# Patient Record
Sex: Female | Born: 1950
Health system: Southern US, Community
[De-identification: ages and names within clinical notes are randomized; demographics above are authoritative.]

## PROBLEM LIST (undated history)

## (undated) DIAGNOSIS — K219 Gastro-esophageal reflux disease without esophagitis: Secondary | ICD-10-CM

## (undated) DIAGNOSIS — T7840XA Allergy, unspecified, initial encounter: Secondary | ICD-10-CM

## (undated) DIAGNOSIS — L570 Actinic keratosis: Secondary | ICD-10-CM

## (undated) DIAGNOSIS — M722 Plantar fascial fibromatosis: Secondary | ICD-10-CM

## (undated) DIAGNOSIS — T8859XA Other complications of anesthesia, initial encounter: Secondary | ICD-10-CM

## (undated) DIAGNOSIS — I10 Essential (primary) hypertension: Secondary | ICD-10-CM

## (undated) DIAGNOSIS — R7303 Prediabetes: Secondary | ICD-10-CM

## (undated) DIAGNOSIS — C50919 Malignant neoplasm of unspecified site of unspecified female breast: Secondary | ICD-10-CM

## (undated) DIAGNOSIS — T4145XA Adverse effect of unspecified anesthetic, initial encounter: Secondary | ICD-10-CM

## (undated) DIAGNOSIS — J189 Pneumonia, unspecified organism: Secondary | ICD-10-CM

## (undated) DIAGNOSIS — K635 Polyp of colon: Secondary | ICD-10-CM

## (undated) DIAGNOSIS — F419 Anxiety disorder, unspecified: Secondary | ICD-10-CM

## (undated) DIAGNOSIS — K589 Irritable bowel syndrome without diarrhea: Secondary | ICD-10-CM

## (undated) DIAGNOSIS — M1990 Unspecified osteoarthritis, unspecified site: Secondary | ICD-10-CM

## (undated) DIAGNOSIS — G510 Bell's palsy: Principal | ICD-10-CM

## (undated) DIAGNOSIS — C801 Malignant (primary) neoplasm, unspecified: Secondary | ICD-10-CM

## (undated) DIAGNOSIS — R112 Nausea with vomiting, unspecified: Secondary | ICD-10-CM

## (undated) DIAGNOSIS — E785 Hyperlipidemia, unspecified: Secondary | ICD-10-CM

## (undated) DIAGNOSIS — Z9889 Other specified postprocedural states: Secondary | ICD-10-CM

## (undated) HISTORY — DX: Anxiety disorder, unspecified: F41.9

## (undated) HISTORY — DX: Gastro-esophageal reflux disease without esophagitis: K21.9

## (undated) HISTORY — PX: TENDON REPAIR: SHX5111

## (undated) HISTORY — DX: Allergy, unspecified, initial encounter: T78.40XA

## (undated) HISTORY — DX: Bell's palsy: G51.0

## (undated) HISTORY — DX: Actinic keratosis: L57.0

## (undated) HISTORY — DX: Malignant neoplasm of unspecified site of unspecified female breast: C50.919

## (undated) HISTORY — PX: TUBAL LIGATION: SHX77

## (undated) HISTORY — PX: MASTECTOMY, PARTIAL: SHX709

## (undated) HISTORY — DX: Hyperlipidemia, unspecified: E78.5

## (undated) HISTORY — DX: Malignant (primary) neoplasm, unspecified: C80.1

## (undated) HISTORY — DX: Pneumonia, unspecified organism: J18.9

## (undated) HISTORY — DX: Essential (primary) hypertension: I10

## (undated) HISTORY — PX: HERNIA REPAIR: SHX51

## (undated) HISTORY — PX: BREAST SURGERY: SHX581

## (undated) HISTORY — DX: Irritable bowel syndrome, unspecified: K58.9

## (undated) HISTORY — DX: Polyp of colon: K63.5

## (undated) HISTORY — PX: TONSILLECTOMY: SHX5217

## (undated) HISTORY — DX: Plantar fascial fibromatosis: M72.2

---
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
## 1988-11-27 HISTORY — PX: ABDOMINAL HYSTERECTOMY: SHX81

## 1999-02-04 ENCOUNTER — Encounter: Payer: Self-pay | Admitting: Neurological Surgery

## 1999-02-04 ENCOUNTER — Ambulatory Visit (HOSPITAL_COMMUNITY): Admission: RE | Admit: 1999-02-04 | Discharge: 1999-02-04 | Payer: Self-pay | Admitting: Neurological Surgery

## 1999-02-17 ENCOUNTER — Ambulatory Visit (HOSPITAL_COMMUNITY): Admission: RE | Admit: 1999-02-17 | Discharge: 1999-02-17 | Payer: Self-pay | Admitting: Neurological Surgery

## 1999-02-17 ENCOUNTER — Encounter: Payer: Self-pay | Admitting: Neurological Surgery

## 1999-03-11 ENCOUNTER — Ambulatory Visit (HOSPITAL_COMMUNITY): Admission: RE | Admit: 1999-03-11 | Discharge: 1999-03-11 | Payer: Self-pay | Admitting: Neurological Surgery

## 1999-03-11 ENCOUNTER — Encounter: Payer: Self-pay | Admitting: Neurological Surgery

## 1999-12-05 ENCOUNTER — Ambulatory Visit (HOSPITAL_COMMUNITY): Admission: RE | Admit: 1999-12-05 | Discharge: 1999-12-05 | Payer: Self-pay | Admitting: Gastroenterology

## 2000-03-06 ENCOUNTER — Ambulatory Visit (HOSPITAL_COMMUNITY): Admission: RE | Admit: 2000-03-06 | Discharge: 2000-03-06 | Payer: Self-pay | Admitting: Neurological Surgery

## 2000-03-06 ENCOUNTER — Encounter: Payer: Self-pay | Admitting: Neurological Surgery

## 2000-03-30 ENCOUNTER — Encounter: Payer: Self-pay | Admitting: Neurological Surgery

## 2000-03-30 ENCOUNTER — Ambulatory Visit (HOSPITAL_COMMUNITY): Admission: RE | Admit: 2000-03-30 | Discharge: 2000-03-30 | Payer: Self-pay | Admitting: Neurological Surgery

## 2000-04-13 ENCOUNTER — Ambulatory Visit (HOSPITAL_COMMUNITY): Admission: RE | Admit: 2000-04-13 | Discharge: 2000-04-13 | Payer: Self-pay | Admitting: Neurological Surgery

## 2000-04-13 ENCOUNTER — Encounter: Payer: Self-pay | Admitting: Neurological Surgery

## 2001-01-03 ENCOUNTER — Other Ambulatory Visit: Admission: RE | Admit: 2001-01-03 | Discharge: 2001-01-03 | Payer: Self-pay | Admitting: Obstetrics and Gynecology

## 2001-04-04 ENCOUNTER — Ambulatory Visit (HOSPITAL_COMMUNITY): Admission: RE | Admit: 2001-04-04 | Discharge: 2001-04-04 | Payer: Self-pay | Admitting: Orthopedic Surgery

## 2002-09-22 ENCOUNTER — Encounter (INDEPENDENT_AMBULATORY_CARE_PROVIDER_SITE_OTHER): Payer: Self-pay

## 2002-09-22 ENCOUNTER — Ambulatory Visit (HOSPITAL_COMMUNITY): Admission: RE | Admit: 2002-09-22 | Discharge: 2002-09-22 | Payer: Self-pay | Admitting: Orthopedic Surgery

## 2003-11-28 DIAGNOSIS — I10 Essential (primary) hypertension: Secondary | ICD-10-CM

## 2003-11-28 HISTORY — DX: Essential (primary) hypertension: I10

## 2004-02-29 ENCOUNTER — Other Ambulatory Visit: Admission: RE | Admit: 2004-02-29 | Discharge: 2004-02-29 | Payer: Self-pay | Admitting: Obstetrics and Gynecology

## 2004-12-12 ENCOUNTER — Ambulatory Visit (HOSPITAL_COMMUNITY): Admission: RE | Admit: 2004-12-12 | Discharge: 2004-12-12 | Payer: Self-pay | Admitting: Orthopedic Surgery

## 2005-05-16 ENCOUNTER — Ambulatory Visit (HOSPITAL_BASED_OUTPATIENT_CLINIC_OR_DEPARTMENT_OTHER): Admission: RE | Admit: 2005-05-16 | Discharge: 2005-05-16 | Payer: Self-pay | Admitting: Orthopedic Surgery

## 2005-05-16 ENCOUNTER — Ambulatory Visit (HOSPITAL_COMMUNITY): Admission: RE | Admit: 2005-05-16 | Discharge: 2005-05-16 | Payer: Self-pay | Admitting: Orthopedic Surgery

## 2007-04-10 ENCOUNTER — Emergency Department (HOSPITAL_COMMUNITY): Admission: EM | Admit: 2007-04-10 | Discharge: 2007-04-10 | Payer: Self-pay | Admitting: Emergency Medicine

## 2008-07-28 ENCOUNTER — Encounter (INDEPENDENT_AMBULATORY_CARE_PROVIDER_SITE_OTHER): Payer: Self-pay | Admitting: Urology

## 2008-07-28 ENCOUNTER — Ambulatory Visit (HOSPITAL_BASED_OUTPATIENT_CLINIC_OR_DEPARTMENT_OTHER): Admission: RE | Admit: 2008-07-28 | Discharge: 2008-07-28 | Payer: Self-pay | Admitting: Urology

## 2009-11-27 DIAGNOSIS — C4491 Basal cell carcinoma of skin, unspecified: Secondary | ICD-10-CM

## 2009-11-27 DIAGNOSIS — C801 Malignant (primary) neoplasm, unspecified: Secondary | ICD-10-CM

## 2009-11-27 HISTORY — DX: Malignant (primary) neoplasm, unspecified: C80.1

## 2009-11-27 HISTORY — PX: OTHER SURGICAL HISTORY: SHX169

## 2009-11-27 HISTORY — DX: Basal cell carcinoma of skin, unspecified: C44.91

## 2009-12-08 ENCOUNTER — Ambulatory Visit (HOSPITAL_BASED_OUTPATIENT_CLINIC_OR_DEPARTMENT_OTHER): Admission: RE | Admit: 2009-12-08 | Discharge: 2009-12-08 | Payer: Self-pay | Admitting: Orthopedic Surgery

## 2010-04-28 ENCOUNTER — Encounter: Admission: RE | Admit: 2010-04-28 | Discharge: 2010-04-28 | Payer: Self-pay | Admitting: Orthopedic Surgery

## 2011-02-11 LAB — POCT I-STAT 4, (NA,K, GLUC, HGB,HCT)
Hemoglobin: 15.3 g/dL — ABNORMAL HIGH (ref 12.0–15.0)
Potassium: 3.8 mEq/L (ref 3.5–5.1)
Sodium: 139 mEq/L (ref 135–145)

## 2011-04-11 NOTE — Op Note (Signed)
NAMEKIMBERLIN, [name]                ACCOUNT NO.:  [id number]   MEDICAL RECORD NO.:  [id number]          PATIENT TYPE:  AMB   LOCATION:  [facility]                         FACILITY:  [facility]   PHYSICIAN:  Jamison Neighbor, M.D.  DATE OF BIRTH:  05/20/1950   DATE OF PROCEDURE:  07/28/2008  DATE OF DISCHARGE:                               OPERATIVE REPORT   PREOPERATIVE DIAGNOSIS:  Interstitial cystitis/chronic pelvic pain.   POSTOPERATIVE DIAGNOSIS:  Interstitial cystitis/chronic pelvic pain.   PROCEDURE:  Cystoscopy, urethral calibration, hydrodistention of the  bladder, Marcaine and Pyridium installation, Marcaine and Kenalog  injection.   SURGEON:  Dr. Logan Bores.   ANESTHESIA:  Was general.   COMPLICATIONS:  None.   DRAINS:  None.   BRIEF HISTORY:  This 60 year old female presented to my office in  extreme pain, stating that she was nearly suicidal from her pain  problems.  She had been told she had interstitial cystitis, but the  diagnosis was somewhat in question because apparently she had a 1000 mL  capacity bladder on her hydrodistention.  The patient was given some  pain management as well as muscle relaxants, and it was recommended to  her that she return to psychiatrist for adjustment on her  antidepressants as we found evidence of pelvic floor dysfunction and  depression on her initial evaluation.  The patient is now to undergo  cystoscopic examination under anesthesia to see if there really is  evidence of IC and to determine the severity of the condition.  She  understands the risks and benefits of the procedure and gave full  informed consent.   PROCEDURE:  After successful induction of general anesthesia, the  patient was placed in the dorsal position, prepped with Betadine and  draped in the usual sterile fashion.  Careful bimanual examination  revealed good support for the urethra.  There was minimal cystocele.  The vault was well supported status post hysterectomy.   There was no  signs of vault prolapse or rectocele.  Urethra was calibrated to 24-  [place] with female urethral sounds without significant stenosis or  stricture.  The cystoscope was inserted.  The bladder was carefully  inspected.  No tumors or stones could be seen.  The urine coming out of  each ureter was orange tinged from Pyridium but otherwise normal.  Hydrodistention of the bladder was performed.  The bladder was distended  at a pressure of 100 cm of water for 5 minutes.  When the bladder was  drained, granulation could be seen throughout the bladder.  They were  minimal, however, and there was really no significant bleeding on the  drain-out cycle.  Bladder capacity is diminished at 650 mL, and bladder  biopsy was performed.  The biopsy site was cauterized.  This was sent  for mast cell analysis as well as to rule out carcinoma in situ.  The  bladder was drained.  A mixture of Marcaine and Pyridium was left in the  bladder.  Mixture of Marcaine and Kenalog was injected periurethrally.  The patient tolerated the procedure well and was taken to the  recovery  in good condition.  She was sent home with Tylox and doxycycline.  She  will continue on her Pyridium Plus as well as her other medications.  The patient's findings were reviewed with her and will determine whether  she would benefit from additional interstitial cystitis directed therapy  based on her response to  hydrodistention and the biopsy.  At this point, however, this appears to  be a fairly unremarkable looking bladder, and it is felt that the best  efforts might be directed towards pelvic floor management and muscle  relaxant therapy as well as chronic pain management.  True bladder  directed therapy may not be as helpful for this patient.      Jamison Neighbor, M.D.  Electronically Signed     RJE/MEDQ  D:  07/28/2008  T:  07/28/2008  Job:  [id number]

## 2011-04-14 NOTE — Op Note (Signed)
   NAME:  [name], [name]                           ACCOUNT NO.:  [id number]   MEDICAL RECORD NO.:  [id number]                   PATIENT TYPE:  AMB   LOCATION:  DAY                                  FACILITY:  [facility]   PHYSICIAN:  Marlowe Kays, MD                 DATE OF BIRTH:  May 13, 1951   DATE OF PROCEDURE:  09/22/2002  DATE OF DISCHARGE:                                 OPERATIVE REPORT   PREOPERATIVE DIAGNOSIS:  Painful mass dorsum PIP joint right long finger.   POSTOPERATIVE DIAGNOSIS:  Ganglion cyst with apparent mucinous degeneration  of extensor tendon, right long finger.   OPERATION:  Excision of ganglion cyst dorsum PIP joint right long finger  with debridement and repair of defect in extensor tendon.   SURGEON:  Marlowe Kays, M.D.   ASSISTANT:  Nurse.   ANESTHESIA:  IV regional.   PATHOLOGY AND JUSTIFICATION FOR PROCEDURE:  Mass has been present for  roughly five weeks, no known injury, it is painful. X-rays show no bony  erosion. See operative description below for additional details.   DESCRIPTION OF PROCEDURE:  Satisfactory IV regional anesthesia, duraprep to  fingers, hands and wrist, she was draped in a sterile field. I made a  slightly curved incision over the dorsal ulnar aspect of the PIP joint since  the mass was on this side. Once through the subcutaneous tissue, gelatinous  fluid consistent with a ganglion spontaneously came out. It appeared within  a little bit of a sac to the tissues in the dorsum of the extensor  mechanism. Using magnifying loops, I debrided this out and found a defect in  the tendon which appeared to be consistent with mucinous degeneration. After  both debriding and localizing the pathology, I then repaired the defect with  multiple interrupted 4-0 Vicryl. The skin and subcutaneous tissue were then  closed with interrupted 5-0 nylon mattress sutures. This incision was  infiltrated with 0.5% plain Marcaine. Betadine Adaptic dry  sterile dressing  were applied. The regional tourniquet was released. She tolerated the  procedure well and was taken to the recovery room in satisfactory condition  with no known complications.                                               Marlowe Kays, MD    JA/MEDQ  D:  09/22/2002  T:  09/22/2002  Job:  [id number]

## 2011-04-14 NOTE — Op Note (Signed)
[name], [name]                 ACCOUNT NO.:  [id number]   MEDICAL RECORD NO.:  [id number]          PATIENT TYPE:  OBV   LOCATION:  0098                         [facility]ACILITY:  [facility]   PHYSICIAN:  Marlowe Kays, M.D.  DATE OF BIRTH:  [birth year]   DATE OF PROCEDURE:  12/12/2004  DATE OF DISCHARGE:                                 OPERATIVE REPORT   PREOPERATIVE DIAGNOSIS:  Painful right little toe secondary to exuberant  fracture callus and prominent spur.   POSTOPERATIVE DIAGNOSIS:  Painful right little toe secondary to exuberant  fracture callus and prominent spur.   OPERATION:  Partial proximal and distal phalanges right little toe and  debridement of old nonabsorbable suture.   SURGEON:  Marlowe Kays, M.D.   ASSISTANT:  Nurse.   ANESTHESIA:  General.   JUSTIFICATION FOR PROCEDURE:  She has had pain and swelling over the lateral  right little toe.  This is a result of a fracture September, 2005.  X-rays  demonstrate exuberant callus and a prominent spur.  She is unable to wear  her shoes and wished to have the problem corrected.  She also has a history  of having had right toe surgery many years ago.   PROCEDURE:  After satisfactory general anesthesia, DuraPrep, the foot and  ankle was draped in a sterile field.  The leg was esmarched out after  elevation.  A dorsal lateral incision down through the capsule, the  prominent bony structures, which basically were the distal portion of the  proximal phalanx and the proximal portion of the distal phalanx, were  demarcated and excised with both sharp and small rongeur dissection until  the outer portion of the toe was flat.  Also, several large chunks of  nonabsorbable suture were removed which may have been a contributing factor  to the pain and tenderness she was having.  The wound was then irrigated  with sterile saline.  The toe was blocked with 0.5% plain Marcaine.  The  periosteal capsular complex was closed with  interrupted 4-0 Vicryl and skin  with interrupted 4-0 nylon mattress sutures.  Betadine, Adaptic, dry sterile  dressing were applied.  The tourniquet was released.  At the time of this  dictation, she was on her way to the recovery room in satisfactory condition  with no complications.      JA/MEDQ  D:  12/12/2004  T:  12/12/2004  Job:  [id number]

## 2011-04-14 NOTE — Op Note (Signed)
[facility]  Patient:    [name], [name]                        MRN: [id number] Proc. Date: 04/04/01 Adm. Date:  [id number] Attending:  Marlowe Kays Page                           Operative Report  PREOPERATIVE DIAGNOSIS:  Chronic plantar fasciitis, right heel.  POSTOPERATIVE DIAGNOSIS:  Chronic plantar fasciitis, right heel.  OPERATION PERFORMED:  Release plantar fascia, right heel.  SURGEON:  Dr. Simonne Come.  ASSISTANT:  Nurse.  ANESTHESIA:  General.  PATHOLOGY AND JUSTIFICATION FOR PROCEDURE:  Chronic and recently severe pain over the medial origin of the plantar fascia accompanied by a cavus foot, nonsurgical measures have been ineffective. Consequently she is here today for the above mentioned surgery.  DESCRIPTION OF PROCEDURE:  Satisfactory general anesthesia, pneumatic tourniquet, foot and ankle prepped with duraprep, draped in a sterile field. A 2 cm incision was made over the posterior instep slightly overlappig the os calcis. The incision was carried down through the subcutaneous tissue and the plantar fascia exposed. It was demarcated from the underlying soft tissues with a retractor and I then with sharp dissection demarcated the superior portion of the plantar fascia from the overlying muscle. With scissors I then released the plantar fascia across the width of the heel. In many areas, there was a very audible gritty noise as well as sensation indicating chronic fibrosis. In the mid portion, there appeared to have been some detachment consistent with the severe pain and tenderness in this area. Once I felt I had released the fascia across the heel both with direct visualization and by inserting my little finger and feeling, the wound was irrigated with sterile saline and soft tissue was infiltrated with 0.5% plain Marcaine. The subcutaneous tissue was reapproximated with interrupted 3-0 Vicryl and the skin was interrupted 4-0 nylon  mattress sutures. Betadine Adaptic dry sterile dressing were applied, tourniquet was released. At the time of this dictation she was doing well and was on her way to the recovery room in satisfactory condition with no known complications. DD:  04/04/01 TD:  04/04/01 Job: [id number]

## 2011-04-14 NOTE — Op Note (Signed)
[name], [name]                 ACCOUNT NO.:  [id number]   MEDICAL RECORD NO.:  [id number]          PATIENT TYPE:  AMB   LOCATION:  [facility]                         [facility]ACILITY:  [facility]   PHYSICIAN:  Marlowe Kays, M.D.  DATE OF BIRTH:  [birth year]   DATE OF PROCEDURE:  05/16/2005  DATE OF DISCHARGE:                                 OPERATIVE REPORT   PREOPERATIVE DIAGNOSIS:  Painful callus distal portion, right little toe.   POSTOPERATIVE DIAGNOSIS:  Painful callus distal portion, right little toe.   OPERATION:  Partial distal phalangectomy right little toe.   SURGEON:  Dr. Simonne Come   ASSISTANT:  Nurse.   ANESTHESIA:  Local MAC.   PATHOLOGY AND JUSTIFICATION FOR PROCEDURE:  I had previously performed a  partial phalangectomy of mainly her proximal phalanx.  This was done well,  but distal and dorsal she now has a callus with pain there as well, and she  is here today for removal of additional bone distal to the prior surgical  area.   DESCRIPTION OF PROCEDURE:  MAC anesthesia with DuraPrep to foot and ankle,  was draped in a sterile field.  I blocked her toe and worked locally with  0.5% plain Marcaine.  I marked out a lateral incision distal to the prior  incision, working my way down to the underlying bony prominence which was  basically the base of the distal phalanx.  I dissected this out dorsally and  inferiorly and a small rongeurs removed sufficient bone that the phalanx was  now flat with no bony projections.  The wound was irrigated with sterile  saline.  I closed the periosteal fascial complex with interrupted 4-0 Vicryl  and skin with interrupted 4-0 nylon mattress sutures.  Betadine, Adaptic dry  sterile dressing were applied.  She tolerated the procedure well and was  taken to the recovery room in satisfactory condition with no known  complications.       JA/MEDQ  D:  05/16/2005  T:  05/16/2005  Job:  [id number]

## 2011-04-14 NOTE — Procedures (Signed)
[facility]  Patient:    [name]                        MRN: [id number] Proc. Date: 12/05/99 Adm. Date:  [id number] Attending:  Nelda Marseille CC:         Petra Kuba, M.D.  (Office)             Roxy Manns, M.D. [facility]             Sherry A. Rosalio Macadamia, M.D.                           Procedure Report  NAME OF PROCEDURE: Colonoscopy.  ENDOSCOPIST: Petra Kuba, M.D.  INDICATIONS: Patient with a personal history of colon polyps here for repeat screening.  CONSENT:  Consent was signed after risks, benefits, methods and options were thoroughly discussed multiple times in the past.  MEDICINES USED:  Demerol 150 mg and Versed 15 mg.  DESCRIPTION OF PROCEDURE: Rectal inspection was pertinent for small external hemorrhoids.  Digital examination is negative.  The pediatric video colonoscope was inserted and easily advanced around the colon to the cecum.  This did require some left lower quadrant pressure, but no position changes.  The cecum was identified by the appendiceal orifice and the ileocecal  valve.  Prep on the right side was fairly adequate.  The rest of the prep was adequate.  We did have to perform some washing and suctioning to remove the stool that was on the wall of the right colon.  On insertion of the instrument there were left-sided diverticula were noted. No other abnormalities were seen.  The scope was slowly withdrawn.  On slow withdrawal through the colon no polypoid lesions, masses or other abnormalities, but the left-sided diverticula were seen.  This was slowly withdrawn to the rectum. Once back in the rectum the scope was retroflexed pertinent for some internal hemorrhoids.  Anorectal pull through confirmed the hemorrhoids.  The scope was reinserted to probably the sigmoid descending junction.  Air was suctioned.  The scope was removed.  The patient tolerated the procedure well.  There were no obvious  immediate complications.  ENDOSCOPIC DIAGNOSES: 1. Internal and external hemorrhoids, small. 2. Early left-sided diverticula. 3. Otherwise within normal limits to the cecum.  PLAN: 1. Yearly rectal examination per either Dr. Milinda Antis or Dr. Rosalio Macadamia. 2. I would be happy to see back soon p.r.n., but otherwise will probably    recheck colonoscopy in five years. DD:  12/05/99 TD:  12/05/99 Job: [id number]

## 2011-05-11 ENCOUNTER — Other Ambulatory Visit (HOSPITAL_COMMUNITY): Payer: Self-pay | Admitting: Orthopedic Surgery

## 2011-05-11 DIAGNOSIS — M79671 Pain in right foot: Secondary | ICD-10-CM

## 2011-05-11 DIAGNOSIS — M84376A Stress fracture, unspecified foot, initial encounter for fracture: Secondary | ICD-10-CM

## 2011-05-18 ENCOUNTER — Other Ambulatory Visit (HOSPITAL_COMMUNITY): Payer: Self-pay

## 2011-05-18 ENCOUNTER — Encounter (HOSPITAL_COMMUNITY): Payer: Self-pay

## 2013-11-10 ENCOUNTER — Encounter: Payer: Self-pay | Admitting: Emergency Medicine

## 2013-11-10 ENCOUNTER — Ambulatory Visit (INDEPENDENT_AMBULATORY_CARE_PROVIDER_SITE_OTHER): Payer: BC Managed Care – PPO | Admitting: Emergency Medicine

## 2013-11-10 VITALS — BP 136/94 | HR 88 | Temp 98.0°F | Resp 18 | Wt 174.0 lb

## 2013-11-10 DIAGNOSIS — J329 Chronic sinusitis, unspecified: Secondary | ICD-10-CM

## 2013-11-10 DIAGNOSIS — R05 Cough: Secondary | ICD-10-CM

## 2013-11-10 DIAGNOSIS — I10 Essential (primary) hypertension: Secondary | ICD-10-CM | POA: Insufficient documentation

## 2013-11-10 LAB — CBC WITH DIFFERENTIAL/PLATELET
Basophils Relative: 1 % (ref 0–1)
Eosinophils Absolute: 0.3 10*3/uL (ref 0.0–0.7)
Eosinophils Relative: 3 % (ref 0–5)
Hemoglobin: 15.2 g/dL — ABNORMAL HIGH (ref 12.0–15.0)
MCH: 30.3 pg (ref 26.0–34.0)
MCHC: 35.6 g/dL (ref 30.0–36.0)
MCV: 85.2 fL (ref 78.0–100.0)
Monocytes Absolute: 0.5 10*3/uL (ref 0.1–1.0)
Monocytes Relative: 6 % (ref 3–12)
Neutrophils Relative %: 66 % (ref 43–77)

## 2013-11-10 LAB — BASIC METABOLIC PANEL WITH GFR
BUN: 16 mg/dL (ref 6–23)
Calcium: 10 mg/dL (ref 8.4–10.5)
Creat: 0.86 mg/dL (ref 0.50–1.10)
GFR, Est African American: 84 mL/min
Glucose, Bld: 95 mg/dL (ref 70–99)
Sodium: 134 mEq/L — ABNORMAL LOW (ref 135–145)

## 2013-11-10 MED ORDER — METHYLPREDNISOLONE (PAK) 4 MG PO TABS: 4.0000 mg | ORAL_TABLET | Freq: Every day | ORAL | Status: DC

## 2013-11-10 MED ORDER — AZITHROMYCIN 250 MG PO TABS: ORAL_TABLET | ORAL | Status: DC

## 2013-11-10 MED ORDER — BENZONATATE 100 MG PO CAPS: 100.0000 mg | ORAL_CAPSULE | Freq: Three times a day (TID) | ORAL | Status: DC | PRN

## 2013-11-10 MED ORDER — ALBUTEROL SULFATE HFA 108 (90 BASE) MCG/ACT IN AERS: 2.0000 | INHALATION_SPRAY | Freq: Four times a day (QID) | RESPIRATORY_TRACT | Status: DC | PRN

## 2013-11-10 MED ORDER — DEXAMETHASONE SODIUM PHOSPHATE 100 MG/10ML IJ SOLN
10.0000 mg | Freq: Once | INTRAMUSCULAR | Status: AC
  Administered 2013-11-10: 10 mg via INTRAMUSCULAR

## 2013-11-10 NOTE — Patient Instructions (Signed)
Sinusitis Sinusitis is redness, soreness, and puffiness (inflammation) of the air pockets in the bones of your face (sinuses). The redness, soreness, and puffiness can cause air and mucus to get trapped in your sinuses. This can allow germs to grow and cause an infection.  HOME CARE   Drink enough fluids to keep your pee (urine) clear or pale yellow.  Use a humidifier in your home.  Run a hot shower to create steam in the bathroom. Sit in the bathroom with the door closed. Breathe in the steam 3 4 times a day.  Put a warm, moist washcloth on your face 3 4 times a day, or as told by your doctor.  Use salt water sprays (saline sprays) to wet the thick fluid in your nose. This can help the sinuses drain.  Only take medicine as told by your doctor. GET HELP RIGHT AWAY IF:   Your pain gets worse.  You have very bad headaches.  You are sick to your stomach (nauseous).  You throw up (vomit).  You are very sleepy (drowsy) all the time.  Your face is puffy (swollen).  Your vision changes.  You have a stiff neck.  You have trouble breathing. MAKE SURE YOU:   Understand these instructions.  Will watch your condition.  Will get help right away if you are not doing well or get worse. Document Released: 05/01/2008 Document Revised: 08/07/2012 Document Reviewed: 06/18/2012 [facility] Patient Information 2014 [place], [place]. Bronchitis Bronchitis is a problem of the air tubes leading to your lungs. This problem makes it hard for air to get in and out of the lungs. You may cough a lot because your air tubes are narrow. Going without care can cause lasting (chronic) bronchitis. HOME CARE   Drink enough fluids to keep your pee (urine) clear or pale yellow.  Use a cool mist humidifier.  Quit smoking if you smoke. If you keep smoking, the bronchitis might not get better.  Only take medicine as told by your doctor. GET HELP RIGHT AWAY IF:   Coughing keeps you awake.  You start to  wheeze.  You become more sick or weak.  You have a hard time breathing or get short of breath.  You cough up blood.  Coughing lasts more than 2 weeks.  You have a fever.  Your baby is older than 3 months with a rectal temperature of 102 F (38.9 C) or higher.  Your baby is 51 months old or younger with a rectal temperature of 100.4 F (38 C) or higher. MAKE SURE YOU:  Understand these instructions.  Will watch your condition.  Will get help right away if you are not doing well or get worse. Document Released: 05/01/2008 Document Revised: 02/05/2012 Document Reviewed: 07/08/2013 [facility] Patient Information 2014 [place], [place].

## 2013-11-11 ENCOUNTER — Encounter: Payer: Self-pay | Admitting: Emergency Medicine

## 2013-11-11 ENCOUNTER — Telehealth: Payer: Self-pay | Admitting: *Deleted

## 2013-11-11 MED ORDER — TRIAMTERENE-HCTZ 37.5-25 MG PO TABS: 1.0000 | ORAL_TABLET | Freq: Every day | ORAL | Status: DC

## 2013-11-11 NOTE — Telephone Encounter (Signed)
Patient called requesting to be out of work another day d/t cold symptoms, cough and fatigue.  Per Loree Fee, PA-C, ok to fax out of work note to [phone] Attn: Art Buff, for patient to return to work 11/13/13.  Patient aware of lab results and instructions.

## 2013-11-11 NOTE — Telephone Encounter (Signed)
Message copied by Nicholaus Corolla A on Tue Nov 11, 2013 10:57 AM ------      Message from: [place], [place] R      Created: Tue Nov 11, 2013  2:51 AM       Sodium chloride a little low may be from BP prescription try to decrease by 1/2 and call if BP > 130/80. Rest ok recheck at 01/13/14 ------

## 2013-11-11 NOTE — Progress Notes (Signed)
   Subjective:    Patient ID: [name], female    DOB: 12-21-1950, 62 y.o.   MRN: [id number]  HPI Comments: 62 yo female over due for HTN evaluation. She notes BP good at home. She has been getting RX filled at [facility]. She exercise with keeping active.  She has had increasing cough x 10 days and now has head congestion and production of color. She denies relief with OTC.  Sore Throat  Associated symptoms include congestion and coughing.    MEDICATIONS ESTRADIOL .025 MAXZIDE 37.5/25  Review of patient's allergies indicates no known allergies.  No past medical history on file.   Review of Systems  HENT: Positive for congestion, postnasal drip and sinus pressure.   Respiratory: Positive for cough.   All other systems reviewed and are negative.   BP 136/94  Pulse 88  Temp(Src) 98 F (36.7 C) (Temporal)  Resp 18  Wt 174 lb (78.926 kg)     Objective:   Physical Exam  Nursing note and vitals reviewed. Constitutional: She is oriented to person, place, and time. She appears well-developed and well-nourished. No distress.  HENT:  Head: Normocephalic and atraumatic.  Right Ear: External ear normal.  Left Ear: External ear normal.  Nose: Nose normal.  Mouth/Throat: Oropharynx is clear and moist. No oropharyngeal exudate.  TMs injected, maxillary tenderness  Eyes: Conjunctivae and EOM are normal.  Neck: Normal range of motion. Neck supple. No JVD present. No thyromegaly present.  Cardiovascular: Normal rate, regular rhythm, normal heart sounds and intact distal pulses.   Pulmonary/Chest: Effort normal and breath sounds normal.  Tight barky spasmatic cough  Abdominal: Soft. Bowel sounds are normal. She exhibits no distension and no mass. There is no tenderness. There is no rebound and no guarding.  Musculoskeletal: Normal range of motion. She exhibits no edema and no tenderness.  Lymphadenopathy:    She has no cervical adenopathy.  Neurological: She is alert and oriented to  person, place, and time. No cranial nerve deficit.  Skin: Skin is warm and dry. No rash noted. No erythema. No pallor.  Psychiatric: She has a normal mood and affect. Her behavior is normal. Judgment and thought content normal.          Assessment & Plan:  1. Sinusitis, Cugh, Allergic Rhinitis- Zpak, Proventil, Tessalon perles, Dexamethasone 10 mg ALL AD, if no change add MEdrol DP 4mg . Add Allegra OTC, increase H2o. 2. HTN- check labs refill RX, Needs CPE.

## 2014-01-13 ENCOUNTER — Encounter: Payer: Self-pay | Admitting: Emergency Medicine

## 2014-02-05 ENCOUNTER — Encounter: Payer: Self-pay | Admitting: Emergency Medicine

## 2014-03-09 DIAGNOSIS — Z9109 Other allergy status, other than to drugs and biological substances: Secondary | ICD-10-CM | POA: Insufficient documentation

## 2014-03-09 DIAGNOSIS — M722 Plantar fascial fibromatosis: Secondary | ICD-10-CM | POA: Insufficient documentation

## 2014-03-09 DIAGNOSIS — E785 Hyperlipidemia, unspecified: Secondary | ICD-10-CM | POA: Insufficient documentation

## 2014-03-09 DIAGNOSIS — F419 Anxiety disorder, unspecified: Secondary | ICD-10-CM | POA: Insufficient documentation

## 2014-03-10 ENCOUNTER — Ambulatory Visit (INDEPENDENT_AMBULATORY_CARE_PROVIDER_SITE_OTHER): Payer: BC Managed Care – PPO | Admitting: Emergency Medicine

## 2014-03-10 ENCOUNTER — Encounter: Payer: Self-pay | Admitting: Emergency Medicine

## 2014-03-10 VITALS — BP 150/84 | HR 100 | Temp 98.2°F | Resp 18 | Ht 68.0 in | Wt 170.0 lb

## 2014-03-10 DIAGNOSIS — I10 Essential (primary) hypertension: Secondary | ICD-10-CM

## 2014-03-10 DIAGNOSIS — S91209A Unspecified open wound of unspecified toe(s) with damage to nail, initial encounter: Secondary | ICD-10-CM

## 2014-03-10 MED ORDER — TRIAMTERENE-HCTZ 37.5-25 MG PO TABS: 1.0000 | ORAL_TABLET | Freq: Every day | ORAL | Status: DC

## 2014-03-10 NOTE — Patient Instructions (Signed)

## 2014-03-10 NOTE — Progress Notes (Signed)
   Subjective:    Patient ID: [name], female    DOB: December 10, 1950, 63 y.o.   MRN: [id number]  HPI Comments: 64 yo female noticed on Saturday when removed polish Left 3rd toe nail was raised  and discolored. She then noticed nail was broken. She denies trauma or previous fungus. She denies discoloration or thickening of any other nails. She was rushed for appointment and thinks that may have contributed to BP elevation. She notes it has been good at home.    Current Outpatient Prescriptions on File Prior to Visit  Medication Sig Dispense Refill  . estradiol (CLIMARA - DOSED IN MG/24 HR) 0.025 mg/24hr patch Place 0.025 mg onto the skin once a week.      . triamterene-hydrochlorothiazide (MAXZIDE-25) 37.5-25 MG per tablet Take 1 tablet by mouth daily.  30 tablet  3   No current facility-administered medications on file prior to visit.   No Known Allergies Past Medical History  Diagnosis Date  . Hypertension   . Hyperlipidemia   . Allergy   . Anxiety   . Plantar fasciitis      Review of Systems  Skin: Positive for color change.  All other systems reviewed and are negative.  BP 150/84  Pulse 100  Temp(Src) 98.2 F (36.8 C) (Temporal)  Resp 18  Ht 5\' 8"  (1.727 m)  Wt 170 lb (77.111 kg)  BMI 25.85 kg/m2 Recheck 140/84    Objective:   Physical Exam  Nursing note and vitals reviewed. Constitutional: She is oriented to person, place, and time. She appears well-developed and well-nourished.  HENT:  Head: Normocephalic and atraumatic.  Eyes: Conjunctivae are normal.  Neck: Normal range of motion.  Cardiovascular: Normal rate, regular rhythm, normal heart sounds and intact distal pulses.   Pulmonary/Chest: Effort normal and breath sounds normal.  Abdominal: Soft. Bowel sounds are normal. There is no tenderness.  Musculoskeletal: Normal range of motion.  Neurological: She is alert and oriented to person, place, and time.  Skin: Skin is warm and dry.  All nails with mild  staining from polish. Left 3 rd nail broken in half at mid to distal portion. No thickening or discoloration present.   Psychiatric: She has a normal mood and affect. Judgment normal.          Assessment & Plan:  1. Probable nail trauma vs fungus- Broken nail removed in office, w/c if any symptom of fungus occurs after explanation given for RX topical. Advised epsom salt soaks and vicks vapor rub x 2 weeks for prophylaxis  2. HTN- Check BP call if >130/80, increase cardio

## 2014-03-20 ENCOUNTER — Encounter: Payer: Self-pay | Admitting: Emergency Medicine

## 2014-04-29 ENCOUNTER — Encounter: Payer: Self-pay | Admitting: Emergency Medicine

## 2014-05-14 ENCOUNTER — Encounter: Payer: Self-pay | Admitting: Emergency Medicine

## 2014-05-14 ENCOUNTER — Ambulatory Visit (INDEPENDENT_AMBULATORY_CARE_PROVIDER_SITE_OTHER): Payer: BC Managed Care – PPO | Admitting: Emergency Medicine

## 2014-05-14 VITALS — BP 138/86 | HR 84 | Temp 98.6°F | Resp 18 | Ht 67.0 in | Wt 165.0 lb

## 2014-05-14 DIAGNOSIS — Z23 Encounter for immunization: Secondary | ICD-10-CM

## 2014-05-14 DIAGNOSIS — I10 Essential (primary) hypertension: Secondary | ICD-10-CM

## 2014-05-14 DIAGNOSIS — Z111 Encounter for screening for respiratory tuberculosis: Secondary | ICD-10-CM

## 2014-05-14 DIAGNOSIS — Z1212 Encounter for screening for malignant neoplasm of rectum: Secondary | ICD-10-CM

## 2014-05-14 DIAGNOSIS — G479 Sleep disorder, unspecified: Secondary | ICD-10-CM

## 2014-05-14 DIAGNOSIS — E782 Mixed hyperlipidemia: Secondary | ICD-10-CM

## 2014-05-14 DIAGNOSIS — Z0000 Encounter for general adult medical examination without abnormal findings: Secondary | ICD-10-CM

## 2014-05-14 DIAGNOSIS — K5900 Constipation, unspecified: Secondary | ICD-10-CM

## 2014-05-14 LAB — CBC WITH DIFFERENTIAL/PLATELET
BASOS PCT: 1 % (ref 0–1)
Basophils Absolute: 0.1 10*3/uL (ref 0.0–0.1)
EOS ABS: 0.2 10*3/uL (ref 0.0–0.7)
Eosinophils Relative: 4 % (ref 0–5)
HCT: 42.5 % (ref 36.0–46.0)
Hemoglobin: 15.5 g/dL — ABNORMAL HIGH (ref 12.0–15.0)
Lymphocytes Relative: 41 % (ref 12–46)
Lymphs Abs: 2.2 10*3/uL (ref 0.7–4.0)
MCH: 30.5 pg (ref 26.0–34.0)
MCHC: 36.5 g/dL — AB (ref 30.0–36.0)
MCV: 83.5 fL (ref 78.0–100.0)
MONOS PCT: 5 % (ref 3–12)
Monocytes Absolute: 0.3 10*3/uL (ref 0.1–1.0)
NEUTROS PCT: 49 % (ref 43–77)
Neutro Abs: 2.6 10*3/uL (ref 1.7–7.7)
Platelets: 171 10*3/uL (ref 150–400)
RBC: 5.09 MIL/uL (ref 3.87–5.11)
RDW: 14.1 % (ref 11.5–15.5)
WBC: 5.4 10*3/uL (ref 4.0–10.5)

## 2014-05-14 LAB — HEMOGLOBIN A1C
HEMOGLOBIN A1C: 5.7 % — AB (ref <5.7)
Mean Plasma Glucose: 117 mg/dL — ABNORMAL HIGH (ref <117)

## 2014-05-14 MED ORDER — ALPRAZOLAM 0.25 MG PO TABS: 0.2500 mg | ORAL_TABLET | Freq: Every evening | ORAL | Status: AC | PRN

## 2014-05-14 NOTE — Patient Instructions (Signed)

## 2014-05-14 NOTE — Progress Notes (Signed)
Subjective:    Patient ID: [name], female    DOB: 1951-05-02, 63 y.o.   MRN: [id number]  HPI Comments: 63 yo CPE and presents for F/U for HTN, Cholesterol, Pre-Dm, D. Deficient. She is doing well overall. She is trying to improve diet and exercise. She notes BP good at home.  She notes she does not sleep well with mind spinning. She is under mild stress but is dealing with it without RX. She has not tried any OTC. She is staying fatigued due sleeping spurts.  She has been having some difficulty with constipation and thinks she may have rectocele. She notes occasionally has to put thumb in vagina to help with BM. She denies pain/ blood with BM.  LABS 09/2013 ABN TG 239 HDL 34 A1c 5.9  Hypertension     Medication List       This list is accurate as of: 05/14/14 11:59 PM.  Always use your most recent med list.               ALPRAZolam 0.25 MG tablet  Commonly known as:  XANAX  Take 1 tablet (0.25 mg total) by mouth at bedtime as needed for anxiety.     estradiol 0.025 mg/24hr patch  Commonly known as:  CLIMARA - Dosed in mg/24 hr  Place 0.025 mg onto the skin once a week.     triamterene-hydrochlorothiazide 37.5-25 MG per tablet  Commonly known as:  MAXZIDE-25  Take 1 tablet by mouth daily.       No Known Allergies  Past Medical History  Diagnosis Date  . Hyperlipidemia   . Allergy   . Anxiety   . Plantar fasciitis   . Hypertension 2005  . Cancer 2011    [facility]   Past Surgical History  Procedure Laterality Date  . Abdominal hysterectomy  1990  . Tonsillectomy    . Tendon repair      plantar fasciitis  . Basal cell carcinoma  2011    nose   History  Substance Use Topics  . Smoking status: Never Smoker   . Smokeless tobacco: Not on file  . Alcohol Use: Not on file   Family History  Problem Relation Age of Onset  . Hypertension Mother   . Heart attack Mother   . Hyperlipidemia Mother   . Heart disease Mother 37    fatal MI  . Stroke Mother   .  Arthritis Mother     rheumatoid  . Cancer Father     lung  . Hypertension Brother   . Cancer Brother     throat, leukemia  . Parkinson's disease Brother     MAINTENANCE: Colonoscopy:2011 Mammo:09/08/13 @ [facility] WNL BMD: 2010 WNL Pap/ Pelvic:Hyst 2010 WNL EYE:WNL 2015 with contacts Dentist: Q 6 month  IMMUNIZATIONS: Tdap: Pneumovax: Zostavax: 2015 Influenza: allergy   Patient Care Team: Unk Pinto, MD as PCP - General (Internal Medicine) Avon Gully, NP as Nurse Practitioner (Obstetrics and Gynecology) Jeryl Columbia, MD as Consulting Physician (Gastroenterology) Magnus Sinning, MD as Consulting Physician (Orthopedic Surgery) Cindee Salt, MD as Consulting Physician (Physical Medicine and Rehabilitation) Jari Pigg, MD as Consulting Physician (Dermatology) [place])  Review of Systems  Constitutional: Positive for fatigue.  Gastrointestinal: Positive for constipation.  Psychiatric/Behavioral: Positive for sleep disturbance.  All other systems reviewed and are negative.  BP 138/86  Pulse 84  Temp(Src) 98.6 F (37 C) (Temporal)  Resp 18  Ht 5\' 7"  (1.702 m)  Wt 165  lb (74.844 kg)  BMI 25.84 kg/m2     Objective:   Physical Exam  Nursing note and vitals reviewed. Constitutional: She is oriented to person, place, and time. She appears well-developed and well-nourished. No distress.  HENT:  Head: Normocephalic and atraumatic.  Right Ear: External ear normal.  Left Ear: External ear normal.  Nose: Nose normal.  Mouth/Throat: Oropharynx is clear and moist. No oropharyngeal exudate.  Eyes: Conjunctivae and EOM are normal. Pupils are equal, round, and reactive to light. Right eye exhibits no discharge. Left eye exhibits no discharge. No scleral icterus.  Neck: Normal range of motion. Neck supple. No JVD present. No tracheal deviation present. No thyromegaly present.  Cardiovascular: Normal rate, regular rhythm, normal heart sounds and intact distal  pulses.   Pulmonary/Chest: Effort normal and breath sounds normal.  Abdominal: Soft. Bowel sounds are normal. She exhibits no distension and no mass. There is no tenderness. There is no rebound and no guarding.  Genitourinary:  Breasts WNL, PAP/ Manual deferred  Musculoskeletal: Normal range of motion. She exhibits no edema and no tenderness.  Lymphadenopathy:    She has no cervical adenopathy.  Neurological: She is alert and oriented to person, place, and time. She has normal reflexes. No cranial nerve deficit. She exhibits normal muscle tone. Coordination normal.  Skin: Skin is warm and dry. No rash noted. No erythema. No pallor.  Psychiatric: She has a normal mood and affect. Her behavior is normal. Judgment and thought content normal.      AORTA SCAN WNL EKG NSCSPT     Assessment & Plan:  1. CPE- Update screening labs/ History/ Immunizations/ Testing as needed. Advised healthy diet, QD exercise, increase H20 and continue RX/ Vitamins AD.  2. 3 month F/U for HTN, Cholesterol, Pre-Dm, D. Deficient. Needs healthy diet, cardio QD and obtain healthy weight. Check Labs, Check BP if >130/80 call office   3. Fatigue vs ? Sleep disturbance vs sleep apnea- Trial of xanax at [facility] to see if improves, w/c with results, check labs.Discussed sleep hygiene.  4. Constipation/ rectocele- Advise of increased fiber diet and need for evaluation at GYN to determine if further treatment surgically is warranted. She has appointment.

## 2014-05-15 LAB — URINALYSIS, ROUTINE W REFLEX MICROSCOPIC
Bilirubin Urine: NEGATIVE
Glucose, UA: NEGATIVE mg/dL
Hgb urine dipstick: NEGATIVE
Ketones, ur: NEGATIVE mg/dL
LEUKOCYTES UA: NEGATIVE
NITRITE: NEGATIVE
PH: 7.5 (ref 5.0–8.0)
Protein, ur: NEGATIVE mg/dL
SPECIFIC GRAVITY, URINE: 1.019 (ref 1.005–1.030)
Urobilinogen, UA: 0.2 mg/dL (ref 0.0–1.0)

## 2014-05-15 LAB — LIPID PANEL
CHOL/HDL RATIO: 5.8 ratio
Cholesterol: 210 mg/dL — ABNORMAL HIGH (ref 0–200)
HDL: 36 mg/dL — AB (ref >39)
Triglycerides: 481 mg/dL — ABNORMAL HIGH (ref <150)

## 2014-05-15 LAB — IRON AND TIBC
%SAT: 21 % (ref 20–55)
Iron: 83 ug/dL (ref 42–145)
TIBC: 398 ug/dL (ref 250–470)
UIBC: 315 ug/dL (ref 125–400)

## 2014-05-15 LAB — BASIC METABOLIC PANEL WITH GFR
BUN: 15 mg/dL (ref 6–23)
CALCIUM: 9.4 mg/dL (ref 8.4–10.5)
CO2: 28 mEq/L (ref 19–32)
Chloride: 99 mEq/L (ref 96–112)
Creat: 0.8 mg/dL (ref 0.50–1.10)
GFR, EST NON AFRICAN AMERICAN: 79 mL/min
GLUCOSE: 94 mg/dL (ref 70–99)
Potassium: 3.5 mEq/L (ref 3.5–5.3)
SODIUM: 139 meq/L (ref 135–145)

## 2014-05-15 LAB — MAGNESIUM: Magnesium: 1.9 mg/dL (ref 1.5–2.5)

## 2014-05-15 LAB — TSH: TSH: 1.083 u[IU]/mL (ref 0.350–4.500)

## 2014-05-15 LAB — HEPATIC FUNCTION PANEL
ALK PHOS: 74 U/L (ref 39–117)
ALT: 14 U/L (ref 0–35)
AST: 17 U/L (ref 0–37)
Albumin: 4.4 g/dL (ref 3.5–5.2)
BILIRUBIN DIRECT: 0.1 mg/dL (ref 0.0–0.3)
BILIRUBIN INDIRECT: 0.7 mg/dL (ref 0.2–1.2)
BILIRUBIN TOTAL: 0.8 mg/dL (ref 0.2–1.2)
Total Protein: 7.5 g/dL (ref 6.0–8.3)

## 2014-05-15 LAB — FOLATE RBC: RBC Folate: 196 ng/mL — ABNORMAL LOW (ref >280)

## 2014-05-15 LAB — VITAMIN B12: Vitamin B-12: 245 pg/mL (ref 211–911)

## 2014-05-15 LAB — INSULIN, FASTING: INSULIN FASTING, SERUM: 14 u[IU]/mL (ref 3–28)

## 2014-05-15 LAB — VITAMIN D 25 HYDROXY (VIT D DEFICIENCY, FRACTURES): Vit D, 25-Hydroxy: 31 ng/mL (ref 30–89)

## 2014-05-15 LAB — MICROALBUMIN / CREATININE URINE RATIO
CREATININE, URINE: 101.6 mg/dL
MICROALB UR: 0.5 mg/dL (ref 0.00–1.89)
MICROALB/CREAT RATIO: 4.9 mg/g (ref 0.0–30.0)

## 2014-05-18 LAB — TB SKIN TEST
INDURATION: 0 mm
TB Skin Test: NEGATIVE

## 2014-05-19 ENCOUNTER — Encounter: Payer: Self-pay | Admitting: Emergency Medicine

## 2014-06-15 ENCOUNTER — Encounter: Payer: Self-pay | Admitting: Emergency Medicine

## 2014-10-17 ENCOUNTER — Encounter: Payer: Self-pay | Admitting: *Deleted

## 2014-11-23 ENCOUNTER — Encounter: Payer: Self-pay | Admitting: Physician Assistant

## 2014-11-23 ENCOUNTER — Ambulatory Visit (INDEPENDENT_AMBULATORY_CARE_PROVIDER_SITE_OTHER): Payer: BC Managed Care – PPO | Admitting: Physician Assistant

## 2014-11-23 VITALS — BP 152/98 | HR 82 | Temp 97.8°F | Resp 18 | Ht 67.0 in | Wt 166.0 lb

## 2014-11-23 DIAGNOSIS — R110 Nausea: Secondary | ICD-10-CM

## 2014-11-23 DIAGNOSIS — M791 Myalgia, unspecified site: Secondary | ICD-10-CM

## 2014-11-23 DIAGNOSIS — J0100 Acute maxillary sinusitis, unspecified: Secondary | ICD-10-CM

## 2014-11-23 DIAGNOSIS — I10 Essential (primary) hypertension: Secondary | ICD-10-CM

## 2014-11-23 LAB — POCT INFLUENZA A/B: INFLUENZA A, POC: NEGATIVE

## 2014-11-23 MED ORDER — DEXAMETHASONE SODIUM PHOSPHATE 100 MG/10ML IJ SOLN
10.0000 mg | Freq: Once | INTRAMUSCULAR | Status: AC
  Administered 2014-11-23: 10 mg via INTRAMUSCULAR

## 2014-11-23 MED ORDER — TRIAMTERENE-HCTZ 37.5-25 MG PO TABS: 1.0000 | ORAL_TABLET | Freq: Every day | ORAL | Status: DC

## 2014-11-23 MED ORDER — LEVOFLOXACIN 500 MG PO TABS: 500.0000 mg | ORAL_TABLET | Freq: Every day | ORAL | Status: DC

## 2014-11-23 MED ORDER — ONDANSETRON 8 MG PO TBDP: 8.0000 mg | ORAL_TABLET | Freq: Three times a day (TID) | ORAL | Status: DC | PRN

## 2014-11-23 MED ORDER — BENZONATATE 100 MG PO CAPS: 200.0000 mg | ORAL_CAPSULE | Freq: Three times a day (TID) | ORAL | Status: DC | PRN

## 2014-11-23 MED ORDER — MOMETASONE FUROATE 50 MCG/ACT NA SUSP: 2.0000 | Freq: Every day | NASAL | Status: DC

## 2014-11-23 NOTE — Patient Instructions (Addendum)
-Take Levaquin as prescribed. -Take Tessalon Perles as prescribed for cough. -Take Zofran as prescribed for nausea.  -Take Allegra-D OTC- 1 tablet daily for allergies.  -While drinking fluids, pinch and hold nose close and swallow.  This will help open up your eustachian tubes to drain the fluid behind your ear drums.  -Nasonex- Take 2 sprays in each nostril at bedtime.  Make sure you spray towards the outside of each nostril towards the outer corner of your eye, hold nose close and tilt head back.  This will help the medication get into your sinuses.  If you do not like this medication, then use saline nasal sprays same directions as above for nasonex.  -It can take up to 2 weeks to feel better.  Sinusitis is mostly caused by viruses.  Please follow up in 1 month for regular follow up with labs    Sinusitis Sinusitis is redness, soreness, and inflammation of the paranasal sinuses. Paranasal sinuses are air pockets within the bones of your face (beneath the eyes, the middle of the forehead, or above the eyes). In healthy paranasal sinuses, mucus is able to drain out, and air is able to circulate through them by way of your nose. However, when your paranasal sinuses are inflamed, mucus and air can become trapped. This can allow bacteria and other germs to grow and cause infection. Sinusitis can develop quickly and last only a short time (acute) or continue over a long period (chronic). Sinusitis that lasts for more than 12 weeks is considered chronic.  CAUSES  Causes of sinusitis include:  Allergies.  Structural abnormalities, such as displacement of the cartilage that separates your nostrils (deviated septum), which can decrease the air flow through your nose and sinuses and affect sinus drainage.  Functional abnormalities, such as when the small hairs (cilia) that line your sinuses and help remove mucus do not work properly or are not present. SIGNS AND SYMPTOMS  Symptoms of acute and  chronic sinusitis are the same. The primary symptoms are pain and pressure around the affected sinuses. Other symptoms include:  Upper toothache.  Earache.  Headache.  Bad breath.  Decreased sense of smell and taste.  A cough, which worsens when you are lying flat.  Fatigue.  Fever.  Thick drainage from your nose, which often is green and may contain pus (purulent).  Swelling and warmth over the affected sinuses. DIAGNOSIS  Your health care provider will perform a physical exam. During the exam, your health care provider may:  Look in your nose for signs of abnormal growths in your nostrils (nasal polyps).  Tap over the affected sinus to check for signs of infection.  View the inside of your sinuses (endoscopy) using an imaging device that has a light attached (endoscope). If your health care provider suspects that you have chronic sinusitis, one or more of the following tests may be recommended:  Allergy tests.  Nasal culture. A sample of mucus is taken from your nose, sent to a lab, and screened for bacteria.  Nasal cytology. A sample of mucus is taken from your nose and examined by your health care provider to determine if your sinusitis is related to an allergy. TREATMENT  Most cases of acute sinusitis are related to a viral infection and will resolve on their own within 10 days. Sometimes medicines are prescribed to help relieve symptoms (pain medicine, decongestants, nasal steroid sprays, or saline sprays).  However, for sinusitis related to a bacterial infection, your health care provider will  prescribe antibiotic medicines. These are medicines that will help kill the bacteria causing the infection.  Rarely, sinusitis is caused by a fungal infection. In theses cases, your health care provider will prescribe antifungal medicine. For some cases of chronic sinusitis, surgery is needed. Generally, these are cases in which sinusitis recurs more than 3 times per year, despite  other treatments. HOME CARE INSTRUCTIONS   Drink plenty of water. Water helps thin the mucus so your sinuses can drain more easily.  Use a humidifier.  Inhale steam 3 to 4 times a day (for example, sit in the bathroom with the shower running).  Apply a warm, moist washcloth to your face 3 to 4 times a day, or as directed by your health care provider.  Use saline nasal sprays to help moisten and clean your sinuses.  Take medicines only as directed by your health care provider.  If you were prescribed either an antibiotic or antifungal medicine, finish it all even if you start to feel better. SEEK IMMEDIATE MEDICAL CARE IF:  You have increasing pain or severe headaches.  You have nausea, vomiting, or drowsiness.  You have swelling around your face.  You have vision problems.  You have a stiff neck.  You have difficulty breathing. MAKE SURE YOU:   Understand these instructions.  Will watch your condition.  Will get help right away if you are not doing well or get worse. Document Released: 11/13/2005 Document Revised: 03/30/2014 Document Reviewed: 11/28/2011 [facility] Patient Information 2015 [place], [place]. This information is not intended to replace advice given to you by your health care provider. Make sure you discuss any questions you have with your health care provider.

## 2014-11-23 NOTE — Progress Notes (Signed)
Subjective:    Patient ID: [name], female    DOB: 06-02-1951, 63 y.o.   MRN: [id number]  Cough This is a new problem. Episode onset: 4 days ago. The problem has been gradually worsening. The problem occurs constantly. Cough characteristics: yellow-green mucus. Associated symptoms include chills, headaches, postnasal drip, rhinorrhea and shortness of breath. Pertinent negatives include no ear pain, fever, rash, sore throat or wheezing. Treatments tried: Sudafed  The treatment provided no relief.  Never smoked.   Tonsillectomy GFR=  79 on 05/14/14 Review of Systems  Constitutional: Positive for chills, diaphoresis and fatigue. Negative for fever.  HENT: Positive for congestion, postnasal drip, rhinorrhea and sinus pressure. Negative for ear discharge, ear pain, sore throat and trouble swallowing.   Eyes: Negative.   Respiratory: Positive for cough and shortness of breath. Negative for chest tightness and wheezing.        Breathing heavier  Cardiovascular: Negative.   Gastrointestinal: Positive for nausea. Negative for vomiting, abdominal pain, diarrhea and constipation.       Lower left abdominal pain.  Constipation for 2 weeks and taking stool softners.  Constipation better now and not concerned about it.  Genitourinary: Negative.   Musculoskeletal: Negative.        Except muscle aches  Skin: Negative.  Negative for rash.  Neurological: Positive for headaches. Negative for dizziness and light-headedness.   Past Medical History  Diagnosis Date  . Hyperlipidemia   . Allergy   . Anxiety   . Plantar fasciitis   . Hypertension 2005  . Cancer 2011    [facility]   Current Outpatient Prescriptions on File Prior to Visit  Medication Sig Dispense Refill  . ALPRAZolam (XANAX) 0.25 MG tablet Take 1 tablet (0.25 mg total) by mouth at bedtime as needed for anxiety. 30 tablet 1  . triamterene-hydrochlorothiazide (MAXZIDE-25) 37.5-25 MG per tablet Take 1 tablet by mouth daily. 30 tablet 3   No  current facility-administered medications on file prior to visit.   No Known Allergies    BP 152/98 mmHg  Pulse 82  Temp(Src) 97.8 F (36.6 C) (Temporal)  Resp 18  Ht 5\' 7"  (1.702 m)  Wt 166 lb (75.297 kg)  BMI 25.99 kg/m2  SpO2 99% Wt Readings from Last 3 Encounters:  11/23/14 166 lb (75.297 kg)  05/14/14 165 lb (74.844 kg)  03/10/14 170 lb (77.111 kg)   Objective:   Physical Exam  Constitutional: She is oriented to person, place, and time. She appears well-developed and well-nourished. She has a sickly appearance. No distress.  HENT:  Head: Normocephalic.  Right Ear: External ear and ear canal normal. Tympanic membrane is bulging.  Left Ear: External ear and ear canal normal. Tympanic membrane is bulging.  Nose: Mucosal edema and rhinorrhea present. Right sinus exhibits maxillary sinus tenderness. Right sinus exhibits no frontal sinus tenderness. Left sinus exhibits maxillary sinus tenderness. Left sinus exhibits no frontal sinus tenderness.  Mouth/Throat: Uvula is midline and mucous membranes are normal. Mucous membranes are not pale and not dry. No trismus in the jaw. No uvula swelling. Posterior oropharyngeal erythema present. No oropharyngeal exudate or posterior oropharyngeal edema.  TMs bulging with clear fluid and normal light reflexes bilaterally.  TMs non-erythematous and non-edematous bilaterally. Turbinates erythematous bilaterally.  Eyes: Conjunctivae and lids are normal. Pupils are equal, round, and reactive to light. Right eye exhibits no discharge. Left eye exhibits no discharge. No scleral icterus.  Neck: Trachea normal, normal range of motion and phonation normal. Neck supple. No tracheal  tenderness present. No tracheal deviation present.  Cardiovascular: Normal rate, regular rhythm, S1 normal, S2 normal, normal heart sounds, intact distal pulses and normal pulses.  Exam reveals no gallop, no distant heart sounds and no friction rub.   No murmur  heard. Pulmonary/Chest: Effort normal and breath sounds normal. No stridor. No respiratory distress. She has no decreased breath sounds. She has no wheezes. She has no rhonchi. She has no rales. She exhibits no tenderness.  Abdominal: Soft. Bowel sounds are normal.  Lymphadenopathy:  No tenderness or LAD.  Neurological: She is alert and oriented to person, place, and time. Gait normal.  Skin: Skin is warm, dry and intact. No rash noted. She is not diaphoretic.  Psychiatric: She has a normal mood and affect. Her speech is normal and behavior is normal. Judgment and thought content normal. Cognition and memory are normal.  Vitals reviewed.  Assessment & Plan:  1. Acute maxillary sinusitis, recurrence not specified -Take Levaquin as prescribed- levofloxacin (LEVAQUIN) 500 MG tablet; Take 1 tablet (500 mg total) by mouth daily.  Dispense: 15 tablet; Refill: 0 -Gave Decadron shot in office-  dexamethasone (DECADRON) injection 10 mg; Inject 1 mL (10 mg total) into the muscle once. - Take Tessalon Perles as prescribed for cough- benzonatate (TESSALON PERLES) 100 MG capsule; Take 2 capsules (200 mg total) by mouth 3 (three) times daily as needed for cough (Max: 600mg  per day (6 capsules per day)).  Dispense: 120 capsule; Refill: 0 - Take Nasonex as prescribed for inflammation- mometasone (NASONEX) 50 MCG/ACT nasal spray; Place 2 sprays into the nose daily.  Dispense: 7.5 g; Refill: 1.  Gave pt two samples. -Take Allegra-D OTC- 1 tablet daily at bedtime. - POCT Influenza A/B- Negative  2. Essential hypertension -Continue Maxzide as prescribed.- triamterene-hydrochlorothiazide (MAXZIDE-25) 37.5-25 MG per tablet; Take 1 tablet by mouth daily.  Dispense: 30 tablet; Refill: 3  3. Nausea -Take Zofran as prescribed for nausea- ondansetron (ZOFRAN ODT) 8 MG disintegrating tablet; Take 1 tablet (8 mg total) by mouth every 8 (eight) hours as needed for nausea.  Dispense: 20 tablet; Refill: 0  Discussed  medication effects and SE's.  Pt agreed to treatment plan. If you are not feeling better in 10-14 days, then please call the office. Please follow up in 1 month for regular follow up with labs.  [name], Stephani Police, PA-C 9:55 AM [facility]

## 2014-11-24 ENCOUNTER — Other Ambulatory Visit: Payer: Self-pay | Admitting: Physician Assistant

## 2014-11-24 DIAGNOSIS — K649 Unspecified hemorrhoids: Secondary | ICD-10-CM

## 2014-11-24 MED ORDER — HYDROCORTISONE 2.5 % RE CREA: 1.0000 "application " | TOPICAL_CREAM | Freq: Four times a day (QID) | RECTAL | Status: DC | PRN

## 2014-11-24 NOTE — Progress Notes (Signed)
Patient aware.

## 2015-01-06 ENCOUNTER — Ambulatory Visit: Payer: Self-pay | Admitting: Physician Assistant

## 2015-03-09 ENCOUNTER — Ambulatory Visit: Payer: Self-pay | Admitting: Internal Medicine

## 2015-05-18 ENCOUNTER — Encounter: Payer: Self-pay | Admitting: Emergency Medicine

## 2015-07-20 ENCOUNTER — Other Ambulatory Visit: Payer: Self-pay | Admitting: Physician Assistant

## 2015-08-13 ENCOUNTER — Ambulatory Visit: Payer: Self-pay | Admitting: Internal Medicine

## 2015-08-16 ENCOUNTER — Encounter: Payer: Self-pay | Admitting: Internal Medicine

## 2015-08-16 ENCOUNTER — Ambulatory Visit (INDEPENDENT_AMBULATORY_CARE_PROVIDER_SITE_OTHER): Payer: BC Managed Care – PPO | Admitting: Internal Medicine

## 2015-08-16 VITALS — BP 142/90 | HR 110 | Temp 98.0°F | Resp 18 | Ht 67.0 in | Wt 167.0 lb

## 2015-08-16 DIAGNOSIS — J0100 Acute maxillary sinusitis, unspecified: Secondary | ICD-10-CM

## 2015-08-16 MED ORDER — DEXAMETHASONE SODIUM PHOSPHATE 100 MG/10ML IJ SOLN
10.0000 mg | Freq: Once | INTRAMUSCULAR | Status: AC
  Administered 2015-08-16: 10 mg via INTRAMUSCULAR

## 2015-08-16 MED ORDER — AZITHROMYCIN 250 MG PO TABS: ORAL_TABLET | ORAL | Status: DC

## 2015-08-16 MED ORDER — PHENYLEPH-PROMETHAZINE-COD 5-6.25-10 MG/5ML PO SYRP: 5.0000 mL | ORAL_SOLUTION | Freq: Every evening | ORAL | Status: DC | PRN

## 2015-08-16 MED ORDER — TRIAMTERENE-HCTZ 37.5-25 MG PO TABS: 1.0000 | ORAL_TABLET | Freq: Every day | ORAL | Status: DC

## 2015-08-16 MED ORDER — MOMETASONE FUROATE 50 MCG/ACT NA SUSP: 2.0000 | Freq: Every day | NASAL | Status: DC

## 2015-08-16 NOTE — Patient Instructions (Signed)

## 2015-08-16 NOTE — Progress Notes (Signed)
Patient ID: [name], female   DOB: 1951/05/23, 64 y.o.   MRN: [id number]  HPI  Patient presents to the office for evaluation of cough.  It has been going on for 4 days.  Patient reports night > day, wet, worse with lying down, yellow green sputum production similar to when she blows her nose.  They also endorse change in voice, chills, fever, postnasal drip, shortness of breath, wheezing and sinus pressure, sinus congestion, sore throat, mild ear pain.  .  They have tried antihistamines or sudafed.  They report that nothing has worked.  They denies other sick contacts.  She does report that she has this once per year around when the seasons change.  She feels like this is similar to when she had a sinus infection last year.    ROS  PE:  Filed Vitals:   08/16/15 1058  BP: 142/90  Pulse: 110  Temp: 98 F (36.7 C)  Resp: 18    General:  Alert and non-toxic, WDWN, NAD HEENT: NCAT, PERLA, EOM normal, no occular discharge or erythema.  Nasal mucosal edema with sinus tenderness to palpation.  Oropharynx clear with minimal oropharyngeal edema and erythema.  Mucous membranes moist and pink. Neck:  Cervical adenopathy Chest:  RRR no MRGs.  Lungs clear to auscultation A&P with no wheezes rhonchi or rales.   Abdomen: +BS x 4 quadrants, soft, non-tender, no guarding, rigidity, or rebound. Skin: warm and dry no rash Neuro: A&Ox4, CN II-XII grossly intact  Assessment and Plan:   1. Acute maxillary sinusitis, recurrence not specified -zpack -allegra d -mucinex -codeine cough syrup - mometasone (NASONEX) 50 MCG/ACT nasal spray; Place 2 sprays into the nose daily.  Dispense: 17 g; Refill: 1 - dexamethasone (DECADRON) injection 10 mg; Inject 1 mL (10 mg total) into the muscle once.

## 2015-08-19 ENCOUNTER — Ambulatory Visit: Payer: Self-pay | Admitting: Internal Medicine

## 2015-10-26 DIAGNOSIS — G5601 Carpal tunnel syndrome, right upper limb: Secondary | ICD-10-CM | POA: Insufficient documentation

## 2015-10-26 DIAGNOSIS — M7712 Lateral epicondylitis, left elbow: Secondary | ICD-10-CM | POA: Insufficient documentation

## 2015-10-26 HISTORY — DX: Carpal tunnel syndrome, right upper limb: G56.01

## 2015-11-21 ENCOUNTER — Other Ambulatory Visit: Payer: Self-pay | Admitting: Internal Medicine

## 2015-11-21 DIAGNOSIS — I10 Essential (primary) hypertension: Secondary | ICD-10-CM

## 2015-11-30 ENCOUNTER — Other Ambulatory Visit: Payer: Self-pay | Admitting: Internal Medicine

## 2016-01-24 ENCOUNTER — Emergency Department (HOSPITAL_COMMUNITY)
Admission: EM | Admit: 2016-01-24 | Discharge: 2016-01-24 | Disposition: A | Discharge status: Home / Self Care | Payer: BC Managed Care – PPO | Attending: Emergency Medicine | Admitting: Emergency Medicine

## 2016-01-24 ENCOUNTER — Emergency Department (HOSPITAL_COMMUNITY): Payer: BC Managed Care – PPO

## 2016-01-24 ENCOUNTER — Other Ambulatory Visit: Payer: Self-pay | Admitting: Internal Medicine

## 2016-01-24 DIAGNOSIS — Z7951 Long term (current) use of inhaled steroids: Secondary | ICD-10-CM | POA: Diagnosis not present

## 2016-01-24 DIAGNOSIS — Z8639 Personal history of other endocrine, nutritional and metabolic disease: Secondary | ICD-10-CM | POA: Diagnosis not present

## 2016-01-24 DIAGNOSIS — Z8659 Personal history of other mental and behavioral disorders: Secondary | ICD-10-CM | POA: Insufficient documentation

## 2016-01-24 DIAGNOSIS — Z8739 Personal history of other diseases of the musculoskeletal system and connective tissue: Secondary | ICD-10-CM | POA: Insufficient documentation

## 2016-01-24 DIAGNOSIS — Z85828 Personal history of other malignant neoplasm of skin: Secondary | ICD-10-CM | POA: Diagnosis not present

## 2016-01-24 DIAGNOSIS — I10 Essential (primary) hypertension: Secondary | ICD-10-CM | POA: Diagnosis not present

## 2016-01-24 DIAGNOSIS — G510 Bell's palsy: Secondary | ICD-10-CM | POA: Insufficient documentation

## 2016-01-24 DIAGNOSIS — R29810 Facial weakness: Secondary | ICD-10-CM | POA: Diagnosis present

## 2016-01-24 LAB — DIFFERENTIAL
BASOS ABS: 0.1 10*3/uL (ref 0.0–0.1)
Basophils Relative: 1 %
EOS ABS: 0.3 10*3/uL (ref 0.0–0.7)
Eosinophils Relative: 4 %
LYMPHS ABS: 2.8 10*3/uL (ref 0.7–4.0)
LYMPHS PCT: 40 %
Monocytes Absolute: 0.5 10*3/uL (ref 0.1–1.0)
Monocytes Relative: 7 %
NEUTROS PCT: 48 %
Neutro Abs: 3.5 10*3/uL (ref 1.7–7.7)

## 2016-01-24 LAB — URINALYSIS, ROUTINE W REFLEX MICROSCOPIC
Bilirubin Urine: NEGATIVE
Glucose, UA: NEGATIVE mg/dL
Hgb urine dipstick: NEGATIVE
Ketones, ur: NEGATIVE mg/dL
NITRITE: NEGATIVE
PH: 5.5 (ref 5.0–8.0)
Protein, ur: NEGATIVE mg/dL
SPECIFIC GRAVITY, URINE: 1.011 (ref 1.005–1.030)

## 2016-01-24 LAB — COMPREHENSIVE METABOLIC PANEL
ALBUMIN: 4.4 g/dL (ref 3.5–5.0)
ALT: 22 U/L (ref 14–54)
ANION GAP: 13 (ref 5–15)
AST: 27 U/L (ref 15–41)
Alkaline Phosphatase: 93 U/L (ref 38–126)
BILIRUBIN TOTAL: 0.4 mg/dL (ref 0.3–1.2)
BUN: 10 mg/dL (ref 6–20)
CALCIUM: 10 mg/dL (ref 8.9–10.3)
CHLORIDE: 104 mmol/L (ref 101–111)
CO2: 26 mmol/L (ref 22–32)
CREATININE: 0.84 mg/dL (ref 0.44–1.00)
GFR calc Af Amer: >60 mL/min (ref >60)
GFR calc non Af Amer: >60 mL/min (ref >60)
Glucose, Bld: 102 mg/dL — ABNORMAL HIGH (ref 65–99)
Potassium: 3.7 mmol/L (ref 3.5–5.1)
Sodium: 143 mmol/L (ref 135–145)
Total Protein: 7.5 g/dL (ref 6.5–8.1)

## 2016-01-24 LAB — ETHANOL: Alcohol, Ethyl (B): <5 mg/dL (ref <5)

## 2016-01-24 LAB — I-STAT CHEM 8, ED
BUN: 12 mg/dL (ref 6–20)
CHLORIDE: 103 mmol/L (ref 101–111)
CREATININE: 0.8 mg/dL (ref 0.44–1.00)
Calcium, Ion: 1.23 mmol/L (ref 1.13–1.30)
Glucose, Bld: 95 mg/dL (ref 65–99)
HEMATOCRIT: 47 % — AB (ref 36.0–46.0)
Hemoglobin: 16 g/dL — ABNORMAL HIGH (ref 12.0–15.0)
POTASSIUM: 3.5 mmol/L (ref 3.5–5.1)
Sodium: 144 mmol/L (ref 135–145)
TCO2: 27 mmol/L (ref 0–100)

## 2016-01-24 LAB — URINE MICROSCOPIC-ADD ON

## 2016-01-24 LAB — CBC
HCT: 42.2 % (ref 36.0–46.0)
HEMOGLOBIN: 14.6 g/dL (ref 12.0–15.0)
MCH: 29 pg (ref 26.0–34.0)
MCHC: 34.6 g/dL (ref 30.0–36.0)
MCV: 83.7 fL (ref 78.0–100.0)
Platelets: 180 10*3/uL (ref 150–400)
RBC: 5.04 MIL/uL (ref 3.87–5.11)
RDW: 12.9 % (ref 11.5–15.5)
WBC: 7.1 10*3/uL (ref 4.0–10.5)

## 2016-01-24 LAB — APTT: APTT: 26 s (ref 24–37)

## 2016-01-24 LAB — I-STAT TROPONIN, ED: TROPONIN I, POC: 0 ng/mL (ref 0.00–0.08)

## 2016-01-24 LAB — RAPID URINE DRUG SCREEN, HOSP PERFORMED
Amphetamines: NOT DETECTED
Barbiturates: NOT DETECTED
Benzodiazepines: NOT DETECTED
Cocaine: NOT DETECTED
OPIATES: NOT DETECTED
Tetrahydrocannabinol: NOT DETECTED

## 2016-01-24 LAB — CBG MONITORING, ED
GLUCOSE-CAPILLARY: 101 mg/dL — AB (ref 65–99)
GLUCOSE-CAPILLARY: 92 mg/dL (ref 65–99)

## 2016-01-24 LAB — PROTIME-INR
INR: 0.92 (ref 0.00–1.49)
Prothrombin Time: 12.6 seconds (ref 11.6–15.2)

## 2016-01-24 MED ORDER — PREDNISONE 20 MG PO TABS
60.0000 mg | ORAL_TABLET | Freq: Once | ORAL | Status: AC
  Administered 2016-01-24: 60 mg via ORAL
  Filled 2016-01-24: qty 3

## 2016-01-24 MED ORDER — HYDROCODONE-ACETAMINOPHEN 5-325 MG PO TABS
2.0000 | ORAL_TABLET | Freq: Once | ORAL | Status: AC
  Administered 2016-01-24: 2 via ORAL
  Filled 2016-01-24: qty 2

## 2016-01-24 MED ORDER — PREDNISONE 20 MG PO TABS: 60.0000 mg | ORAL_TABLET | Freq: Every day | ORAL | Status: DC

## 2016-01-24 MED ORDER — CEPHALEXIN 500 MG PO CAPS: 500.0000 mg | ORAL_CAPSULE | Freq: Four times a day (QID) | ORAL | Status: DC

## 2016-01-24 MED ORDER — VALACYCLOVIR HCL 1 G PO TABS: 1000.0000 mg | ORAL_TABLET | Freq: Three times a day (TID) | ORAL | Status: AC

## 2016-01-24 MED ORDER — ARTIFICIAL TEARS OP OINT: TOPICAL_OINTMENT | Freq: Every evening | OPHTHALMIC | Status: DC | PRN

## 2016-01-24 NOTE — ED Notes (Signed)
MD Sabra Heck cleared airway prior to CT

## 2016-01-24 NOTE — ED Provider Notes (Addendum)
CSN: [id number]     Arrival date & time 01/24/16  1941 History   First MD Initiated Contact with Patient 01/24/16 1958     Chief Complaint  Patient presents with  . Facial Droop    An emergency department physician performed an initial assessment on this suspected stroke patient at 5. (Consider location/radiation/quality/duration/timing/severity/associated sxs/prior Treatment) The history is provided by the patient.  Patient c/o left facial droop onset today.  States left eye had felt funny in the past day, tearing, states unsure of eyelid closing normally.  Pt symptoms persistent, constant since onset. No specific exacerbating or alleviating factors. No hx same. States had mild headache earlier and that area around left ear felt hot.  No skin changes/rash/lesions. No hearing loss or tinnitus. No fever or chills. Denies change in vision. No extremity numbness/weakness or loss of normal functional ability or coordination.       Past Medical History  Diagnosis Date  . Hyperlipidemia   . Allergy   . Anxiety   . Plantar fasciitis   . Hypertension 2005  . Cancer 2011    [facility]   Past Surgical History  Procedure Laterality Date  . Abdominal hysterectomy  1990  . Tonsillectomy    . Tendon repair      plantar fasciitis  . Basal cell carcinoma  2011    nose   Family History  Problem Relation Age of Onset  . Hypertension Mother   . Heart attack Mother   . Hyperlipidemia Mother   . Heart disease Mother 68    fatal MI  . Stroke Mother   . Arthritis Mother     rheumatoid  . Cancer Father     lung  . Hypertension Brother   . Cancer Brother     throat, leukemia  . Parkinson's disease Brother    Social History  Substance Use Topics  . Smoking status: Never Smoker   . Smokeless tobacco: Not on file  . Alcohol Use: Not on file   OB History    No data available     Review of Systems  Constitutional: Negative for fever and chills.  HENT: Negative for sore throat.   Eyes:  Negative for visual disturbance.  Respiratory: Negative for shortness of breath.   Cardiovascular: Negative for chest pain.  Gastrointestinal: Negative for vomiting and abdominal pain.  Genitourinary: Negative for flank pain.  Musculoskeletal: Negative for back pain and neck pain.  Skin: Negative for rash.  Neurological: Negative for dizziness, speech difficulty, numbness and headaches.  Hematological: Does not bruise/bleed easily.  Psychiatric/Behavioral: Negative for confusion.      Allergies  Review of patient's allergies indicates no known allergies.  Home Medications   Prior to Admission medications   Medication Sig Start Date End Date Taking? Authorizing Provider  azithromycin (ZITHROMAX Z-PAK) 250 MG tablet 2 po day one, then 1 daily x 4 days 08/16/15   Loma Sousa Forcucci, PA-C  Loratadine-Pseudoephedrine (CLARITIN-D 24 HOUR PO) Take by mouth daily as needed.    Historical Provider, MD  mometasone (NASONEX) 50 MCG/ACT nasal spray Place 2 sprays into the nose daily. 08/16/15 08/15/16  Courtney Forcucci, PA-C  Phenyleph-Promethazine-Cod 5-6.25-10 MG/5ML SYRP Take 5 mLs by mouth at bedtime as needed. 08/16/15   Courtney Forcucci, PA-C  triamterene-hydrochlorothiazide (MAXZIDE-25) 37.5-25 MG tablet TAKE 1 TABLET BY MOUTH DAILY. 11/21/15   Unk Pinto, MD  triamterene-hydrochlorothiazide (MAXZIDE-25) 37.5-25 MG tablet TAKE 1 TABLET BY MOUTH DAILY. 11/30/15   Unk Pinto, MD   BP 182/104  mmHg  Pulse 103  Temp(Src) 97.8 F (36.6 C) (Oral)  Resp 21  Wt 81.4 kg  SpO2 96% Physical Exam  Constitutional: She is oriented to person, place, and time. She appears well-developed and well-nourished. No distress.  HENT:  Head: Atraumatic.  Nose: Nose normal.  Mouth/Throat: Oropharynx is clear and moist.  No sinus or temporal tenderness. Left eac clear, tm neg. No mastoid tenderness.   Eyes: Conjunctivae and EOM are normal. Pupils are equal, round, and reactive to light. No scleral  icterus.  Neck: Neck supple. No tracheal deviation present. No thyromegaly present.  No stiffness or rigidity.   Cardiovascular: Normal rate, regular rhythm, normal heart sounds and intact distal pulses.  Exam reveals no gallop and no friction rub.   No murmur heard. Pulmonary/Chest: Effort normal and breath sounds normal. No respiratory distress.  Abdominal: Soft. Normal appearance and bowel sounds are normal. She exhibits no distension. There is no tenderness.  Genitourinary:  No cva tenderness.  Musculoskeletal: Normal range of motion. She exhibits no edema or tenderness.  Neurological: She is alert and oriented to person, place, and time.  Left facial weakness including forehead. sens grossly intact. Motor intact bilateral ext, stre 5/5. No pronator drift.   Skin: Skin is warm and dry. No rash noted. She is not diaphoretic.  Psychiatric: She has a normal mood and affect.  Nursing note and vitals reviewed.   ED Course  Procedures (including critical care time) Labs Review   Results for orders placed or performed during the hospital encounter of 01/24/16  Ethanol  Result Value Ref Range   Alcohol, Ethyl (B) <5 <5 mg/dL  Protime-INR  Result Value Ref Range   Prothrombin Time 12.6 11.6 - 15.2 seconds   INR 0.92 0.00 - 1.49  APTT  Result Value Ref Range   aPTT 26 24 - 37 seconds  CBC  Result Value Ref Range   WBC 7.1 4.0 - 10.5 K/uL   RBC 5.04 3.87 - 5.11 MIL/uL   Hemoglobin 14.6 12.0 - 15.0 g/dL   HCT 42.2 36.0 - 46.0 %   MCV 83.7 78.0 - 100.0 fL   MCH 29.0 26.0 - 34.0 pg   MCHC 34.6 30.0 - 36.0 g/dL   RDW 12.9 11.5 - 15.5 %   Platelets 180 150 - 400 K/uL  Differential  Result Value Ref Range   Neutrophils Relative % 48 %   Neutro Abs 3.5 1.7 - 7.7 K/uL   Lymphocytes Relative 40 %   Lymphs Abs 2.8 0.7 - 4.0 K/uL   Monocytes Relative 7 %   Monocytes Absolute 0.5 0.1 - 1.0 K/uL   Eosinophils Relative 4 %   Eosinophils Absolute 0.3 0.0 - 0.7 K/uL   Basophils  Relative 1 %   Basophils Absolute 0.1 0.0 - 0.1 K/uL  Comprehensive metabolic panel  Result Value Ref Range   Sodium 143 135 - 145 mmol/L   Potassium 3.7 3.5 - 5.1 mmol/L   Chloride 104 101 - 111 mmol/L   CO2 26 22 - 32 mmol/L   Glucose, Bld 102 (H) 65 - 99 mg/dL   BUN 10 6 - 20 mg/dL   Creatinine, Ser 0.84 0.44 - 1.00 mg/dL   Calcium 10.0 8.9 - 10.3 mg/dL   Total Protein 7.5 6.5 - 8.1 g/dL   Albumin 4.4 3.5 - 5.0 g/dL   AST 27 15 - 41 U/L   ALT 22 14 - 54 U/L   Alkaline Phosphatase 93 38 - 126 U/L  Total Bilirubin 0.4 0.3 - 1.2 mg/dL   GFR calc non Af Amer >60 >60 mL/min   GFR calc Af Amer >60 >60 mL/min   Anion gap 13 5 - 15  Urine rapid drug screen (hosp performed)not at [facility]  Result Value Ref Range   Opiates NONE DETECTED NONE DETECTED   Cocaine NONE DETECTED NONE DETECTED   Benzodiazepines NONE DETECTED NONE DETECTED   Amphetamines NONE DETECTED NONE DETECTED   Tetrahydrocannabinol NONE DETECTED NONE DETECTED   Barbiturates NONE DETECTED NONE DETECTED  Urinalysis, Routine w reflex microscopic (not at [facility])  Result Value Ref Range   Color, Urine STRAW (A) YELLOW   APPearance CLOUDY (A) CLEAR   Specific Gravity, Urine 1.011 1.005 - 1.030   pH 5.5 5.0 - 8.0   Glucose, UA NEGATIVE NEGATIVE mg/dL   Hgb urine dipstick NEGATIVE NEGATIVE   Bilirubin Urine NEGATIVE NEGATIVE   Ketones, ur NEGATIVE NEGATIVE mg/dL   Protein, ur NEGATIVE NEGATIVE mg/dL   Nitrite NEGATIVE NEGATIVE   Leukocytes, UA MODERATE (A) NEGATIVE  Urine microscopic-add on  Result Value Ref Range   Squamous Epithelial / LPF 0-5 (A) NONE SEEN   WBC, UA 6-30 0 - 5 WBC/hpf   RBC / HPF 0-5 0 - 5 RBC/hpf   Bacteria, UA MANY (A) NONE SEEN  I-Stat Chem 8, ED  (not at [facility], [facility])  Result Value Ref Range   Sodium 144 135 - 145 mmol/L   Potassium 3.5 3.5 - 5.1 mmol/L   Chloride 103 101 - 111 mmol/L   BUN 12 6 - 20 mg/dL   Creatinine, Ser 0.80 0.44 - 1.00 mg/dL   Glucose, Bld 95 65 - 99 mg/dL   Calcium, Ion  1.23 1.13 - 1.30 mmol/L   TCO2 27 0 - 100 mmol/L   Hemoglobin 16.0 (H) 12.0 - 15.0 g/dL   HCT 47.0 (H) 36.0 - 46.0 %  I-stat troponin, ED (not at [facility], [facility])  Result Value Ref Range   Troponin i, poc 0.00 0.00 - 0.08 ng/mL   Comment 3          CBG monitoring, ED  Result Value Ref Range   Glucose-Capillary 101 (H) 65 - 99 mg/dL  CBG monitoring, ED  Result Value Ref Range   Glucose-Capillary 92 65 - 99 mg/dL   Ct Head Wo Contrast  01/24/2016  CLINICAL DATA:  65 year old female with left-sided facial droop and trouble swallowing. Code stroke. EXAM: CT HEAD WITHOUT CONTRAST TECHNIQUE: Contiguous axial images were obtained from the base of the skull through the vertex without intravenous contrast. COMPARISON:  None. FINDINGS: The ventricles and sulci are appropriate in size for patient's age. Minimal periventricular and deep white matter chronic microvascular ischemic changes noted. There is no acute intracranial hemorrhage. No mass effect or midline shift. The visualized paranasal sinuses and mastoid air cells are clear. The calvarium is intact. IMPRESSION: No acute intracranial pathology. These results were called by telephone at the time of interpretation on 01/24/2016 at 8:03 pm to Dr. Lajean Saver , who verbally acknowledged these results. Electronically Signed   By: Anner Crete M.D.   On: 01/24/2016 20:04      I have personally reviewed and evaluated these images and lab results as part of my medical decision-making.   EKG Interpretation   Date/Time:  Monday January 24 2016 19:46:33 EST Ventricular Rate:  110 PR Interval:  168 QRS Duration: 88 QT Interval:  346 QTC Calculation: 468 R Axis:   -33 Text Interpretation:  Sinus tachycardia Possible Left atrial enlargement  Left axis deviation No significant change since last tracing `x rate  faster Confirmed by Ashok Cordia  MD, Lennette Bihari ([id number]) on 01/24/2016 7:59:11 PM      MDM   Labs. Ct. Monitoring.  Reviewed nursing notes and  prior charts for additional history.   Neurology evaluated patient in ED, and indicates pt has [place], and requests d/c to home w rx pred and valtrex.  pred po.  Possible uti on labs, many bact, 6-30 wbc, will rx.  Recheck pt requests pain med, no change in exam. bp improved. Hydrocodone po.       Lajean Saver, MD 01/24/16 2214

## 2016-01-24 NOTE — Progress Notes (Signed)
Code Stroke called on 65 y.o female. Brought in via triage with new onset left facial tingling, left sift head ache, left facial droop. Pertinent history includes HTN, Hyperlipidemia. Initial LSN 530 pm this afternoon, after more discusion Pt has had left side headache with left eyes excessive tears for two days. Labs drawn STAT in triage.  Pt taken to CT scan, negative per radiology report. NIHSS completed yielding 1 for facial droop. Code stroke canceled at 2019 per Dr. Lavone Neri with new diagnosis being left Bell's Palsy.

## 2016-01-24 NOTE — Discharge Instructions (Signed)
It was our pleasure to provide your ER care today - we hope that you feel better.  Take prednisone and valtrex as prescribed.  Follow up with neurologist in the next 1-2 weeks - call office to arrange appointment.  To avoid eye pain/irritation, use moistening/lubricating eye drops several times per day.  At bedtime, apply eye ointment.  If eye fails to close completely, tape gently closed when going to sleep at night.   The lab tests also show a possible urine infection - take antibiotic as prescribed.   Your blood pressure was high tonight - follow up with primary care doctor in coming week for recheck.  Return to ER if worse, new symptoms, change in speech or vision, any new arm or leg numbness or weakness, severe headache, other concern.   You were given pain medication in the ER - no driving for the next 4 hours.      Bell Palsy Bell palsy is a condition in which the muscles on one side of the face become paralyzed. This often causes one side of the face to droop. It is a common condition and most people recover completely. RISK FACTORS Risk factors for Bell palsy include:  Pregnancy.  Diabetes.  An infection by a virus, such as infections that cause cold sores. CAUSES  Bell palsy is caused by damage to or inflammation of a nerve in your face. It is unclear why this happens, but an infection by a virus may lead to it. Most of the time the reason it happens is unknown. SIGNS AND SYMPTOMS  Symptoms can range from mild to severe and can take place over a number of hours. Symptoms may include:  Being unable to:  Raise one or both eyebrows.  Close one or both eyes.  Feel parts of your face (facial numbness).  Drooping of the eyelid and corner of the mouth.  Weakness in the face.  Paralysis of half your face.  Loss of taste.  Sensitivity to loud noises.  Difficulty chewing.  Tearing up of the affected eye.  Dryness in the affected eye.  Drooling.  Pain behind  one ear. DIAGNOSIS  Diagnosis of Bell palsy may include:  A medical history and physical exam.  An MRI.  A CT scan.  Electromyography (EMG). This is a test that checks how your nerves are working. TREATMENT  Treatment may include antiviral medicine to help shorten the length of the condition. Sometimes treatment is not needed and the symptoms go away on their own. HOME CARE INSTRUCTIONS   Take medicines only as directed by your health care provider.  Do facial massages and exercises as directed by your health care provider.  If your eye is affected:  Use moisturizing eye drops to prevent drying of your eye as directed by your health care provider.  Protect your eye as directed by your health care provider. SEEK MEDICAL CARE IF:  Your symptoms do not get better or get worse.  You are drooling.  Your eye is red, irritated, or hurts. SEEK IMMEDIATE MEDICAL CARE IF:   Another part of your body feels weak or numb.  You have difficulty swallowing.  You have a fever along with symptoms of Bell palsy.  You develop neck pain. MAKE SURE YOU:   Understand these instructions.  Will watch your condition.  Will get help right away if you are not doing well or get worse.   This information is not intended to replace advice given to you by your  health care provider. Make sure you discuss any questions you have with your health care provider.   Document Released: 11/13/2005 Document Revised: 08/04/2015 Document Reviewed: 02/20/2014 Elsevier Interactive Patient Education [employer].

## 2016-01-24 NOTE — ED Notes (Signed)
Pt presents with left sided facial droop, difficulty swallowing, and headache since 5pm today, facial droop noted, pt reports facial numbness, denies other symptoms, alert and oriented, c/o headache, code stroke activated at nurse first

## 2016-01-24 NOTE — Consult Note (Signed)
Requesting physican: K. Ashok Cordia, M.D.  Reason for consult: Acute facial weakness   HPI: [name] is a 65 y.o. right handed  female on whom I have been asked to consult for evaluation and treatment of a possible stroke. Onset of symptoms was 5:30pm today. However, in retrospect she has noted tearing of the left eye for two days.  According to the patient, she has suffered a left sided headache for the past two days. She has a history of headaches and this headache is typical for her. Today she had an argument with someone and shortly afterwards noted left facial tingling. She attempted to eat something and she felt she had difficulty swallowing so she came to the E.D.    Upon questioning, she has noted a metallic taste in her mouth. No weakness or numbness of arms or legs. Her left ear feels hot.    Outside reports reviewed:   Patient Active Problem List   Diagnosis Date Noted  . Hypertension   . Hyperlipidemia   . Allergy   . Anxiety   . Plantar fasciitis   . Unspecified essential hypertension 11/10/2013   Past Medical History  Diagnosis Date  . Hyperlipidemia   . Allergy   . Anxiety   . Plantar fasciitis   . Hypertension 2005  . Cancer 2011    [facility]   Past Surgical History  Procedure Laterality Date  . Abdominal hysterectomy  1990  . Tonsillectomy    . Tendon repair      plantar fasciitis  . Basal cell carcinoma  2011    nose   Family History  Problem Relation Age of Onset  . Hypertension Mother   . Heart attack Mother   . Hyperlipidemia Mother   . Heart disease Mother 47    fatal MI  . Stroke Mother   . Arthritis Mother     rheumatoid  . Cancer Father     lung  . Hypertension Brother   . Cancer Brother     throat, leukemia  . Parkinson's disease Brother    Scheduled Meds: Continuous Infusions: PRN Meds:  No Known Allergies Social History   Social History  . Marital Status: Married    Spouse Name: N/A  . Number of Children: N/A  . Years of  Education: N/A   Occupational History  . Not on file.   Social History Main Topics  . Smoking status: Never Smoker   . Smokeless tobacco: Not on file  . Alcohol Use: Not on file  . Drug Use: Not on file  . Sexual Activity: Not on file   Other Topics Concern  . Not on file   Social History Narrative    Review of Systems No recent illness. No vision changes. No difficulty speaking. No chest pain, SOB, ab pain, nausea, vomiting, diarrhea, no joint pains, swelling, rashes. All systems reviewed were negative.   Objective: Vital signs in last 24 hours: Temp:  [97.8 F (36.6 C)] 97.8 F (36.6 C) (02/27 2013) Pulse Rate:  [103] 103 (02/27 2000) Resp:  [21] 21 (02/27 2000) BP: (182)/(104) 182/104 mmHg (02/27 2000) SpO2:  [96 %] 96 % (02/27 2000) Weight:  [81.4 kg (179 lb 7.3 oz)] 81.4 kg (179 lb 7.3 oz) (02/27 2003)  BP 182/104 mmHg  Pulse 103  Temp(Src) 97.8 F (36.6 C) (Oral)  Resp 21  Wt 81.4 kg (179 lb 7.3 oz)  SpO2 96%  General Appearance:    Alert, cooperative, no distress, appears stated  age  Head:    Normocephalic, without obvious abnormality, atraumatic  Eyes:    PERRL, conjunctiva/corneas clear, EOM's intact, fundi    benign, both eyes       Ears:    Normal TM's and external ear canals, both ears     Throat:   Lips, mucosa, and tongue normal; teeth and gums normal  Neck:   Supple, symmetrical, trachea midline, no adenopathy;       thyroid:  No enlargement/tenderness/nodules; no carotid   bruit or JVD     Lungs:     Clear to auscultation bilaterally, respirations unlabored     Heart:    Regular rate and rhythm, S1 and S2 normal, no murmur, rub   or gallop  Abdomen:     Soft, non-tender, bowel sounds active all four quadrants,    no masses, no organomegaly        Extremities:   Extremities normal, atraumatic, no cyanosis or edema  Pulses:   2+ and symmetric all extremities  Skin:   Skin color, texture, turgor normal, no rashes or lesions     Neurologic:    Mental: Alert. Fluent. Names. Repeats. Follows commands. Describes pictures  CNs: Weakness of left eyelid closure and mild left lower facial droop. Normal facial sensation. Otherwise CNs II through XII intact.  Motor: Full strength throughout. No drift of extemities  Sensory: Equal to temperature throughout  Coord: FTN and HTS intact  DTRs 2+ and toes downgoing.     Imaging CT Head: obtained and reviewed  Lab Review Lab Results  Component Value Date   WBC 7.1 01/24/2016   RBC 5.04 01/24/2016   HGB 16.0* 01/24/2016   HCT 47.0* 01/24/2016   PLT 180 01/24/2016   Lab Results  Component Value Date   NA 144 01/24/2016   K 3.5 01/24/2016   CREATININE 0.80 01/24/2016   CREATININE 0.80 05/14/2014   BUN 12 01/24/2016   Lab Results  Component Value Date   APTT 26 01/24/2016   Lab Results  Component Value Date   INR 0.92 01/24/2016    Assessment/Plan: Left Bell's Palsy  1. Prednisone 60mg  daily x 7 days. May give first dose now. 2. Valtrex 1000mg  tid x 5 days 3. Eyepatch at night to protect eye 4. Eyeglasses or sunglasses during daytime to protect eye 5. Outpatient follow up with neurology.  6. Prognosis for recovery is good in this patient but may take several months  Thank you for allowing [place] to participate in the care of this patient.

## 2016-01-26 DIAGNOSIS — H4902 Third [oculomotor] nerve palsy, left eye: Secondary | ICD-10-CM | POA: Diagnosis not present

## 2016-02-04 ENCOUNTER — Ambulatory Visit (INDEPENDENT_AMBULATORY_CARE_PROVIDER_SITE_OTHER): Payer: Medicare Other | Admitting: Neurology

## 2016-02-04 ENCOUNTER — Encounter: Payer: Self-pay | Admitting: Neurology

## 2016-02-04 VITALS — BP 153/82 | HR 84 | Resp 16 | Ht 67.0 in | Wt 181.0 lb

## 2016-02-04 DIAGNOSIS — G510 Bell's palsy: Secondary | ICD-10-CM | POA: Diagnosis not present

## 2016-02-04 DIAGNOSIS — Z8669 Personal history of other diseases of the nervous system and sense organs: Secondary | ICD-10-CM | POA: Insufficient documentation

## 2016-02-04 HISTORY — DX: Bell's palsy: G51.0

## 2016-02-04 NOTE — Progress Notes (Signed)
Reason for visit: Bell's palsy  Referring physician: Kennedy  [name] is a 65 y.o. female  History of present illness:  [name] is a 65 year old right-handed white female with a history of onset of left facial weakness that occurred around 01/24/2016. The patient had a headache on the left side of the head around the ear for 2 days prior, and then developed onset of left facial weakness associated with inability to close the left eye, and alteration in hearing in the left ear and taste alteration. The patient was placed on prednisone and Valtrex, and she has made a dramatic improvement over the last week or so. The patient has felt that the left eye is somewhat dry, she uses eyedrops, and she has some blurring of vision in the left eye. She denies any weakness or numbness of the arms or legs. She denies any difficulty with speech or swallowing at this time. The patient still has some mild pain on the left ear at times. She denies any balance issues or difficulty controlling the bowels or the bladder. She comes to this office for an evaluation.  Past Medical History  Diagnosis Date  . Hyperlipidemia   . Allergy   . Anxiety   . Plantar fasciitis   . Hypertension 2005  . Cancer ([place]) 2011    [facility]    Past Surgical History  Procedure Laterality Date  . Abdominal hysterectomy  1990  . Tonsillectomy    . Tendon repair      plantar fasciitis  . Basal cell carcinoma  2011    nose    Family History  Problem Relation Age of Onset  . Hypertension Mother   . Heart attack Mother   . Hyperlipidemia Mother   . Heart disease Mother 67    fatal MI  . Stroke Mother   . Arthritis Mother     rheumatoid  . Cancer Father     lung  . Hypertension Brother   . Cancer Brother     throat, leukemia  . Parkinson's disease Brother     Social history:  reports that she has never smoked. She does not have any smokeless tobacco history on file. Her alcohol and drug histories are not on  file.  Medications:  Prior to Admission medications   Medication Sig Start Date End Date Taking? Authorizing Provider  artificial tears (LACRILUBE) OINT ophthalmic ointment Place into the left eye at bedtime as needed for dry eyes. 01/24/16  Yes Lajean Saver, MD  cephALEXin (KEFLEX) 500 MG capsule Take 1 capsule (500 mg total) by mouth 4 (four) times daily. 01/24/16  Yes Lajean Saver, MD  predniSONE (DELTASONE) 20 MG tablet Take 3 tablets (60 mg total) by mouth daily. 01/24/16  Yes Lajean Saver, MD  triamterene-hydrochlorothiazide (MAXZIDE-25) 37.5-25 MG tablet TAKE 1 TABLET BY MOUTH DAILY. 11/21/15  Yes Unk Pinto, MD  triamterene-hydrochlorothiazide (MAXZIDE-25) 37.5-25 MG tablet TAKE 1 TABLET BY MOUTH DAILY. 11/30/15  Yes Unk Pinto, MD  valACYclovir (VALTREX) 1000 MG tablet Take 1 tablet (1,000 mg total) by mouth 3 (three) times daily. 01/24/16 02/07/16 Yes Lajean Saver, MD  azithromycin (ZITHROMAX Z-PAK) 250 MG tablet 2 po day one, then 1 daily x 4 days Patient not taking: Reported on 01/24/2016 08/16/15   Loma Sousa Forcucci, PA-C  mometasone (NASONEX) 50 MCG/ACT nasal spray Place 2 sprays into the nose daily. Patient not taking: Reported on 01/24/2016 08/16/15 08/15/16  Starlyn Skeans, PA-C  Phenyleph-Promethazine-Cod 5-6.25-10 MG/5ML SYRP Take  5 mLs by mouth at bedtime as needed. Patient not taking: Reported on 01/24/2016 08/16/15   Starlyn Skeans, PA-C     No Known Allergies  ROS:  Out of a complete 14 system review of symptoms, the patient complains only of the following symptoms, and all other reviewed systems are negative.  Blurred vision Anxiety  Blood pressure 153/82, pulse 84, resp. rate 16, height 5\' 7"  (1.702 m), weight 181 lb (82.101 kg).  Physical Exam  General: The patient is alert and cooperative at the time of the examination.  Eyes: Pupils are equal, round, and reactive to light. Discs are flat bilaterally.  Ears: Tympanic membranes are clear  bilaterally.  Neck: The neck is supple, no carotid bruits are noted.  Respiratory: The respiratory examination is clear.  Cardiovascular: The cardiovascular examination reveals a regular rate and rhythm, no obvious murmurs or rubs are noted.  Skin: Extremities are without significant edema.  Neurologic Exam  Mental status: The patient is alert and oriented x 3 at the time of the examination. The patient has apparent normal recent and remote memory, with an apparently normal attention span and concentration ability.  Cranial nerves: Facial symmetry is not present. The interpalpebral fissure for the left eye is less than the right. The patient has asymmetric smile, decreased muscle contraction on the left. There is good sensation of the face to pinprick and soft touch on the right, slightly decreased on the left. The strength of the facial muscles and the muscles to head turning and shoulder shrug are normal bilaterally. Speech is well enunciated, no aphasia or dysarthria is noted. Extraocular movements are full. Visual fields are full. The tongue is midline, and the patient has symmetric elevation of the soft palate. No obvious hearing deficits are noted.  Motor: The motor testing reveals 5 over 5 strength of all 4 extremities. Good symmetric motor tone is noted throughout.  Sensory: Sensory testing is intact to pinprick, soft touch, vibration sensation, and position sense on all 4 extremities. No evidence of extinction is noted.  Coordination: Cerebellar testing reveals good finger-nose-finger and heel-to-shin bilaterally.  Gait and station: Gait is normal. Tandem gait is normal. Romberg is negative. No drift is seen.  Reflexes: Deep tendon reflexes are symmetric and normal bilaterally. Toes are downgoing bilaterally.   Assessment/Plan:  1. Left Bell's palsy  The patient is doing quite well at this time, she has already made good improvement within the last week. The patient will have  an excellent prognosis for full recovery. The patient will follow-up through this office if needed.  Jill Alexanders MD 02/04/2016 3:11 PM  [facility] [address] [place], [place] [address]  Phone [phone] Fax [phone]

## 2016-02-05 ENCOUNTER — Other Ambulatory Visit: Payer: Self-pay | Admitting: Internal Medicine

## 2016-03-02 ENCOUNTER — Other Ambulatory Visit: Payer: Self-pay | Admitting: Internal Medicine

## 2016-03-02 ENCOUNTER — Other Ambulatory Visit: Payer: Self-pay | Admitting: *Deleted

## 2016-03-02 DIAGNOSIS — I10 Essential (primary) hypertension: Secondary | ICD-10-CM

## 2016-03-02 MED ORDER — TRIAMTERENE-HCTZ 37.5-25 MG PO TABS: 1.0000 | ORAL_TABLET | Freq: Every day | ORAL | Status: DC

## 2016-03-18 ENCOUNTER — Ambulatory Visit (INDEPENDENT_AMBULATORY_CARE_PROVIDER_SITE_OTHER): Payer: Medicare Other | Admitting: Physician Assistant

## 2016-03-18 ENCOUNTER — Telehealth: Payer: Self-pay

## 2016-03-18 VITALS — BP 128/76 | HR 65 | Temp 97.5°F | Resp 12 | Ht 68.0 in | Wt 182.0 lb

## 2016-03-18 DIAGNOSIS — M10071 Idiopathic gout, right ankle and foot: Secondary | ICD-10-CM

## 2016-03-18 DIAGNOSIS — M109 Gout, unspecified: Secondary | ICD-10-CM

## 2016-03-18 MED ORDER — INDOMETHACIN 50 MG PO CAPS: 50.0000 mg | ORAL_CAPSULE | Freq: Three times a day (TID) | ORAL | Status: DC

## 2016-03-18 MED ORDER — HYDROCODONE-ACETAMINOPHEN 5-325 MG PO TABS: 1.0000 | ORAL_TABLET | Freq: Four times a day (QID) | ORAL | Status: DC | PRN

## 2016-03-18 NOTE — Patient Instructions (Addendum)
   IF you received an x-ray today, you will receive an invoice from [facility]. Please contact [facility] at [phone] with questions or concerns regarding your invoice.   IF you received labwork today, you will receive an invoice from [facility]. Please contact [facility] at [phone] with questions or concerns regarding your invoice.   Our billing staff will not be able to assist you with questions regarding bills from these companies.  You will be contacted with the lab results as soon as they are available. The fastest way to get your results is to activate your My Chart account. Instructions are located on the last page of this paperwork. If you have not heard from us regarding the results in 2 weeks, please contact this office.     Gout Gout is an inflammatory arthritis caused by a buildup of uric acid crystals in the joints. Uric acid is a chemical that is normally present in the blood. When the level of uric acid in the blood is too high it can form crystals that deposit in your joints and tissues. This causes joint redness, soreness, and swelling (inflammation). Repeat attacks are common. Over time, uric acid crystals can form into masses (tophi) near a joint, destroying bone and causing disfigurement. Gout is treatable and often preventable. CAUSES  The disease begins with elevated levels of uric acid in the blood. Uric acid is produced by your body when it breaks down a naturally found substance called purines. Certain foods you eat, such as meats and fish, contain high amounts of purines. Causes of an elevated uric acid level include:  Being passed down from parent to child (heredity).  Diseases that cause increased uric acid production (such as obesity, psoriasis, and certain cancers).  Excessive alcohol use.  Diet, especially diets rich in meat and seafood.  Medicines, including certain cancer-fighting medicines  (chemotherapy), water pills (diuretics), and aspirin.  Chronic kidney disease. The kidneys are no longer able to remove uric acid well.  Problems with metabolism. Conditions strongly associated with gout include:  Obesity.  High blood pressure.  High cholesterol.  Diabetes. Not everyone with elevated uric acid levels gets gout. It is not understood why some people get gout and others do not. Surgery, joint injury, and eating too much of certain foods are some of the factors that can lead to gout attacks. SYMPTOMS   An attack of gout comes on quickly. It causes intense pain with redness, swelling, and warmth in a joint.  Fever can occur.  Often, only one joint is involved. Certain joints are more commonly involved:  Base of the big toe.  Knee.  Ankle.  Wrist.  Finger. Without treatment, an attack usually goes away in a few days to weeks. Between attacks, you usually will not have symptoms, which is different from many other forms of arthritis. DIAGNOSIS  Your caregiver will suspect gout based on your symptoms and exam. In some cases, tests may be recommended. The tests may include:  Blood tests.  Urine tests.  X-rays.  Joint fluid exam. This exam requires a needle to remove fluid from the joint (arthrocentesis). Using a microscope, gout is confirmed when uric acid crystals are seen in the joint fluid. TREATMENT  There are two phases to gout treatment: treating the sudden onset (acute) attack and preventing attacks (prophylaxis).  Treatment of an Acute Attack.  Medicines are used. These include anti-inflammatory medicines or steroid medicines.  An injection of steroid medicine into the affected   joint is sometimes necessary.  The painful joint is rested. Movement can worsen the arthritis.  You may use warm or cold treatments on painful joints, depending which works best for you.  Treatment to Prevent Attacks.  If you suffer from frequent gout attacks, your  caregiver may advise preventive medicine. These medicines are started after the acute attack subsides. These medicines either help your kidneys eliminate uric acid from your body or decrease your uric acid production. You may need to stay on these medicines for a very long time.  The early phase of treatment with preventive medicine can be associated with an increase in acute gout attacks. For this reason, during the first few months of treatment, your caregiver may also advise you to take medicines usually used for acute gout treatment. Be sure you understand your caregiver's directions. Your caregiver may make several adjustments to your medicine dose before these medicines are effective.  Discuss dietary treatment with your caregiver or dietitian. Alcohol and drinks high in sugar and fructose and foods such as meat, poultry, and seafood can increase uric acid levels. Your caregiver or dietitian can advise you on drinks and foods that should be limited. HOME CARE INSTRUCTIONS   Do not take aspirin to relieve pain. This raises uric acid levels.  Only take over-the-counter or prescription medicines for pain, discomfort, or fever as directed by your caregiver.  Rest the joint as much as possible. When in bed, keep sheets and blankets off painful areas.  Keep the affected joint raised (elevated).  Apply warm or cold treatments to painful joints. Use of warm or cold treatments depends on which works best for you.  Use crutches if the painful joint is in your leg.  Drink enough fluids to keep your urine clear or pale yellow. This helps your body get rid of uric acid. Limit alcohol, sugary drinks, and fructose drinks.  Follow your dietary instructions. Pay careful attention to the amount of protein you eat. Your daily diet should emphasize fruits, vegetables, whole grains, and fat-free or low-fat milk products. Discuss the use of coffee, vitamin C, and cherries with your caregiver or dietitian. These  may be helpful in lowering uric acid levels.  Maintain a healthy body weight. SEEK MEDICAL CARE IF:   You develop diarrhea, vomiting, or any side effects from medicines.  You do not feel better in 24 hours, or you are getting worse. SEEK IMMEDIATE MEDICAL CARE IF:   Your joint becomes suddenly more tender, and you have chills or a fever. MAKE SURE YOU:   Understand these instructions.  Will watch your condition.  Will get help right away if you are not doing well or get worse.   This information is not intended to replace advice given to you by your health care provider. Make sure you discuss any questions you have with your health care provider.   Document Released: 11/10/2000 Document Revised: 12/04/2014 Document Reviewed: 06/26/2012 Elsevier Interactive Patient Education 2016 Elsevier Inc.  

## 2016-03-18 NOTE — Telephone Encounter (Signed)
Called pharmacy per Judson Roch.  Pt not taking this long term.  She is only going to be on the indomethacin for 3-4 days only.

## 2016-03-18 NOTE — Progress Notes (Signed)
   BAO MOOREFIELD  MRN: [id number] DOB: 21-Jan-1951  Subjective:  Pt presents to clinic with gout in her right - 4 days ago started - she took Aleve and if did not help that much.  She has had gout many times and she knows that this is what it is - she is unable to tolerate and weight on her toe.  She knows prior to this attack she had eaten seafood and drank beer and she knows that can trigger her gout. She has been on Colchicine but it made her stomach hurt really bad.  She has hydrocodone that she has used very sparingly and she was wondering if she could have a refill.  Patient Active Problem List   Diagnosis Date Noted  . Bell palsy 02/04/2016  . Hypertension   . Hyperlipidemia   . Allergy   . Anxiety   . Plantar fasciitis   . Unspecified essential hypertension 11/10/2013    Current Outpatient Prescriptions on File Prior to Visit  Medication Sig Dispense Refill  . triamterene-hydrochlorothiazide (MAXZIDE-25) 37.5-25 MG tablet TAKE 1 TABLET BY MOUTH DAILY. 90 tablet 0   No current facility-administered medications on file prior to visit.    No Known Allergies  Review of Systems  Constitutional: Negative for fever and chills.  Musculoskeletal: Positive for gait problem (2nd to pain).   Objective:  BP 128/76 mmHg  Pulse 65  Temp(Src) 97.5 F (36.4 C) (Oral)  Resp 12  Ht 5\' 8"  (1.727 m)  Wt 182 lb (82.555 kg)  BMI 27.68 kg/m2  SpO2 96%  Physical Exam  Constitutional: She is oriented to person, place, and time and well-developed, well-nourished, and in no distress.  HENT:  Head: Normocephalic and atraumatic.  Right Ear: Hearing and external ear normal.  Left Ear: Hearing and external ear normal.  Eyes: Conjunctivae are normal.  Neck: Normal range of motion.  Pulmonary/Chest: Effort normal.  Neurological: She is alert and oriented to person, place, and time. Gait normal.  Skin: Skin is warm and dry.  Right MTP red, swollen and warm and TTP  Psychiatric: Mood, memory,  affect and judgment normal.  Vitals reviewed.   Assessment and Plan :  Acute gout of right foot, unspecified cause - Plan: indomethacin (INDOCIN) 50 MG capsule, HYDROcodone-acetaminophen (NORCO/VICODIN) 5-325 MG tablet   Pt to start medication and we will give her enough to last a year.  She knows her triggers and she tries to watch them. She does not want to be on a medication daily due to low occurrence of the gout.  Windell Hummingbird PA-C  [facility] 03/18/2016 1:13 PM

## 2016-03-18 NOTE — Telephone Encounter (Signed)
Pt. And pharmacy called concerned about the medication that Sarah prescribed for her gout medication. They concerned how it will interact with her blood pressure medication. Phamracy number is [phone]

## 2016-03-19 ENCOUNTER — Telehealth: Payer: Self-pay | Admitting: Family Medicine

## 2016-03-19 NOTE — Telephone Encounter (Signed)
Pt called wanting you to send her something else for Gout the medicine made her sick she was feeling strange dizziness and some nausea she's highly sensitive to a lot of medicine please sent it to [facility] on [address] please call went sent to pharmacy at [phone]

## 2016-03-20 NOTE — Telephone Encounter (Signed)
Patient is calling because she hasn't heard anything back. She asked if I could she could talk to [facility] before she leaves the office today. [place] on [address]

## 2016-03-21 MED ORDER — NAPROXEN 500 MG PO TABS: 500.0000 mg | ORAL_TABLET | Freq: Two times a day (BID) | ORAL | Status: DC

## 2016-03-21 NOTE — Telephone Encounter (Signed)
Spoke with pt and she was advised

## 2016-03-21 NOTE — Telephone Encounter (Signed)
Please call the patient - At this time she cannot take any NSAIDs or prednisone because of her upcoming test - I will send in some Naproxen which is another good medication for gout and lets see if she tolerates that better with her next attack.

## 2016-03-27 ENCOUNTER — Telehealth: Payer: Self-pay

## 2016-03-27 DIAGNOSIS — K5730 Diverticulosis of large intestine without perforation or abscess without bleeding: Secondary | ICD-10-CM | POA: Diagnosis not present

## 2016-03-27 DIAGNOSIS — Z86010 Personal history of colonic polyps: Secondary | ICD-10-CM | POA: Diagnosis not present

## 2016-03-27 DIAGNOSIS — M109 Gout, unspecified: Secondary | ICD-10-CM

## 2016-03-27 DIAGNOSIS — D125 Benign neoplasm of sigmoid colon: Secondary | ICD-10-CM | POA: Diagnosis not present

## 2016-03-27 LAB — HM COLONOSCOPY

## 2016-03-27 NOTE — Telephone Encounter (Signed)
Patient states she's been trying different medications for gout. She would like to see if Judson Roch would prescribe colcrys and allopurinol without another visit. Patient is requesting that we call her once the medications are sent. [phone] [facility] on [address]

## 2016-03-29 NOTE — Telephone Encounter (Signed)
I am happy to do that.  She needs to start the allopurinol when her attack is over.  I thought she did not like the Colcrys but if she wants it let me know and I will send it in for her.  She will to check back with me a month after starting the allopurinol to make sure her uric acid is within the normal range

## 2016-03-30 NOTE — Telephone Encounter (Signed)
LMOVM for pt to return call to verify she wants the Colcrys medication.  Then forward to Judson Roch to call in meds.

## 2016-03-31 MED ORDER — ALLOPURINOL 100 MG PO TABS: 100.0000 mg | ORAL_TABLET | Freq: Every day | ORAL | Status: DC

## 2016-03-31 MED ORDER — COLCHICINE 0.6 MG PO TABS: ORAL_TABLET | ORAL | Status: DC

## 2016-03-31 NOTE — Telephone Encounter (Signed)
Done

## 2016-03-31 NOTE — Telephone Encounter (Signed)
Spoke with pt, she states she wants colcrys and allopurinol. SHe know it makes her sick but she still wants it.

## 2016-04-01 DIAGNOSIS — M79671 Pain in right foot: Secondary | ICD-10-CM | POA: Diagnosis not present

## 2016-04-06 ENCOUNTER — Ambulatory Visit: Payer: Self-pay | Admitting: Internal Medicine

## 2016-04-07 DIAGNOSIS — M25474 Effusion, right foot: Secondary | ICD-10-CM | POA: Diagnosis not present

## 2016-04-14 ENCOUNTER — Ambulatory Visit (INDEPENDENT_AMBULATORY_CARE_PROVIDER_SITE_OTHER): Payer: Medicare Other | Admitting: Internal Medicine

## 2016-04-14 ENCOUNTER — Encounter: Payer: Self-pay | Admitting: Internal Medicine

## 2016-04-14 VITALS — BP 126/82 | HR 78 | Temp 98.0°F | Resp 18 | Ht 67.0 in | Wt 179.0 lb

## 2016-04-14 DIAGNOSIS — I10 Essential (primary) hypertension: Secondary | ICD-10-CM

## 2016-04-14 DIAGNOSIS — M109 Gout, unspecified: Secondary | ICD-10-CM

## 2016-04-14 DIAGNOSIS — M10079 Idiopathic gout, unspecified ankle and foot: Secondary | ICD-10-CM

## 2016-04-14 DIAGNOSIS — Z79899 Other long term (current) drug therapy: Secondary | ICD-10-CM | POA: Diagnosis not present

## 2016-04-14 DIAGNOSIS — E785 Hyperlipidemia, unspecified: Secondary | ICD-10-CM | POA: Diagnosis not present

## 2016-04-14 LAB — HEPATIC FUNCTION PANEL
ALBUMIN: 4.7 g/dL (ref 3.6–5.1)
ALK PHOS: 91 U/L (ref 33–130)
ALT: 16 U/L (ref 6–29)
AST: 18 U/L (ref 10–35)
BILIRUBIN DIRECT: 0.1 mg/dL (ref <=0.2)
BILIRUBIN TOTAL: 0.8 mg/dL (ref 0.2–1.2)
Indirect Bilirubin: 0.7 mg/dL (ref 0.2–1.2)
Total Protein: 7.7 g/dL (ref 6.1–8.1)

## 2016-04-14 LAB — CBC WITH DIFFERENTIAL/PLATELET
BASOS ABS: 0 {cells}/uL (ref 0–200)
BASOS PCT: 0 %
EOS ABS: 201 {cells}/uL (ref 15–500)
Eosinophils Relative: 3 %
HCT: 42.8 % (ref 35.0–45.0)
HEMOGLOBIN: 15 g/dL (ref 11.7–15.5)
Lymphocytes Relative: 37 %
Lymphs Abs: 2479 cells/uL (ref 850–3900)
MCH: 30.3 pg (ref 27.0–33.0)
MCHC: 35 g/dL (ref 32.0–36.0)
MCV: 86.5 fL (ref 80.0–100.0)
MONO ABS: 402 {cells}/uL (ref 200–950)
MONOS PCT: 6 %
MPV: 9.1 fL (ref 7.5–12.5)
Neutro Abs: 3618 cells/uL (ref 1500–7800)
Neutrophils Relative %: 54 %
PLATELETS: 167 10*3/uL (ref 140–400)
RBC: 4.95 MIL/uL (ref 3.80–5.10)
RDW: 14.2 % (ref 11.0–15.0)
WBC: 6.7 10*3/uL (ref 3.8–10.8)

## 2016-04-14 LAB — TSH: TSH: 1.12 m[IU]/L

## 2016-04-14 LAB — BASIC METABOLIC PANEL WITH GFR
BUN: 14 mg/dL (ref 7–25)
CHLORIDE: 98 mmol/L (ref 98–110)
CO2: 28 mmol/L (ref 20–31)
Calcium: 9.8 mg/dL (ref 8.6–10.4)
Creat: 0.81 mg/dL (ref 0.50–0.99)
GFR, Est African American: 88 mL/min (ref >=60)
GFR, Est Non African American: 76 mL/min (ref >=60)
Glucose, Bld: 92 mg/dL (ref 65–99)
POTASSIUM: 3.8 mmol/L (ref 3.5–5.3)
Sodium: 139 mmol/L (ref 135–146)

## 2016-04-14 LAB — LIPID PANEL
CHOL/HDL RATIO: 6.6 ratio — AB (ref <=5.0)
Cholesterol: 258 mg/dL — ABNORMAL HIGH (ref 125–200)
HDL: 39 mg/dL — ABNORMAL LOW (ref >=46)
Triglycerides: 650 mg/dL — ABNORMAL HIGH (ref <150)

## 2016-04-14 LAB — URIC ACID: URIC ACID, SERUM: 7.8 mg/dL — AB (ref 2.4–7.0)

## 2016-04-14 MED ORDER — TRIAMTERENE-HCTZ 37.5-25 MG PO TABS: 1.0000 | ORAL_TABLET | Freq: Every day | ORAL | Status: DC

## 2016-04-14 MED ORDER — PHENTERMINE HCL 37.5 MG PO TABS: 37.5000 mg | ORAL_TABLET | Freq: Every day | ORAL | Status: DC

## 2016-04-14 MED ORDER — ALLOPURINOL 100 MG PO TABS: 100.0000 mg | ORAL_TABLET | Freq: Every day | ORAL | Status: DC

## 2016-04-14 NOTE — Patient Instructions (Signed)
Phentermine tablets or capsules What is this medicine? PHENTERMINE (FEN ter meen) decreases your appetite. It is used with a reduced calorie diet and exercise to help you lose weight. This medicine may be used for other purposes; ask your health care provider or pharmacist if you have questions. What should I tell my health care provider before I take this medicine? They need to know if you have any of these conditions: -agitation -glaucoma -heart disease -high blood pressure -history of substance abuse -lung disease called Primary Pulmonary Hypertension (PPH) -taken an MAOI like Carbex, Eldepryl, Marplan, Nardil, or Parnate in last 14 days -thyroid disease -an unusual or allergic reaction to phentermine, other medicines, foods, dyes, or preservatives -pregnant or trying to get pregnant -breast-feeding How should I use this medicine? Take this medicine by mouth with a glass of water. Follow the directions on the prescription label. This medicine is usually taken 30 minutes before or 1 to 2 hours after breakfast. Avoid taking this medicine in the evening. It may interfere with sleep. Take your doses at regular intervals. Do not take your medicine more often than directed. Talk to your pediatrician regarding the use of this medicine in children. Special care may be needed. Overdosage: If you think you have taken too much of this medicine contact a poison control center or emergency room at once. NOTE: This medicine is only for you. Do not share this medicine with others. What if I miss a dose? If you miss a dose, take it as soon as you can. If it is almost time for your next dose, take only that dose. Do not take double or extra doses. What may interact with this medicine? Do not take this medicine with any of the following medications: -duloxetine -MAOIs like Carbex, Eldepryl, Marplan, Nardil, and Parnate -medicines for colds or breathing difficulties like pseudoephedrine or  phenylephrine -procarbazine -sibutramine -SSRIs like citalopram, escitalopram, fluoxetine, fluvoxamine, paroxetine, and sertraline -stimulants like dexmethylphenidate, methylphenidate or modafinil -venlafaxine This medicine may also interact with the following medications: -medicines for diabetes This list may not describe all possible interactions. Give your health care provider a list of all the medicines, herbs, non-prescription drugs, or dietary supplements you use. Also tell them if you smoke, drink alcohol, or use illegal drugs. Some items may interact with your medicine. What should I watch for while using this medicine? Notify your physician immediately if you become short of breath while doing your normal activities. Do not take this medicine within 6 hours of bedtime. It can keep you from getting to sleep. Avoid drinks that contain caffeine and try to stick to a regular bedtime every night. This medicine was intended to be used in addition to a healthy diet and exercise. The best results are achieved this way. This medicine is only indicated for short-term use. Eventually your weight loss may level out. At that point, the drug will only help you maintain your new weight. Do not increase or in any way change your dose without consulting your doctor. You may get drowsy or dizzy. Do not drive, use machinery, or do anything that needs mental alertness until you know how this medicine affects you. Do not stand or sit up quickly, especially if you are an older patient. This reduces the risk of dizzy or fainting spells. Alcohol may increase dizziness and drowsiness. Avoid alcoholic drinks. What side effects may I notice from receiving this medicine? Side effects that you should report to your doctor or health care professional as soon  as possible: -chest pain, palpitations -depression or severe changes in mood -increased blood pressure -irritability -nervousness or restlessness -severe  dizziness -shortness of breath -problems urinating -unusual swelling of the legs -vomiting Side effects that usually do not require medical attention (report to your doctor or health care professional if they continue or are bothersome): -blurred vision or other eye problems -changes in sexual ability or desire -constipation or diarrhea -difficulty sleeping -dry mouth or unpleasant taste -headache -nausea This list may not describe all possible side effects. Call your doctor for medical advice about side effects. You may report side effects to FDA at 1-800-FDA-1088. Where should I keep my medicine? Keep out of the reach of children. This medicine can be abused. Keep your medicine in a safe place to protect it from theft. Do not share this medicine with anyone. Selling or giving away this medicine is dangerous and against the law. This medicine may cause accidental overdose and death if taken by other adults, children, or pets. Mix any unused medicine with a substance like cat litter or coffee grounds. Then throw the medicine away in a sealed container like a sealed bag or a coffee can with a lid. Do not use the medicine after the expiration date. Store at room temperature between 20 and 25 degrees C (68 and 77 degrees F). Keep container tightly closed. NOTE: This sheet is a summary. It may not cover all possible information. If you have questions about this medicine, talk to your doctor, pharmacist, or health care provider.    2016, Elsevier/Gold Standard. (2014-08-04 16:19:53)

## 2016-04-14 NOTE — Progress Notes (Signed)
Assessment and Plan:  Hypertension:  -Continue medication,  -monitor blood pressure at home.  -Continue DASH diet.   -Reminder to go to the ER if any CP, SOB, nausea, dizziness, severe HA, changes vision/speech, left arm numbness and tingling, and jaw pain.  Cholesterol: -start phentermine -Continue diet and exercise.  -Check cholesterol.   Pre-diabetes: -start phentermine -Continue diet and exercise.  -Check A1C  Vitamin D Def: -check level -continue medications.   Gout -uric acid level  -cont allopurinol -consider increasing to 300 mg if level not below 6  Continue diet and meds as discussed. Further disposition pending results of labs.  HPI 65 y.o. female  presents for 3 month follow up with hypertension, hyperlipidemia, prediabetes and vitamin D.   Her blood pressure has been controlled at home, today their BP is BP: 126/82 mmHg.   She does not workout. She denies chest pain, shortness of breath, dizziness.   She is not on cholesterol medication and denies myalgias. Her cholesterol is at goal. The cholesterol last visit was:   Lab Results  Component Value Date   CHOL 210* 05/14/2014   HDL 36* 05/14/2014   LDLCALC NOT CALC 05/14/2014   TRIG 481* 05/14/2014   CHOLHDL 5.8 05/14/2014     She has been working on diet and exercise for prediabetes, and denies foot ulcerations, hyperglycemia, hypoglycemia , increased appetite, nausea, paresthesia of the feet, polydipsia, polyuria, visual disturbances, vomiting and weight loss. Last A1C in the office was:  Lab Results  Component Value Date   HGBA1C 5.7* 05/14/2014    Patient is on Vitamin D supplement.  Lab Results  Component Value Date   VD25OH 31 05/14/2014     Patient reports that she has been having a lot of issues with both gout and also a recent bout with Bells Palsy.  She reports that the orthopedist put her on allopurinol.  She saw Dr. Melvyn Novas.      Current Medications:  Current Outpatient Prescriptions  on File Prior to Visit  Medication Sig Dispense Refill  . allopurinol (ZYLOPRIM) 100 MG tablet Take 1 tablet (100 mg total) by mouth daily. 30 tablet 1  . HYDROcodone-acetaminophen (NORCO/VICODIN) 5-325 MG tablet Take 1 tablet by mouth every 6 (six) hours as needed for moderate pain. 20 tablet 0  . naproxen (NAPROSYN) 500 MG tablet Take 1 tablet (500 mg total) by mouth 2 (two) times daily with a meal. 30 tablet 0  . triamterene-hydrochlorothiazide (MAXZIDE-25) 37.5-25 MG tablet TAKE 1 TABLET BY MOUTH DAILY. 90 tablet 0   No current facility-administered medications on file prior to visit.    Medical History:  Past Medical History  Diagnosis Date  . Hyperlipidemia   . Allergy   . Anxiety   . Plantar fasciitis   . Hypertension 2005  . Cancer ([place]) 2011    BCC  . Bell palsy 02/04/2016    Left    Allergies: No Known Allergies   Review of Systems:  Review of Systems  Constitutional: Negative for fever, chills and malaise/fatigue.  HENT: Negative for congestion, ear pain and sore throat.   Eyes: Negative.   Respiratory: Negative for cough, shortness of breath and wheezing.   Cardiovascular: Negative for chest pain, palpitations and leg swelling.  Gastrointestinal: Negative for heartburn, abdominal pain, diarrhea, constipation, blood in stool and melena.  Genitourinary: Negative.   Musculoskeletal: Positive for joint pain.  Skin: Negative.   Neurological: Negative for dizziness, sensory change, loss of consciousness and headaches.  Psychiatric/Behavioral: Negative for  depression. The patient is not nervous/anxious and does not have insomnia.     Family history- Review and unchanged  Social history- Review and unchanged  Physical Exam: BP 126/82 mmHg  Pulse 78  Temp(Src) 98 F (36.7 C) (Temporal)  Resp 18  Ht 5\' 7"  (1.702 m)  Wt 179 lb (81.194 kg)  BMI 28.03 kg/m2 Wt Readings from Last 3 Encounters:  04/14/16 179 lb (81.194 kg)  03/18/16 182 lb (82.555 kg)  02/04/16  181 lb (82.101 kg)    General Appearance: Well nourished well developed, in no apparent distress. Eyes: PERRLA, EOMs, conjunctiva no swelling or erythema ENT/Mouth: Ear canals normal without obstruction, swelling, erythma, discharge.  TMs normal bilaterally.  Oropharynx moist, clear, without exudate, or postoropharyngeal swelling. Neck: Supple, thyroid normal,no cervical adenopathy  Respiratory: Respiratory effort normal, Breath sounds clear A&P without rhonchi, wheeze, or rale.  No retractions, no accessory usage. Cardio: RRR with no MRGs. Brisk peripheral pulses without edema.  Abdomen: Soft, + BS,  Non tender, no guarding, rebound, hernias, masses. Musculoskeletal: Full ROM, 5/5 strength, Normal gait Skin: Warm, dry without rashes, lesions, ecchymosis.  Neuro: Awake and oriented X 3, Cranial nerves intact. Normal muscle tone, no cerebellar symptoms. Psych: Normal affect, Insight and Judgment appropriate.    Starlyn Skeans, PA-C 9:34 AM [facility]

## 2016-04-18 ENCOUNTER — Encounter: Payer: Self-pay | Admitting: Internal Medicine

## 2016-04-19 ENCOUNTER — Other Ambulatory Visit: Payer: Self-pay | Admitting: Internal Medicine

## 2016-04-19 MED ORDER — PRAVASTATIN SODIUM 40 MG PO TABS: ORAL_TABLET | ORAL | Status: DC

## 2016-04-20 ENCOUNTER — Other Ambulatory Visit: Payer: Self-pay | Admitting: Internal Medicine

## 2016-04-20 DIAGNOSIS — Z23 Encounter for immunization: Secondary | ICD-10-CM | POA: Diagnosis not present

## 2016-04-20 MED ORDER — PRAVASTATIN SODIUM 20 MG PO TABS: ORAL_TABLET | ORAL | Status: DC

## 2016-04-24 ENCOUNTER — Other Ambulatory Visit: Payer: Self-pay | Admitting: Internal Medicine

## 2016-04-24 MED ORDER — PRAVASTATIN SODIUM 20 MG PO TABS: 20.0000 mg | ORAL_TABLET | Freq: Every day | ORAL | Status: DC

## 2016-04-25 ENCOUNTER — Encounter: Payer: Self-pay | Admitting: Physician Assistant

## 2016-05-19 ENCOUNTER — Ambulatory Visit: Payer: Self-pay | Admitting: Internal Medicine

## 2016-05-19 ENCOUNTER — Encounter: Payer: Self-pay | Admitting: Internal Medicine

## 2016-05-19 ENCOUNTER — Ambulatory Visit (INDEPENDENT_AMBULATORY_CARE_PROVIDER_SITE_OTHER): Payer: Medicare Other | Admitting: Internal Medicine

## 2016-05-19 VITALS — BP 124/62 | HR 74 | Temp 98.0°F | Resp 18 | Ht 67.0 in | Wt 172.0 lb

## 2016-05-19 DIAGNOSIS — Z0000 Encounter for general adult medical examination without abnormal findings: Secondary | ICD-10-CM

## 2016-05-19 DIAGNOSIS — E663 Overweight: Secondary | ICD-10-CM | POA: Insufficient documentation

## 2016-05-19 DIAGNOSIS — M109 Gout, unspecified: Secondary | ICD-10-CM | POA: Insufficient documentation

## 2016-05-19 DIAGNOSIS — F419 Anxiety disorder, unspecified: Secondary | ICD-10-CM | POA: Diagnosis not present

## 2016-05-19 DIAGNOSIS — T7840XD Allergy, unspecified, subsequent encounter: Secondary | ICD-10-CM

## 2016-05-19 DIAGNOSIS — Z6825 Body mass index (BMI) 25.0-25.9, adult: Secondary | ICD-10-CM | POA: Insufficient documentation

## 2016-05-19 DIAGNOSIS — M1000 Idiopathic gout, unspecified site: Secondary | ICD-10-CM | POA: Diagnosis not present

## 2016-05-19 DIAGNOSIS — Z8669 Personal history of other diseases of the nervous system and sense organs: Secondary | ICD-10-CM | POA: Diagnosis not present

## 2016-05-19 DIAGNOSIS — I10 Essential (primary) hypertension: Secondary | ICD-10-CM

## 2016-05-19 DIAGNOSIS — Z6823 Body mass index (BMI) 23.0-23.9, adult: Secondary | ICD-10-CM | POA: Insufficient documentation

## 2016-05-19 DIAGNOSIS — E2839 Other primary ovarian failure: Secondary | ICD-10-CM

## 2016-05-19 DIAGNOSIS — M1009 Idiopathic gout, multiple sites: Secondary | ICD-10-CM

## 2016-05-19 DIAGNOSIS — Z6826 Body mass index (BMI) 26.0-26.9, adult: Secondary | ICD-10-CM

## 2016-05-19 DIAGNOSIS — E782 Mixed hyperlipidemia: Secondary | ICD-10-CM

## 2016-05-19 DIAGNOSIS — E785 Hyperlipidemia, unspecified: Secondary | ICD-10-CM | POA: Diagnosis not present

## 2016-05-19 DIAGNOSIS — R7303 Prediabetes: Secondary | ICD-10-CM | POA: Diagnosis not present

## 2016-05-19 HISTORY — DX: Gout, unspecified: M10.9

## 2016-05-19 LAB — LIPID PANEL
Cholesterol: 161 mg/dL (ref 125–200)
HDL: 36 mg/dL — AB (ref >=46)
LDL CALC: 68 mg/dL (ref <130)
TRIGLYCERIDES: 285 mg/dL — AB (ref <150)
Total CHOL/HDL Ratio: 4.5 Ratio (ref <=5.0)
VLDL: 57 mg/dL — AB (ref <30)

## 2016-05-19 LAB — URIC ACID: Uric Acid, Serum: 6 mg/dL (ref 2.5–7.0)

## 2016-05-19 NOTE — Progress Notes (Signed)
WELCOME TO MEDICARE ANNUAL WELLNESS VISIT AND FOLLOW UP  Assessment:    1. Routine general medical examination at a health care facility -due next year -UTD on screening exams and vaccines -patient to notify us which PNA vaccine she got at [facility] so we can update system  2. Uric acid arthropathy -cont allopurinol -also followed by Dr. Gladstone Lighter - Uric acid  3. Mixed hyperlipidemia -cont pravastatin -goal for LDL < 70 - Lipid panel  4. Estrogen deficiency -to be scheduled at [facility] and done with Mammogram - DG Bone Density; Future  5. Hypertension, benign essential, goal below 140/90 -cont maxzide -monitor at home -DASH diet -exercise as tolerated  6. History of Bell's palsy -followed by Dr. Jannifer Franklin -resolved  7. Prediabetes -cont diet and exercise -phentermine currently being used  8. BMI 26.0-26.9,adult -cont diet and exercise -cont phentermine for period of 3-6 months and the take a holiday  9. Allergy, subsequent encounter -cont antihistamines prn  10. Anxiety -currently not medicated for this issue -depression screen negative  11. Gout of big toe -cont allopurinol  Over 30 minutes of exam, counseling, chart review, and critical decision making was performed  No future appointments.  Plan:   During the course of the visit the patient was educated and counseled about appropriate screening and preventive services including:    Pneumococcal vaccine   Influenza vaccine  Td vaccine  Prevnar 13  Screening electrocardiogram  Screening mammography  Bone densitometry screening  Colorectal cancer screening  Diabetes screening  Glaucoma screening  Nutrition counseling   Advanced directives: given info/requested copies   Subjective:   [name] is a 65 y.o. female who presents for Welcome to medicare visit and 1 month follow up on gout and hyperlipidemia with recent  Medications changes.   Her blood pressure has been  controlled at home, today their BP is BP: 124/62 mmHg She does workout. She denies chest pain, shortness of breath, dizziness.  She is on cholesterol medication and denies myalgias. Her cholesterol is not at goal. The cholesterol last visit was:   Lab Results  Component Value Date   CHOL 258* 04/14/2016   HDL 39* 04/14/2016   LDLCALC NOT CALC 04/14/2016   TRIG 650* 04/14/2016   CHOLHDL 6.6* 04/14/2016   She has been working on diet and exercise for prediabetes, and denies foot ulcerations, hyperglycemia, hypoglycemia , increased appetite, nausea, paresthesia of the feet, polydipsia, polyuria, visual disturbances, vomiting and weight loss. Last A1C in the office was:  Lab Results  Component Value Date   HGBA1C 5.7* 05/14/2014  Since her last visit she has completely cut out soft drinks and has lost about 10 lbs.    Last GFR Lab Results  Component Value Date   GFRNONAA 76 04/14/2016   Lab Results  Component Value Date   GFRAA 88 04/14/2016   Patient is on Vitamin D supplement. Lab Results  Component Value Date   VD25OH 31 05/14/2014     Patient reports that she is taking allopurinol 100 mg daily.  She reports that she is doing better with it.  Initially it was making her have an upset stomach.  No Gout flares noted.  She reports that she is taking pravastatin 3 times per week.  She reports that the medications are making her feel a little sluggish but does think that this is improving.    Medication Review Current Outpatient Prescriptions on File Prior to Visit  Medication Sig Dispense Refill  . allopurinol (ZYLOPRIM)  100 MG tablet Take 1 tablet (100 mg total) by mouth daily. 90 tablet 1  . phentermine (ADIPEX-P) 37.5 MG tablet Take 1 tablet (37.5 mg total) by mouth daily before breakfast. 30 tablet 2  . pravastatin (PRAVACHOL) 20 MG tablet Take 1 tablet (20 mg total) by mouth daily. 90 tablet 2  . triamterene-hydrochlorothiazide (MAXZIDE-25) 37.5-25 MG tablet Take 1 tablet by  mouth daily. 90 tablet 1   No current facility-administered medications on file prior to visit.    Allergies: No Known Allergies  Current Problems (verified) has Unspecified essential hypertension; Hypertension; Hyperlipidemia; Allergy; Anxiety; Plantar fasciitis; Bell palsy; and Pain of right great toe on her problem list.  Screening Tests Immunization History  Administered Date(s) Administered  . PPD Test 05/14/2014    Preventative care: Last colonoscopy: Mar 27 2016,Dr. Magod Last mammogram:  11/16 Last pap smear/pelvic exam: Hysterctomy, 2014 DEXA:  Prior vaccinations: TD or Tdap: 2011  Influenza: Declined, reaction  Pneumococcal: Done Prevnar13: Due next year Shingles/Zostavax: 2014  Names of Other Physician/Practitioners you currently use: 1. [facility]- here for primary care 2. [facility], eye doctor, last visit 8/16 3. Dr. Illene Bolus , dentist, last visit 6/17 Patient Care Team: Unk Pinto, MD as PCP - General (Internal Medicine) Avon Gully, NP as Nurse Practitioner (Obstetrics and Gynecology) Clarene Essex, MD as Consulting Physician (Gastroenterology) Magnus Sinning, MD as Consulting Physician (Orthopedic Surgery) Suella Broad, MD as Consulting Physician (Physical Medicine and Rehabilitation) Jari Pigg, MD as Consulting Physician (Dermatology)  Surgical: She  has past surgical history that includes Abdominal hysterectomy (1990); Tonsillectomy; Tendon repair; and basal cell carcinoma (2011). Family Her family history includes Arthritis in her mother; Cancer in her brother and father; Heart attack in her mother; Heart disease (age of onset: 2) in her mother; Hyperlipidemia in her mother; Hypertension in her brother and mother; Parkinson's disease in her brother; Stroke in her mother. Social history  She reports that she has never smoked. She does not have any smokeless tobacco history on file. Her  alcohol and drug histories are not on file.  MEDICARE WELLNESS OBJECTIVES: Physical activity: Current Exercise Habits: The patient does not participate in regular exercise at present Cardiac risk factors: Cardiac Risk Factors include: advanced age (>39men, >76 women);dyslipidemia;family history of premature cardiovascular disease;sedentary lifestyle Depression/mood screen:   Depression screen [facility] 2/9 05/19/2016  Decreased Interest 0  Down, Depressed, Hopeless 0  PHQ - 2 Score 0    ADLs:  In your present state of health, do you have any difficulty performing the following activities: 05/19/2016  Hearing? N  Vision? N  Difficulty concentrating or making decisions? N  Walking or climbing stairs? N  Dressing or bathing? N  Doing errands, shopping? N  Preparing Food and eating ? N  Using the Toilet? N  In the past six months, have you accidently leaked urine? N  Do you have problems with loss of bowel control? N  Managing your Medications? N  Managing your Finances? N  Housekeeping or managing your Housekeeping? N     Cognitive Testing  Alert? Yes  Normal Appearance?Yes  Oriented to person? Yes  Place? Yes   Time? Yes  Recall of three objects?  Yes  Can perform simple calculations? Yes  Displays appropriate judgment?Yes  Can read the correct time from a watch face?Yes  EOL planning: Does patient have an advance directive?: No Would patient like information on creating an advanced directive?: Yes - Educational materials given  Objective:   Today's Vitals   05/19/16 1057  BP: 124/62  Pulse: 74  Temp: 98 F (36.7 C)  TempSrc: Temporal  Resp: 18  Height: 5\' 7"  (1.702 m)  Weight: 172 lb (78.019 kg)   Wt Readings from Last 3 Encounters:  05/19/16 172 lb (78.019 kg)  04/14/16 179 lb (81.194 kg)  03/18/16 182 lb (82.555 kg)    Body mass index is 26.93 kg/(m^2).  General appearance: alert, no distress, WD/WN,  female HEENT: normocephalic, sclerae anicteric, TMs pearly,  nares patent, no discharge or erythema, pharynx normal Oral cavity: MMM, no lesions Neck: supple, no lymphadenopathy, no thyromegaly, no masses Heart: RRR, normal S1, S2, no murmurs Lungs: CTA bilaterally, no wheezes, rhonchi, or rales Abdomen: +bs, soft, non tender, non distended, no masses, no hepatomegaly, no splenomegaly Musculoskeletal: nontender, no swelling, no obvious deformity Extremities: no edema, no cyanosis, no clubbing Pulses: 2+ symmetric, upper and lower extremities, normal cap refill Neurological: alert, oriented x 3, CN2-12 intact, strength normal upper extremities and lower extremities, sensation normal throughout, DTRs 2+ throughout, no cerebellar signs, gait normal Psychiatric: normal affect, behavior normal, pleasant  Breast: defer Gyn: defer Rectal: defer   Medicare Attestation I have personally reviewed: The patient's medical and social history Their use of alcohol, tobacco or illicit drugs Their current medications and supplements The patient's functional ability including ADLs,fall risks, home safety risks, cognitive, and hearing and visual impairment Diet and physical activities Evidence for depression or mood disorders  The patient's weight, height, BMI, and visual acuity have been recorded in the chart.  I have made referrals, counseling, and provided education to the patient based on review of the above and I have provided the patient with a written personalized care plan for preventive services.     Starlyn Skeans, PA-C   05/19/2016

## 2016-05-22 ENCOUNTER — Ambulatory Visit: Payer: Self-pay | Admitting: Internal Medicine

## 2016-06-01 ENCOUNTER — Encounter: Payer: Self-pay | Admitting: Internal Medicine

## 2016-06-02 ENCOUNTER — Other Ambulatory Visit: Payer: Self-pay | Admitting: Internal Medicine

## 2016-06-02 MED ORDER — PREDNISONE 20 MG PO TABS: ORAL_TABLET | ORAL | Status: DC

## 2016-06-30 DIAGNOSIS — M1009 Idiopathic gout, multiple sites: Secondary | ICD-10-CM | POA: Diagnosis not present

## 2016-06-30 DIAGNOSIS — M2011 Hallux valgus (acquired), right foot: Secondary | ICD-10-CM | POA: Diagnosis not present

## 2016-07-01 ENCOUNTER — Ambulatory Visit (HOSPITAL_COMMUNITY)
Admission: EM | Admit: 2016-07-01 | Discharge: 2016-07-01 | Disposition: A | Discharge status: Home / Self Care | Payer: Medicare Other | Attending: Emergency Medicine | Admitting: Emergency Medicine

## 2016-07-01 ENCOUNTER — Encounter (HOSPITAL_COMMUNITY): Payer: Self-pay | Admitting: Emergency Medicine

## 2016-07-01 DIAGNOSIS — R591 Generalized enlarged lymph nodes: Secondary | ICD-10-CM

## 2016-07-01 DIAGNOSIS — R599 Enlarged lymph nodes, unspecified: Secondary | ICD-10-CM

## 2016-07-01 DIAGNOSIS — H9201 Otalgia, right ear: Secondary | ICD-10-CM

## 2016-07-01 MED ORDER — CEPHALEXIN 250 MG PO CAPS: 250.0000 mg | ORAL_CAPSULE | Freq: Four times a day (QID) | ORAL | 0 refills | Status: DC

## 2016-07-01 NOTE — ED Triage Notes (Signed)
The patient presented to the [facility] with a complaint of right side ear pain x 2 days.

## 2016-07-01 NOTE — ED Provider Notes (Signed)
CSN: [id number]     Arrival date & time 07/01/16  1828 History   First MD Initiated Contact with Patient 07/01/16 1914     Chief Complaint  Patient presents with  . Otalgia   (Consider location/radiation/quality/duration/timing/severity/associated sxs/prior Treatment) HPI Patient is a 65 year old female who developed right ear pain yesterday. It is associated with a small node posteriorly of the ear that is sore and is bothering her a bit. Denies any recent URI symptoms. Denies any fever or chills. No cough or sinus issues she states that she does have a bit of congestion now. Past Medical History:  Diagnosis Date  . Allergy   . Anxiety   . Bell palsy 02/04/2016   Left  . Cancer ([place]) 2011   BCC  . Hyperlipidemia   . Hypertension 2005  . Plantar fasciitis    Past Surgical History:  Procedure Laterality Date  . ABDOMINAL HYSTERECTOMY  1990  . basal cell carcinoma  2011   nose  . TENDON REPAIR     plantar fasciitis  . TONSILLECTOMY     Family History  Problem Relation Age of Onset  . Hypertension Mother   . Heart attack Mother   . Hyperlipidemia Mother   . Heart disease Mother 21    fatal MI  . Stroke Mother   . Arthritis Mother     rheumatoid  . Hypertension Brother   . Cancer Brother     throat, leukemia  . Parkinson's disease Brother   . Cancer Father     lung   Social History  Substance Use Topics  . Smoking status: Never Smoker  . Smokeless tobacco: Never Used  . Alcohol use Not on file   OB History    No data available     Review of Systems  Denies: HEADACHE, NAUSEA, ABDOMINAL PAIN, CHEST PAIN, CONGESTION, DYSURIA, SHORTNESS OF BREATH  Allergies  Review of patient's allergies indicates no known allergies.  Home Medications   Prior to Admission medications   Medication Sig Start Date End Date Taking? Authorizing Provider  allopurinol (ZYLOPRIM) 100 MG tablet Take 1 tablet (100 mg total) by mouth daily. 04/14/16  Yes Courtney Forcucci, PA-C   pravastatin (PRAVACHOL) 20 MG tablet Take 1 tablet (20 mg total) by mouth daily. 04/24/16  Yes Courtney Forcucci, PA-C  triamterene-hydrochlorothiazide (MAXZIDE-25) 37.5-25 MG tablet Take 1 tablet by mouth daily. 04/14/16  Yes Courtney Forcucci, PA-C  cephALEXin (KEFLEX) 250 MG capsule Take 1 capsule (250 mg total) by mouth 4 (four) times daily. 07/01/16   Konrad Felix, PA  phentermine (ADIPEX-P) 37.5 MG tablet Take 1 tablet (37.5 mg total) by mouth daily before breakfast. 04/14/16   Starlyn Skeans, PA-C  predniSONE (DELTASONE) 20 MG tablet 3 tabs po daily x 3 days, then 2 tabs x 3 days, then 1.5 tabs x 3 days, then 1 tab x 3 days, then 0.5 tabs x 3 days 06/02/16   Starlyn Skeans, PA-C   Meds Ordered and Administered this Visit  Medications - No data to display  BP 149/79 (BP Location: Right Arm)   Pulse 87   Temp 98.4 F (36.9 C) (Oral)   Resp 18   SpO2 96%  No data found.   Physical Exam NURSES NOTES AND VITAL SIGNS REVIEWED. CONSTITUTIONAL: Well developed, well nourished, no acute distress HEENT: normocephalic, atraumatic, right ear TM and EAC are both clear. There is a small pea-sized node postauricular area. Minimally tender to palpation. Mobile. Left ear is clear and normal. EYES:  Conjunctiva normal NECK:normal ROM, supple, no adenopathy PULMONARY:No respiratory distress, normal effort ABDOMINAL: Soft, ND, NT BS+, No CVAT MUSCULOSKELETAL: Normal ROM of all extremities,  SKIN: warm and dry without rash PSYCHIATRIC: Mood and affect, behavior are normal  Urgent Care Course   Clinical Course    Procedures (including critical care time)  Labs Review Labs Reviewed - No data to display  Imaging Review No results found.   Visual Acuity Review  Right Eye Distance:   Left Eye Distance:   Bilateral Distance:    Right Eye Near:   Left Eye Near:    Bilateral Near:        Prescription for Keflex is provided along with instructions to apply moist heat she is also  advised to follow-up with her primary care provider if not steadily getting better next week. MDM   1. Otalgia, right   2. Enlarged lymph nodes     Patient is reassured that there are no issues that require transfer to higher level of care at this time or additional tests. Patient is advised to continue home symptomatic treatment. Patient is advised that if there are new or worsening symptoms to attend the emergency department, contact primary care provider, or return to UC. Instructions of care provided discharged home in stable condition.    THIS NOTE WAS GENERATED USING A VOICE RECOGNITION SOFTWARE PROGRAM. ALL REASONABLE EFFORTS  WERE MADE TO PROOFREAD THIS DOCUMENT FOR ACCURACY.  I have verbally reviewed the discharge instructions with the patient. A printed AVS was given to the patient.  All questions were answered prior to discharge.      Konrad Felix, PA 07/01/16 Joen Laura

## 2016-07-10 ENCOUNTER — Encounter: Payer: Self-pay | Admitting: Internal Medicine

## 2016-07-10 ENCOUNTER — Ambulatory Visit (INDEPENDENT_AMBULATORY_CARE_PROVIDER_SITE_OTHER): Payer: Medicare Other | Admitting: Internal Medicine

## 2016-07-10 VITALS — BP 128/80 | HR 90 | Temp 98.0°F | Resp 16 | Ht 67.0 in | Wt 165.0 lb

## 2016-07-10 DIAGNOSIS — H6591 Unspecified nonsuppurative otitis media, right ear: Secondary | ICD-10-CM

## 2016-07-10 DIAGNOSIS — J309 Allergic rhinitis, unspecified: Secondary | ICD-10-CM

## 2016-07-10 MED ORDER — FLUTICASONE PROPIONATE 50 MCG/ACT NA SUSP: 2.0000 | Freq: Every day | NASAL | 2 refills | Status: DC

## 2016-07-10 MED ORDER — PREDNISONE 20 MG PO TABS: ORAL_TABLET | ORAL | 0 refills | Status: DC

## 2016-07-10 NOTE — Patient Instructions (Signed)
Please use zyrtec nightly.  The generic version of the zyrtec is cetirizine.  Please use the flonase spray 2 sprays per nostril right before you go to bed.   Please use prednisone until it is gone.  Please use saline in your nose as often as you can to rinse out your nose and thin out any socked in mucous.  Please take sudafed three times daily.    You can take benadryl 50 mg nightly at bedtime for extra drying power in your ear.

## 2016-07-10 NOTE — Progress Notes (Signed)
HPI  Patient presents to the office for evaluation of sinus drainage and right ear pain which started roughly 2 weeks ago.   Patient reports night > day, dry, barky, worse with lying down.  They also endorse change in voice, postnasal drip and sore throat, right ear pain, swelling behind the right ear, sinus drainge,  sinus congestion.  .  They have tried antibiotics or keflex, .  They report that nothing has worked.  They denies other sick contacts.  Review of Systems  Constitutional: Positive for malaise/fatigue. Negative for chills and fever.  HENT: Positive for congestion, ear pain, hearing loss and sore throat.   Respiratory: Positive for cough. Negative for sputum production, shortness of breath and wheezing.   Cardiovascular: Negative for chest pain, palpitations and leg swelling.  Neurological: Positive for headaches.    PE:  Vitals:   07/10/16 1117  BP: 128/80  Pulse: 90  Resp: 16  Temp: 98 F (36.7 C)    General:  Alert and non-toxic, WDWN, NAD HEENT: NCAT, PERLA, EOM normal, no occular discharge or erythema.  Nasal mucosal edema with sinus tenderness to palpation.  Oropharynx clear with minimal oropharyngeal edema and erythema.  Mucous membranes moist and pink.  Right middle ear effusion Neck:  Cervical adenopathy Chest:  RRR no MRGs.  Lungs clear to auscultation A&P with no wheezes rhonchi or rales.   Abdomen: +BS x 4 quadrants, soft, non-tender, no guarding, rigidity, or rebound. Skin: warm and dry no rash Neuro: A&Ox4, CN II-XII grossly intact  Assessment and Plan:   1. Middle ear effusion, right -prednisone -tylenol or ibuprofen prn  2. Allergic rhinitis, unspecified allergic rhinitis type -zyrtec -flonase -nasal saline -sudafed prn

## 2016-08-09 ENCOUNTER — Other Ambulatory Visit: Payer: Self-pay | Admitting: Internal Medicine

## 2016-08-09 ENCOUNTER — Other Ambulatory Visit: Payer: Self-pay | Admitting: *Deleted

## 2016-08-09 ENCOUNTER — Encounter: Payer: Self-pay | Admitting: Internal Medicine

## 2016-08-09 MED ORDER — PHENTERMINE HCL 37.5 MG PO TABS: 37.5000 mg | ORAL_TABLET | Freq: Every day | ORAL | 2 refills | Status: DC

## 2016-09-29 ENCOUNTER — Encounter: Payer: Self-pay | Admitting: Internal Medicine

## 2016-09-30 ENCOUNTER — Other Ambulatory Visit: Payer: Self-pay | Admitting: Internal Medicine

## 2016-09-30 DIAGNOSIS — M109 Gout, unspecified: Secondary | ICD-10-CM

## 2016-09-30 MED ORDER — ALLOPURINOL 100 MG PO TABS: 100.0000 mg | ORAL_TABLET | Freq: Every day | ORAL | 1 refills | Status: DC

## 2016-10-13 DIAGNOSIS — Z1231 Encounter for screening mammogram for malignant neoplasm of breast: Secondary | ICD-10-CM | POA: Diagnosis not present

## 2016-10-28 ENCOUNTER — Encounter: Payer: Self-pay | Admitting: *Deleted

## 2016-11-02 DIAGNOSIS — M5416 Radiculopathy, lumbar region: Secondary | ICD-10-CM | POA: Diagnosis not present

## 2016-11-02 DIAGNOSIS — M5136 Other intervertebral disc degeneration, lumbar region: Secondary | ICD-10-CM | POA: Diagnosis not present

## 2016-12-06 DIAGNOSIS — M5416 Radiculopathy, lumbar region: Secondary | ICD-10-CM | POA: Diagnosis not present

## 2017-01-23 ENCOUNTER — Ambulatory Visit (INDEPENDENT_AMBULATORY_CARE_PROVIDER_SITE_OTHER): Payer: Medicare Other

## 2017-01-23 ENCOUNTER — Telehealth: Payer: Self-pay | Admitting: *Deleted

## 2017-01-23 ENCOUNTER — Encounter: Payer: Self-pay | Admitting: Podiatry

## 2017-01-23 ENCOUNTER — Ambulatory Visit (INDEPENDENT_AMBULATORY_CARE_PROVIDER_SITE_OTHER): Payer: Medicare Other | Admitting: Podiatry

## 2017-01-23 DIAGNOSIS — M79671 Pain in right foot: Secondary | ICD-10-CM | POA: Diagnosis not present

## 2017-01-23 DIAGNOSIS — M2041 Other hammer toe(s) (acquired), right foot: Secondary | ICD-10-CM

## 2017-01-23 DIAGNOSIS — G5603 Carpal tunnel syndrome, bilateral upper limbs: Secondary | ICD-10-CM

## 2017-01-23 DIAGNOSIS — M2011 Hallux valgus (acquired), right foot: Secondary | ICD-10-CM | POA: Diagnosis not present

## 2017-01-23 DIAGNOSIS — M21611 Bunion of right foot: Secondary | ICD-10-CM | POA: Diagnosis not present

## 2017-01-23 DIAGNOSIS — M7751 Other enthesopathy of right foot: Secondary | ICD-10-CM | POA: Diagnosis not present

## 2017-01-23 HISTORY — DX: Carpal tunnel syndrome, bilateral upper limbs: G56.03

## 2017-01-23 NOTE — Telephone Encounter (Signed)
"  I just saw Dr. Amalia Hailey.  I need to schedule my foot surgery.  The day I have in mind is Thursday, April 5."

## 2017-01-24 NOTE — Telephone Encounter (Signed)
"  I saw Dr. Amalia Hailey yesterday and he said I needed to call you to schedule my surgery.  I'm having a bunionectomy and a hammer toe surgery."  You want to do it on April 5th, that date is available.  I will get it scheduled.  "What time will it be?  Will it be first thing in the morning?"  I cannot give you a time.  Someone from the surgical center will call you with the arrival time.  The reason I can't give you a time is because they may have a patient that is Diabetic or a child that needs to go first.  They also have cancellations and have to switch people around.  "I understand, thank you so much."

## 2017-01-25 ENCOUNTER — Encounter: Payer: Self-pay | Admitting: Internal Medicine

## 2017-01-28 MED ORDER — BETAMETHASONE SOD PHOS & ACET 6 (3-3) MG/ML IJ SUSP: 3.0000 mg | Freq: Once | INTRAMUSCULAR | Status: DC

## 2017-01-28 NOTE — Progress Notes (Signed)
   Subjective:  66 year old female presents today as a new patient for pain and tenderness to her right foot this been going on for approximately 1 year now. Patient states that she has continual pain without any alleviation of symptoms. Patient has tried multiple conservative modalities including shoe gear modifications.    Objective/Physical Exam General: The patient is alert and oriented x3 in no acute distress.  Dermatology: Skin is warm, dry and supple bilateral lower extremities. Negative for open lesions or macerations.  Vascular: Palpable pedal pulses bilaterally. No edema or erythema noted. Capillary refill within normal limits.  Neurological: Epicritic and protective threshold grossly intact bilaterally.   Musculoskeletal Exam: Clinical evidence of hallux abductovalgus bunion deformity to the right foot. There is also hammertoe contracture deformity to the second digit right foot as well. Pain on palpation and range of motion also noted second MPJ right foot.  Radiographic Exam:  Increased intermetatarsal angle noted to the first interspace right foot greater than 15 with hallux abductovalgus deformity. Hallux abductus angle greater than 30.  Assessment: #1 hallux abductovalgus with bunion deformity right foot #2 hammertoe contracture second digit right foot   Plan of Care:  #1 Patient was evaluated. #2 injection of 0.5 mL Celestone Soluspan injected in the second MPJ right foot. #3 today we discussed the conservative versus surgical management of bunion deformity. Today the patient opts for surgical management. Conservative modalities have been unsuccessful in providing any sort of satisfactory alleviation of symptoms for the patient. All POSSIBLE complications and details of the procedure were explained. All patient questions were answered. No guarantees were expressed or implied. #4. Authorization for surgery initiated today. Surgery will consist of bunionectomy with  metatarsal osteotomy right foot. PIPJ arthroplasty second digit right foot. MPJ capsulotomy second digit right foot. #5 today cam boot was dispensed #6 return to clinic 1 week postop   Edrick Kins, DPM [facility]  Dr. Edrick Kins, [address]                                        [place], [place] [address]                Office [phone]  Fax [phone]

## 2017-02-08 ENCOUNTER — Ambulatory Visit (INDEPENDENT_AMBULATORY_CARE_PROVIDER_SITE_OTHER): Payer: Medicare Other | Admitting: Internal Medicine

## 2017-02-08 VITALS — BP 122/68 | HR 70 | Temp 97.8°F | Resp 16 | Ht 67.0 in | Wt 162.0 lb

## 2017-02-08 DIAGNOSIS — Z79899 Other long term (current) drug therapy: Secondary | ICD-10-CM

## 2017-02-08 DIAGNOSIS — E782 Mixed hyperlipidemia: Secondary | ICD-10-CM | POA: Diagnosis not present

## 2017-02-08 DIAGNOSIS — R7303 Prediabetes: Secondary | ICD-10-CM | POA: Diagnosis not present

## 2017-02-08 DIAGNOSIS — M109 Gout, unspecified: Secondary | ICD-10-CM | POA: Diagnosis not present

## 2017-02-08 DIAGNOSIS — Z136 Encounter for screening for cardiovascular disorders: Secondary | ICD-10-CM | POA: Diagnosis not present

## 2017-02-08 DIAGNOSIS — Z0001 Encounter for general adult medical examination with abnormal findings: Secondary | ICD-10-CM

## 2017-02-08 DIAGNOSIS — M21619 Bunion of unspecified foot: Secondary | ICD-10-CM

## 2017-02-08 DIAGNOSIS — E559 Vitamin D deficiency, unspecified: Secondary | ICD-10-CM

## 2017-02-08 DIAGNOSIS — Z23 Encounter for immunization: Secondary | ICD-10-CM | POA: Diagnosis not present

## 2017-02-08 DIAGNOSIS — I10 Essential (primary) hypertension: Secondary | ICD-10-CM | POA: Diagnosis not present

## 2017-02-08 LAB — BASIC METABOLIC PANEL WITH GFR
BUN: 17 mg/dL (ref 7–25)
CHLORIDE: 99 mmol/L (ref 98–110)
CO2: 31 mmol/L (ref 20–31)
Calcium: 9.9 mg/dL (ref 8.6–10.4)
Creat: 0.82 mg/dL (ref 0.50–0.99)
GFR, EST NON AFRICAN AMERICAN: 75 mL/min (ref >=60)
GFR, Est African American: 86 mL/min (ref >=60)
Glucose, Bld: 100 mg/dL — ABNORMAL HIGH (ref 65–99)
POTASSIUM: 3.7 mmol/L (ref 3.5–5.3)
SODIUM: 140 mmol/L (ref 135–146)

## 2017-02-08 LAB — LIPID PANEL
CHOL/HDL RATIO: 4.7 ratio (ref <5.0)
Cholesterol: 186 mg/dL (ref <200)
HDL: 40 mg/dL — AB (ref >50)
LDL CALC: 93 mg/dL (ref <100)
TRIGLYCERIDES: 265 mg/dL — AB (ref <150)
VLDL: 53 mg/dL — ABNORMAL HIGH (ref <30)

## 2017-02-08 LAB — CBC WITH DIFFERENTIAL/PLATELET
BASOS PCT: 1 %
Basophils Absolute: 62 cells/uL (ref 0–200)
EOS ABS: 124 {cells}/uL (ref 15–500)
EOS PCT: 2 %
HCT: 43.4 % (ref 35.0–45.0)
Hemoglobin: 15.3 g/dL (ref 11.7–15.5)
Lymphocytes Relative: 39 %
Lymphs Abs: 2418 cells/uL (ref 850–3900)
MCH: 31.2 pg (ref 27.0–33.0)
MCHC: 35.3 g/dL (ref 32.0–36.0)
MCV: 88.4 fL (ref 80.0–100.0)
MONOS PCT: 7 %
MPV: 9 fL (ref 7.5–12.5)
Monocytes Absolute: 434 cells/uL (ref 200–950)
NEUTROS ABS: 3162 {cells}/uL (ref 1500–7800)
Neutrophils Relative %: 51 %
PLATELETS: 186 10*3/uL (ref 140–400)
RBC: 4.91 MIL/uL (ref 3.80–5.10)
RDW: 13.8 % (ref 11.0–15.0)
WBC: 6.2 10*3/uL (ref 3.8–10.8)

## 2017-02-08 LAB — HEPATIC FUNCTION PANEL
ALK PHOS: 82 U/L (ref 33–130)
ALT: 18 U/L (ref 6–29)
AST: 19 U/L (ref 10–35)
Albumin: 4.7 g/dL (ref 3.6–5.1)
BILIRUBIN DIRECT: 0.2 mg/dL (ref <=0.2)
BILIRUBIN TOTAL: 1 mg/dL (ref 0.2–1.2)
Indirect Bilirubin: 0.8 mg/dL (ref 0.2–1.2)
Total Protein: 7.6 g/dL (ref 6.1–8.1)

## 2017-02-08 LAB — TSH: TSH: 1.28 mIU/L

## 2017-02-08 MED ORDER — PHENTERMINE HCL 37.5 MG PO TABS: 37.5000 mg | ORAL_TABLET | Freq: Every day | ORAL | 2 refills | Status: DC

## 2017-02-08 NOTE — Progress Notes (Signed)
Complete Physical  Assessment and Plan:   1. Encounter for general adult medical examination with abnormal findings -due next year  2. Hypertension, benign essential, goal below 140/90 -well controlled -cont current medication - Urinalysis, Routine w reflex microscopic - Microalbumin / creatinine urine ratio - EKG 12-Lead - TSH  3. Mixed hyperlipidemia -cont diet and exercise as tolerated -not currently on medications - Lipid panel  4. Prediabetes -cont diet and exercise -no need for medications - Hemoglobin A1c - Insulin, random  5. Gout of big toe -cont allopurinol - Uric acid  6. Medication management  - CBC with Differential/Platelet - BASIC METABOLIC PANEL WITH GFR - Hepatic function panel - Magnesium  7. Vitamin D deficiency -cont vitamin D and calcium supplement for joint health - VITAMIN D 25 Hydroxy (Vit-D Deficiency, Fractures)  8. Need for prophylactic vaccination against Streptococcus pneumoniae (pneumococcus)  - Pneumococcal polysaccharide vaccine 23-valent greater than or equal to 2yo subcutaneous/IM  9.  Bunion -patient having surgery by podiatrist in a couple weeks -no cardiovascular disease known -average risk factor -do feel that she is cleared for surgery from a medical stand point.  Discussed med's effects and SE's. Screening labs and tests as requested with regular follow-up as recommended.  HPI  66 y.o. female  presents for a complete physical.  Her blood pressure has been controlled at home, today their BP is BP: 122/68.  She does workout. She denies chest pain, shortness of breath, dizziness.   She is on cholesterol medication and denies myalgias. Her cholesterol is at goal. The cholesterol last visit was:  Lab Results  Component Value Date   CHOL 161 05/19/2016   HDL 36 (L) 05/19/2016   LDLCALC 68 05/19/2016   TRIG 285 (H) 05/19/2016   CHOLHDL 4.5 05/19/2016  .   She has been working on diet and exercise for prediabetes,  but this has been limited due to the fact that she is having very bad pain in her bunion which is going to be operated on in a couple weeks.  She reports that she has been limping a lot lately.   Lab Results  Component Value Date   HGBA1C 5.7 (H) 05/14/2014    Patient is on Vitamin D supplement.   Lab Results  Component Value Date   VD25OH 31 05/14/2014      Since she will be non-ambulatory for a period of 6 weeks after her bunionectomy she would like to take phentermine so that she dose not develop severe weight gain.    She does have a lot of anxiety about her upcoming surgery.  She is slightly closterphobic and feels that the post operative dressing will make her closterphobia worse.    Current Medications:  Current Outpatient Prescriptions on File Prior to Visit  Medication Sig Dispense Refill  . allopurinol (ZYLOPRIM) 100 MG tablet Take 1 tablet (100 mg total) by mouth daily. 90 tablet 1  . pravastatin (PRAVACHOL) 20 MG tablet Take 1 tablet (20 mg total) by mouth daily. 90 tablet 2  . triamterene-hydrochlorothiazide (MAXZIDE-25) 37.5-25 MG tablet Take by mouth.     No current facility-administered medications on file prior to visit.     Health Maintenance:   Immunization History  Administered Date(s) Administered  . PPD Test 05/14/2014    Tetanus: 2011 Pneumovax: 2018 Prevnar 13: 2017 LMP: Hysterectomy LOV:[id number]  Colonoscopy:2011 Last Dental Exam: 2018 Last Eye Exam: [facility], 2018, contacts  Patient Care Team: Unk Pinto, MD as PCP - General (Internal  Medicine) Avon Gully, NP as Nurse Practitioner (Obstetrics and Gynecology) Clarene Essex, MD as Consulting Physician (Gastroenterology) Magnus Sinning, MD (Inactive) as Consulting Physician (Orthopedic Surgery) Suella Broad, MD as Consulting Physician (Physical Medicine and Rehabilitation) Jari Pigg, MD as Consulting Physician (Dermatology)  Allergies: No Known Allergies  Medical History:  Past  Medical History:  Diagnosis Date  . Allergy   . Anxiety   . Bell palsy 02/04/2016   Left  . Cancer ([place]) 2011   BCC  . Hyperlipidemia   . Hypertension 2005  . Plantar fasciitis     Surgical History:  Past Surgical History:  Procedure Laterality Date  . ABDOMINAL HYSTERECTOMY  1990  . basal cell carcinoma  2011   nose  . TENDON REPAIR     plantar fasciitis  . TONSILLECTOMY      Family History:  Family History  Problem Relation Age of Onset  . Hypertension Mother   . Heart attack Mother   . Hyperlipidemia Mother   . Heart disease Mother 38    fatal MI  . Stroke Mother   . Arthritis Mother     rheumatoid  . Hypertension Brother   . Cancer Brother     throat, leukemia  . Parkinson's disease Brother   . Cancer Father     lung    Social History:  Social History  Substance Use Topics  . Smoking status: Never Smoker  . Smokeless tobacco: Never Used  . Alcohol use Not on file    Review of Systems: Review of Systems  Constitutional: Negative for chills, fever and malaise/fatigue.  HENT: Negative for congestion, ear pain and sore throat.   Eyes: Negative.   Respiratory: Negative for cough, shortness of breath and wheezing.   Cardiovascular: Negative for chest pain, palpitations and leg swelling.  Gastrointestinal: Negative for abdominal pain, blood in stool, constipation, diarrhea, heartburn and melena.  Genitourinary: Negative.   Musculoskeletal: Positive for joint pain and myalgias.  Skin: Negative.   Neurological: Negative for dizziness, sensory change, loss of consciousness and headaches.  Psychiatric/Behavioral: Negative for depression. The patient is not nervous/anxious and does not have insomnia.     Physical Exam: Estimated body mass index is 25.37 kg/m as calculated from the following:   Height as of this encounter: 5\' 7"  (1.702 m).   Weight as of this encounter: 162 lb (73.5 kg). BP 122/68   Pulse 70   Temp 97.8 F (36.6 C) (Temporal)   Resp 16    Ht 5\' 7"  (1.702 m)   Wt 162 lb (73.5 kg)   BMI 25.37 kg/m   General Appearance: Well nourished well developed, in no apparent distress.  Eyes: PERRLA, EOMs, conjunctiva no swelling or erythema ENT/Mouth: Ear canals normal without obstruction, swelling, erythema, or discharge.  TMs normal bilaterally with no erythema, bulging, retraction, or loss of landmark.  Oropharynx moist and clear with no exudate, erythema, or swelling.   Neck: Supple, thyroid normal. No bruits.  No cervical adenopathy Respiratory: Respiratory effort normal, Breath sounds clear A&P without wheeze, rhonchi, rales.   Cardio: RRR without murmurs, rubs or gallops. Brisk peripheral pulses without edema.  Chest: symmetric, with normal excursions Breasts: Symmetric, without lumps, nipple discharge, retractions.  Abdomen: Soft, nontender, no guarding, rebound, hernias, masses, or organomegaly.  Lymphatics: Non tender without lymphadenopathy.  Musculoskeletal: Full ROM all peripheral extremities,5/5 strength, and normal gait. Bunions bilaterally L>R.  TTP of the joint line with hammer toe on the L second toe.    Skin:  Warm, dry without rashes, lesions, ecchymosis. Neuro: Awake and oriented X 3, Cranial nerves intact, reflexes equal bilaterally. Normal muscle tone, no cerebellar symptoms. Sensation intact.  Psych:  normal affect, Insight and Judgment appropriate.   EKG: WNL no changes.  Over 40 minutes of exam, counseling, chart review and critical decision making was performed  Starlyn Skeans 9:38 AM [facility]

## 2017-02-09 LAB — URIC ACID: Uric Acid, Serum: 6.6 mg/dL (ref 2.5–7.0)

## 2017-02-09 LAB — HEMOGLOBIN A1C
HEMOGLOBIN A1C: 5.3 % (ref <5.7)
Mean Plasma Glucose: 105 mg/dL

## 2017-02-09 LAB — URINALYSIS, ROUTINE W REFLEX MICROSCOPIC
Bilirubin Urine: NEGATIVE
Glucose, UA: NEGATIVE
HGB URINE DIPSTICK: NEGATIVE
KETONES UR: NEGATIVE
LEUKOCYTES UA: NEGATIVE
NITRITE: NEGATIVE
Protein, ur: NEGATIVE
SPECIFIC GRAVITY, URINE: 1.023 (ref 1.001–1.035)
pH: 6 (ref 5.0–8.0)

## 2017-02-09 LAB — MICROALBUMIN / CREATININE URINE RATIO
CREATININE, URINE: 172 mg/dL (ref 20–320)
MICROALB UR: 0.6 mg/dL
Microalb Creat Ratio: 3 mcg/mg creat (ref <30)

## 2017-02-09 LAB — MAGNESIUM: MAGNESIUM: 2 mg/dL (ref 1.5–2.5)

## 2017-02-09 LAB — VITAMIN D 25 HYDROXY (VIT D DEFICIENCY, FRACTURES): Vit D, 25-Hydroxy: 33 ng/mL (ref 30–100)

## 2017-02-12 LAB — INSULIN, RANDOM: Insulin: 3.9 u[IU]/mL (ref 2.0–19.6)

## 2017-02-18 ENCOUNTER — Encounter: Payer: Self-pay | Admitting: Podiatry

## 2017-03-01 ENCOUNTER — Encounter: Payer: Self-pay | Admitting: Podiatry

## 2017-03-01 DIAGNOSIS — M21611 Bunion of right foot: Secondary | ICD-10-CM | POA: Diagnosis not present

## 2017-03-01 DIAGNOSIS — M2011 Hallux valgus (acquired), right foot: Secondary | ICD-10-CM | POA: Diagnosis not present

## 2017-03-01 DIAGNOSIS — E7800 Pure hypercholesterolemia, unspecified: Secondary | ICD-10-CM | POA: Diagnosis not present

## 2017-03-01 DIAGNOSIS — M25571 Pain in right ankle and joints of right foot: Secondary | ICD-10-CM | POA: Diagnosis not present

## 2017-03-01 DIAGNOSIS — M2041 Other hammer toe(s) (acquired), right foot: Secondary | ICD-10-CM | POA: Diagnosis not present

## 2017-03-01 DIAGNOSIS — M7751 Other enthesopathy of right foot: Secondary | ICD-10-CM | POA: Diagnosis not present

## 2017-03-02 ENCOUNTER — Encounter: Payer: Self-pay | Admitting: Podiatry

## 2017-03-02 ENCOUNTER — Ambulatory Visit (INDEPENDENT_AMBULATORY_CARE_PROVIDER_SITE_OTHER): Payer: Medicare Other | Admitting: Podiatry

## 2017-03-02 DIAGNOSIS — M2041 Other hammer toe(s) (acquired), right foot: Secondary | ICD-10-CM

## 2017-03-02 DIAGNOSIS — M7751 Other enthesopathy of right foot: Secondary | ICD-10-CM

## 2017-03-02 DIAGNOSIS — M2011 Hallux valgus (acquired), right foot: Secondary | ICD-10-CM

## 2017-03-02 DIAGNOSIS — M21611 Bunion of right foot: Secondary | ICD-10-CM

## 2017-03-02 NOTE — Progress Notes (Signed)
Subjective: Patient presents today status post one day for bunionectomy with hammertoe repair to the right foot. Patient states that the dressing was bleeding so she wanted it checked. Date of surgery 03/01/2017.  Objective: Today there is some bleeding noted to the dressings. However the incision site appears to be well coapted sutures and staples intact. There is no active bleeding at the moment.  Assessment: Status post bunionectomy with second digit hammertoe repair right foot. Date of surgery 03/01/2017.  Plan of care: Today dressings were changed. Antibiotic ointment applied around the external pin. Keep scheduled postoperative appointment for next week.  Edrick Kins, DPM [facility]  Dr. Edrick Kins, [address]                                        [place], [place] [address]                Office [phone]  Fax [phone]

## 2017-03-09 ENCOUNTER — Ambulatory Visit (INDEPENDENT_AMBULATORY_CARE_PROVIDER_SITE_OTHER): Payer: Medicare Other | Admitting: Podiatry

## 2017-03-09 ENCOUNTER — Encounter: Payer: Self-pay | Admitting: Podiatry

## 2017-03-09 DIAGNOSIS — Z9889 Other specified postprocedural states: Secondary | ICD-10-CM

## 2017-03-09 DIAGNOSIS — R600 Localized edema: Secondary | ICD-10-CM

## 2017-03-09 MED ORDER — HYDROCODONE-ACETAMINOPHEN 5-325 MG PO TABS: 1.0000 | ORAL_TABLET | Freq: Four times a day (QID) | ORAL | 0 refills | Status: DC | PRN

## 2017-03-11 NOTE — Progress Notes (Signed)
Subjective: Patient presents today status post bunionectomy POV #2 with hammertoe repair to the right foot. Date of surgery 03/01/2017. She states she is doing well but is having pain to the dorsum of the right foot. She is also requesting a shorter boot.  Objective: The incision site appears to be well coapted sutures and staples intact. There is no active bleeding at the moment. No sign of infectious process noted.  Assessment: Status post bunionectomy with second digit hammertoe repair right foot. Date of surgery 03/01/2017.  Plan of care: Prescription for hydrocodone 5/325. Short cam boot dispensed. Return to clinic in 2 weeks for suture and staple removal.   Edrick Kins, DPM [facility]  Dr. Edrick Kins, [address]                                        [place], [place] [address]                Office [phone]  Fax [phone]

## 2017-03-23 ENCOUNTER — Ambulatory Visit (INDEPENDENT_AMBULATORY_CARE_PROVIDER_SITE_OTHER): Payer: Medicare Other | Admitting: Podiatry

## 2017-03-23 ENCOUNTER — Ambulatory Visit (INDEPENDENT_AMBULATORY_CARE_PROVIDER_SITE_OTHER): Payer: Medicare Other

## 2017-03-23 ENCOUNTER — Encounter: Payer: Self-pay | Admitting: Podiatry

## 2017-03-23 DIAGNOSIS — M2011 Hallux valgus (acquired), right foot: Secondary | ICD-10-CM | POA: Diagnosis not present

## 2017-03-23 DIAGNOSIS — Z9889 Other specified postprocedural states: Secondary | ICD-10-CM

## 2017-03-23 DIAGNOSIS — M21611 Bunion of right foot: Secondary | ICD-10-CM

## 2017-03-24 NOTE — Progress Notes (Signed)
Subjective: Patient presents today status post bunionectomy with hammertoe repair to the right foot. POV #2 Date of surgery 03/01/2017. She states she is doing better and has no complaints today.  Objective: The incision site appears to be well coapted sutures and staples intact. There is no active bleeding at the moment. No sign of infectious process noted.  Radiographic Exam: Orthopedic hardware intact. Stable surgical site.  Assessment: Status post bunionectomy with second digit hammertoe repair right foot- imporving. Date of surgery 03/01/2017.  Plan of care: Patient was evaluated. X-Rays reviewed. Dressings changed. Post op shoe dispensed. Return to clinic in 1 week for pin removal.   Edrick Kins, DPM [facility]  Dr. Edrick Kins, [address]                                        [place], [place] [address]                Office [phone]  Fax [phone]

## 2017-03-30 ENCOUNTER — Ambulatory Visit (INDEPENDENT_AMBULATORY_CARE_PROVIDER_SITE_OTHER): Payer: Medicare Other | Admitting: Podiatry

## 2017-03-30 DIAGNOSIS — M21611 Bunion of right foot: Secondary | ICD-10-CM

## 2017-03-30 DIAGNOSIS — M2011 Hallux valgus (acquired), right foot: Secondary | ICD-10-CM

## 2017-03-30 DIAGNOSIS — Z9889 Other specified postprocedural states: Secondary | ICD-10-CM

## 2017-04-02 ENCOUNTER — Telehealth: Payer: Self-pay | Admitting: *Deleted

## 2017-04-02 DIAGNOSIS — M2041 Other hammer toe(s) (acquired), right foot: Secondary | ICD-10-CM

## 2017-04-02 DIAGNOSIS — M21619 Bunion of unspecified foot: Secondary | ICD-10-CM

## 2017-04-02 DIAGNOSIS — Z9889 Other specified postprocedural states: Secondary | ICD-10-CM

## 2017-04-02 NOTE — Telephone Encounter (Addendum)
-----   Message from Edrick Kins, DPM sent at 03/30/2017  1:46 PM EDT ----- Regarding: Physical therapy Physical therapy 3x/week x 4 weeks.  Dx: s/p bunionectomy with hammertoe repair 2nd digit right foot.   Thanks, Dr. Amalia Hailey. 04/02/2017-Faxed orders to [facility]. 04/03/2017-Faxed orders to [facility] after pt stated they had not received. I faxed with the confirmation of yesterday's fax. I called [facility] before I got her 4:25pm message and informed I was faxing to the number on their form. [facility] looked at their form and said it was wrong and apologized and would get it corrected. I faxed to the [facility] [phone].

## 2017-04-02 NOTE — Progress Notes (Signed)
Subjective: Patient presents today status post bunionectomy with hammertoe repair to the right foot. POV #3 Date of surgery 03/01/2017. She states she is doing great and has no complaints today. She is her for removal of the pins.  Objective: The incision site appears to be well coapted sutures and staples intact. There is no active bleeding at the moment. No sign of infectious process noted.  Assessment: Status post bunionectomy with second digit hammertoe repair right foot- improving. Date of surgery 03/01/2017.  Plan of care: Patient was evaluated. Percutaneous fixation pin removed today. Compression anklet dispensed. Physical therapy ordered today. Return to clinic in 4 weeks.   Edrick Kins, DPM [facility]  Dr. Edrick Kins, [address]                                        [place], [place] [address]                Office [phone]  Fax [phone]

## 2017-04-03 ENCOUNTER — Encounter: Payer: Self-pay | Admitting: Podiatry

## 2017-04-04 DIAGNOSIS — M25571 Pain in right ankle and joints of right foot: Secondary | ICD-10-CM | POA: Diagnosis not present

## 2017-04-06 DIAGNOSIS — M25571 Pain in right ankle and joints of right foot: Secondary | ICD-10-CM | POA: Diagnosis not present

## 2017-04-09 DIAGNOSIS — M25571 Pain in right ankle and joints of right foot: Secondary | ICD-10-CM | POA: Diagnosis not present

## 2017-04-11 ENCOUNTER — Encounter: Payer: Self-pay | Admitting: *Deleted

## 2017-04-12 DIAGNOSIS — M25571 Pain in right ankle and joints of right foot: Secondary | ICD-10-CM | POA: Diagnosis not present

## 2017-04-13 DIAGNOSIS — M25571 Pain in right ankle and joints of right foot: Secondary | ICD-10-CM | POA: Diagnosis not present

## 2017-04-16 DIAGNOSIS — M25571 Pain in right ankle and joints of right foot: Secondary | ICD-10-CM | POA: Diagnosis not present

## 2017-04-18 DIAGNOSIS — M25571 Pain in right ankle and joints of right foot: Secondary | ICD-10-CM | POA: Diagnosis not present

## 2017-04-19 DIAGNOSIS — M25571 Pain in right ankle and joints of right foot: Secondary | ICD-10-CM | POA: Diagnosis not present

## 2017-04-23 DIAGNOSIS — M25571 Pain in right ankle and joints of right foot: Secondary | ICD-10-CM | POA: Diagnosis not present

## 2017-04-25 DIAGNOSIS — M25571 Pain in right ankle and joints of right foot: Secondary | ICD-10-CM | POA: Diagnosis not present

## 2017-04-26 DIAGNOSIS — M25571 Pain in right ankle and joints of right foot: Secondary | ICD-10-CM | POA: Diagnosis not present

## 2017-04-27 ENCOUNTER — Ambulatory Visit (INDEPENDENT_AMBULATORY_CARE_PROVIDER_SITE_OTHER): Payer: Medicare Other

## 2017-04-27 ENCOUNTER — Ambulatory Visit (INDEPENDENT_AMBULATORY_CARE_PROVIDER_SITE_OTHER): Payer: Medicare Other | Admitting: Podiatry

## 2017-04-27 DIAGNOSIS — M2011 Hallux valgus (acquired), right foot: Secondary | ICD-10-CM | POA: Diagnosis not present

## 2017-04-27 DIAGNOSIS — M2041 Other hammer toe(s) (acquired), right foot: Secondary | ICD-10-CM

## 2017-04-27 DIAGNOSIS — M21611 Bunion of right foot: Secondary | ICD-10-CM

## 2017-04-29 NOTE — Progress Notes (Signed)
Subjective: Patient presents today status post bunionectomy with hammertoe repair to the right foot. POV #4 Date of surgery 03/01/2017. Patient states she is doing much better and finished physical therapy yesterday. Icing the area helps alleviate the pain. She denies any further complaints at this time.  Objective: The incision site appears to be well coapted. There is no active bleeding at the moment. No sign of infectious process noted.  Radiographic Exam:  Orthopedic hardware and osteotomies sites appear to be stable with routine healing.  Assessment: Status post bunionectomy with second digit hammertoe repair right foot- improving. Date of surgery 03/01/2017. Doing well.  Plan of care: Patient was evaluated. X-rays reviewed. Slowly increase activity. Return to clinic when necessary.   Edrick Kins, DPM [facility]  Dr. Edrick Kins, [address]                                        [place], [place] [address]                Office [phone]  Fax [phone]

## 2017-05-01 ENCOUNTER — Encounter: Payer: Medicare Other | Admitting: Podiatry

## 2017-05-03 NOTE — Progress Notes (Signed)
Bunionectomy right foot. Hammertoe repair right foot second digit

## 2017-05-18 ENCOUNTER — Ambulatory Visit (INDEPENDENT_AMBULATORY_CARE_PROVIDER_SITE_OTHER): Payer: Medicare Other | Admitting: Podiatry

## 2017-05-18 DIAGNOSIS — M71071 Abscess of bursa, right ankle and foot: Secondary | ICD-10-CM

## 2017-05-18 DIAGNOSIS — Z9889 Other specified postprocedural states: Secondary | ICD-10-CM | POA: Diagnosis not present

## 2017-05-18 NOTE — Progress Notes (Signed)
   HPI: 66 year old female presents today for follow-up evaluation and treatment of a symptomatic second MPJ right foot. Patient recently underwent bunionectomy and hammertoe repair second digit of the right foot. Date of surgery 03/01/2017. Patient states she still continues to have sub-second MPJ pain. She states that it feels like there is a lump on the bottom of her foot. Patient experiences significant pain with ambulation. Patient presents today for further treatment and evaluation   Physical Exam: General: The patient is alert and oriented x3 in no acute distress.  Dermatology: Skin is warm, dry and supple bilateral lower extremities. Negative for open lesions or macerations.  Vascular: Palpable pedal pulses bilaterally. No edema or erythema noted. Capillary refill within normal limits.  Neurological: Epicritic and protective threshold grossly intact bilaterally.   Musculoskeletal Exam: There is a palpable soft tissue mass sub-second MPJ. Rule out possible adventitious bursa Status post bunionectomy and second digit hammertoe repair right foot.  Assessment: 1. Soft tissue mass sub-second MPJ right foot 2. Second MPJ bursitis right foot 3. Possible adventitious bursa sub-second MPJ right foot 4. Status post bunionectomy and hammertoe repair right foot. Date of surgery 03/01/2017   Plan of Care:  1. Patient was evaluated. 2. Today were going to order an MRI to rule out an adventitious bursa sub-second MPJ right foot. If MRI findings are consistent with a soft tissue mass we will undergo surgical excision. 3. Return to clinic 3 weeks to review MRI   Edrick Kins, DPM [facility]  Dr. Edrick Kins, DPM    [address] [place], [place] [address]                Office [phone]  Fax [phone]

## 2017-05-20 DIAGNOSIS — B029 Zoster without complications: Secondary | ICD-10-CM | POA: Diagnosis not present

## 2017-05-21 ENCOUNTER — Telehealth: Payer: Self-pay | Admitting: Podiatry

## 2017-05-21 ENCOUNTER — Telehealth: Payer: Self-pay | Admitting: *Deleted

## 2017-05-21 DIAGNOSIS — M7989 Other specified soft tissue disorders: Secondary | ICD-10-CM

## 2017-05-21 NOTE — Telephone Encounter (Signed)
I saw Dr. Amalia Hailey on Friday and he ordered me to have an MRI because I'm going to need to have another surgery. I'm calling to see if we can get things moving and coordinated because I'm supposed to come back and see him again in a few weeks. I live in [place] and would like to have the MRI done here but I'm also willing to come to [place]. If you could, please call me back at [phone]. Thank you.

## 2017-05-21 NOTE — Telephone Encounter (Addendum)
-----   Message from Edrick Kins, DPM sent at 05/18/2017  7:15 PM EDT ----- Regarding: MRI right forefoot Please order MRI w/ or w/out contrast right forefoot.  Dx: Soft tissue mass with swelling sub-second MPJ right foot.   Thanks, Dr. Amalia Hailey.05/21/2017-I asked pt if she wanted to MRI in [place] or [place], pt stated [place].Faxed orders to [facility].

## 2017-05-29 ENCOUNTER — Ambulatory Visit
Admission: RE | Admit: 2017-05-29 | Discharge: 2017-05-29 | Disposition: A | Discharge status: Home / Self Care | Payer: Medicare Other | Source: Ambulatory Visit | Attending: Podiatry | Admitting: Podiatry

## 2017-05-29 ENCOUNTER — Encounter: Payer: Self-pay | Admitting: Podiatry

## 2017-05-29 DIAGNOSIS — M799 Soft tissue disorder, unspecified: Secondary | ICD-10-CM | POA: Insufficient documentation

## 2017-05-29 DIAGNOSIS — M19071 Primary osteoarthritis, right ankle and foot: Secondary | ICD-10-CM | POA: Insufficient documentation

## 2017-05-29 LAB — POCT I-STAT CREATININE: CREATININE: 0.9 mg/dL (ref 0.44–1.00)

## 2017-05-29 MED ORDER — GADOBENATE DIMEGLUMINE 529 MG/ML IV SOLN
15.0000 mL | Freq: Once | INTRAVENOUS | Status: AC | PRN
  Administered 2017-05-29: 15 mL via INTRAVENOUS

## 2017-06-01 ENCOUNTER — Encounter: Payer: Self-pay | Admitting: Podiatry

## 2017-06-01 ENCOUNTER — Telehealth: Payer: Self-pay | Admitting: *Deleted

## 2017-06-01 ENCOUNTER — Ambulatory Visit (INDEPENDENT_AMBULATORY_CARE_PROVIDER_SITE_OTHER): Payer: Medicare Other | Admitting: Podiatry

## 2017-06-01 DIAGNOSIS — M7989 Other specified soft tissue disorders: Secondary | ICD-10-CM

## 2017-06-01 DIAGNOSIS — M799 Soft tissue disorder, unspecified: Secondary | ICD-10-CM

## 2017-06-01 NOTE — Telephone Encounter (Signed)
"  I am calling back.  I spoke to you earlier today about scheduling surgery for August 2nd.  I'd like to do it this coming Thursday, July 12 if it's possible.  I need to know so my husband can take the time off.  Please let me know as soon as you get this message.

## 2017-06-01 NOTE — Progress Notes (Signed)
   HPI: Patient presents today for follow-up treatment and evaluation of a symptomatic second MPJ right foot. Patient underwent bunionectomy and hammertoe repair second digit right foot on 03/01/2017. Patient has no complaints with the bunion and hammertoe surgery. She states that there continues to be a lump on the bottom of her foot at the level of the second MPJ. Last visit MRI was ordered and patient presents today for follow-up evaluation and to go over the MRI results   Physical Exam: General: The patient is alert and oriented x3 in no acute distress.  Dermatology: Skin is warm, dry and supple bilateral lower extremities. Negative for open lesions or macerations.  Vascular: Palpable pedal pulses bilaterally. No edema or erythema noted. Capillary refill within normal limits.  Neurological: Epicritic and protective threshold grossly intact bilaterally.   Musculoskeletal Exam: Continued pain on palpation with some swelling noted to the plantar aspect of the second MPJ right foot. Range of motion within normal limits to all pedal and ankle joints bilateral. Muscle strength 5/5 in all groups bilateral.   MRI Impression:  1. Ill-defined soft tissue enhancement along the plantar aspect of the forefoot at the level of the second MTP and base of second proximal phalanx measuring 13 x 8 x 6 mm. Findings suggest the presence of a Morton's neuroma. 2. Osteoarthritis of the second MTP articulation with subchondral cystic change across the joint. 3. No acute fracture nor frank bone destruction. 4. Postop appearance of the first metatarsal shaft, presumably on the basis of prior reported bunion surgery.  Assessment: 1. Soft tissue mass plantar second MPJ right foot 2. Status post bunionectomy and hammertoe repair right foot. Date of surgery 03/01/2017.    Plan of Care:  1. Patient was evaluated. 2. Today we reviewed the MRI results of the right foot. Decision was made that we need to go in  surgically and remove the soft tissue mass which was identified on MRI. All possible complications and details of surgery were explained. No guarantees were expressed or implied. All patient questions were answered. 3. Authorization for surgery initiated today. Surgery will consist of excision of soft tissue mass right foot sub-second MTPJ using a plantar incision 4. Return to clinic 1 week postop   Edrick Kins, DPM [facility]  Dr. Edrick Kins, [address]                                        [place], [place] [address]                Office [phone]  Fax [phone]

## 2017-06-01 NOTE — Patient Instructions (Signed)
Pre-Operative Instructions  Congratulations, you have decided to take an important step to improving your quality of life.  You can be assured that the doctors of [facility] will be with you every step of the way.  1. Plan to be at the surgery center/hospital at least 1 (one) hour prior to your scheduled time unless otherwise directed by the surgical center/hospital staff.  You must have a responsible adult accompany you, remain during the surgery and drive you home.  Make sure you have directions to the surgical center/hospital and know how to get there on time. 2. For hospital based surgery you will need to obtain a history and physical form from your family physician within 1 month prior to the date of surgery- we will give you a form for you primary physician.  3. We make every effort to accommodate the date you request for surgery.  There are however, times where surgery dates or times have to be moved.  We will contact you as soon as possible if a change in schedule is required.   4. No Aspirin/Ibuprofen for one week before surgery.  If you are on aspirin, any non-steroidal anti-inflammatory medications (Mobic, Aleve, Ibuprofen) you should stop taking it 7 days prior to your surgery.  You make take Tylenol  For pain prior to surgery.  5. Medications- If you are taking daily heart and blood pressure medications, seizure, reflux, allergy, asthma, anxiety, pain or diabetes medications, make sure the surgery center/hospital is aware before the day of surgery so they may notify you which medications to take or avoid the day of surgery. 6. No food or drink after midnight the night before surgery unless directed otherwise by surgical center/hospital staff. 7. No alcoholic beverages 24 hours prior to surgery.  No smoking 24 hours prior to or 24 hours after surgery. 8. Wear loose pants or shorts- loose enough to fit over bandages, boots, and casts. 9. No slip on shoes, sneakers are best. 10. Bring  your boot with you to the surgery center/hospital.  Also bring crutches or a walker if your physician has prescribed it for you.  If you do not have this equipment, it will be provided for you after surgery. 11. If you have not been contracted by the surgery center/hospital by the day before your surgery, call to confirm the date and time of your surgery. 12. Leave-time from work may vary depending on the type of surgery you have.  Appropriate arrangements should be made prior to surgery with your employer. 13. Prescriptions will be provided immediately following surgery by your doctor.  Have these filled as soon as possible after surgery and take the medication as directed. 14. Remove nail polish on the operative foot. 15. Wash the night before surgery.  The night before surgery wash the foot and leg well with the antibacterial soap provided and water paying special attention to beneath the toenails and in between the toes.  Rinse thoroughly with water and dry well with a towel.  Perform this wash unless told not to do so by your physician.  Enclosed: 1 Ice pack (please put in freezer the night before surgery)   1 Hibiclens skin cleaner   Pre-op Instructions  If you have any questions regarding the instructions, do not hesitate to call our office.  [place]: [address] [place], [place] [address] [phone]  [place]: [address] [place], [place] [address] [phone]  [place]: [address]  [place], [place] [address] [phone]   Dr.   Norman Regal DPM, Dr. Matthew Wagoner DPM, Dr. M. Todd Hyatt DPM, Dr. Titorya Stover DPM 

## 2017-06-01 NOTE — Telephone Encounter (Signed)
"  I just left your [place] office.  I need to schedule my surgery."  Do you have a date in mind?  "Yes, I'd like to do it August 2nd."  Okay, I will get it scheduled.

## 2017-06-04 NOTE — Telephone Encounter (Signed)
"  I'm sorry to be a bother but I was calling to see if Dr. Amalia Hailey has anything open for this Thursday.  I am trying to find out so I can let my husband know."  Dr. Amalia Hailey does not have anything available time this week.  "[name], just leave it for August 2."

## 2017-06-08 ENCOUNTER — Encounter: Payer: Medicare Other | Admitting: Podiatry

## 2017-06-19 ENCOUNTER — Other Ambulatory Visit: Payer: Self-pay | Admitting: Internal Medicine

## 2017-06-19 ENCOUNTER — Other Ambulatory Visit: Payer: Self-pay | Admitting: *Deleted

## 2017-06-19 ENCOUNTER — Encounter: Payer: Self-pay | Admitting: Internal Medicine

## 2017-06-19 DIAGNOSIS — M109 Gout, unspecified: Secondary | ICD-10-CM

## 2017-06-19 MED ORDER — PRAVASTATIN SODIUM 20 MG PO TABS: ORAL_TABLET | ORAL | 1 refills | Status: DC

## 2017-06-19 MED ORDER — TRIAMTERENE-HCTZ 37.5-25 MG PO TABS: ORAL_TABLET | ORAL | 1 refills | Status: DC

## 2017-06-19 MED ORDER — ALLOPURINOL 100 MG PO TABS: ORAL_TABLET | ORAL | 1 refills | Status: DC

## 2017-06-28 ENCOUNTER — Encounter: Payer: Self-pay | Admitting: Podiatry

## 2017-06-28 DIAGNOSIS — M799 Soft tissue disorder, unspecified: Secondary | ICD-10-CM | POA: Diagnosis not present

## 2017-06-28 DIAGNOSIS — R2241 Localized swelling, mass and lump, right lower limb: Secondary | ICD-10-CM | POA: Diagnosis not present

## 2017-06-28 DIAGNOSIS — D2121 Benign neoplasm of connective and other soft tissue of right lower limb, including hip: Secondary | ICD-10-CM | POA: Diagnosis not present

## 2017-06-28 DIAGNOSIS — I10 Essential (primary) hypertension: Secondary | ICD-10-CM | POA: Diagnosis not present

## 2017-06-28 DIAGNOSIS — M67471 Ganglion, right ankle and foot: Secondary | ICD-10-CM | POA: Diagnosis not present

## 2017-07-03 ENCOUNTER — Ambulatory Visit (INDEPENDENT_AMBULATORY_CARE_PROVIDER_SITE_OTHER): Payer: Medicare Other | Admitting: Podiatry

## 2017-07-03 DIAGNOSIS — Z9889 Other specified postprocedural states: Secondary | ICD-10-CM

## 2017-07-04 ENCOUNTER — Encounter: Payer: Self-pay | Admitting: Podiatry

## 2017-07-06 ENCOUNTER — Telehealth: Payer: Self-pay | Admitting: *Deleted

## 2017-07-06 NOTE — Telephone Encounter (Signed)
Pt states she is anxious concerning the biopsy from her surgery. I informed pt that the biopsy results had not arrived, but I would leave a message for Dr. Amalia Hailey and Nelma Rothman, RN to check for results on Monday.

## 2017-07-07 NOTE — Progress Notes (Signed)
   Subjective:  Patient presents today status post excision of soft tissue mass right foot. DOS: 06/28/2017. Patient states that she's doing very well. The surgical extremity is tender to touch however she denies fever or chills and pain is no longer managed by opioid medication    Objective/Physical Exam Skin incisions appear to be well coapted with sutures  intact. No sign of infectious process noted. No dehiscence. No active bleeding noted. Moderate edema noted to the surgical extremity.  Assessment: 1. s/p excision soft tissue mass right foot. DOS: 06/28/2017   Plan of Care:  1. Patient was evaluated.  2. Today dressings were changed. 3. Keep dressings clean dry and intact for an additional week. Return to clinic in 1 week for dressing change. Sutures will be removed at week 3    Edrick Kins, DPM [facility]  Dr. Edrick Kins, [place]                                        [place], [place] [address]                Office [phone]  Fax [phone]

## 2017-07-09 ENCOUNTER — Encounter: Payer: Self-pay | Admitting: Podiatry

## 2017-07-09 NOTE — Telephone Encounter (Signed)
Spoke with patient regarding biopsy results: Soft tissue biopsy mass Rt foot: Features consistent with neuroma, no malignancy identified

## 2017-07-10 ENCOUNTER — Ambulatory Visit (INDEPENDENT_AMBULATORY_CARE_PROVIDER_SITE_OTHER): Payer: Medicare Other | Admitting: Podiatry

## 2017-07-10 DIAGNOSIS — Z9889 Other specified postprocedural states: Secondary | ICD-10-CM

## 2017-07-16 NOTE — Progress Notes (Signed)
   Subjective:  Patient presents today status post excision of soft tissue mass right foot. DOS: 06/28/2017. Patient states that she's doing very well. Patient states that overall she is feeling better. She feels that perhaps he states did bust on the incision site however she has no new complaints at this time.   Objective/Physical Exam Skin incisions appear to be well coapted with sutures  intact. No sign of infectious process noted. No dehiscence. No active bleeding noted. Moderate edema noted to the surgical extremity. Swelling and ecchymosis appears to be significantly improved since last visit.  Assessment: 1. s/p excision soft tissue mass right foot. DOS: 06/28/2017   Plan of Care:  1. Patient was evaluated. Pathology reviewed today 2. Today dressings were changed.  3. The patient can begin showering at home. Instructions were provided for home care dressing changes. Dressing supplies were provided. 4. Return to clinic in 2 weeks    Edrick Kins, DPM [facility]  Dr. Edrick Kins, [place]                                        [place], [place] [address]                Office [phone]  Fax [phone]

## 2017-07-24 ENCOUNTER — Encounter: Payer: Self-pay | Admitting: Podiatry

## 2017-07-24 ENCOUNTER — Ambulatory Visit (INDEPENDENT_AMBULATORY_CARE_PROVIDER_SITE_OTHER): Payer: Medicare Other | Admitting: Podiatry

## 2017-07-24 DIAGNOSIS — Z9889 Other specified postprocedural states: Secondary | ICD-10-CM

## 2017-07-30 NOTE — Progress Notes (Signed)
   Subjective:  Patient presents today status post excision of soft tissue mass right foot. DOS: 06/28/2017. Patient states that she's doing very well. Patient has no new complaints at this time. She denies any pain.  Objective/Physical Exam Skin incisions appear to be well coapted with sutures  intact. No sign of infectious process noted. No dehiscence. No active bleeding noted. Moderate edema noted to the surgical extremity. Swelling and ecchymosis appears to be significantly improved since last visit.  Assessment: 1. s/p excision soft tissue mass right foot. DOS: 06/28/2017   Plan of Care:  1. Patient was evaluated.  2. Today sutures were removed 3. Patient can begin showering 4. Slowly return to good supportive shoes 5. Return to clinic when necessary   Edrick Kins, DPM [facility]  Dr. Edrick Kins, [place]                                        [place], [place] [address]                Office [phone]  Fax [phone]

## 2017-07-31 ENCOUNTER — Telehealth: Payer: Self-pay | Admitting: Internal Medicine

## 2017-07-31 NOTE — Telephone Encounter (Signed)
Pt called and wanted to know if Dr. Derrel Nip would take her on as a new patient. Pt states that her next door neighbor is Camillia Herter, and she spoke very highly of Dr. Derrel Nip.  Call pt @ [phone]

## 2017-07-31 NOTE — Telephone Encounter (Signed)
Non urgent. Can wait till your return.

## 2017-08-01 NOTE — Telephone Encounter (Signed)
No, I am sorry but I cannot take on any new patients at this time

## 2017-08-01 NOTE — Telephone Encounter (Signed)
You can offer the new PCP That is coming have patient's neighbor call in October to schedule with her, she is internal medicine.

## 2017-08-03 NOTE — Telephone Encounter (Signed)
Spoke with pt and informed her that Dr. Derrel Nip was sorry but that she was not taking any new pts right now. Offered the pt to call back in October to schedule an appt with our new provider that will be starting in December. Pt gave a verbal understanding.

## 2017-08-09 DIAGNOSIS — M545 Low back pain: Secondary | ICD-10-CM | POA: Diagnosis not present

## 2017-08-14 DIAGNOSIS — J069 Acute upper respiratory infection, unspecified: Secondary | ICD-10-CM | POA: Diagnosis not present

## 2017-08-14 DIAGNOSIS — J019 Acute sinusitis, unspecified: Secondary | ICD-10-CM | POA: Diagnosis not present

## 2017-08-21 ENCOUNTER — Ambulatory Visit (INDEPENDENT_AMBULATORY_CARE_PROVIDER_SITE_OTHER): Payer: Medicare Other | Admitting: Podiatry

## 2017-08-21 DIAGNOSIS — M7751 Other enthesopathy of right foot: Secondary | ICD-10-CM | POA: Diagnosis not present

## 2017-08-21 NOTE — Progress Notes (Signed)
   Subjective:  Patient presents today status post excision of soft tissue mass right foot. DOS: 06/28/2017. She states she is experiencing a great deal of pain at the incision site. She rates the pain at 4-5/10 and only when walking. She states it feels as if she is walking on something. She is here for further evaluation and treatment.    Past Medical History:  Diagnosis Date  . Allergy   . Anxiety   . Bell palsy 02/04/2016   Left  . Cancer ([place]) 2011   BCC  . Hyperlipidemia   . Hypertension 2005  . Plantar fasciitis      Objective/Physical Exam Skin incisions appear to be well coapted. No sign of infectious process noted. No dehiscence. No active bleeding noted. Moderate edema noted to the surgical extremity. Swelling and ecchymosis appears to be significantly improved since last visit.  Assessment: 1. s/p excision soft tissue mass right foot. DOS: 06/28/2017 2. 3rd and 4th MPJ capsulitis right   Plan of Care:  1. Patient was evaluated.  2. Injection of 0.5 mLs Celestone Soluspan injected into the 3rd and 4th MPJs of the right foot. 3. Appt with Liliane Channel for custom molded orthotics. 4. Return to clinic in 8 weeks.   Edrick Kins, DPM [facility]  Dr. Edrick Kins, [place]                                        [place], [place] [address]                Office [phone]  Fax [phone]

## 2017-08-22 ENCOUNTER — Ambulatory Visit: Payer: Medicare Other | Admitting: Orthotics

## 2017-08-22 DIAGNOSIS — Z9889 Other specified postprocedural states: Secondary | ICD-10-CM

## 2017-08-22 DIAGNOSIS — M7989 Other specified soft tissue disorders: Secondary | ICD-10-CM

## 2017-08-22 MED ORDER — BETAMETHASONE SOD PHOS & ACET 6 (3-3) MG/ML IJ SUSP: 3.0000 mg | Freq: Once | INTRAMUSCULAR | Status: DC

## 2017-08-22 NOTE — Progress Notes (Signed)
[name] decided to try powersteps before she gets custom since she is self pay.  Paid $55

## 2017-08-24 ENCOUNTER — Ambulatory Visit: Payer: Self-pay | Admitting: Internal Medicine

## 2017-09-03 NOTE — Progress Notes (Signed)
DOS 06/28/17 Excision of soft tissue mass Rt foot

## 2017-09-12 ENCOUNTER — Other Ambulatory Visit: Payer: Medicare Other | Admitting: Orthotics

## 2017-09-18 ENCOUNTER — Ambulatory Visit (INDEPENDENT_AMBULATORY_CARE_PROVIDER_SITE_OTHER): Payer: Medicare Other | Admitting: Podiatry

## 2017-09-18 ENCOUNTER — Ambulatory Visit (INDEPENDENT_AMBULATORY_CARE_PROVIDER_SITE_OTHER): Payer: Medicare Other

## 2017-09-18 DIAGNOSIS — M7751 Other enthesopathy of right foot: Secondary | ICD-10-CM | POA: Diagnosis not present

## 2017-09-18 MED ORDER — BETAMETHASONE SOD PHOS & ACET 6 (3-3) MG/ML IJ SUSP: 3.0000 mg | Freq: Once | INTRAMUSCULAR | Status: DC

## 2017-09-18 MED ORDER — DICLOFENAC SODIUM 75 MG PO TBEC: 75.0000 mg | DELAYED_RELEASE_TABLET | Freq: Two times a day (BID) | ORAL | 1 refills | Status: DC

## 2017-09-20 ENCOUNTER — Ambulatory Visit (INDEPENDENT_AMBULATORY_CARE_PROVIDER_SITE_OTHER): Payer: Medicare Other | Admitting: Internal Medicine

## 2017-09-20 VITALS — BP 140/82 | HR 76 | Temp 97.3°F | Resp 18 | Ht 67.0 in | Wt 175.8 lb

## 2017-09-20 DIAGNOSIS — I10 Essential (primary) hypertension: Secondary | ICD-10-CM

## 2017-09-20 DIAGNOSIS — R7309 Other abnormal glucose: Secondary | ICD-10-CM

## 2017-09-20 DIAGNOSIS — E782 Mixed hyperlipidemia: Secondary | ICD-10-CM

## 2017-09-20 DIAGNOSIS — Z79899 Other long term (current) drug therapy: Secondary | ICD-10-CM

## 2017-09-20 DIAGNOSIS — E559 Vitamin D deficiency, unspecified: Secondary | ICD-10-CM

## 2017-09-20 DIAGNOSIS — K760 Fatty (change of) liver, not elsewhere classified: Secondary | ICD-10-CM

## 2017-09-20 DIAGNOSIS — M109 Gout, unspecified: Secondary | ICD-10-CM | POA: Diagnosis not present

## 2017-09-20 NOTE — Progress Notes (Signed)
This very nice 66 y.o. MWF presents for 3 month follow up with Hypertension, Hyperlipidemia, Pre-Diabetes and Vitamin D Deficiency. In 2005 Abd U/S showed Fatty Liver. Colonoscopy in 2006 & 2011 had an adenomatous polyp per Dr Watt Climes.      Patient is treated for HTN circa 1998 & BP has been controlled at home. Today's BP is at goal - 140/82. Patient has had no complaints of any cardiac type chest pain, palpitations, dyspnea / orthopnea / PND, dizziness, claudication, or dependent edema.     Hyperlipidemia predates back to about 2005  and is controlled with diet & meds. Patient denies myalgias or other med SE's. Last Lipids were at goal albeit elevated Trig's: Lab Results  Component Value Date   CHOL 186 02/08/2017   HDL 40 (L) 02/08/2017   LDLCALC 93 02/08/2017   TRIG 265 (H) 02/08/2017   CHOLHDL 4.7 02/08/2017      Also, the patient has history of  PreDiabetes (A1c 5.9% /2012 and 5.7% /2015)  and she has had no symptoms of reactive hypoglycemia, diabetic polys, paresthesias or visual blurring.  Last A1c was normal & at goal: Lab Results  Component Value Date   HGBA1C 5.3 02/08/2017      Further, the patient also has history of Vitamin D Deficiency and supplements vitamin D without any suspected side-effects. Last vitamin D was very low:   Lab Results  Component Value Date   VD25OH 33 02/08/2017   Current Outpatient Prescriptions on File Prior to Visit  Medication Sig  . allopurinol (ZYLOPRIM) 100 MG tablet Take 1 tablet daily to prevent Gout  . Biotin 10 MG TABS Take by mouth daily.  . calcium carbonate (OSCAL) 1500 (600 Ca) MG TABS tablet Take by mouth daily with breakfast.  . Cyanocobalamin (VITAMIN B 12 PO) Take 1,000 mcg by mouth daily.  . diclofenac (VOLTAREN) 75 MG EC tablet Take 1 tablet (75 mg total) by mouth 2 (two) times daily.  . pravastatin (PRAVACHOL) 20 MG tablet Take 1/2 to 1 tablet at night for Cholesterol  . pyridOXINE (VITAMIN B-6) 100 MG tablet Take 100 mg by  mouth daily.  [place] triamterene-hydrochlorothiazide (MAXZIDE-25) 37.5-25 MG tablet Take 1/2- 1 tablet daily for BP & fluid  . phentermine (ADIPEX-P) 37.5 MG tablet Take 1 tablet (37.5 mg total) by mouth daily before breakfast. (Patient not taking: Reported on 09/20/2017)   Current Facility-Administered Medications on File Prior to Visit  Medication  . betamethasone acetate-betamethasone sodium phosphate (CELESTONE) injection 3 mg   No Known Allergies PMHx:   Past Medical History:  Diagnosis Date  . Allergy   . Anxiety   . Bell palsy 02/04/2016   Left  . Cancer ([place]) 2011   BCC  . Hyperlipidemia   . Hypertension 2005  . Plantar fasciitis    Immunization History  Administered Date(s) Administered  . PPD Test 05/14/2014  . Pneumococcal Polysaccharide-23 02/08/2017   Past Surgical History:  Procedure Laterality Date  . ABDOMINAL HYSTERECTOMY  1990  . basal cell carcinoma  2011   nose  . TENDON REPAIR     plantar fasciitis  . TONSILLECTOMY     FHx:    Reviewed / unchanged  SHx:    Reviewed / unchanged  Systems Review:  Constitutional: Denies fever, chills, wt changes, headaches, insomnia, fatigue, night sweats, change in appetite. Eyes: Denies redness, blurred vision, diplopia, discharge, itchy, watery eyes.  ENT: Denies discharge, congestion, post nasal drip, epistaxis, sore throat, earache, hearing  loss, dental pain, tinnitus, vertigo, sinus pain, snoring.  CV: Denies chest pain, palpitations, irregular heartbeat, syncope, dyspnea, diaphoresis, orthopnea, PND, claudication or edema. Respiratory: denies cough, dyspnea, DOE, pleurisy, hoarseness, laryngitis, wheezing.  Gastrointestinal: Denies dysphagia, odynophagia, heartburn, reflux, water brash, abdominal pain or cramps, nausea, vomiting, bloating, diarrhea, constipation, hematemesis, melena, hematochezia  or hemorrhoids. Genitourinary: Denies dysuria, frequency, urgency, nocturia, hesitancy, discharge, hematuria or flank  pain. Musculoskeletal: Denies arthralgias, myalgias, stiffness, jt. swelling, pain, limping or strain/sprain.  Skin: Denies pruritus, rash, hives, warts, acne, eczema or change in skin lesion(s). Neuro: No weakness, tremor, incoordination, spasms, paresthesia or pain. Psychiatric: Denies confusion, memory loss or sensory loss. Endo: Denies change in weight, skin or hair change.  Heme/Lymph: No excessive bleeding, bruising or enlarged lymph nodes.  Physical Exam  BP 140/82   Pulse 76   Temp (!) 97.3 F (36.3 C)   Resp 18   Ht 5\' 7"  (1.702 m)   Wt 175 lb 12.8 oz (79.7 kg)   BMI 27.53 kg/m   Appears well nourished, well groomed  and in no distress.  Eyes: PERRLA, EOMs, conjunctiva no swelling or erythema. Sinuses: No frontal/maxillary tenderness ENT/Mouth: EAC's clear, TM's nl w/o erythema, bulging. Nares clear w/o erythema, swelling, exudates. Oropharynx clear without erythema or exudates. Oral hygiene is good. Tongue normal, non obstructing. Hearing intact.  Neck: Supple. Thyroid nl. Car 2+/2+ without bruits, nodes or JVD. Chest: Respirations nl with BS clear & equal w/o rales, rhonchi, wheezing or stridor.  Cor: Heart sounds normal w/ regular rate and rhythm without sig. murmurs, gallops, clicks or rubs. Peripheral pulses normal and equal  without edema.  Abdomen: Soft & bowel sounds normal. Non-tender w/o guarding, rebound, hernias, masses or organomegaly.  Lymphatics: Unremarkable.  Musculoskeletal: Full ROM all peripheral extremities, joint stability, 5/5 strength and normal gait.  Skin: Warm, dry without exposed rashes, lesions or ecchymosis apparent.  Neuro: Cranial nerves intact, reflexes equal bilaterally. Sensory-motor testing grossly intact. Tendon reflexes grossly intact.  Pysch: Alert & oriented x 3.  Insight and judgement nl & appropriate. No ideations.  Assessment and Plan:  1. Hypertension, benign essential, goal below 140/90  - Continue medication, monitor blood  pressure at home.  - Continue DASH diet. Reminder to go to the ER if any CP,  SOB, nausea, dizziness, severe HA, changes vision/speech. - CBC with Differential/Platelet - BASIC METABOLIC PANEL WITH GFR - Magnesium - TSH  2. Hyperlipidemia, mixed  - Continue diet/meds, exercise,& lifestyle modifications.  - Continue monitor periodic cholesterol/liver & renal functions  - Hepatic function panel - Lipid panel - TSH  3. Abnormal glucose  - Continue diet, exercise, lifestyle modifications.  - Monitor appropriate labs.  - Hemoglobin A1c - Insulin, random  4. Vitamin D deficiency  - Continue supplementation.  - VITAMIN D 25 Hydroxy  5. Gout of big toe  - Uric acid  6. Medication management  - CBC with Differential/Platelet - BASIC METABOLIC PANEL WITH GFR - Hepatic function panel - Magnesium - Lipid panel - Hemoglobin A1c - Insulin, random - TSH - VITAMIN D 25 Hydroxy  - Uric acid       Discussed  regular exercise, BP monitoring, weight control to achieve/maintain BMI less than 25 and discussed med and SE's. Recommended labs to assess and monitor clinical status with further disposition pending results of labs. Over 30 minutes of exam, counseling, chart review was performed.

## 2017-09-20 NOTE — Progress Notes (Signed)
   HPI: 66 year old female presenting today with a complaint of pain to the right forefoot. She states it feels as if she is walking on a rock or a lump. She reports associated hardness to the area. Wearing Powers Steps provides no significant relief of the pain. She is here for further evaluation and treatment.   Past Medical History:  Diagnosis Date  . Allergy   . Anxiety   . Bell palsy 02/04/2016   Left  . Cancer ([place]) 2011   BCC  . Hyperlipidemia   . Hypertension 2005  . Plantar fasciitis      Physical Exam: General: The patient is alert and oriented x3 in no acute distress.  Dermatology: Skin is warm, dry and supple bilateral lower extremities. Negative for open lesions or macerations.  Vascular: Palpable pedal pulses bilaterally. No edema or erythema noted. Capillary refill within normal limits.  Neurological: Epicritic and protective threshold grossly intact bilaterally.   Musculoskeletal Exam: Pain with palpation to the third and fourth MPJ of the right foot. Range of motion within normal limits to all pedal and ankle joints bilateral. Muscle strength 5/5 in all groups bilateral.    Assessment: - Capsulitis third and fourth MPJ right   Plan of Care:  - Patient evaluated. - Prescription for diclofenac given to patient. - Injection of 0.5 mLs Celestone Soluspan injected into the third and fourth MPJ of the right foot. - Continue wearing Powers Steps insoles. - Return to clinic in 4 weeks.   Edrick Kins, DPM [facility]  Dr. Edrick Kins, DPM    [address], [place] [address]                Office [phone]  Fax [phone]

## 2017-09-20 NOTE — Patient Instructions (Signed)

## 2017-09-21 ENCOUNTER — Other Ambulatory Visit: Payer: Self-pay | Admitting: Internal Medicine

## 2017-09-21 DIAGNOSIS — M109 Gout, unspecified: Secondary | ICD-10-CM

## 2017-09-21 DIAGNOSIS — Z23 Encounter for immunization: Secondary | ICD-10-CM | POA: Diagnosis not present

## 2017-09-21 LAB — CBC WITH DIFFERENTIAL/PLATELET
Basophils Absolute: 40 cells/uL (ref 0–200)
Basophils Relative: 0.6 %
EOS ABS: 40 {cells}/uL (ref 15–500)
Eosinophils Relative: 0.6 %
HCT: 42.2 % (ref 35.0–45.0)
HEMOGLOBIN: 15 g/dL (ref 11.7–15.5)
Lymphs Abs: 2593 cells/uL (ref 850–3900)
MCH: 30.9 pg (ref 27.0–33.0)
MCHC: 35.5 g/dL (ref 32.0–36.0)
MCV: 87 fL (ref 80.0–100.0)
MONOS PCT: 7.9 %
MPV: 9.3 fL (ref 7.5–12.5)
NEUTROS ABS: 3497 {cells}/uL (ref 1500–7800)
Neutrophils Relative %: 52.2 %
Platelets: 228 10*3/uL (ref 140–400)
RBC: 4.85 10*6/uL (ref 3.80–5.10)
RDW: 13 % (ref 11.0–15.0)
Total Lymphocyte: 38.7 %
WBC mixed population: 529 cells/uL (ref 200–950)
WBC: 6.7 10*3/uL (ref 3.8–10.8)

## 2017-09-21 LAB — HEPATIC FUNCTION PANEL
AG RATIO: 1.9 (calc) (ref 1.0–2.5)
ALBUMIN MSPROF: 5.2 g/dL — AB (ref 3.6–5.1)
ALT: 25 U/L (ref 6–29)
AST: 24 U/L (ref 10–35)
Alkaline phosphatase (APISO): 76 U/L (ref 33–130)
Bilirubin, Direct: 0.1 mg/dL (ref 0.0–0.2)
GLOBULIN: 2.8 g/dL (ref 1.9–3.7)
Indirect Bilirubin: 0.7 mg/dL (calc) (ref 0.2–1.2)
TOTAL PROTEIN: 8 g/dL (ref 6.1–8.1)
Total Bilirubin: 0.8 mg/dL (ref 0.2–1.2)

## 2017-09-21 LAB — LIPID PANEL
CHOL/HDL RATIO: 4.6 (calc) (ref <5.0)
CHOLESTEROL: 207 mg/dL — AB (ref <200)
HDL: 45 mg/dL — AB (ref >50)
LDL CHOLESTEROL (CALC): 119 mg/dL — AB
NON-HDL CHOLESTEROL (CALC): 162 mg/dL — AB (ref <130)
Triglycerides: 303 mg/dL — ABNORMAL HIGH (ref <150)

## 2017-09-21 LAB — BASIC METABOLIC PANEL WITH GFR
BUN: 22 mg/dL (ref 7–25)
CALCIUM: 10.5 mg/dL — AB (ref 8.6–10.4)
CHLORIDE: 96 mmol/L — AB (ref 98–110)
CO2: 32 mmol/L (ref 20–32)
CREATININE: 0.9 mg/dL (ref 0.50–0.99)
GFR, Est African American: 77 mL/min/{1.73_m2} (ref >=60)
GFR, Est Non African American: 67 mL/min/{1.73_m2} (ref >=60)
GLUCOSE: 92 mg/dL (ref 65–99)
Potassium: 3.6 mmol/L (ref 3.5–5.3)
Sodium: 138 mmol/L (ref 135–146)

## 2017-09-21 LAB — INSULIN, RANDOM: Insulin: 9.3 u[IU]/mL (ref 2.0–19.6)

## 2017-09-21 LAB — HEMOGLOBIN A1C
Hgb A1c MFr Bld: 5.6 % of total Hgb (ref <5.7)
MEAN PLASMA GLUCOSE: 114 (calc)
eAG (mmol/L): 6.3 (calc)

## 2017-09-21 LAB — VITAMIN D 25 HYDROXY (VIT D DEFICIENCY, FRACTURES): VIT D 25 HYDROXY: 32 ng/mL (ref 30–100)

## 2017-09-21 LAB — TSH: TSH: 0.86 m[IU]/L (ref 0.40–4.50)

## 2017-09-21 LAB — MAGNESIUM: MAGNESIUM: 2.1 mg/dL (ref 1.5–2.5)

## 2017-09-21 LAB — URIC ACID: URIC ACID, SERUM: 8 mg/dL — AB (ref 2.5–7.0)

## 2017-09-21 MED ORDER — ALLOPURINOL 300 MG PO TABS: ORAL_TABLET | ORAL | 1 refills | Status: DC

## 2017-09-22 ENCOUNTER — Encounter: Payer: Self-pay | Admitting: Internal Medicine

## 2017-09-23 ENCOUNTER — Encounter: Payer: Self-pay | Admitting: Internal Medicine

## 2017-09-23 DIAGNOSIS — K760 Fatty (change of) liver, not elsewhere classified: Secondary | ICD-10-CM | POA: Insufficient documentation

## 2017-10-16 ENCOUNTER — Ambulatory Visit (INDEPENDENT_AMBULATORY_CARE_PROVIDER_SITE_OTHER): Payer: Medicare Other | Admitting: Podiatry

## 2017-10-16 ENCOUNTER — Ambulatory Visit: Payer: Medicare Other | Admitting: Podiatry

## 2017-10-16 ENCOUNTER — Encounter: Payer: Self-pay | Admitting: Podiatry

## 2017-10-16 DIAGNOSIS — M7751 Other enthesopathy of right foot: Secondary | ICD-10-CM | POA: Diagnosis not present

## 2017-10-16 DIAGNOSIS — Z1231 Encounter for screening mammogram for malignant neoplasm of breast: Secondary | ICD-10-CM | POA: Diagnosis not present

## 2017-10-16 LAB — HM MAMMOGRAPHY

## 2017-10-17 ENCOUNTER — Ambulatory Visit (INDEPENDENT_AMBULATORY_CARE_PROVIDER_SITE_OTHER): Payer: Medicare Other | Admitting: Orthotics

## 2017-10-17 DIAGNOSIS — M7989 Other specified soft tissue disorders: Secondary | ICD-10-CM

## 2017-10-17 DIAGNOSIS — M2011 Hallux valgus (acquired), right foot: Secondary | ICD-10-CM

## 2017-10-17 DIAGNOSIS — M799 Soft tissue disorder, unspecified: Secondary | ICD-10-CM

## 2017-10-17 DIAGNOSIS — Z9889 Other specified postprocedural states: Secondary | ICD-10-CM

## 2017-10-17 DIAGNOSIS — M7751 Other enthesopathy of right foot: Secondary | ICD-10-CM

## 2017-10-17 DIAGNOSIS — M21611 Bunion of right foot: Secondary | ICD-10-CM

## 2017-10-21 NOTE — Progress Notes (Signed)
   HPI: 66 year old female presenting today for follow up evaluation of right foot pain. She states the pain is still present but the last injection helped provide some relief. There are no worsening factors noted. Patient is here for further evaluation and treatment.   Past Medical History:  Diagnosis Date  . Allergy   . Anxiety   . Bell palsy 02/04/2016   Left  . Cancer ([place]) 2011   BCC  . Hyperlipidemia   . Hypertension 2005  . Plantar fasciitis      Physical Exam: General: The patient is alert and oriented x3 in no acute distress.  Dermatology: Skin is warm, dry and supple bilateral lower extremities. Negative for open lesions or macerations.  Vascular: Palpable pedal pulses bilaterally. No edema or erythema noted. Capillary refill within normal limits.  Neurological: Epicritic and protective threshold grossly intact bilaterally.   Musculoskeletal Exam: Pain with palpation to the third and fourth MPJ of the right foot. Range of motion within normal limits to all pedal and ankle joints bilateral. Muscle strength 5/5 in all groups bilateral.    Assessment: - Capsulitis third and fourth MPJ right   Plan of Care:  - Patient evaluated. - Injection of 0.5 mLs Celestone Soluspan injected into the third and fourth MPJ of the right foot. - Appointment with Liliane Channel for custom molded orthotics with met pad and 2nd MPJ offloading.  - Return to clinic in 8 weeks.   Edrick Kins, DPM [facility]  Dr. Edrick Kins, DPM    [address], [place] [address]                Office [phone]  Fax [phone]

## 2017-10-22 ENCOUNTER — Encounter: Payer: Self-pay | Admitting: Internal Medicine

## 2017-10-23 NOTE — Progress Notes (Signed)
Patient came into today to be cast for Custom Foot Orthotics. Upon recommendation of Dr. Amalia Hailey Patient presents with metatarsalgia, capsulitis 3/4 R MPJ. Cavus foot. Goals are offload 3/4 MPJ R and forefoot cushioning, Plan vendor Richy  300. Self pay.

## 2017-11-01 ENCOUNTER — Encounter (INDEPENDENT_AMBULATORY_CARE_PROVIDER_SITE_OTHER): Payer: Self-pay

## 2017-11-13 ENCOUNTER — Ambulatory Visit (INDEPENDENT_AMBULATORY_CARE_PROVIDER_SITE_OTHER): Payer: Medicare Other | Admitting: Orthotics

## 2017-11-13 DIAGNOSIS — M722 Plantar fascial fibromatosis: Secondary | ICD-10-CM

## 2017-11-13 DIAGNOSIS — M2011 Hallux valgus (acquired), right foot: Secondary | ICD-10-CM

## 2017-11-13 DIAGNOSIS — M7751 Other enthesopathy of right foot: Secondary | ICD-10-CM

## 2017-11-13 DIAGNOSIS — M21611 Bunion of right foot: Secondary | ICD-10-CM

## 2017-11-13 NOTE — Progress Notes (Signed)
Patient came in today to pick up custom made foot orthotics.  The goals were accomplished and the patient reported no dissatisfaction with said orthotics.  Patient was advised of breakin period and how to report any issues. 

## 2017-11-28 ENCOUNTER — Ambulatory Visit: Payer: Medicare Other | Admitting: Orthotics

## 2017-11-28 DIAGNOSIS — M2011 Hallux valgus (acquired), right foot: Secondary | ICD-10-CM

## 2017-11-28 DIAGNOSIS — M21611 Bunion of right foot: Principal | ICD-10-CM

## 2017-11-28 DIAGNOSIS — M2041 Other hammer toe(s) (acquired), right foot: Secondary | ICD-10-CM

## 2017-11-28 NOTE — Progress Notes (Signed)
Redo foot orthotics, lower arch, remove met, softer material

## 2017-12-11 ENCOUNTER — Encounter: Payer: Self-pay | Admitting: Podiatry

## 2017-12-11 ENCOUNTER — Ambulatory Visit (INDEPENDENT_AMBULATORY_CARE_PROVIDER_SITE_OTHER): Payer: Medicare Other | Admitting: Podiatry

## 2017-12-11 DIAGNOSIS — M7751 Other enthesopathy of right foot: Secondary | ICD-10-CM

## 2017-12-12 ENCOUNTER — Encounter: Payer: Medicare Other | Admitting: Orthotics

## 2017-12-13 NOTE — Progress Notes (Signed)
   HPI: 67 year old female presenting today for follow up evaluation of right foot pain. She reports continued stinging, burning pain to the right forefoot. She reports associated swelling and bruising of the dorsum of the foot. She has an appointment tomorrow with Liliane Channel to have her orthotics modified. Patient is here for further evaluation and treatment.   Past Medical History:  Diagnosis Date  . Allergy   . Anxiety   . Bell palsy 02/04/2016   Left  . Cancer ([place]) 2011   BCC  . Hyperlipidemia   . Hypertension 2005  . Plantar fasciitis      Physical Exam: General: The patient is alert and oriented x3 in no acute distress.  Dermatology: Skin is warm, dry and supple bilateral lower extremities. Negative for open lesions or macerations.  Vascular: Palpable pedal pulses bilaterally. No edema or erythema noted. Capillary refill within normal limits.  Neurological: Epicritic and protective threshold grossly intact bilaterally.   Musculoskeletal Exam: Pain with palpation to the third and fourth MPJ of the right foot. Range of motion within normal limits to all pedal and ankle joints bilateral. Muscle strength 5/5 in all groups bilateral.    Assessment: - Capsulitis third and fourth MPJ right   Plan of Care:  - Patient evaluated. - Injection of 0.5 mLs Celestone Soluspan injected into the third and fourth MPJ of the right foot. - Follow up with Rick on 12/12/17 for orthotics modifications. - Recommended good shoe gear. - Revitaderm cream given to patient for plantar foot calluses.  - Return to clinic in 8 weeks.   Edrick Kins, DPM [facility]  Dr. Edrick Kins, DPM    [address], [place] [address]                Office [phone]  Fax [phone]

## 2017-12-19 ENCOUNTER — Ambulatory Visit: Payer: Medicare Other | Admitting: Orthotics

## 2017-12-19 DIAGNOSIS — M21611 Bunion of right foot: Secondary | ICD-10-CM

## 2017-12-19 DIAGNOSIS — M2041 Other hammer toe(s) (acquired), right foot: Secondary | ICD-10-CM

## 2017-12-19 DIAGNOSIS — M2011 Hallux valgus (acquired), right foot: Secondary | ICD-10-CM

## 2017-12-19 NOTE — Progress Notes (Signed)
Patient came in today to pick up custom made foot orthotics.  The goals were accomplished and the patient reported no dissatisfaction with said orthotics.  Patient was advised of breakin period and how to report any issues. 

## 2017-12-28 ENCOUNTER — Ambulatory Visit: Payer: Self-pay | Admitting: Adult Health

## 2018-01-11 DIAGNOSIS — M51369 Other intervertebral disc degeneration, lumbar region without mention of lumbar back pain or lower extremity pain: Secondary | ICD-10-CM | POA: Insufficient documentation

## 2018-01-11 DIAGNOSIS — M5136 Other intervertebral disc degeneration, lumbar region: Secondary | ICD-10-CM | POA: Diagnosis not present

## 2018-01-18 ENCOUNTER — Ambulatory Visit: Payer: Self-pay | Admitting: Internal Medicine

## 2018-01-22 DIAGNOSIS — M5136 Other intervertebral disc degeneration, lumbar region: Secondary | ICD-10-CM | POA: Diagnosis not present

## 2018-02-01 ENCOUNTER — Other Ambulatory Visit: Payer: Self-pay | Admitting: Internal Medicine

## 2018-02-05 ENCOUNTER — Encounter: Payer: Self-pay | Admitting: Podiatry

## 2018-02-05 ENCOUNTER — Telehealth: Payer: Self-pay | Admitting: Podiatry

## 2018-02-05 ENCOUNTER — Ambulatory Visit (INDEPENDENT_AMBULATORY_CARE_PROVIDER_SITE_OTHER): Payer: Medicare Other | Admitting: Podiatry

## 2018-02-05 DIAGNOSIS — M7751 Other enthesopathy of right foot: Secondary | ICD-10-CM | POA: Diagnosis not present

## 2018-02-05 DIAGNOSIS — G5791 Unspecified mononeuropathy of right lower limb: Secondary | ICD-10-CM

## 2018-02-05 MED ORDER — GABAPENTIN 100 MG PO CAPS: 100.0000 mg | ORAL_CAPSULE | Freq: Three times a day (TID) | ORAL | 1 refills | Status: DC

## 2018-02-05 NOTE — Telephone Encounter (Signed)
Patient came back into office and I educated her on the side effects of Gabapentin and informed her to try at night time for now to see how it makes her feel.  She will call us back if she has any trouble with taking the medicaiton.

## 2018-02-05 NOTE — Telephone Encounter (Signed)
Patient called the office said that Dr. Amalia Hailey called her in [place] today and after reading the side effects of this medicine she doesn't want to take it, Wants dr,. Evans or a nurse to call her back about this.

## 2018-02-06 DIAGNOSIS — M7062 Trochanteric bursitis, left hip: Secondary | ICD-10-CM | POA: Diagnosis not present

## 2018-02-06 NOTE — Progress Notes (Signed)
   HPI: 67 year old female presenting today for follow up evaluation of right foot pain. She states her pain is unchanged and is still experiencing burning and stinging pain. She has been wearing inserts which help alleviate the pain caused by walking. Patient is here for further evaluation and treatment.   Past Medical History:  Diagnosis Date  . Allergy   . Anxiety   . Bell palsy 02/04/2016   Left  . Cancer ([place]) 2011   BCC  . Hyperlipidemia   . Hypertension 2005  . Plantar fasciitis      Physical Exam: General: The patient is alert and oriented x3 in no acute distress.  Dermatology: Skin is warm, dry and supple bilateral lower extremities. Negative for open lesions or macerations.  Vascular: Palpable pedal pulses bilaterally. No edema or erythema noted. Capillary refill within normal limits.  Neurological: Epicritic and protective threshold grossly intact bilaterally. Burning and stinging sensation localized to the right lateral forefoot.   Musculoskeletal Exam: Pain with palpation to the third and fourth MPJ of the right foot. Range of motion within normal limits to all pedal and ankle joints bilateral. Muscle strength 5/5 in all groups bilateral.    Assessment: - Capsulitis third and fourth MPJ right - neuritis right lateral forefoot    Plan of Care:  - Patient evaluated. - Injection of 0.5 mLs Celestone Soluspan injected into the third and fourth MPJ of the right foot. - Prescription for Gabapentin 100 mg three times daily given to patient.  - Continue wearing custom molded orthotics.  - Return to clinic in 6 weeks.    Edrick Kins, DPM [facility]  Dr. Edrick Kins, DPM    [address], [place] [address]                Office [phone]  Fax [phone]

## 2018-02-08 ENCOUNTER — Encounter: Payer: Self-pay | Admitting: Internal Medicine

## 2018-02-19 ENCOUNTER — Ambulatory Visit: Payer: Self-pay | Admitting: Internal Medicine

## 2018-02-22 ENCOUNTER — Ambulatory Visit: Payer: Self-pay | Admitting: Internal Medicine

## 2018-03-06 DIAGNOSIS — D225 Melanocytic nevi of trunk: Secondary | ICD-10-CM | POA: Diagnosis not present

## 2018-03-06 DIAGNOSIS — Z1283 Encounter for screening for malignant neoplasm of skin: Secondary | ICD-10-CM | POA: Diagnosis not present

## 2018-03-06 DIAGNOSIS — D1801 Hemangioma of skin and subcutaneous tissue: Secondary | ICD-10-CM | POA: Diagnosis not present

## 2018-03-06 DIAGNOSIS — L821 Other seborrheic keratosis: Secondary | ICD-10-CM | POA: Diagnosis not present

## 2018-03-06 DIAGNOSIS — Z85828 Personal history of other malignant neoplasm of skin: Secondary | ICD-10-CM | POA: Diagnosis not present

## 2018-03-06 DIAGNOSIS — L578 Other skin changes due to chronic exposure to nonionizing radiation: Secondary | ICD-10-CM | POA: Diagnosis not present

## 2018-03-06 DIAGNOSIS — L570 Actinic keratosis: Secondary | ICD-10-CM | POA: Diagnosis not present

## 2018-03-06 DIAGNOSIS — L820 Inflamed seborrheic keratosis: Secondary | ICD-10-CM | POA: Diagnosis not present

## 2018-03-06 DIAGNOSIS — L812 Freckles: Secondary | ICD-10-CM | POA: Diagnosis not present

## 2018-03-12 ENCOUNTER — Ambulatory Visit: Payer: Self-pay | Admitting: Internal Medicine

## 2018-03-19 ENCOUNTER — Encounter: Payer: Self-pay | Admitting: Podiatry

## 2018-03-19 ENCOUNTER — Ambulatory Visit (INDEPENDENT_AMBULATORY_CARE_PROVIDER_SITE_OTHER): Payer: Medicare Other | Admitting: Podiatry

## 2018-03-19 DIAGNOSIS — M779 Enthesopathy, unspecified: Secondary | ICD-10-CM | POA: Diagnosis not present

## 2018-03-19 DIAGNOSIS — M7751 Other enthesopathy of right foot: Secondary | ICD-10-CM

## 2018-03-19 NOTE — Progress Notes (Signed)
   HPI: 67 year old female presenting today for follow up evaluation of capsulitis of the 3rd and 4th MPJs as well as neuritis of the right foot. She states the pain has not changed since her last visit and is constant. She did not take the gabapentin and states she is fearful to do so. Patient is here for further evaluation and treatment.   Past Medical History:  Diagnosis Date  . Allergy   . Anxiety   . Bell palsy 02/04/2016   Left  . Cancer ([place]) 2011   BCC  . Hyperlipidemia   . Hypertension 2005  . Plantar fasciitis      Physical Exam: General: The patient is alert and oriented x3 in no acute distress.  Dermatology: Skin is warm, dry and supple bilateral lower extremities. Negative for open lesions or macerations.  Vascular: Palpable pedal pulses bilaterally. No edema or erythema noted. Capillary refill within normal limits.  Neurological: Epicritic and protective threshold grossly intact bilaterally.   Musculoskeletal Exam: Pain with palpation to the 2nd MPJ of the right foot. Range of motion within normal limits to all pedal and ankle joints bilateral. Muscle strength 5/5 in all groups bilateral.   Assessment: 1. 2nd MPJ capsulitis right    Plan of Care:  1. Patient evaluated.   2. Injection of 0.5 mLs Celestone Soluspan injected into the 2nd MPJ of the right foot.  3. Appointment with Liliane Channel to modify custom molded orthotics.  4. Discussed possible weil surgery of 2nd metatarsal to offload pressure as a future option.  5. Return to clinic as needed.       Edrick Kins, DPM [facility]  Dr. Edrick Kins, DPM    [address], [place] [address]                Office [phone]  Fax [phone]

## 2018-03-20 ENCOUNTER — Ambulatory Visit: Payer: Medicare Other | Admitting: Orthotics

## 2018-03-20 DIAGNOSIS — M2041 Other hammer toe(s) (acquired), right foot: Secondary | ICD-10-CM

## 2018-03-20 NOTE — Progress Notes (Signed)
Added ppt padding forefoot R; patient advised to f/up in three weeks to make addition permanent.

## 2018-03-27 ENCOUNTER — Ambulatory Visit: Payer: Medicare Other | Admitting: Orthotics

## 2018-03-27 ENCOUNTER — Other Ambulatory Visit: Payer: Self-pay | Admitting: *Deleted

## 2018-03-27 ENCOUNTER — Ambulatory Visit (INDEPENDENT_AMBULATORY_CARE_PROVIDER_SITE_OTHER): Payer: Medicare Other | Admitting: Internal Medicine

## 2018-03-27 VITALS — BP 120/88 | HR 72 | Temp 97.4°F | Resp 16 | Ht 67.0 in | Wt 173.6 lb

## 2018-03-27 DIAGNOSIS — M109 Gout, unspecified: Secondary | ICD-10-CM | POA: Diagnosis not present

## 2018-03-27 DIAGNOSIS — Z79899 Other long term (current) drug therapy: Secondary | ICD-10-CM

## 2018-03-27 DIAGNOSIS — R7309 Other abnormal glucose: Secondary | ICD-10-CM | POA: Diagnosis not present

## 2018-03-27 DIAGNOSIS — E782 Mixed hyperlipidemia: Secondary | ICD-10-CM | POA: Diagnosis not present

## 2018-03-27 DIAGNOSIS — I10 Essential (primary) hypertension: Secondary | ICD-10-CM

## 2018-03-27 DIAGNOSIS — M79671 Pain in right foot: Secondary | ICD-10-CM

## 2018-03-27 DIAGNOSIS — E559 Vitamin D deficiency, unspecified: Secondary | ICD-10-CM

## 2018-03-27 DIAGNOSIS — M2041 Other hammer toe(s) (acquired), right foot: Secondary | ICD-10-CM

## 2018-03-27 LAB — CBC WITH DIFFERENTIAL/PLATELET
Basophils Absolute: 42 cells/uL (ref 0–200)
Basophils Relative: 0.7 %
EOS ABS: 120 {cells}/uL (ref 15–500)
EOS PCT: 2 %
HEMATOCRIT: 42.8 % (ref 35.0–45.0)
HEMOGLOBIN: 15.5 g/dL (ref 11.7–15.5)
LYMPHS ABS: 2244 {cells}/uL (ref 850–3900)
MCH: 31.3 pg (ref 27.0–33.0)
MCHC: 36.2 g/dL — AB (ref 32.0–36.0)
MCV: 86.5 fL (ref 80.0–100.0)
MPV: 9.2 fL (ref 7.5–12.5)
Monocytes Relative: 8.2 %
NEUTROS ABS: 3102 {cells}/uL (ref 1500–7800)
NEUTROS PCT: 51.7 %
Platelets: 212 10*3/uL (ref 140–400)
RBC: 4.95 10*6/uL (ref 3.80–5.10)
RDW: 13.5 % (ref 11.0–15.0)
Total Lymphocyte: 37.4 %
WBC mixed population: 492 cells/uL (ref 200–950)
WBC: 6 10*3/uL (ref 3.8–10.8)

## 2018-03-27 LAB — TSH: TSH: 1.33 mIU/L (ref 0.40–4.50)

## 2018-03-27 MED ORDER — ALLOPURINOL 300 MG PO TABS: ORAL_TABLET | ORAL | 1 refills | Status: DC

## 2018-03-27 MED ORDER — PRAVASTATIN SODIUM 20 MG PO TABS: ORAL_TABLET | ORAL | 1 refills | Status: DC

## 2018-03-27 MED ORDER — PHENTERMINE HCL 37.5 MG PO TABS: 37.5000 mg | ORAL_TABLET | Freq: Every day | ORAL | 5 refills | Status: DC

## 2018-03-27 MED ORDER — TRIAMTERENE-HCTZ 37.5-25 MG PO TABS: ORAL_TABLET | ORAL | 1 refills | Status: DC

## 2018-03-27 NOTE — Progress Notes (Signed)
Trimmed extrinsic poron lateral aspect of ff.  Told her if she wanted this permanent, to drop off and I would send off to [facility].

## 2018-03-27 NOTE — Progress Notes (Signed)
This very nice 67 y.o.  MWF presents overdue for 3 month follow up with HTN, HLD, Pre-Diabetes and Vitamin D Deficiency. Patient also has hx/o Fatty liver by U/S (2005). Patient had Colonoscopies in 2006 & 2011 w/ an adenomatous polyp ([place]). She also has hx/o Gout controlled on Allopurinol.  Patient relates ongoing problems /pain in the Rt foot and has gad Rt foot surgery x 2 and is requesting a 2sd independent opinion. She is encouraged to start a Rx for Gabapentin that she's not yet started. She also reports recent Lt hip steroid injection at the [facility].      Patient is treated for HTN (1998)  & BP has been controlled at home. Today's BP is borderline elevated at  120/88. Patient has had no complaints of any cardiac type chest pain, palpitations, dyspnea / orthopnea / PND, dizziness, claudication, or dependent edema.     Hyperlipidemia (2005)  is not controlled with diet & meds. Patient denies myalgias or other med SE's. Last Lipids were not at goal:  Lab Results  Component Value Date   CHOL 207 (H) 09/20/2017   HDL 45 (L) 09/20/2017   LDLCALC 119 (H) 09/20/2017   TRIG 303 (H) 09/20/2017   CHOLHDL 4.6 09/20/2017      Also, the patient has history of PreDiabetes (A1c5.9%/2012 and 5.7%/2015) and has had no symptoms of reactive hypoglycemia, diabetic polys, paresthesias or visual blurring.  Last A1c was Normal & at goal: Lab Results  Component Value Date   HGBA1C 5.6 09/20/2017      Further, the patient also has history of Vitamin D Deficiency and supplements vitamin D without any suspected side-effects. Last vitamin D was still low :  Lab Results  Component Value Date   VD25OH 32 09/20/2017   Current Outpatient Medications on File Prior to Visit  Medication Sig  . aspirin EC 81 MG tablet Take 81 mg by mouth daily.  . Biotin 10 MG TABS Take by mouth daily.  . calcium carbonate (OSCAL) 1500 (600 Ca) MG TABS tablet Take by mouth daily with breakfast.  .  Cholecalciferol (VITAMIN D PO) Take 10,000 Units by mouth daily.  . Cyanocobalamin (VITAMIN B-12 PO) Take 1 tablet by mouth daily.  . phentermine (ADIPEX-P) 37.5 MG tablet Take 1 tablet (37.5 mg total) by mouth daily before breakfast.  . pyridOXINE (VITAMIN B-6) 100 MG tablet Take 100 mg by mouth daily.   Current Facility-Administered Medications on File Prior to Visit  Medication  . betamethasone acetate-betamethasone sodium phosphate (CELESTONE) injection 3 mg   No Known Allergies PMHx:   Past Medical History:  Diagnosis Date  . Allergy   . Anxiety   . Bell palsy 02/04/2016   Left  . Cancer ([place]) 2011   BCC  . Hyperlipidemia   . Hypertension 2005  . Plantar fasciitis    Immunization History  Administered Date(s) Administered  . PPD Test 05/14/2014  . Pneumococcal Polysaccharide-23 02/08/2017   Past Surgical History:  Procedure Laterality Date  . ABDOMINAL HYSTERECTOMY  1990  . basal cell carcinoma  2011   nose  . TENDON REPAIR     plantar fasciitis  . TONSILLECTOMY     FHx:    Reviewed / unchanged  SHx:    Reviewed / unchanged  Systems Review:  Constitutional: Denies fever, chills, wt changes, headaches, insomnia, fatigue, night sweats, change in appetite. Eyes: Denies redness, blurred vision, diplopia, discharge, itchy, watery eyes.  ENT: Denies discharge, congestion,  post nasal drip, epistaxis, sore throat, earache, hearing loss, dental pain, tinnitus, vertigo, sinus pain, snoring.  CV: Denies chest pain, palpitations, irregular heartbeat, syncope, dyspnea, diaphoresis, orthopnea, PND, claudication or edema. Respiratory: denies cough, dyspnea, DOE, pleurisy, hoarseness, laryngitis, wheezing.  Gastrointestinal: Denies dysphagia, odynophagia, heartburn, reflux, water brash, abdominal pain or cramps, nausea, vomiting, bloating, diarrhea, constipation, hematemesis, melena, hematochezia  or hemorrhoids. Genitourinary: Denies dysuria, frequency, urgency, nocturia,  hesitancy, discharge, hematuria or flank pain. Musculoskeletal: Denies arthralgias, myalgias, stiffness, jt. swelling, pain, limping or strain/sprain.  Skin: Denies pruritus, rash, hives, warts, acne, eczema or change in skin lesion(s). Neuro: No weakness, tremor, incoordination, spasms, paresthesia or pain. Psychiatric: Denies confusion, memory loss or sensory loss. Endo: Denies change in weight, skin or hair change.  Heme/Lymph: No excessive bleeding, bruising or enlarged lymph nodes.  Physical Exam  BP 120/88   Pulse 72   Temp (!) 97.4 F (36.3 C)   Resp 16   Ht 5\' 7"  (1.702 m)   Wt 173 lb 9.6 oz (78.7 kg)   BMI 27.19 kg/m   Appears  well nourished, well groomed  and in no distress.  Eyes: PERRLA, EOMs, conjunctiva no swelling or erythema. Sinuses: No frontal/maxillary tenderness ENT/Mouth: EAC's clear, TM's nl w/o erythema, bulging. Nares clear w/o erythema, swelling, exudates. Oropharynx clear without erythema or exudates. Oral hygiene is good. Tongue normal, non obstructing. Hearing intact.  Neck: Supple. Thyroid not palpable. Car 2+/2+ without bruits, nodes or JVD. Chest: Respirations nl with BS clear & equal w/o rales, rhonchi, wheezing or stridor.  Cor: Heart sounds normal w/ regular rate and rhythm without sig. murmurs, gallops, clicks or rubs. Peripheral pulses normal and equal  without edema.  Abdomen: Soft & bowel sounds normal. Non-tender w/o guarding, rebound, hernias, masses or organomegaly.  Lymphatics: Unremarkable.  Musculoskeletal: Full ROM all peripheral extremities, joint stability, 5/5 strength and normal gait.  Skin: Warm, dry without exposed rashes, lesions or ecchymosis apparent.  Neuro: Cranial nerves intact, reflexes equal bilaterally. Sensory-motor testing grossly intact. Tendon reflexes grossly intact.  Pysch: Alert & oriented x 3.  Insight and judgement nl & appropriate. No ideations.  Assessment and Plan:  1. Hypertension, benign essential, goal  below 140/90  - Continue medication, monitor blood pressure at home.  - Continue DASH diet.  Reminder to go to the ER if any CP,  SOB, nausea, dizziness, severe HA, changes vision/speech.  - CBC with Differential/Platelet - COMPLETE METABOLIC PANEL WITH GFR - Magnesium - TSH  2. Hyperlipidemia, mixed  - Continue diet/meds, exercise,& lifestyle modifications.  - Continue monitor periodic cholesterol/liver & renal functions   - Lipid panel - TSH  3. Abnormal glucose  - Continue diet, exercise, lifestyle modifications.  - Monitor appropriate labs.  - Hemoglobin A1c - Insulin, random  4. Vitamin D deficiency  - Continue supplementation.   - VITAMIN D 25 Hydroxy   5. Gout of big toe  - Uric acid  6. Medication management  - CBC with Differential/Platelet - COMPLETE METABOLIC PANEL WITH GFR - Magnesium - Lipid panel - TSH - Hemoglobin A1c - Insulin, random - VITAMIN D 25 Hydroxy (Vit-D Deficiency, Fractures) - Uric acid  7. Right foot pain  - Ambulatory referral to Orthopedic Surgery              Discussed  regular exercise, BP monitoring, weight control to achieve/maintain BMI less than 25 and discussed med and SE's. Recommended labs to assess and monitor clinical status with further disposition pending results of labs.  Over 30 minutes of exam, counseling, chart review was performed.

## 2018-03-27 NOTE — Patient Instructions (Signed)
Recommend Adult Low Dose Aspirin or  coated  Aspirin 81 mg daily  To reduce risk of Colon Cancer 20 %,  Skin Cancer 26 % ,  Melanoma 46%  and  Pancreatic cancer 60% +++++++++++++++++++++++++ Vitamin D goal  is between 70-100.  Please make sure that you are taking your Vitamin D as directed.  It is very important as a natural anti-inflammatory  helping hair, skin, and nails, as well as reducing stroke and heart attack risk.  It helps your bones and helps with mood. It also decreases numerous cancer risks so please take it as directed.  Low Vit D is associated with a 200-300% higher risk for CANCER  and 200-300% higher risk for HEART   ATTACK  &  STROKE.   ...................................... It is also associated with higher death rate at younger ages,  autoimmune diseases like Rheumatoid arthritis, Lupus, Multiple Sclerosis.    Also many other serious conditions, like depression, Alzheimer's Dementia, infertility, muscle aches, fatigue, fibromyalgia - just to name a few. ++++++++++++++++++++ Recommend the book "The END of DIETING" by Dr Joel Fuhrman  & the book "The END of DIABETES " by Dr Joel Fuhrman At [employer] - get book & Audio CD's    Being diabetic has a  300% increased risk for heart attack, stroke, cancer, and alzheimer- type vascular dementia. It is very important that you work harder with diet by avoiding all foods that are white. Avoid white rice (brown & wild rice is OK), white potatoes (sweetpotatoes in moderation is OK), White bread or wheat bread or anything made out of white flour like bagels, donuts, rolls, buns, biscuits, cakes, pastries, cookies, pizza crust, and pasta (made from white flour & egg whites) - vegetarian pasta or spinach or wheat pasta is OK. Multigrain breads like Arnold's or Pepperidge Farm, or multigrain sandwich thins or flatbreads.  Diet, exercise and weight loss can reverse and cure diabetes in the early stages.  Diet, exercise and weight loss is  very important in the control and prevention of complications of diabetes which affects every system in your body, ie. Brain - dementia/stroke, eyes - glaucoma/blindness, heart - heart attack/heart failure, kidneys - dialysis, stomach - gastric paralysis, intestines - malabsorption, nerves - severe painful neuritis, circulation - gangrene & loss of a leg(s), and finally cancer and Alzheimers.    I recommend avoid fried & greasy foods,  sweets/candy, white rice (brown or wild rice or Quinoa is OK), white potatoes (sweet potatoes are OK) - anything made from white flour - bagels, doughnuts, rolls, buns, biscuits,white and wheat breads, pizza crust and traditional pasta made of white flour & egg white(vegetarian pasta or spinach or wheat pasta is OK).  Multi-grain bread is OK - like multi-grain flat bread or sandwich thins. Avoid alcohol in excess. Exercise is also important.    Eat all the vegetables you want - avoid meat, especially red meat and dairy - especially cheese.  Cheese is the most concentrated form of trans-fats which is the worst thing to clog up our arteries. Veggie cheese is OK which can be found in the fresh produce section at [employer] or [employer] or [employer]  +++++++++++++++++++++ DASH Eating Plan  DASH stands for "Dietary Approaches to Stop Hypertension."   The DASH eating plan is a healthy eating plan that has been shown to reduce high blood pressure (hypertension). Additional health benefits may include reducing the risk of type 2 diabetes mellitus, heart disease, and stroke. The DASH eating plan may also   help with weight loss. WHAT DO I NEED TO KNOW ABOUT THE DASH EATING PLAN? For the DASH eating plan, you will follow these general guidelines:  Choose foods with a percent daily value for sodium of less than 5% (as listed on the food label).  Use salt-free seasonings or herbs instead of table salt or sea salt.  Check with your health care provider or pharmacist before  using salt substitutes.  Eat lower-sodium products, often labeled as "lower sodium" or "no salt added."  Eat fresh foods.  Eat more vegetables, fruits, and low-fat dairy products.  Choose whole grains. Look for the word "whole" as the first word in the ingredient list.  Choose fish   Limit sweets, desserts, sugars, and sugary drinks.  Choose heart-healthy fats.  Eat veggie cheese   Eat more home-cooked food and less restaurant, buffet, and fast food.  Limit fried foods.  Cook foods using methods other than frying.  Limit canned vegetables. If you do use them, rinse them well to decrease the sodium.  When eating at a restaurant, ask that your food be prepared with less salt, or no salt if possible.                      WHAT FOODS CAN I EAT? Read Dr Joel Fuhrman's books on The End of Dieting & The End of Diabetes  Grains Whole grain or whole wheat bread. Brown rice. Whole grain or whole wheat pasta. Quinoa, bulgur, and whole grain cereals. Low-sodium cereals. Corn or whole wheat flour tortillas. Whole grain cornbread. Whole grain crackers. Low-sodium crackers.  Vegetables Fresh or frozen vegetables (raw, steamed, roasted, or grilled). Low-sodium or reduced-sodium tomato and vegetable juices. Low-sodium or reduced-sodium tomato sauce and paste. Low-sodium or reduced-sodium canned vegetables.   Fruits All fresh, canned (in natural juice), or frozen fruits.  Protein Products  All fish and seafood.  Dried beans, peas, or lentils. Unsalted nuts and seeds. Unsalted canned beans.  Dairy Low-fat dairy products, such as skim or 1% milk, 2% or reduced-fat cheeses, low-fat ricotta or cottage cheese, or plain low-fat yogurt. Low-sodium or reduced-sodium cheeses.  Fats and Oils Tub margarines without trans fats. Light or reduced-fat mayonnaise and salad dressings (reduced sodium). Avocado. Safflower, olive, or canola oils. Natural peanut or almond butter.  Other Unsalted popcorn  and pretzels. The items listed above may not be a complete list of recommended foods or beverages. Contact your dietitian for more options.  +++++++++++++++  WHAT FOODS ARE NOT RECOMMENDED? Grains/ White flour or wheat flour White bread. White pasta. White rice. Refined cornbread. Bagels and croissants. Crackers that contain trans fat.  Vegetables  Creamed or fried vegetables. Vegetables in a . Regular canned vegetables. Regular canned tomato sauce and paste. Regular tomato and vegetable juices.  Fruits Dried fruits. Canned fruit in light or heavy syrup. Fruit juice.  Meat and Other Protein Products Meat in general - RED meat & White meat.  Fatty cuts of meat. Ribs, chicken wings, all processed meats as bacon, sausage, bologna, salami, fatback, hot dogs, bratwurst and packaged luncheon meats.  Dairy Whole or 2% milk, cream, half-and-half, and cream cheese. Whole-fat or sweetened yogurt. Full-fat cheeses or blue cheese. Non-dairy creamers and whipped toppings. Processed cheese, cheese spreads, or cheese curds.  Condiments Onion and garlic salt, seasoned salt, table salt, and sea salt. Canned and packaged gravies. Worcestershire sauce. Tartar sauce. Barbecue sauce. Teriyaki sauce. Soy sauce, including reduced sodium. Steak sauce. Fish sauce. Oyster sauce. Cocktail   sauce. Horseradish. Ketchup and mustard. Meat flavorings and tenderizers. Bouillon cubes. Hot sauce. Tabasco sauce. Marinades. Taco seasonings. Relishes.  Fats and Oils Butter, stick margarine, lard, shortening and bacon fat. Coconut, palm kernel, or palm oils. Regular salad dressings.  Pickles and olives. Salted popcorn and pretzels.  The items listed above may not be a complete list of foods and beverages to avoid.   Gout Gout is painful swelling that can occur in some of your joints. Gout is a type of arthritis. This condition is caused by having too much uric acid in your body. Uric acid is a chemical that forms when  your body breaks down substances called purines. Purines are important for building body proteins. When your body has too much uric acid, sharp crystals can form and build up inside your joints. This causes pain and swelling. Gout attacks can happen quickly and be very painful (acute gout). Over time, the attacks can affect more joints and become more frequent (chronic gout). Gout can also cause uric acid to build up under your skin and inside your kidneys. What are the causes? This condition is caused by too much uric acid in your blood. This can occur because:  Your kidneys do not remove enough uric acid from your blood. This is the most common cause.  Your body makes too much uric acid. This can occur with some cancers and cancer treatments. It can also occur if your body is breaking down too many red blood cells (hemolytic anemia).  You eat too many foods that are high in purines. These foods include organ meats and some seafood. Alcohol, especially beer, is also high in purines.  A gout attack may be triggered by trauma or stress. What increases the risk? This condition is more likely to develop in people who:  Have a family history of gout.  Are female and middle-aged.  Are female and have gone through menopause.  Are obese.  Frequently drink alcohol, especially beer.  Are dehydrated.  Lose weight too quickly.  Have an organ transplant.  Have lead poisoning.  Take certain medicines, including aspirin, cyclosporine, diuretics, levodopa, and niacin.  Have kidney disease or psoriasis.  What are the signs or symptoms? An attack of acute gout happens quickly. It usually occurs in just one joint. The most common place is the big toe. Attacks often start at night. Other joints that may be affected include joints of the feet, ankle, knee, fingers, wrist, or elbow. Symptoms may include:  Severe pain.  Warmth.  Swelling.  Stiffness.  Tenderness. The affected joint may be  very painful to touch.  Shiny, red, or purple skin.  Chills and fever.  Chronic gout may cause symptoms more frequently. More joints may be involved. You may also have white or yellow lumps (tophi) on your hands or feet or in other areas near your joints. How is this diagnosed? This condition is diagnosed based on your symptoms, medical history, and physical exam. You may have tests, such as:  Blood tests to measure uric acid levels.  Removal of joint fluid with a needle (aspiration) to look for uric acid crystals.  X-rays to look for joint damage.  How is this treated? Treatment for this condition has two phases: treating an acute attack and preventing future attacks. Acute gout treatment may include medicines to reduce pain and swelling, including:  NSAIDs.  Steroids. These are strong anti-inflammatory medicines that can be taken by mouth (orally) or injected into a joint.  Colchicine.  This medicine relieves pain and swelling when it is taken soon after an attack. It can be given orally or through an IV tube.  Preventive treatment may include:  Daily use of smaller doses of NSAIDs or colchicine.  Use of a medicine that reduces uric acid levels in your blood.  Changes to your diet. You may need to see a specialist about healthy eating (dietitian).  Follow these instructions at home: During a Gout Attack  If directed, apply ice to the affected area: ? Put ice in a plastic bag. ? Place a towel between your skin and the bag. ? Leave the ice on for 20 minutes, 2-3 times a day.  Rest the joint as much as possible. If the affected joint is in your leg, you may be given crutches to use.  Raise (elevate) the affected joint above the level of your heart as often as possible.  Drink enough fluids to keep your urine clear or pale yellow.  Take over-the-counter and prescription medicines only as told by your health care provider.  Do not drive or operate heavy machinery while  taking prescription pain medicine.  Follow instructions from your health care provider about eating or drinking restrictions.  Return to your normal activities as told by your health care provider. Ask your health care provider what activities are safe for you. Avoiding Future Gout Attacks  Follow a low-purine diet as told by your dietitian or health care provider. Avoid foods and drinks that are high in purines, including liver, kidney, anchovies, asparagus, herring, mushrooms, mussels, and beer.  Limit alcohol intake to no more than 1 drink a day for nonpregnant women and 2 drinks a day for men. One drink equals 12 oz of beer, 5 oz of wine, or 1 oz of hard liquor.  Maintain a healthy weight or lose weight if you are overweight. If you want to lose weight, talk with your health care provider. It is important that you do not lose weight too quickly.  Start or maintain an exercise program as told by your health care provider.  Drink enough fluids to keep your urine clear or pale yellow.  Take over-the-counter and prescription medicines only as told by your health care provider.  Keep all follow-up visits as told by your health care provider. This is important. Contact a health care provider if:  You have another gout attack.  You continue to have symptoms of a gout attack after10 days of treatment.  You have side effects from your medicines.  You have chills or a fever.  You have burning pain when you urinate.  You have pain in your lower back or belly. Get help right away if:  You have severe or uncontrolled pain.  You cannot urinate.   Low-Purine Diet Purines are compounds that affect the level of uric acid in your body. A low-purine diet is a diet that is low in purines. Eating a low-purine diet can prevent the level of uric acid in your body from getting too high and causing gout or kidney stones or both. What do I need to know about this diet?  Choose low-purine  foods. Examples of low-purine foods are listed in the next section.  Drink plenty of fluids, especially water. Fluids can help remove uric acid from your body. Try to drink 8-16 cups (1.9-3.8 L) a day.  Limit foods high in fat, especially saturated fat, as fat makes it harder for the body to get rid of uric acid. Foods  high in saturated fat include pizza, cheese, ice cream, whole milk, fried foods, and gravies. Choose foods that are lower in fat and lean sources of protein. Use olive oil when cooking as it contains healthy fats that are not high in saturated fat.  Limit alcohol. Alcohol interferes with the elimination of uric acid from your body. If you are having a gout attack, avoid all alcohol.  Keep in mind that different people's bodies react differently to different foods. You will probably learn over time which foods do or do not affect you. If you discover that a food tends to cause your gout to flare up, avoid eating that food. You can more freely enjoy foods that do not cause problems. If you have any questions about a food item, talk to your dietitian or health care provider. Which foods are low, moderate, and high in purines? The following is a list of foods that are low, moderate, and high in purines. You can eat any amount of the foods that are low in purines. You may be able to have small amounts of foods that are moderate in purines. Ask your health care provider how much of a food moderate in purines you can have. Avoid foods high in purines. Grains  Foods low in purines: Enriched white bread, pasta, rice, cake, cornbread, popcorn.  Foods moderate in purines: Whole-grain breads and cereals, wheat germ, bran, oatmeal. Uncooked oatmeal. Dry wheat bran or wheat germ.  Foods high in purines: Pancakes, [place] toast, biscuits, muffins. Vegetables  Foods low in purines: All vegetables, except those that are moderate in purines.  Foods moderate in purines: Asparagus, cauliflower,  spinach, mushrooms, green peas. Fruits  All fruits are low in purines. Meats and other Protein Foods  Foods low in purines: Eggs, nuts, peanut butter.  Foods moderate in purines: 80-90% lean beef, lamb, veal, pork, poultry, fish, eggs, peanut butter, nuts. Crab, lobster, oysters, and shrimp. Cooked dried beans, peas, and lentils.  Foods high in purines: Anchovies, sardines, herring, mussels, tuna, codfish, scallops, trout, and haddock. Berniece Salines. Organ meats (such as liver or kidney). Tripe. Game meat. Goose. Sweetbreads. Dairy  All dairy foods are low in purines. Low-fat and fat-free dairy products are best because they are low in saturated fat. Beverages  Drinks low in purines: Water, carbonated beverages, tea, coffee, cocoa.  Drinks moderate in purines: Soft drinks and other drinks sweetened with high-fructose corn syrup. Juices. To find whether a food or drink is sweetened with high-fructose corn syrup, look at the ingredients list.  Drinks high in purines: Alcoholic beverages (such as beer). Condiments  Foods low in purines: Salt, herbs, olives, pickles, relishes, vinegar.  Foods moderate in purines: Butter, margarine, oils, mayonnaise. Fats and Oils  Foods low in purines: All types, except gravies and sauces made with meat.  Foods high in purines: Gravies and sauces made with meat. Other Foods  Foods low in purines: Sugars, sweets, gelatin. Cake. Soups made without meat.  Foods moderate in purines: Meat-based or fish-based soups, broths, or bouillons. Foods and drinks sweetened with high-fructose corn syrup.  Foods high in purines: High-fat desserts (such as ice cream, cookies, cakes, pies, doughnuts, and chocolate).

## 2018-03-28 LAB — COMPLETE METABOLIC PANEL WITH GFR
AG Ratio: 1.6 (calc) (ref 1.0–2.5)
ALBUMIN MSPROF: 4.9 g/dL (ref 3.6–5.1)
ALT: 29 U/L (ref 6–29)
AST: 24 U/L (ref 10–35)
Alkaline phosphatase (APISO): 74 U/L (ref 33–130)
BUN: 18 mg/dL (ref 7–25)
CALCIUM: 10.4 mg/dL (ref 8.6–10.4)
CO2: 31 mmol/L (ref 20–32)
CREATININE: 0.85 mg/dL (ref 0.50–0.99)
Chloride: 96 mmol/L — ABNORMAL LOW (ref 98–110)
GFR, EST AFRICAN AMERICAN: 82 mL/min/{1.73_m2} (ref >=60)
GFR, EST NON AFRICAN AMERICAN: 71 mL/min/{1.73_m2} (ref >=60)
GLOBULIN: 3 g/dL (ref 1.9–3.7)
Glucose, Bld: 101 mg/dL — ABNORMAL HIGH (ref 65–99)
Potassium: 3.8 mmol/L (ref 3.5–5.3)
Sodium: 139 mmol/L (ref 135–146)
Total Bilirubin: 0.8 mg/dL (ref 0.2–1.2)
Total Protein: 7.9 g/dL (ref 6.1–8.1)

## 2018-03-28 LAB — MAGNESIUM: Magnesium: 2.2 mg/dL (ref 1.5–2.5)

## 2018-03-28 LAB — LIPID PANEL
CHOL/HDL RATIO: 4.1 (calc) (ref <5.0)
Cholesterol: 203 mg/dL — ABNORMAL HIGH (ref <200)
HDL: 49 mg/dL — ABNORMAL LOW (ref >50)
LDL CHOLESTEROL (CALC): 111 mg/dL — AB
NON-HDL CHOLESTEROL (CALC): 154 mg/dL — AB (ref <130)
Triglycerides: 322 mg/dL — ABNORMAL HIGH (ref <150)

## 2018-03-28 LAB — INSULIN, RANDOM: Insulin: 10.1 u[IU]/mL (ref 2.0–19.6)

## 2018-03-28 LAB — HEMOGLOBIN A1C
HEMOGLOBIN A1C: 5.8 %{Hb} — AB (ref <5.7)
Mean Plasma Glucose: 120 (calc)
eAG (mmol/L): 6.6 (calc)

## 2018-03-28 LAB — VITAMIN D 25 HYDROXY (VIT D DEFICIENCY, FRACTURES): Vit D, 25-Hydroxy: 50 ng/mL (ref 30–100)

## 2018-03-28 LAB — URIC ACID: URIC ACID, SERUM: 6 mg/dL (ref 2.5–7.0)

## 2018-03-30 ENCOUNTER — Encounter: Payer: Self-pay | Admitting: Internal Medicine

## 2018-04-03 ENCOUNTER — Other Ambulatory Visit: Payer: Self-pay | Admitting: Internal Medicine

## 2018-04-03 ENCOUNTER — Encounter: Payer: Self-pay | Admitting: Internal Medicine

## 2018-04-03 DIAGNOSIS — E782 Mixed hyperlipidemia: Secondary | ICD-10-CM

## 2018-04-03 MED ORDER — FENOFIBRATE 145 MG PO TABS: ORAL_TABLET | ORAL | 3 refills | Status: DC

## 2018-04-04 ENCOUNTER — Other Ambulatory Visit: Payer: Self-pay | Admitting: *Deleted

## 2018-04-04 DIAGNOSIS — E782 Mixed hyperlipidemia: Secondary | ICD-10-CM

## 2018-04-04 MED ORDER — FENOFIBRATE 145 MG PO TABS: ORAL_TABLET | ORAL | 3 refills | Status: DC

## 2018-04-05 ENCOUNTER — Encounter: Payer: Self-pay | Admitting: Adult Health

## 2018-04-17 DIAGNOSIS — D485 Neoplasm of uncertain behavior of skin: Secondary | ICD-10-CM | POA: Diagnosis not present

## 2018-04-17 DIAGNOSIS — L986 Other infiltrative disorders of the skin and subcutaneous tissue: Secondary | ICD-10-CM | POA: Diagnosis not present

## 2018-04-17 DIAGNOSIS — L821 Other seborrheic keratosis: Secondary | ICD-10-CM | POA: Diagnosis not present

## 2018-04-17 DIAGNOSIS — Z85828 Personal history of other malignant neoplasm of skin: Secondary | ICD-10-CM | POA: Diagnosis not present

## 2018-04-17 DIAGNOSIS — L578 Other skin changes due to chronic exposure to nonionizing radiation: Secondary | ICD-10-CM | POA: Diagnosis not present

## 2018-04-17 DIAGNOSIS — L570 Actinic keratosis: Secondary | ICD-10-CM | POA: Diagnosis not present

## 2018-04-17 DIAGNOSIS — Z872 Personal history of diseases of the skin and subcutaneous tissue: Secondary | ICD-10-CM | POA: Diagnosis not present

## 2018-04-21 ENCOUNTER — Encounter: Payer: Self-pay | Admitting: Internal Medicine

## 2018-04-21 ENCOUNTER — Other Ambulatory Visit: Payer: Self-pay | Admitting: Internal Medicine

## 2018-04-21 DIAGNOSIS — B029 Zoster without complications: Secondary | ICD-10-CM

## 2018-04-21 MED ORDER — PREDNISONE 20 MG PO TABS: ORAL_TABLET | ORAL | 0 refills | Status: DC

## 2018-04-21 MED ORDER — ACYCLOVIR 800 MG PO TABS: ORAL_TABLET | ORAL | 0 refills | Status: DC

## 2018-04-22 ENCOUNTER — Encounter: Payer: Self-pay | Admitting: Internal Medicine

## 2018-04-22 ENCOUNTER — Other Ambulatory Visit: Payer: Self-pay | Admitting: Internal Medicine

## 2018-04-22 DIAGNOSIS — B029 Zoster without complications: Secondary | ICD-10-CM

## 2018-04-22 MED ORDER — ACYCLOVIR 800 MG PO TABS
ORAL_TABLET | ORAL | 0 refills | Status: DC
Start: 2018-04-22 — End: 2018-08-12

## 2018-04-22 MED ORDER — PREDNISONE 20 MG PO TABS: ORAL_TABLET | ORAL | 0 refills | Status: DC

## 2018-04-25 DIAGNOSIS — L918 Other hypertrophic disorders of the skin: Secondary | ICD-10-CM | POA: Diagnosis not present

## 2018-04-25 DIAGNOSIS — Z85828 Personal history of other malignant neoplasm of skin: Secondary | ICD-10-CM | POA: Diagnosis not present

## 2018-04-25 DIAGNOSIS — L821 Other seborrheic keratosis: Secondary | ICD-10-CM | POA: Diagnosis not present

## 2018-04-25 DIAGNOSIS — L578 Other skin changes due to chronic exposure to nonionizing radiation: Secondary | ICD-10-CM | POA: Diagnosis not present

## 2018-05-10 DIAGNOSIS — M545 Low back pain: Secondary | ICD-10-CM | POA: Diagnosis not present

## 2018-05-10 DIAGNOSIS — M7062 Trochanteric bursitis, left hip: Secondary | ICD-10-CM | POA: Diagnosis not present

## 2018-05-10 DIAGNOSIS — G8929 Other chronic pain: Secondary | ICD-10-CM | POA: Diagnosis not present

## 2018-05-14 ENCOUNTER — Ambulatory Visit (INDEPENDENT_AMBULATORY_CARE_PROVIDER_SITE_OTHER): Payer: Medicare Other | Admitting: Podiatry

## 2018-05-14 ENCOUNTER — Encounter: Payer: Self-pay | Admitting: Podiatry

## 2018-05-14 DIAGNOSIS — M779 Enthesopathy, unspecified: Secondary | ICD-10-CM

## 2018-05-14 DIAGNOSIS — M7751 Other enthesopathy of right foot: Secondary | ICD-10-CM

## 2018-05-17 NOTE — Progress Notes (Signed)
   HPI: 67 year old female presenting today for follow up evaluation of capsulitis of the 2nd MPJ capsulitis of the right foot. She reports some continued pain but states it has improved significantly. The injection she received at her previous visit helped alleviate the pain. Walking long distances increases the pain. Patient is here for further evaluation and treatment.   Past Medical History:  Diagnosis Date  . Allergy   . Anxiety   . Bell palsy 02/04/2016   Left  . Cancer ([place]) 2011   [facility]  . Hyperlipidemia   . Hypertension 2005  . Plantar fasciitis      Physical Exam: General: The patient is alert and oriented x3 in no acute distress.  Dermatology: Skin is warm, dry and supple bilateral lower extremities. Negative for open lesions or macerations.  Vascular: Palpable pedal pulses bilaterally. No edema or erythema noted. Capillary refill within normal limits.  Neurological: Epicritic and protective threshold grossly intact bilaterally.   Musculoskeletal Exam: Pain with palpation to the 2nd MPJ of the right foot. Range of motion within normal limits to all pedal and ankle joints bilateral. Muscle strength 5/5 in all groups bilateral.   Assessment: 1. 2nd MPJ capsulitis right - improved   Plan of Care:  1. Patient evaluated.   2. Injection of 0.5 mLs Celestone Soluspan injected into the 2nd MPJ of the right foot.  3. Continue wearing custom molded orthotics and good shoe gear.  4. Discussed possible weil surgery of the 2nd metatarsal to offload pressure as a future option.  5. Return to clinic as needed.       Edrick Kins, DPM [facility]  Dr. Edrick Kins, DPM    [address], [place] [address]                Office [phone]  Fax [phone]

## 2018-06-17 ENCOUNTER — Other Ambulatory Visit: Payer: Self-pay | Admitting: Internal Medicine

## 2018-06-19 ENCOUNTER — Encounter: Payer: Self-pay | Admitting: Internal Medicine

## 2018-06-19 ENCOUNTER — Other Ambulatory Visit: Payer: Self-pay | Admitting: Internal Medicine

## 2018-06-19 DIAGNOSIS — B029 Zoster without complications: Secondary | ICD-10-CM

## 2018-06-19 MED ORDER — PREDNISONE 20 MG PO TABS
ORAL_TABLET | ORAL | 2 refills | Status: DC
Start: 2018-06-19 — End: 2018-08-12

## 2018-06-26 DIAGNOSIS — L821 Other seborrheic keratosis: Secondary | ICD-10-CM | POA: Diagnosis not present

## 2018-06-26 DIAGNOSIS — Z872 Personal history of diseases of the skin and subcutaneous tissue: Secondary | ICD-10-CM | POA: Diagnosis not present

## 2018-06-26 DIAGNOSIS — L820 Inflamed seborrheic keratosis: Secondary | ICD-10-CM | POA: Diagnosis not present

## 2018-06-26 DIAGNOSIS — L986 Other infiltrative disorders of the skin and subcutaneous tissue: Secondary | ICD-10-CM | POA: Diagnosis not present

## 2018-07-01 ENCOUNTER — Ambulatory Visit: Payer: Self-pay | Admitting: Adult Health

## 2018-07-31 ENCOUNTER — Encounter: Payer: Self-pay | Admitting: Physician Assistant

## 2018-08-05 ENCOUNTER — Ambulatory Visit: Payer: Self-pay | Admitting: Adult Health

## 2018-08-07 NOTE — Progress Notes (Signed)
MEDICARE ANNUAL WELLNESS VISIT AND FOLLOW UP  Assessment:    Diagnoses and all orders for this visit:  Encounter for Medicare annual wellness exam  Hypertension, benign essential, goal below 140/90 Continue medication Monitor blood pressure at home; call if consistently over 130/80 Continue DASH diet.   Reminder to go to the ER if any CP, SOB, nausea, dizziness, severe HA, changes vision/speech, left arm numbness and tingling and jaw pain.  Fatty liver (2005 by U/S) Weight loss advised, avoid alcohol/tylenol, will monitor LFTs  Gout of big toe Continue allopurinol Diet discussed Check uric acid as needed  Prediabetes Discussed disease and risks Discussed diet/exercise, weight management  A1C  Mixed hyperlipidemia Continue medications Continue low cholesterol diet and exercise.  Check lipid panel.   Overweight (BMI 25.0-29.9) Long discussion about weight loss, diet, and exercise Discussed final goal weight and current weight loss goal (160lb) Patient will work on daily journaling, weigh weekly, have a goal Patient on phentermine with benefit and no SE, taking drug breaks; continue close follow up. Return in 3 months.   Anxiety Stress management techniques discussed, increase water, good sleep hygiene discussed, increase exercise, and increase veggies.   Environmental allergies Continue OTC allergy pills  History of Bell's palsy Resolved, followed by Dr. Jannifer Franklin   Over 30 minutes of exam, counseling, chart review, and critical decision making was performed  Future Appointments  Date Time Provider [place]  11/11/2018 10:00 AM Unk Pinto, MD [facility] None    Plan:   During the course of the visit the patient was educated and counseled about appropriate screening and preventive services including:    Pneumococcal vaccine   Influenza vaccine  Td vaccine  Prevnar 13  Screening electrocardiogram  Screening mammography  Bone  densitometry screening  Colorectal cancer screening  Diabetes screening  Glaucoma screening  Nutrition counseling   Advanced directives: given info/requested copies   Subjective:   [name] is a 67 y.o. female who presents for annual medicare visit and follow on on chronic medical conditions.    She is followed by ortho Dr. Amalia Hailey for her feet, seeing Lamar Sprinkles, PA at [facility] for injections.   she is prescribed phentermine for weight loss, 1/2 tab daily. While on the medication they have lost 0 lbs since last visit. They deny palpitations, anxiety, trouble sleeping, elevated BP.   BMI is Body mass index is 28.19 kg/m., she is working on diet and exercise. Wt Readings from Last 3 Encounters:  08/12/18 180 lb (81.6 kg)  03/27/18 173 lb 9.6 oz (78.7 kg)  09/20/17 175 lb 12.8 oz (79.7 kg)   Her blood pressure has been controlled at home, today their BP is BP: 118/70 She does workout. She denies chest pain, shortness of breath, dizziness.   She is on cholesterol medication (pravastatin 20 mg three days a week, fenofibrate) and denies myalgias. Her cholesterol is not at goal. The cholesterol last visit was:   Lab Results  Component Value Date   CHOL 203 (H) 03/27/2018   HDL 49 (L) 03/27/2018   LDLCALC 111 (H) 03/27/2018   TRIG 322 (H) 03/27/2018   CHOLHDL 4.1 03/27/2018   She has been working on diet and exercise for prediabetes, and denies foot ulcerations, hyperglycemia, hypoglycemia , increased appetite, nausea, paresthesia of the feet, polydipsia, polyuria, visual disturbances, vomiting and weight loss. Last A1C in the office was:  Lab Results  Component Value Date   HGBA1C 5.8 (H) 03/27/2018     Last GFR  Lab Results  Component Value Date   GFRNONAA 71 03/27/2018   Patient is on Vitamin D supplement. Lab Results  Component Value Date   VD25OH 50 03/27/2018     Patient is on allopurinol for gout and does not report a recent flare.  Lab Results   Component Value Date   LABURIC 6.0 03/27/2018    Medication Review Current Outpatient Medications on File Prior to Visit  Medication Sig Dispense Refill  . allopurinol (ZYLOPRIM) 300 MG tablet Take 1 tablet daily to prevent Gout 90 tablet 1  . aspirin EC 81 MG tablet Take 81 mg by mouth daily.    . Biotin 10 MG TABS Take by mouth daily.    . calcium carbonate (OSCAL) 1500 (600 Ca) MG TABS tablet Take by mouth daily with breakfast.    . Cholecalciferol (VITAMIN D PO) Take 10,000 Units by mouth daily.    . Cyanocobalamin (VITAMIN B-12 PO) Take 1 tablet by mouth daily.    . fenofibrate (TRICOR) 145 MG tablet Take 1 tablet daily for Cholesterol & Blood Fata 90 tablet 3  . phentermine (ADIPEX-P) 37.5 MG tablet Take 1 tablet (37.5 mg total) by mouth daily before breakfast. 30 tablet 5  . pravastatin (PRAVACHOL) 20 MG tablet Take 1/2 to 1 tablet at night for Cholesterol 90 tablet 1  . pyridOXINE (VITAMIN B-6) 100 MG tablet Take 100 mg by mouth daily.    Marland Kitchen triamterene-hydrochlorothiazide (MAXZIDE-25) 37.5-25 MG tablet TAKE 1/2 TO 1 TABLET BY MOUTH DAILY FOR BLOOD PRESSURE AND FLUID 90 tablet 0   Current Facility-Administered Medications on File Prior to Visit  Medication Dose Route Frequency Provider Last Rate Last Dose  . betamethasone acetate-betamethasone sodium phosphate (CELESTONE) injection 3 mg  3 mg Intramuscular Once Edrick Kins, DPM        Allergies: No Active Allergies  Current Problems (verified) has Hypertension, benign essential, goal below 140/90; Hyperlipidemia; Environmental allergies; Anxiety; History of Bell's palsy; Prediabetes; Overweight (BMI 25.0-29.9); Gout of big toe; Carpal tunnel syndrome on both sides; Lateral epicondylitis of left elbow; and Fatty liver (2005 by U/S) on their problem list.  Screening Tests Immunization History  Administered Date(s) Administered  . PPD Test 05/14/2014  . Pneumococcal Polysaccharide-23 02/08/2017    Preventative  care: Last colonoscopy: Mar 27 2016, Dr. Watt Climes Last mammogram:  11/18, has scheduled Last pap smear/pelvic exam: Hysterctomy, 2014 DEXA: declines this year  Prior vaccinations: TD or Tdap: 2011  Influenza: Declined, reaction  Pneumococcal: Done Prevnar13: Due next year Shingles/Zostavax: 2014  Names of Other Physician/Practitioners you currently use: 1. [facility]- here for primary care 2. [facility], eye doctor, last visit 10/2018 3. Dr. Illene Bolus, dentist, last visit 2018, has scheduled 09/2018  Patient Care Team: Unk Pinto, MD as PCP - General (Internal Medicine) Avon Gully, NP as Nurse Practitioner (Obstetrics and Gynecology) Clarene Essex, MD as Consulting Physician (Gastroenterology) [place], Laurice Record, MD (Inactive) as Consulting Physician (Orthopedic Surgery) Suella Broad, MD as Consulting Physician (Physical Medicine and Rehabilitation) Jari Pigg, MD as Consulting Physician (Dermatology)  Surgical: She  has a past surgical history that includes Abdominal hysterectomy (1990); Tonsillectomy; Tendon repair; and basal cell carcinoma (2011). Family Her family history includes Arthritis in her mother; Cancer in her brother and father; Heart attack in her mother; Heart disease (age of onset: 82) in her mother; Hyperlipidemia in her mother; Hypertension in her brother and mother; Parkinson's disease in her brother; Stroke in her mother. Social history  She reports that she has never smoked. She has never used smokeless tobacco. Her alcohol and drug histories are not on file.  MEDICARE WELLNESS OBJECTIVES: Physical activity:   Cardiac risk factors:   Depression/mood screen:   Depression screen [facility] 2/9 08/12/2018  Decreased Interest 0  Down, Depressed, Hopeless 0  PHQ - 2 Score 0    ADLs:  In your present state of health, do you have any difficulty performing the following activities: 03/30/2018 09/23/2017   Hearing? N N  Vision? N N  Difficulty concentrating or making decisions? N N  Walking or climbing stairs? N N  Dressing or bathing? N N  Doing errands, shopping? N N  Some recent data might be hidden     Cognitive Testing  Alert? Yes  Normal Appearance?Yes  Oriented to person? Yes  Place? Yes   Time? Yes  Recall of three objects?  Yes  Can perform simple calculations? Yes  Displays appropriate judgment?Yes  Can read the correct time from a watch face?Yes  EOL planning: Does Patient Have a Medical Advance Directive?: Yes Type of Advance Directive: Healthcare Power of Attorney, Living will Does patient want to make changes to medical advance directive?: No - Patient declined Copy of [place] in Chart?: No - copy requested   Objective:   Today's Vitals   08/12/18 0850  BP: 118/70  Pulse: 79  Temp: 97.7 F (36.5 C)  SpO2: 97%  Weight: 180 lb (81.6 kg)  Height: 5\' 7"  (1.702 m)   Wt Readings from Last 3 Encounters:  08/12/18 180 lb (81.6 kg)  03/27/18 173 lb 9.6 oz (78.7 kg)  09/20/17 175 lb 12.8 oz (79.7 kg)    Body mass index is 28.19 kg/m.  General appearance: alert, no distress, WD/WN,  female HEENT: normocephalic, sclerae anicteric, TMs pearly, nares patent, no discharge or erythema, pharynx normal Oral cavity: MMM, no lesions Neck: supple, no lymphadenopathy, no thyromegaly, no masses Heart: RRR, normal S1, S2, no murmurs Lungs: CTA bilaterally, no wheezes, rhonchi, or rales Abdomen: +bs, soft, non tender, non distended, no masses, no hepatomegaly, no splenomegaly Musculoskeletal: nontender, no swelling, no obvious deformity Extremities: no edema, no cyanosis, no clubbing Pulses: 2+ symmetric, upper and lower extremities, normal cap refill Neurological: alert, oriented x 3, CN2-12 intact, strength normal upper extremities and lower extremities, sensation normal throughout, DTRs 2+ throughout, no cerebellar signs, gait normal Psychiatric:  normal affect, behavior normal, pleasant  Breast: defer Gyn: defer Rectal: defer   Medicare Attestation I have personally reviewed: The patient's medical and social history Their use of alcohol, tobacco or illicit drugs Their current medications and supplements The patient's functional ability including ADLs,fall risks, home safety risks, cognitive, and hearing and visual impairment Diet and physical activities Evidence for depression or mood disorders  The patient's weight, height, BMI, and visual acuity have been recorded in the chart.  I have made referrals, counseling, and provided education to the patient based on review of the above and I have provided the patient with a written personalized care plan for preventive services.     Izora Ribas, NP   08/12/2018

## 2018-08-12 ENCOUNTER — Ambulatory Visit (INDEPENDENT_AMBULATORY_CARE_PROVIDER_SITE_OTHER): Payer: Medicare Other | Admitting: Adult Health

## 2018-08-12 ENCOUNTER — Encounter: Payer: Self-pay | Admitting: Adult Health

## 2018-08-12 ENCOUNTER — Other Ambulatory Visit: Payer: Self-pay | Admitting: Adult Health

## 2018-08-12 VITALS — BP 118/70 | HR 79 | Temp 97.7°F | Ht 67.0 in | Wt 180.0 lb

## 2018-08-12 DIAGNOSIS — K760 Fatty (change of) liver, not elsewhere classified: Secondary | ICD-10-CM

## 2018-08-12 DIAGNOSIS — R6889 Other general symptoms and signs: Secondary | ICD-10-CM

## 2018-08-12 DIAGNOSIS — I10 Essential (primary) hypertension: Secondary | ICD-10-CM

## 2018-08-12 DIAGNOSIS — Z0001 Encounter for general adult medical examination with abnormal findings: Secondary | ICD-10-CM | POA: Diagnosis not present

## 2018-08-12 DIAGNOSIS — Z0000 Encounter for general adult medical examination without abnormal findings: Secondary | ICD-10-CM

## 2018-08-12 DIAGNOSIS — E782 Mixed hyperlipidemia: Secondary | ICD-10-CM

## 2018-08-12 DIAGNOSIS — Z9109 Other allergy status, other than to drugs and biological substances: Secondary | ICD-10-CM | POA: Diagnosis not present

## 2018-08-12 DIAGNOSIS — Z79899 Other long term (current) drug therapy: Secondary | ICD-10-CM

## 2018-08-12 DIAGNOSIS — R7303 Prediabetes: Secondary | ICD-10-CM | POA: Diagnosis not present

## 2018-08-12 DIAGNOSIS — E663 Overweight: Secondary | ICD-10-CM | POA: Diagnosis not present

## 2018-08-12 DIAGNOSIS — M109 Gout, unspecified: Secondary | ICD-10-CM

## 2018-08-12 DIAGNOSIS — F419 Anxiety disorder, unspecified: Secondary | ICD-10-CM

## 2018-08-12 DIAGNOSIS — Z8669 Personal history of other diseases of the nervous system and sense organs: Secondary | ICD-10-CM

## 2018-08-12 MED ORDER — MELOXICAM 15 MG PO TABS: ORAL_TABLET | ORAL | 1 refills | Status: DC

## 2018-08-12 NOTE — Patient Instructions (Addendum)
[name] , Thank you for taking time to come for your Medicare Wellness Visit. I appreciate your ongoing commitment to your health goals. Please review the following plan we discussed and let me know if I can assist you in the future.   These are the goals we discussed: Goals   None     This is a list of the screening recommended for you and due dates:  Health Maintenance  Topic Date Due  . DEXA scan (bone density measurement)  08/13/2019*  . Mammogram  10/17/2019  . Tetanus Vaccine  11/28/2019  . Colon Cancer Screening  04/27/2020  . Pneumonia vaccines  Completed  *Topic was postponed. The date shown is not the original due date.    Aim to get 1/2 your body weight in fluid ounces of water or unsweet tea daily Keep a regular schedule Look into water aerobics - 150 min/week Weight bearing exercise regularly for bone health Keep a nightly journal Weigh weekly   Drink 1/2 your body weight in fluid ounces of water daily; drink a tall glass of water 30 min before meals  Don't eat until you're stuffed- listen to your stomach and eat until you are 80% full   Try eating off of a salad plate; wait 10 min after finishing before going back for seconds  Start by eating the vegetables on your plate; aim for 50% of your meals to be fruits or vegetables  Then eat your protein - lean meats (grass fed if possible), fish, beans, nuts in moderation  Eat your carbs/starch last ONLY if you still are hungry. If you can, stop before finishing it all  Avoid sugar and flour - the closer it looks to it's original form in nature, typically the better it is for you  Splurge in moderation - "assign" days when you get to splurge and have the "bad stuff" - I like to follow a 80% - 20% plan- "good" choices 80 % of the time, "bad" choices in moderation 20% of the time  Simple equation is: Calories out > calories in = weight loss - even if you eat the bad stuff, if you limit portions, you will still  lose weight          When it comes to diets, agreement about the perfect plan isn't easy to find, even among the experts. Experts at the [employer] developed an idea known as the Healthy Eating Plate. Just imagine a plate divided into logical, healthy portions.  The emphasis is on diet quality:  Load up on vegetables and fruits - one-half of your plate: Aim for color and variety, and remember that potatoes don't count.  Go for whole grains - one-quarter of your plate: Whole wheat, barley, wheat berries, quinoa, oats, brown rice, and foods made with them. If you want pasta, go with whole wheat pasta.  Protein power - one-quarter of your plate: Fish, chicken, beans, and nuts are all healthy, versatile protein sources. Limit red meat.  The diet, however, does go beyond the plate, offering a few other suggestions.  Use healthy plant oils, such as olive, canola, soy, corn, sunflower and peanut. Check the labels, and avoid partially hydrogenated oil, which have unhealthy trans fats.  If you're thirsty, drink water. Coffee and tea are good in moderation, but skip sugary drinks and limit milk and dairy products to one or two daily servings.  The type of carbohydrate in the diet is more important than  the amount. Some sources of carbohydrates, such as vegetables, fruits, whole grains, and beans-are healthier than others.  Finally, stay active.

## 2018-08-13 ENCOUNTER — Other Ambulatory Visit: Payer: Self-pay | Admitting: Adult Health

## 2018-08-13 DIAGNOSIS — N183 Chronic kidney disease, stage 3 unspecified: Secondary | ICD-10-CM

## 2018-08-13 LAB — COMPLETE METABOLIC PANEL WITH GFR
AG Ratio: 2.2 (calc) (ref 1.0–2.5)
ALKALINE PHOSPHATASE (APISO): 56 U/L (ref 33–130)
ALT: 28 U/L (ref 6–29)
AST: 25 U/L (ref 10–35)
Albumin: 5 g/dL (ref 3.6–5.1)
BUN/Creatinine Ratio: 25 (calc) — ABNORMAL HIGH (ref 6–22)
BUN: 27 mg/dL — ABNORMAL HIGH (ref 7–25)
CO2: 32 mmol/L (ref 20–32)
CREATININE: 1.06 mg/dL — AB (ref 0.50–0.99)
Calcium: 10.2 mg/dL (ref 8.6–10.4)
Chloride: 102 mmol/L (ref 98–110)
GFR, Est African American: 63 mL/min/{1.73_m2} (ref >=60)
GFR, Est Non African American: 54 mL/min/{1.73_m2} — ABNORMAL LOW (ref >=60)
GLOBULIN: 2.3 g/dL (ref 1.9–3.7)
Glucose, Bld: 102 mg/dL — ABNORMAL HIGH (ref 65–99)
POTASSIUM: 4 mmol/L (ref 3.5–5.3)
SODIUM: 142 mmol/L (ref 135–146)
Total Bilirubin: 0.6 mg/dL (ref 0.2–1.2)
Total Protein: 7.3 g/dL (ref 6.1–8.1)

## 2018-08-13 LAB — CBC WITH DIFFERENTIAL/PLATELET
Basophils Absolute: 57 cells/uL (ref 0–200)
Basophils Relative: 1.1 %
Eosinophils Absolute: 140 cells/uL (ref 15–500)
Eosinophils Relative: 2.7 %
HCT: 39.5 % (ref 35.0–45.0)
HEMOGLOBIN: 14.1 g/dL (ref 11.7–15.5)
Lymphs Abs: 1919 cells/uL (ref 850–3900)
MCH: 31.4 pg (ref 27.0–33.0)
MCHC: 35.7 g/dL (ref 32.0–36.0)
MCV: 88 fL (ref 80.0–100.0)
MONOS PCT: 7.5 %
MPV: 9.2 fL (ref 7.5–12.5)
NEUTROS ABS: 2694 {cells}/uL (ref 1500–7800)
NEUTROS PCT: 51.8 %
Platelets: 217 10*3/uL (ref 140–400)
RBC: 4.49 10*6/uL (ref 3.80–5.10)
RDW: 12.6 % (ref 11.0–15.0)
Total Lymphocyte: 36.9 %
WBC mixed population: 390 cells/uL (ref 200–950)
WBC: 5.2 10*3/uL (ref 3.8–10.8)

## 2018-08-13 LAB — LIPID PANEL
CHOLESTEROL: 158 mg/dL (ref <200)
HDL: 42 mg/dL — ABNORMAL LOW (ref >50)
LDL Cholesterol (Calc): 89 mg/dL (calc)
Non-HDL Cholesterol (Calc): 116 mg/dL (calc) (ref <130)
Total CHOL/HDL Ratio: 3.8 (calc) (ref <5.0)
Triglycerides: 174 mg/dL — ABNORMAL HIGH (ref <150)

## 2018-08-13 LAB — TSH: TSH: 0.9 mIU/L (ref 0.40–4.50)

## 2018-08-13 LAB — HEMOGLOBIN A1C
EAG (MMOL/L): 6.5 (calc)
Hgb A1c MFr Bld: 5.7 % of total Hgb — ABNORMAL HIGH (ref <5.7)
MEAN PLASMA GLUCOSE: 117 (calc)

## 2018-08-16 DIAGNOSIS — M5416 Radiculopathy, lumbar region: Secondary | ICD-10-CM | POA: Diagnosis not present

## 2018-08-16 DIAGNOSIS — M7062 Trochanteric bursitis, left hip: Secondary | ICD-10-CM | POA: Diagnosis not present

## 2018-08-30 ENCOUNTER — Encounter: Payer: Self-pay | Admitting: Podiatry

## 2018-08-30 ENCOUNTER — Encounter

## 2018-08-30 ENCOUNTER — Ambulatory Visit (INDEPENDENT_AMBULATORY_CARE_PROVIDER_SITE_OTHER): Payer: Medicare Other | Admitting: Podiatry

## 2018-08-30 DIAGNOSIS — L989 Disorder of the skin and subcutaneous tissue, unspecified: Secondary | ICD-10-CM

## 2018-08-30 DIAGNOSIS — M7751 Other enthesopathy of right foot: Secondary | ICD-10-CM

## 2018-09-01 NOTE — Progress Notes (Signed)
   HPI: 67 year old female presenting today for follow up evaluation of capsulitis of the 2nd MPJ capsulitis of the right foot. She reports continued pain of the foot and also notes a painful callus lesion to the area. She states the injection she previously received helped alleviate the pain temporarily. Walking and wearing shoes increases the pain. She has not done anything for treatment at home. Patient is here for further evaluation and treatment.   Past Medical History:  Diagnosis Date  . Allergy   . Anxiety   . Bell palsy 02/04/2016   Left  . Cancer ([place]) 2011   BCC  . Hyperlipidemia   . Hypertension 2005  . Plantar fasciitis      Physical Exam: General: The patient is alert and oriented x3 in no acute distress.  Dermatology: Hyperkeratotic lesion present on the sub-second MPJ of the right foot. Pain on palpation with a central nucleated core noted. Skin is warm, dry and supple bilateral lower extremities. Negative for open lesions or macerations.  Vascular: Palpable pedal pulses bilaterally. No edema or erythema noted. Capillary refill within normal limits.  Neurological: Epicritic and protective threshold grossly intact bilaterally.   Musculoskeletal Exam: Pain with palpation to the 2nd MPJ of the right foot. Range of motion within normal limits to all pedal and ankle joints bilateral. Muscle strength 5/5 in all groups bilateral.   Assessment: 1. 2nd MPJ capsulitis right  2. 3rd MPJ capsulitis right  3. Callus lesion noted to the right sub-second MPJ   Plan of Care:  1. Patient evaluated.   2. Injection of 0.5 mLs Celestone Soluspan injected into the 2nd MPJ of the right foot.  3. Injection of 0.5 mLs Celestone Soluspan injected into the 3rd MPJ of the right foot.  4. Excisional debridement of keratoic lesion using a chisel blade was performed without incident. Light dressing applied.  5. Return to clinic in 8 weeks.      Edrick Kins, DPM [facility]  Dr. Edrick Kins, DPM    [address], [place] [address]                Office [phone]  Fax [phone]

## 2018-09-02 ENCOUNTER — Other Ambulatory Visit: Payer: Medicare Other

## 2018-09-02 DIAGNOSIS — N183 Chronic kidney disease, stage 3 unspecified: Secondary | ICD-10-CM

## 2018-09-03 LAB — BASIC METABOLIC PANEL WITH GFR
BUN: 23 mg/dL (ref 7–25)
CO2: 34 mmol/L — ABNORMAL HIGH (ref 20–32)
Calcium: 9.7 mg/dL (ref 8.6–10.4)
Chloride: 97 mmol/L — ABNORMAL LOW (ref 98–110)
Creat: 0.9 mg/dL (ref 0.50–0.99)
GFR, EST AFRICAN AMERICAN: 77 mL/min/{1.73_m2} (ref >=60)
GFR, EST NON AFRICAN AMERICAN: 66 mL/min/{1.73_m2} (ref >=60)
Glucose, Bld: 81 mg/dL (ref 65–99)
POTASSIUM: 3.8 mmol/L (ref 3.5–5.3)
SODIUM: 136 mmol/L (ref 135–146)

## 2018-09-12 ENCOUNTER — Other Ambulatory Visit: Payer: Self-pay | Admitting: Adult Health

## 2018-09-17 ENCOUNTER — Ambulatory Visit (INDEPENDENT_AMBULATORY_CARE_PROVIDER_SITE_OTHER): Payer: Medicare Other | Admitting: Podiatry

## 2018-09-17 ENCOUNTER — Ambulatory Visit (INDEPENDENT_AMBULATORY_CARE_PROVIDER_SITE_OTHER): Payer: Medicare Other

## 2018-09-17 DIAGNOSIS — M7751 Other enthesopathy of right foot: Secondary | ICD-10-CM | POA: Diagnosis not present

## 2018-09-17 DIAGNOSIS — D1720 Benign lipomatous neoplasm of skin and subcutaneous tissue of unspecified limb: Secondary | ICD-10-CM

## 2018-09-17 DIAGNOSIS — M10071 Idiopathic gout, right ankle and foot: Secondary | ICD-10-CM

## 2018-09-17 MED ORDER — METHYLPREDNISOLONE 4 MG PO TBPK: ORAL_TABLET | ORAL | 0 refills | Status: DC

## 2018-09-17 NOTE — Progress Notes (Signed)
   HPI: 67 year old female well-known to the practice presents to the office today for new complaint regarding right ankle pain.  Patient says that she has a history of gout and she currently takes allopurinol daily for gout prevention as prescribed by her primary care physician.  Patient says that she woke up one morning with a flareup of gout and swelling to the lateral aspect of her right ankle.  Patient has increased pain to the area.  This is been present for approximately 1 week.  She has been taking Tylenol for the pain.  She presents for further treatment evaluation  Past Medical History:  Diagnosis Date  . Allergy   . Anxiety   . Bell palsy 02/04/2016   Left  . Cancer ([place]) 2011   BCC  . Hyperlipidemia   . Hypertension 2005  . Plantar fasciitis         Physical Exam: General: The patient is alert and oriented x3 in no acute distress.  Dermatology: Skin is warm, dry and supple bilateral lower extremities. Negative for open lesions or macerations.  Vascular: Palpable pedal pulses bilaterally. No edema or erythema noted. Capillary refill within normal limits.  Neurological: Epicritic and protective threshold grossly intact bilaterally.   Musculoskeletal Exam: Range of motion within normal limits to all pedal and ankle joints bilateral. Muscle strength 5/5 in all groups bilateral.  Pain on palpation the lateral aspect of the right ankle with some erythema and edema.  There is also non-adhered lipoma to the anterior lateral aspect of the right ankle joint possibly increased in size secondary to the gout flareup.  There is also a palpable non-adhered likely lipoma/soft tissue mass to the posterior lateral aspect of the right ankle. (please see attached clinical photo)  Radiographic Exam:  Normal osseous mineralization. Joint spaces preserved. No fracture/dislocation/boney destruction.    Assessment: 1.  Acute gout right ankle 2.  Lipoma anterior lateral right ankle and posterior  lateral right ankle   Plan of Care:  1. Patient evaluated. X-Rays reviewed.  2.  Injection of 0.5 cc Celestone Soluspan injection for the right ankle joint lateral aspect 3.  Compression anklet dispensed as needed swelling 4.  Prescription for Medrol Dosepak.  Patient cannot take colchicine she says that it caused major side effects last time she took it 5.  Continue Tylenol as needed 6.  Return to clinic in 4 weeks.  At this time we may proceed with surgical consult regarding surgical excision of the multiple lipomas to the right ankle.  Today we did discuss surgery and the patient would like to have the lipomas removed since there very symptomatic and painful, even without the acute gout flareup.        Edrick Kins, DPM [facility]  Dr. Edrick Kins, DPM    [address], [place] [address]                Office [phone]  Fax [phone]

## 2018-09-18 ENCOUNTER — Other Ambulatory Visit: Payer: Self-pay | Admitting: Internal Medicine

## 2018-09-18 DIAGNOSIS — M109 Gout, unspecified: Secondary | ICD-10-CM

## 2018-09-20 DIAGNOSIS — Z23 Encounter for immunization: Secondary | ICD-10-CM | POA: Diagnosis not present

## 2018-09-22 ENCOUNTER — Other Ambulatory Visit: Payer: Self-pay | Admitting: Internal Medicine

## 2018-10-15 ENCOUNTER — Ambulatory Visit (INDEPENDENT_AMBULATORY_CARE_PROVIDER_SITE_OTHER): Payer: Medicare Other | Admitting: Podiatry

## 2018-10-15 ENCOUNTER — Encounter: Payer: Self-pay | Admitting: Podiatry

## 2018-10-15 DIAGNOSIS — M79671 Pain in right foot: Secondary | ICD-10-CM | POA: Diagnosis not present

## 2018-10-15 DIAGNOSIS — M7751 Other enthesopathy of right foot: Secondary | ICD-10-CM

## 2018-10-15 DIAGNOSIS — D2371 Other benign neoplasm of skin of right lower limb, including hip: Secondary | ICD-10-CM

## 2018-10-15 DIAGNOSIS — D1720 Benign lipomatous neoplasm of skin and subcutaneous tissue of unspecified limb: Secondary | ICD-10-CM

## 2018-10-15 DIAGNOSIS — L989 Disorder of the skin and subcutaneous tissue, unspecified: Secondary | ICD-10-CM

## 2018-10-15 DIAGNOSIS — D499 Neoplasm of unspecified behavior of unspecified site: Secondary | ICD-10-CM | POA: Diagnosis not present

## 2018-10-15 DIAGNOSIS — D2370 Other benign neoplasm of skin of unspecified lower limb, including hip: Secondary | ICD-10-CM | POA: Diagnosis not present

## 2018-10-15 NOTE — Patient Instructions (Signed)
Pre-Operative Instructions  Congratulations, you have decided to take an important step towards improving your quality of life.  You can be assured that the doctors and staff at [facility] will be with you every step of the way.  Here are some important things you should know:  1. Plan to be at the surgery center/hospital at least 1 (one) hour prior to your scheduled time, unless otherwise directed by the surgical center/hospital staff.  You must have a responsible adult accompany you, remain during the surgery and drive you home.  Make sure you have directions to the surgical center/hospital to ensure you arrive on time. 2. If you are having surgery at [facility] or [facility], you will need a copy of your medical history and physical form from your family physician within one month prior to the date of surgery. We will give you a form for your primary physician to complete.  3. We make every effort to accommodate the date you request for surgery.  However, there are times where surgery dates or times have to be moved.  We will contact you as soon as possible if a change in schedule is required.   4. No aspirin/ibuprofen for one week before surgery.  If you are on aspirin, any non-steroidal anti-inflammatory medications (Mobic, Aleve, Ibuprofen) should not be taken seven (7) days prior to your surgery.  You make take Tylenol for pain prior to surgery.  5. Medications - If you are taking daily heart and blood pressure medications, seizure, reflux, allergy, asthma, anxiety, pain or diabetes medications, make sure you notify the surgery center/hospital before the day of surgery so they can tell you which medications you should take or avoid the day of surgery. 6. No food or drink after midnight the night before surgery unless directed otherwise by surgical center/hospital staff. 7. No alcoholic beverages 24-hours prior to surgery.  No smoking 24-hours prior or 24-hours after  surgery. 8. Wear loose pants or shorts. They should be loose enough to fit over bandages, boots, and casts. 9. Don't wear slip-on shoes. Sneakers are preferred. 10. Bring your boot with you to the surgery center/hospital.  Also bring crutches or a walker if your physician has prescribed it for you.  If you do not have this equipment, it will be provided for you after surgery. 11. If you have not been contacted by the surgery center/hospital by the day before your surgery, call to confirm the date and time of your surgery. 12. Leave-time from work may vary depending on the type of surgery you have.  Appropriate arrangements should be made prior to surgery with your employer. 13. Prescriptions will be provided immediately following surgery by your doctor.  Fill these as soon as possible after surgery and take the medication as directed. Pain medications will not be refilled on weekends and must be approved by the doctor. 14. Remove nail polish on the operative foot and avoid getting pedicures prior to surgery. 15. Wash the night before surgery.  The night before surgery wash the foot and leg well with water and the antibacterial soap provided. Be sure to pay special attention to beneath the toenails and in between the toes.  Wash for at least three (3) minutes. Rinse thoroughly with water and dry well with a towel.  Perform this wash unless told not to do so by your physician.  Enclosed: 1 Ice pack (please put in freezer the night before surgery)   1 Hibiclens skin cleaner     Pre-op instructions  If you have any questions regarding the instructions, please do not hesitate to call our office.  [employer]: [address], [place], [place] [address] -- [phone]  [place]: [address] [place], [place] [address] -- [phone]  [place]: [address]  [place], [place] [address] -- [phone]  [place]: [address], [address], [place],  [address] -- [phone]  Website:  [email or web address] 

## 2018-10-16 NOTE — Progress Notes (Signed)
   Subjective: 67 year old female presenting today for follow up evaluation of right ankle pain. She states the ankle pain has resolved but is now experiencing pain to the plantar aspect of the right forefoot secondary to a callus lesion that has been present for several weeks. Walking increases the pain. She has not done anything for treatment. Patient is here for further evaluation and treatment.   Past Medical History:  Diagnosis Date  . Allergy   . Anxiety   . Bell palsy 02/04/2016   Left  . Cancer ([place]) 2011   BCC  . Hyperlipidemia   . Hypertension 2005  . Plantar fasciitis      Objective:  Physical Exam General: Alert and oriented x3 in no acute distress  Dermatology: Hyperkeratotic lesion present on the sub-second MPJ of the right foot. Pain on palpation with a central nucleated core noted. Skin is warm, dry and supple bilateral lower extremities. Negative for open lesions or macerations.  Vascular: Palpable pedal pulses bilaterally. No edema or erythema noted. Capillary refill within normal limits.  Neurological: Epicritic and protective threshold grossly intact bilaterally.   Musculoskeletal Exam: Non-adhered lipoma to the anterior lateral aspect of the right ankle joint possibly increased in size secondary to the gout flareup. There is also a palpable non-adhered likely lipoma/soft tissue mass to the posterior lateral aspect of the right ankle. Pain on palpation at the keratotic lesion noted as well as to the 2nd MPJ of the right foot. Range of motion within normal limits bilateral. Muscle strength 5/5 in all groups bilateral.  Assessment: 1. Porokeratosis noted to the right sub-second MPJ 2. 2nd MPJ capsulitis right 3. Right ankle lipoma    Plan of Care:  1. Patient evaluated 2. Excisional debridement of keratoic lesion using a chisel blade was performed without incident. Salinocaine applied to area.  3. Dressed area with light dressing. 4. Injection of 0.5 mLs  Celestone Soluspan injected into the 2nd MPJ of the right foot.  5. Today we discussed the conservative versus surgical management of the presenting pathology. The patient opts for surgical management. All possible complications and details of the procedure were explained. All patient questions were answered. No guarantees were expressed or implied. 6. Authorization for surgery was initiated today. Surgery will consist of excision of soft tissue lipoma right ankle.  7. Return to clinic one week post op.      Edrick Kins, DPM [facility]  Dr. Edrick Kins, [address]                                        [place], [place] [address]                Office [phone]  Fax [phone]

## 2018-10-17 ENCOUNTER — Telehealth: Payer: Self-pay | Admitting: *Deleted

## 2018-10-17 NOTE — Telephone Encounter (Signed)
"  I'm a patient of Dr. Amalia Hailey.  I saw him on Tuesday.  We discussed having some outpatient surgery on my right foot.  I think he sent the authorization over to you to be cleared by Medicare.  I want to see if I can schedule that, if they approve it, on Thursday, January 9 would be my first date of choice and the 16th would be my next one.  If you could let me know if that date is available as soon as you can I'd appreciate it.  If you could give me a call back and let me know if that's available and if everything is on go.  I'd appreciate it.  No rush with the Holidays coming up but I would like to know at your earliest convenience.  Thanks so much for your help."

## 2018-10-18 DIAGNOSIS — Z1231 Encounter for screening mammogram for malignant neoplasm of breast: Secondary | ICD-10-CM | POA: Diagnosis not present

## 2018-10-18 LAB — HM MAMMOGRAPHY

## 2018-10-18 NOTE — Telephone Encounter (Signed)
I am returning your call.  Dr. Amalia Hailey can do your surgery on January 16.  "Okay, go ahead and put me down for that day.  You do not have anything the week before, correct?"  That is correct, that date is full.  "I had told him before I wasn't sure if I wanted to have the surgery or not.  So, I may call and cancel it.  How soon should I call to cancel it, a week before?"  I'd prefer you let me know sooner than that because several people try to get scheduled for surgery before their deductible starts over.  "Okay, I'll let you know."

## 2018-10-22 ENCOUNTER — Ambulatory Visit: Payer: Medicare Other | Admitting: Podiatry

## 2018-10-28 ENCOUNTER — Encounter: Payer: Self-pay | Admitting: *Deleted

## 2018-11-08 DIAGNOSIS — M545 Low back pain: Secondary | ICD-10-CM | POA: Diagnosis not present

## 2018-11-08 DIAGNOSIS — M5416 Radiculopathy, lumbar region: Secondary | ICD-10-CM | POA: Diagnosis not present

## 2018-11-08 DIAGNOSIS — M7062 Trochanteric bursitis, left hip: Secondary | ICD-10-CM | POA: Diagnosis not present

## 2018-11-08 DIAGNOSIS — G8929 Other chronic pain: Secondary | ICD-10-CM | POA: Diagnosis not present

## 2018-11-10 ENCOUNTER — Encounter: Payer: Self-pay | Admitting: Internal Medicine

## 2018-11-10 NOTE — Patient Instructions (Addendum)

## 2018-11-10 NOTE — Progress Notes (Signed)
[facility] Unk Pinto, M.D.     Uvaldo Bristle. Silverio Lay, P.A.-C Liane Comber, [facility] [address] [place] [address] Telephone [phone] Telefax [phone]  Comprehensive Evaluation &  Examination     This very nice 67 y.o. MWF  presents for a  comprehensive evaluation and management of multiple medical co-morbidities.  Patient has been followed for HTN, HLD, Prediabetes  and Vitamin D Deficiency. Patient has hx/o Gout controlled on Allopurinol.        HTN predates since 1998. Patient's BP has been controlled at home and patient denies any cardiac symptoms as chest pain, palpitations, shortness of breath, dizziness or ankle swelling. Today's BP is at goal - 134/82.      Patient's hyperlipidemia since 2005 is controlled with diet and medications. Patient denies myalgias or other medication SE's. Patient does have hx/o Fatty Liver by Abd U/S in 2005. Last lipids were at goal albeit elevated Trig's: Lab Results  Component Value Date   CHOL 158 08/12/2018   HDL 42 (L) 08/12/2018   LDLCALC 89 08/12/2018   TRIG 174 (H) 08/12/2018   CHOLHDL 3.8 08/12/2018      Patient has hx/o prediabetes  (A1c 5.9% / 2012 &5.7% / 2015) and patient denies reactive hypoglycemic symptoms, visual blurring, diabetic polys or paresthesias. Last A1c was near goal: Lab Results  Component Value Date   HGBA1C 5.7 (H) 08/12/2018      Finally, patient has history of Vitamin D Deficiency ("3" / Mar 2018) and last Vitamin D was still slightly low (goal 70-100): Lab Results  Component Value Date   VD25OH 50 03/27/2018   Current Outpatient Medications on File Prior to Visit  Medication Sig  . allopurinol (ZYLOPRIM) 300 MG tablet TAKE 1 TABLET BY MOUTH DAILY FOR GOUT PREVENTION  . Biotin 10 MG TABS Take by mouth daily.  . calcium carbonate (OSCAL) 1500 (600 Ca) MG TABS tablet Take by mouth daily with breakfast.  .  Cholecalciferol (VITAMIN D PO) Take 10,000 Units by mouth daily.  . Cyanocobalamin (VITAMIN B-12 PO) Take 1 tablet by mouth daily.  . fenofibrate (TRICOR) 145 MG tablet Take 1 tablet daily for Cholesterol & Blood Fata  . phentermine (ADIPEX-P) 37.5 MG tablet TAKE 1 TABLET(37.5 MG) BY MOUTH DAILY BEFORE BREAKFAST  . pyridOXINE (VITAMIN B-6) 100 MG tablet Take 100 mg by mouth daily.  Marland Kitchen triamterene-hydrochlorothiazide (MAXZIDE-25) 37.5-25 MG tablet TAKE 1/2 TO 1 TABLET BY MOUTH DAILY FOR BLOOD PRESSURE AND FLUID RETENTION  . pravastatin (PRAVACHOL) 20 MG tablet Take 1/2 to 1 tablet at night for Cholesterol   Current Facility-Administered Medications on File Prior to Visit  Medication  . betamethasone acetate-betamethasone sodium phosphate (CELESTONE) injection 3 mg   No Active Allergies Past Medical History:  Diagnosis Date  . Allergy   . Anxiety   . Bell palsy 02/04/2016   Left  . Cancer ([place]) 2011   BCC  . Hyperlipidemia   . Hypertension 2005  . Plantar fasciitis    Health Maintenance  Topic Date Due  . DEXA SCAN  08/13/2019 (Originally 12/16/2015)  . MAMMOGRAM  10/19/2019  . TETANUS/TDAP  11/28/2019  . COLONOSCOPY  04/27/2020  . PNA vac Low Risk Adult  Completed   Immunization History  Administered Date(s) Administered  . Influenza-Unspecified 10/02/2018  . PPD Test 05/14/2014  . Pneumococcal Polysaccharide-23 02/08/2017   - Patient had Colonoscopies in 2006 & 2011 w/ an adenomatous polyp ([place]).  - Last  Colon )03/27/2016 - Dr Watt Climes - recc 5 yr f/u due May 2022  - Last MGM - 10/18/2018  Past Surgical History:  Procedure Laterality Date  . ABDOMINAL HYSTERECTOMY  1990  . basal cell carcinoma  2011   nose  . TENDON REPAIR     plantar fasciitis  . TONSILLECTOMY     Family History  Problem Relation Age of Onset  . Hypertension Mother   . Heart attack Mother   . Hyperlipidemia Mother   . Heart disease Mother 57       fatal MI  . Stroke Mother   . Arthritis Mother         rheumatoid  . Hypertension Brother   . Cancer Brother        throat, leukemia  . Parkinson's disease Brother   . Cancer Father        lung   Social History   Tobacco Use  . Smoking status: Never Smoker  . Smokeless tobacco: Never Used  Substance Use Topics  . Alcohol use: Not on file  . Drug use: Not on file    ROS Constitutional: Denies fever, chills, weight loss/gain, headaches, insomnia,  night sweats, and change in appetite. Does c/o fatigue. Eyes: Denies redness, blurred vision, diplopia, discharge, itchy, watery eyes.  ENT: Denies discharge, congestion, post nasal drip, epistaxis, sore throat, earache, hearing loss, dental pain, Tinnitus, Vertigo, Sinus pain, snoring.  Cardio: Denies chest pain, palpitations, irregular heartbeat, syncope, dyspnea, diaphoresis, orthopnea, PND, claudication, edema Respiratory: denies cough, dyspnea, DOE, pleurisy, hoarseness, laryngitis, wheezing.  Gastrointestinal: Denies dysphagia, heartburn, reflux, water brash, pain, cramps, nausea, vomiting, bloating, diarrhea, constipation, hematemesis, melena, hematochezia, jaundice, hemorrhoids Genitourinary: Denies dysuria, frequency, urgency, nocturia, hesitancy, discharge, hematuria, flank pain Breast: Breast lumps, nipple discharge, bleeding.  Musculoskeletal: Denies arthralgia, myalgia, stiffness, Jt. Swelling, pain, limp, and strain/sprain. Denies falls. Skin: Denies puritis, rash, hives, warts, acne, eczema, changing in skin lesion Neuro: No weakness, tremor, incoordination, spasms, paresthesia, pain Psychiatric: Denies confusion, memory loss, sensory loss. Denies Depression. Endocrine: Denies change in weight, skin, hair change, nocturia, and paresthesia, diabetic polys, visual blurring, hyper / hypo glycemic episodes.  Heme/Lymph: No excessive bleeding, bruising, enlarged lymph nodes.  Physical Exam  BP 134/82   Pulse 80   Temp (!) 97.3 F (36.3 C)   Resp 16   Ht 5\' 7"  (1.702 m)    Wt 177 lb 3.2 oz (80.4 kg)   BMI 27.75 kg/m   General Appearance: Well nourished, well groomed and in no apparent distress.  Eyes: PERRLA, EOMs, conjunctiva no swelling or erythema, normal fundi and vessels. Sinuses: No frontal/maxillary tenderness ENT/Mouth: EACs patent / TMs  nl. Nares clear without erythema, swelling, mucoid exudates. Oral hygiene is good. No erythema, swelling, or exudate. Tongue normal, non-obstructing. Tonsils not swollen or erythematous. Hearing normal.  Neck: Supple, thyroid not palpable. No bruits, nodes or JVD. Respiratory: Respiratory effort normal.  BS equal and clear bilateral without rales, rhonci, wheezing or stridor. Cardio: Heart sounds are normal with regular rate and rhythm and no murmurs, rubs or gallops. Peripheral pulses are normal and equal bilaterally without edema. No aortic or femoral bruits. Chest: symmetric with normal excursions and percussion. Breasts: Symmetric, without lumps, nipple discharge, retractions, or fibrocystic changes.  Abdomen: Flat, soft with bowel sounds active. Nontender, no guarding, rebound, hernias, masses, or organomegaly.  Lymphatics: Non tender without lymphadenopathy.  Genitourinary:  Musculoskeletal: Full ROM all peripheral extremities, joint stability, 5/5 strength, and normal gait. Skin: Warm and dry without  rashes, lesions, cyanosis, clubbing or  ecchymosis.  Neuro: Cranial nerves intact, reflexes equal bilaterally. Normal muscle tone, no cerebellar symptoms. Sensation intact.  Pysch: Alert and oriented X 3, normal affect, Insight and Judgment appropriate.   Assessment and Plan  1. Essential hypertension  - EKG 12-Lead - Urinalysis, Routine w reflex microscopic - Microalbumin / creatinine urine ratio - CBC with Differential/Platelet - COMPLETE METABOLIC PANEL WITH GFR - Magnesium - TSH  2. Hyperlipidemia, mixed  - EKG 12-Lead - Lipid panel - TSH  3. Abnormal glucose  - EKG 12-Lead - Hemoglobin A1c -  Insulin, random  4. Vitamin D deficiency  - VITAMIN D 25 Hydroxyl  5. Prediabetes  - EKG 12-Lead - Hemoglobin A1c - Insulin, random  6. Chronic gout  - Uric acid  7. Screening for ischemic heart disease  - EKG 12-Lead  8. FHx: heart disease  - EKG 12-Lead  9. Medication management  - Urinalysis, Routine w reflex microscopic - Microalbumin / creatinine urine ratio - Uric acid - CBC with Differential/Platelet - COMPLETE METABOLIC PANEL WITH GFR - Magnesium - Lipid panel - TSH - Hemoglobin A1c - Insulin, random - VITAMIN D 25 Hydroxyl  10. Screening for colorectal cancer  - POC Hemoccult Bld/Stl          Patient was counseled in prudent diet to achieve/maintain BMI less than 25 for weight control, BP monitoring, regular exercise and medications. Discussed med's effects and SE's. Screening labs and tests as requested with regular follow-up as recommended. Over 40 minutes of exam, counseling, chart review and high complex critical decision making was performed.

## 2018-11-11 ENCOUNTER — Ambulatory Visit (INDEPENDENT_AMBULATORY_CARE_PROVIDER_SITE_OTHER): Payer: Medicare Other | Admitting: Internal Medicine

## 2018-11-11 VITALS — BP 134/82 | HR 80 | Temp 97.3°F | Resp 16 | Ht 67.0 in | Wt 177.2 lb

## 2018-11-11 DIAGNOSIS — M1A9XX0 Chronic gout, unspecified, without tophus (tophi): Secondary | ICD-10-CM | POA: Diagnosis not present

## 2018-11-11 DIAGNOSIS — Z79899 Other long term (current) drug therapy: Secondary | ICD-10-CM

## 2018-11-11 DIAGNOSIS — Z8249 Family history of ischemic heart disease and other diseases of the circulatory system: Secondary | ICD-10-CM

## 2018-11-11 DIAGNOSIS — Z1211 Encounter for screening for malignant neoplasm of colon: Secondary | ICD-10-CM

## 2018-11-11 DIAGNOSIS — Z1212 Encounter for screening for malignant neoplasm of rectum: Secondary | ICD-10-CM

## 2018-11-11 DIAGNOSIS — E782 Mixed hyperlipidemia: Secondary | ICD-10-CM | POA: Diagnosis not present

## 2018-11-11 DIAGNOSIS — Z136 Encounter for screening for cardiovascular disorders: Secondary | ICD-10-CM

## 2018-11-11 DIAGNOSIS — R7309 Other abnormal glucose: Secondary | ICD-10-CM | POA: Diagnosis not present

## 2018-11-11 DIAGNOSIS — I10 Essential (primary) hypertension: Secondary | ICD-10-CM

## 2018-11-11 DIAGNOSIS — E559 Vitamin D deficiency, unspecified: Secondary | ICD-10-CM | POA: Diagnosis not present

## 2018-11-11 DIAGNOSIS — R7303 Prediabetes: Secondary | ICD-10-CM

## 2018-11-11 MED ORDER — TOPIRAMATE 50 MG PO TABS: ORAL_TABLET | ORAL | 1 refills | Status: DC

## 2018-11-11 MED ORDER — PHENTERMINE HCL 37.5 MG PO TABS: ORAL_TABLET | ORAL | 5 refills | Status: DC

## 2018-11-12 LAB — INSULIN, RANDOM: Insulin: 12.5 u[IU]/mL (ref 2.0–19.6)

## 2018-11-12 LAB — COMPLETE METABOLIC PANEL WITH GFR
AG Ratio: 1.9 (calc) (ref 1.0–2.5)
ALT: 27 U/L (ref 6–29)
AST: 24 U/L (ref 10–35)
Albumin: 5 g/dL (ref 3.6–5.1)
Alkaline phosphatase (APISO): 63 U/L (ref 33–130)
BUN: 20 mg/dL (ref 7–25)
CO2: 31 mmol/L (ref 20–32)
Calcium: 10.5 mg/dL — ABNORMAL HIGH (ref 8.6–10.4)
Chloride: 101 mmol/L (ref 98–110)
Creat: 0.93 mg/dL (ref 0.50–0.99)
GFR, Est African American: 74 mL/min/{1.73_m2} (ref >=60)
GFR, Est Non African American: 64 mL/min/{1.73_m2} (ref >=60)
Globulin: 2.7 g/dL (calc) (ref 1.9–3.7)
Glucose, Bld: 99 mg/dL (ref 65–99)
POTASSIUM: 3.8 mmol/L (ref 3.5–5.3)
Sodium: 140 mmol/L (ref 135–146)
Total Bilirubin: 0.6 mg/dL (ref 0.2–1.2)
Total Protein: 7.7 g/dL (ref 6.1–8.1)

## 2018-11-12 LAB — LIPID PANEL
Cholesterol: 171 mg/dL (ref <200)
HDL: 49 mg/dL — AB (ref >50)
LDL Cholesterol (Calc): 96 mg/dL (calc)
Non-HDL Cholesterol (Calc): 122 mg/dL (calc) (ref <130)
Total CHOL/HDL Ratio: 3.5 (calc) (ref <5.0)
Triglycerides: 156 mg/dL — ABNORMAL HIGH (ref <150)

## 2018-11-12 LAB — CBC WITH DIFFERENTIAL/PLATELET
Absolute Monocytes: 439 cells/uL (ref 200–950)
BASOS ABS: 49 {cells}/uL (ref 0–200)
BASOS PCT: 0.8 %
EOS ABS: 98 {cells}/uL (ref 15–500)
Eosinophils Relative: 1.6 %
HCT: 42.3 % (ref 35.0–45.0)
Hemoglobin: 15.1 g/dL (ref 11.7–15.5)
Lymphs Abs: 2275 cells/uL (ref 850–3900)
MCH: 31.5 pg (ref 27.0–33.0)
MCHC: 35.7 g/dL (ref 32.0–36.0)
MCV: 88.1 fL (ref 80.0–100.0)
MONOS PCT: 7.2 %
MPV: 9.3 fL (ref 7.5–12.5)
Neutro Abs: 3239 cells/uL (ref 1500–7800)
Neutrophils Relative %: 53.1 %
PLATELETS: 233 10*3/uL (ref 140–400)
RBC: 4.8 10*6/uL (ref 3.80–5.10)
RDW: 12.4 % (ref 11.0–15.0)
TOTAL LYMPHOCYTE: 37.3 %
WBC: 6.1 10*3/uL (ref 3.8–10.8)

## 2018-11-12 LAB — URINALYSIS, ROUTINE W REFLEX MICROSCOPIC
Bacteria, UA: NONE SEEN /HPF
Bilirubin Urine: NEGATIVE
Glucose, UA: NEGATIVE
HGB URINE DIPSTICK: NEGATIVE
Hyaline Cast: NONE SEEN /LPF
Ketones, ur: NEGATIVE
Nitrite: NEGATIVE
PH: 7 (ref 5.0–8.0)
Protein, ur: NEGATIVE
RBC / HPF: NONE SEEN /HPF (ref 0–2)
Specific Gravity, Urine: 1.004 (ref 1.001–1.03)
Squamous Epithelial / LPF: NONE SEEN /HPF (ref <=5)
WBC UA: NONE SEEN /HPF (ref 0–5)

## 2018-11-12 LAB — VITAMIN D 25 HYDROXY (VIT D DEFICIENCY, FRACTURES): VIT D 25 HYDROXY: 70 ng/mL (ref 30–100)

## 2018-11-12 LAB — MICROALBUMIN / CREATININE URINE RATIO: Creatinine, Urine: 8 mg/dL — ABNORMAL LOW (ref 20–275)

## 2018-11-12 LAB — HEMOGLOBIN A1C
Hgb A1c MFr Bld: 5.8 % of total Hgb — ABNORMAL HIGH (ref <5.7)
Mean Plasma Glucose: 120 (calc)
eAG (mmol/L): 6.6 (calc)

## 2018-11-12 LAB — TSH: TSH: 1.27 mIU/L (ref 0.40–4.50)

## 2018-11-12 LAB — URIC ACID: Uric Acid, Serum: 4.7 mg/dL (ref 2.5–7.0)

## 2018-11-12 LAB — MAGNESIUM: Magnesium: 2.1 mg/dL (ref 1.5–2.5)

## 2018-11-14 DIAGNOSIS — M5136 Other intervertebral disc degeneration, lumbar region: Secondary | ICD-10-CM | POA: Diagnosis not present

## 2018-11-29 ENCOUNTER — Ambulatory Visit (INDEPENDENT_AMBULATORY_CARE_PROVIDER_SITE_OTHER): Payer: Medicare Other | Admitting: Podiatry

## 2018-11-29 ENCOUNTER — Encounter: Payer: Self-pay | Admitting: Podiatry

## 2018-11-29 DIAGNOSIS — M659 Synovitis and tenosynovitis, unspecified: Secondary | ICD-10-CM | POA: Diagnosis not present

## 2018-11-29 DIAGNOSIS — D1720 Benign lipomatous neoplasm of skin and subcutaneous tissue of unspecified limb: Secondary | ICD-10-CM

## 2018-11-29 DIAGNOSIS — L989 Disorder of the skin and subcutaneous tissue, unspecified: Secondary | ICD-10-CM | POA: Diagnosis not present

## 2018-12-02 NOTE — Progress Notes (Signed)
   Subjective: 68 year old female presenting today for follow up evaluation of a lipoma of the the right ankle and porokeratosis of the sub-second MPJ of the right foot. She sates the callus has improved significantly but the ankle pain has not improved at all. There are no modifying factors noted and she has not done anything at home for treatment. Patient is here for further evaluation and treatment.   Past Medical History:  Diagnosis Date  . Allergy   . Anxiety   . Bell palsy 02/04/2016   Left  . Cancer ([place]) 2011   BCC  . Hyperlipidemia   . Hypertension 2005  . Plantar fasciitis      Objective:  Physical Exam General: Alert and oriented x3 in no acute distress  Dermatology: Hyperkeratotic lesion present on the sub-second MPJ of the right foot. Pain on palpation with a central nucleated core noted. Skin is warm, dry and supple bilateral lower extremities. Negative for open lesions or macerations.  Vascular: Palpable pedal pulses bilaterally. No edema or erythema noted. Capillary refill within normal limits.  Neurological: Epicritic and protective threshold grossly intact bilaterally.   Musculoskeletal Exam: Non-adhered lipoma to the anterior lateral aspect of the right ankle joint. There is also a palpable non-adhered likely lipoma/soft tissue mass to the posterior lateral aspect of the right ankle. Pain on palpation at the keratotic lesion noted as well as to the anterior, medial and lateral aspects of the right ankle joint. Range of motion within normal limits bilateral. Muscle strength 5/5 in all groups bilateral.  Assessment: 1. Porokeratosis noted to the right sub-second MPJ 2. Right ankle lipoma  3. Right ankle synovitis    Plan of Care:  1. Patient evaluated. 2. Excisional debridement of keratoic lesion using a chisel blade was performed without incident. Salinocaine applied to area.  3. Light dressing applied.  4. Injection of 0.5 mLs Celestone Soluspan injected  into the right ankle joint.  5. Recommended good shoe gear.  6. Patient wants to hold off on surgery at this time.  7. Return to clinic in 8 weeks.      Edrick Kins, DPM [facility]  Dr. Edrick Kins, [address]                                        [place], [place] [address]                Office [phone]  Fax [phone]

## 2018-12-03 DIAGNOSIS — M25552 Pain in left hip: Secondary | ICD-10-CM | POA: Diagnosis not present

## 2018-12-03 DIAGNOSIS — R262 Difficulty in walking, not elsewhere classified: Secondary | ICD-10-CM | POA: Diagnosis not present

## 2018-12-05 ENCOUNTER — Other Ambulatory Visit: Payer: Self-pay | Admitting: Internal Medicine

## 2018-12-05 ENCOUNTER — Encounter: Payer: Self-pay | Admitting: Podiatry

## 2018-12-05 DIAGNOSIS — R262 Difficulty in walking, not elsewhere classified: Secondary | ICD-10-CM | POA: Diagnosis not present

## 2018-12-05 DIAGNOSIS — M25552 Pain in left hip: Secondary | ICD-10-CM | POA: Diagnosis not present

## 2018-12-05 MED ORDER — AZITHROMYCIN 250 MG PO TABS: ORAL_TABLET | ORAL | 1 refills | Status: DC

## 2018-12-05 MED ORDER — FLUTICASONE PROPIONATE 50 MCG/ACT NA SUSP: NASAL | 3 refills | Status: DC

## 2018-12-05 MED ORDER — PREDNISONE 20 MG PO TABS: ORAL_TABLET | ORAL | 0 refills | Status: DC

## 2018-12-09 ENCOUNTER — Other Ambulatory Visit: Payer: Self-pay | Admitting: Internal Medicine

## 2018-12-10 DIAGNOSIS — M25552 Pain in left hip: Secondary | ICD-10-CM | POA: Diagnosis not present

## 2018-12-10 DIAGNOSIS — R262 Difficulty in walking, not elsewhere classified: Secondary | ICD-10-CM | POA: Diagnosis not present

## 2018-12-12 DIAGNOSIS — R262 Difficulty in walking, not elsewhere classified: Secondary | ICD-10-CM | POA: Diagnosis not present

## 2018-12-12 DIAGNOSIS — M25552 Pain in left hip: Secondary | ICD-10-CM | POA: Diagnosis not present

## 2018-12-13 ENCOUNTER — Other Ambulatory Visit: Payer: Self-pay | Admitting: Internal Medicine

## 2018-12-13 DIAGNOSIS — M109 Gout, unspecified: Secondary | ICD-10-CM

## 2018-12-17 DIAGNOSIS — R262 Difficulty in walking, not elsewhere classified: Secondary | ICD-10-CM | POA: Diagnosis not present

## 2018-12-17 DIAGNOSIS — M25552 Pain in left hip: Secondary | ICD-10-CM | POA: Diagnosis not present

## 2018-12-19 DIAGNOSIS — R262 Difficulty in walking, not elsewhere classified: Secondary | ICD-10-CM | POA: Diagnosis not present

## 2018-12-19 DIAGNOSIS — M25552 Pain in left hip: Secondary | ICD-10-CM | POA: Diagnosis not present

## 2018-12-20 ENCOUNTER — Other Ambulatory Visit: Payer: Medicare Other

## 2018-12-23 DIAGNOSIS — L812 Freckles: Secondary | ICD-10-CM | POA: Diagnosis not present

## 2018-12-23 DIAGNOSIS — L820 Inflamed seborrheic keratosis: Secondary | ICD-10-CM | POA: Diagnosis not present

## 2018-12-23 DIAGNOSIS — L578 Other skin changes due to chronic exposure to nonionizing radiation: Secondary | ICD-10-CM | POA: Diagnosis not present

## 2018-12-23 DIAGNOSIS — L821 Other seborrheic keratosis: Secondary | ICD-10-CM | POA: Diagnosis not present

## 2018-12-24 DIAGNOSIS — M25552 Pain in left hip: Secondary | ICD-10-CM | POA: Diagnosis not present

## 2018-12-24 DIAGNOSIS — R262 Difficulty in walking, not elsewhere classified: Secondary | ICD-10-CM | POA: Diagnosis not present

## 2018-12-26 DIAGNOSIS — M25552 Pain in left hip: Secondary | ICD-10-CM | POA: Diagnosis not present

## 2018-12-26 DIAGNOSIS — R262 Difficulty in walking, not elsewhere classified: Secondary | ICD-10-CM | POA: Diagnosis not present

## 2018-12-30 DIAGNOSIS — H01003 Unspecified blepharitis right eye, unspecified eyelid: Secondary | ICD-10-CM | POA: Diagnosis not present

## 2018-12-31 DIAGNOSIS — M25552 Pain in left hip: Secondary | ICD-10-CM | POA: Diagnosis not present

## 2018-12-31 DIAGNOSIS — R262 Difficulty in walking, not elsewhere classified: Secondary | ICD-10-CM | POA: Diagnosis not present

## 2019-01-02 DIAGNOSIS — M25552 Pain in left hip: Secondary | ICD-10-CM | POA: Diagnosis not present

## 2019-01-02 DIAGNOSIS — R262 Difficulty in walking, not elsewhere classified: Secondary | ICD-10-CM | POA: Diagnosis not present

## 2019-01-03 DIAGNOSIS — M25552 Pain in left hip: Secondary | ICD-10-CM | POA: Diagnosis not present

## 2019-01-03 DIAGNOSIS — R262 Difficulty in walking, not elsewhere classified: Secondary | ICD-10-CM | POA: Diagnosis not present

## 2019-01-06 DIAGNOSIS — H01003 Unspecified blepharitis right eye, unspecified eyelid: Secondary | ICD-10-CM | POA: Diagnosis not present

## 2019-01-07 DIAGNOSIS — M25552 Pain in left hip: Secondary | ICD-10-CM | POA: Diagnosis not present

## 2019-01-07 DIAGNOSIS — R262 Difficulty in walking, not elsewhere classified: Secondary | ICD-10-CM | POA: Diagnosis not present

## 2019-01-09 DIAGNOSIS — R262 Difficulty in walking, not elsewhere classified: Secondary | ICD-10-CM | POA: Diagnosis not present

## 2019-01-09 DIAGNOSIS — M25552 Pain in left hip: Secondary | ICD-10-CM | POA: Diagnosis not present

## 2019-01-13 DIAGNOSIS — R262 Difficulty in walking, not elsewhere classified: Secondary | ICD-10-CM | POA: Diagnosis not present

## 2019-01-13 DIAGNOSIS — M25552 Pain in left hip: Secondary | ICD-10-CM | POA: Diagnosis not present

## 2019-01-17 DIAGNOSIS — R262 Difficulty in walking, not elsewhere classified: Secondary | ICD-10-CM | POA: Diagnosis not present

## 2019-01-17 DIAGNOSIS — M25552 Pain in left hip: Secondary | ICD-10-CM | POA: Diagnosis not present

## 2019-01-20 DIAGNOSIS — M7062 Trochanteric bursitis, left hip: Secondary | ICD-10-CM | POA: Diagnosis not present

## 2019-01-20 DIAGNOSIS — M7542 Impingement syndrome of left shoulder: Secondary | ICD-10-CM | POA: Diagnosis not present

## 2019-01-21 DIAGNOSIS — R262 Difficulty in walking, not elsewhere classified: Secondary | ICD-10-CM | POA: Diagnosis not present

## 2019-01-21 DIAGNOSIS — M25552 Pain in left hip: Secondary | ICD-10-CM | POA: Diagnosis not present

## 2019-01-23 DIAGNOSIS — L820 Inflamed seborrheic keratosis: Secondary | ICD-10-CM | POA: Diagnosis not present

## 2019-01-23 DIAGNOSIS — L821 Other seborrheic keratosis: Secondary | ICD-10-CM | POA: Diagnosis not present

## 2019-01-23 DIAGNOSIS — Z85828 Personal history of other malignant neoplasm of skin: Secondary | ICD-10-CM | POA: Diagnosis not present

## 2019-01-23 DIAGNOSIS — M25552 Pain in left hip: Secondary | ICD-10-CM | POA: Diagnosis not present

## 2019-01-23 DIAGNOSIS — R262 Difficulty in walking, not elsewhere classified: Secondary | ICD-10-CM | POA: Diagnosis not present

## 2019-01-23 DIAGNOSIS — L739 Follicular disorder, unspecified: Secondary | ICD-10-CM | POA: Diagnosis not present

## 2019-01-23 DIAGNOSIS — L578 Other skin changes due to chronic exposure to nonionizing radiation: Secondary | ICD-10-CM | POA: Diagnosis not present

## 2019-01-24 ENCOUNTER — Encounter: Payer: Self-pay | Admitting: Podiatry

## 2019-01-24 ENCOUNTER — Ambulatory Visit (INDEPENDENT_AMBULATORY_CARE_PROVIDER_SITE_OTHER): Payer: Medicare Other | Admitting: Podiatry

## 2019-01-24 DIAGNOSIS — M7751 Other enthesopathy of right foot: Secondary | ICD-10-CM | POA: Diagnosis not present

## 2019-01-24 DIAGNOSIS — M659 Synovitis and tenosynovitis, unspecified: Secondary | ICD-10-CM

## 2019-01-27 DIAGNOSIS — M7062 Trochanteric bursitis, left hip: Secondary | ICD-10-CM | POA: Diagnosis not present

## 2019-01-27 NOTE — Progress Notes (Signed)
   Subjective: 68 year old female presenting today for follow up evaluation of a lipoma of the the right ankle as well as right ankle pain. She reports continued constant pain. She reports associated tightness of the third toe and bruising to the dorsum of the foot. Walking increases the pain. She has been wearing good shoes to help alleviate the symptoms. Patient is here for further evaluation and treatment.   Past Medical History:  Diagnosis Date  . Allergy   . Anxiety   . Bell palsy 02/04/2016   Left  . Cancer ([place]) 2011   BCC  . Hyperlipidemia   . Hypertension 2005  . Plantar fasciitis      Objective:  Physical Exam General: Alert and oriented x3 in no acute distress  Dermatology: Hyperkeratotic lesion present on the sub-second MPJ of the right foot. Pain on palpation with a central nucleated core noted. Skin is warm, dry and supple bilateral lower extremities. Negative for open lesions or macerations.  Vascular: Palpable pedal pulses bilaterally. No edema or erythema noted. Capillary refill within normal limits.  Neurological: Epicritic and protective threshold grossly intact bilaterally.   Musculoskeletal Exam: Non-adhered lipoma to the anterior lateral aspect of the right ankle joint. There is also a palpable non-adhered likely lipoma/soft tissue mass to the posterior lateral aspect of the right ankle. Pain with palpation noted to the 2nd MPJ of the right foot. Range of motion within normal limits bilateral. Muscle strength 5/5 in all groups bilateral.  Assessment: 1. 2nd MPJ capsulitis right  2. Right ankle lipoma  3. Right ankle synovitis - resolved    Plan of Care:  1. Patient evaluated. 2. Injection of 0.5 mLs Celestone Soluspan injected into the 2nd MPJ of the right foot.  3. Orders for physical therapy placed for twice weekly for six weeks with dry needling as needed.  4. Continue wearing good shoes.  5. Return to clinic as needed.      Edrick Kins,  DPM [facility]  Dr. Edrick Kins, [address]                                        [place], [place] [address]                Office [phone]  Fax [phone]

## 2019-01-31 DIAGNOSIS — M25552 Pain in left hip: Secondary | ICD-10-CM | POA: Diagnosis not present

## 2019-01-31 DIAGNOSIS — R262 Difficulty in walking, not elsewhere classified: Secondary | ICD-10-CM | POA: Diagnosis not present

## 2019-02-03 DIAGNOSIS — H01003 Unspecified blepharitis right eye, unspecified eyelid: Secondary | ICD-10-CM | POA: Diagnosis not present

## 2019-02-04 DIAGNOSIS — R262 Difficulty in walking, not elsewhere classified: Secondary | ICD-10-CM | POA: Diagnosis not present

## 2019-02-04 DIAGNOSIS — M25552 Pain in left hip: Secondary | ICD-10-CM | POA: Diagnosis not present

## 2019-02-07 DIAGNOSIS — M25552 Pain in left hip: Secondary | ICD-10-CM | POA: Diagnosis not present

## 2019-02-07 DIAGNOSIS — R262 Difficulty in walking, not elsewhere classified: Secondary | ICD-10-CM | POA: Diagnosis not present

## 2019-02-11 DIAGNOSIS — L820 Inflamed seborrheic keratosis: Secondary | ICD-10-CM | POA: Diagnosis not present

## 2019-02-11 DIAGNOSIS — M25552 Pain in left hip: Secondary | ICD-10-CM | POA: Diagnosis not present

## 2019-02-11 DIAGNOSIS — R262 Difficulty in walking, not elsewhere classified: Secondary | ICD-10-CM | POA: Diagnosis not present

## 2019-02-13 DIAGNOSIS — M25552 Pain in left hip: Secondary | ICD-10-CM | POA: Diagnosis not present

## 2019-02-13 DIAGNOSIS — R262 Difficulty in walking, not elsewhere classified: Secondary | ICD-10-CM | POA: Diagnosis not present

## 2019-02-15 ENCOUNTER — Other Ambulatory Visit: Payer: Self-pay | Admitting: Internal Medicine

## 2019-02-15 MED ORDER — IPRATROPIUM BROMIDE 0.06 % NA SOLN: NASAL | 3 refills | Status: DC

## 2019-02-18 DIAGNOSIS — R262 Difficulty in walking, not elsewhere classified: Secondary | ICD-10-CM | POA: Diagnosis not present

## 2019-02-18 DIAGNOSIS — M25552 Pain in left hip: Secondary | ICD-10-CM | POA: Diagnosis not present

## 2019-02-19 ENCOUNTER — Ambulatory Visit: Payer: Self-pay | Admitting: Adult Health

## 2019-02-25 ENCOUNTER — Telehealth: Payer: Self-pay | Admitting: Adult Health Nurse Practitioner

## 2019-02-25 DIAGNOSIS — J0140 Acute pansinusitis, unspecified: Secondary | ICD-10-CM

## 2019-02-25 DIAGNOSIS — Z9109 Other allergy status, other than to drugs and biological substances: Secondary | ICD-10-CM

## 2019-02-25 DIAGNOSIS — M25552 Pain in left hip: Secondary | ICD-10-CM | POA: Diagnosis not present

## 2019-02-25 DIAGNOSIS — R0981 Nasal congestion: Secondary | ICD-10-CM

## 2019-02-25 DIAGNOSIS — R262 Difficulty in walking, not elsewhere classified: Secondary | ICD-10-CM | POA: Diagnosis not present

## 2019-02-25 DIAGNOSIS — J0100 Acute maxillary sinusitis, unspecified: Secondary | ICD-10-CM

## 2019-02-25 DIAGNOSIS — I10 Essential (primary) hypertension: Secondary | ICD-10-CM

## 2019-02-25 MED ORDER — AZITHROMYCIN 250 MG PO TABS: 250.0000 mg | ORAL_TABLET | Freq: Every day | ORAL | 0 refills | Status: DC

## 2019-02-25 MED ORDER — PREDNISONE 10 MG (21) PO TBPK: ORAL_TABLET | Freq: Every day | ORAL | 0 refills | Status: DC

## 2019-02-25 NOTE — Telephone Encounter (Signed)
First Attempt contact 02/24/18:  11:30am LVM, will return call later this evening.   Virtual Visit via Telephone Note  I connected with Skipper Cliche on 02/25/19 at 3:40pm by telephone and verified that I am speaking with the correct person using two identifiers.    I discussed the limitations, risks, security and privacy concerns of performing an evaluation and management service by telephone and the availability of in person appointments. I also discussed with the patient that there may be a patient responsible charge related to this service. The patient expressed understanding and agreed to proceed.   History of Present Illness: [name] is a 68 yo female contacting the office today for evaluation of her sinus symptoms.  Reports that this has been going on for months.  Now she is having clear drainage during the day and increasing at night.  The drainage is clear.  She was taking clairitin and Atrovent nasal spray twice a day as well a benedryl at night.  Reports increased pressure to bilateral under her eyes on sides of her nose and forehead area.  There in increased tenderness when bending over to put her shoes on.  She denies sore throat, fever, headache, vision change, Chest pain or shortness of breath.      Observations/Objective: She is able to carry on conversation without difficulties or shortness of breath.  Her tone has a nasal quality compared to baseline.   Assessment and Plan:  Diagnoses and all orders for this visit:  Acute maxillary sinusitis, recurrence not specified -     predniSONE (STERAPRED UNI-PAK 21 TAB) 10 MG (21) TBPK tablet; Take by mouth daily. Follow directions on Taper pack -     azithromycin (ZITHROMAX) 250 MG tablet; Take 1 tablet (250 mg total) by mouth daily. Take 2 tablets PO on day 1, then 1 tablet PO Q24H x 4 days   Nasal congestion  Stop taking Benadryl at this time  Sudfed (pseudoephedrine)-Monitor your blood pressure, STOP if elevation  occurs. Nasal decongestant Take one tablet every 6 hours as needed Check packaging instructions IF you take blood pressure medication, this medication can raise your blood pressure. Monitor your blood pressure and if elevation occurs, stop medication. You can get this at any pharmacy, will need your drivers license to purchase this  Saline Nasal Spray: - continue this You can get this at any pharmacy Use as directed on package This will help to sooth inside of your nose from irritation  [facility] Sinus Rinse / Neti Pot Use warm bottled or distilled water DO NOT use tap water! Use twice a day as needed This will help to sooth irritated sinuses and clear nasal congestion If using nasal sprays, do so after completing this.  Continue nasal spray  For Sinus Pain  Ibuprofen / Advil / Motrin - Will also help with inflammation  600mg  every six hours OR 800mg  every 8 hours If you have this at home each tablet is 200mg .  OR  Tylenol Extra Strength 500mg  Take 1-2 tablets every 8 hours as needed for fever or pain Do not take more than 4,000mg  to tylenol in 24 hours  May rotate between Tylenol and ibuprofen.  They are different.        Follow Up Instructions:    I discussed the assessment and treatment plan with the patient. The patient was provided an opportunity to ask questions and all were answered. The patient agreed with the plan and demonstrated an understanding of the instructions.  The patient was advised to call back or seek an in-person evaluation if the symptoms worsen or if the condition fails to improve as anticipated.  Electronic summary of visit sent directly to patient via MyChart.  I provided 20 minutes of non-face-to-face time during this encounter.  Garnet Sierras, NP [facility] 02/25/2019  3:47 PM

## 2019-02-27 ENCOUNTER — Encounter: Payer: Self-pay | Admitting: Adult Health Nurse Practitioner

## 2019-02-27 MED ORDER — CETIRIZINE HCL 10 MG PO TABS: 10.0000 mg | ORAL_TABLET | Freq: Every day | ORAL | 3 refills | Status: DC

## 2019-02-27 NOTE — Progress Notes (Signed)
Virtual Visit via Telephone Note  I connected with [name] on 02/25/19 at 1:30pm  by telephone and verified that I am speaking with the correct person using two identifiers.   I discussed the limitations, risks, security and privacy concerns of performing an evaluation and management service by telephone and the availability of in person appointments. I also discussed with the patient that there may be a patient responsible charge related to this service. The patient expressed understanding and agreed to proceed.   History of Present Illness: Reports she has been fighting sinus symptoms for the past three weeks.  Reports that she has been taking claratin daily and using Ipatropium nasal spray twice a day.  She is also taking benedryl at night.  She has had an increase in sinus pressure with tenderness under her eyes bilaterally extending to nose.  Reports that she deals with sinus symptoms mailly in the Spring and fall, tend to be the worst.  She has not used a NetiPot in the past.  She denies any fevers, headache, vision change, change in cognition, shortness of breath or cough.   Observations/Objective: She is alert and oriented.  She is able to carry on conversation with out apparent shortness of breath, audible wheezing.  Her voice is nasal sounding comparative to baseline.  She endorses pain and increased pressure when benidng over toward knees, while sitting.  Also reports tenderness while self palpating these areas.  Assessment and Plan:  Diagnoses and all orders for this visit:  Acute pansinusitis, recurrence not specified -Azithromyacin, take two tablets today and one daily -Prednisone taper pack Discussed neti pot, DO NOT USE TAP WATER.  Bottled or distilled Use BID Instructions given in AVS sent via MyChart  Essential hypertension Monitor blood pressure  Environmental allergies -Cetirazine 10mg , one tablet by mouth daily May take at night, may cause  drowsiness.    Follow Up Instructions:    I discussed the assessment and treatment plan with the patient. The patient was provided an opportunity to ask questions and all were answered. The patient agreed with the plan and demonstrated an understanding of the instructions.   The patient was advised to call back or seek an in-person evaluation if the symptoms worsen or if the condition fails to improve as anticipated.  After visit summary was sent directly to patient via [place].  I provided 15 minutes of non-face-to-face time during this encounter.   Garnet Sierras, NP [facility] 02/25/2019  1:30 PM

## 2019-02-27 NOTE — Patient Instructions (Signed)
See My Chart Message.

## 2019-03-01 ENCOUNTER — Other Ambulatory Visit: Payer: Self-pay | Admitting: Internal Medicine

## 2019-03-01 ENCOUNTER — Other Ambulatory Visit: Payer: Medicare Other | Admitting: Internal Medicine

## 2019-03-01 DIAGNOSIS — L739 Follicular disorder, unspecified: Secondary | ICD-10-CM

## 2019-03-01 DIAGNOSIS — L729 Follicular cyst of the skin and subcutaneous tissue, unspecified: Principal | ICD-10-CM

## 2019-03-01 MED ORDER — MUPIROCIN CALCIUM 2 % NA OINT: 1.0000 "application " | TOPICAL_OINTMENT | Freq: Two times a day (BID) | NASAL | 0 refills | Status: DC

## 2019-03-01 MED ORDER — CEPHALEXIN 500 MG PO CAPS: ORAL_CAPSULE | ORAL | 1 refills | Status: DC

## 2019-03-01 NOTE — Progress Notes (Addendum)
8:26 AM  Ok just to clarify start antibiotics again once finished and add back Benadryl and Sudafed and nose spray as needed. Will Sudafed help the fluid feeling in my right ear? Thanks again   Me  to [name]        8:07 AM  =================================================================================================== [name],   - Your does have [name] refill on it, so yes - get the refill  - Ok to restart the Claritin /Loratadine in the am & Benadryl at Bedtime  - Use antihistamine nasal spray on an "as needed basis" - Ok to use either sudafed or the the OTC Sudafed-PE (phenylephrine) also known store brands as "Decongestabs"   - bill mck   =================================================================================================== ===================================================================================================  TELEVISIT 03/01/2019 ( Over 20 minutes of  counseling, chart review and  critical decision making was performed)  Subjective: Patient call reporting s/p Tx Sinusitis w/Z-Pak & Prednisone taper has developed "raw inside nose " with pustule [name] can be expressed   Dx:  1) Nasal Folliculitis  Rx: - Keflex 500 mg #28 - QID x 7 days   - Bactroban Ung bid  ============================================================================= ============================================================================= Last read by Skipper Cliche at 8:15 AM on 03/07/2019.   Dr. Melford Aase I wanted to follow up today so not to bother you this weekend. I will finish the cephalexin tomorrow. I am much better in terms of the area inside my nose but still [name] tiny bit irritated but overall looks like the right antibiotic. I am still using ointment inside my nose 2x daily and neti pot and saline nasal spray using claritin in am and [name] is not taking care of the clear sinus drainage which has increased since I stopped the benadryl at night and the  antihistimine spray at night.   Should I add [name] back? [name] and switched me to Zyrtec which I could not take. If I need to take anything else in place of Claritin it needs to be non-drowsy. I am [name] little congested too and my right ear feels [name] little stopped up. I just don't want to have [name] relapse since I am feeling better so I wondered if I need to do another round of antibiotics? another non drowsy antihistimine? add sudafed to current regimen? will wait for your response [name]    March 09, 2019  [name]    7:41 PM  Dr.Dontravious Camille. This message is for Monday. I did get refill on antibiotics and continuing doing everything as instructed.  Claritin in am  Sudafed during day  Benadryl at night  Antibiotic ointment inside nose twice daily  Saline nasal spray/neti pot.  Even though I am feeling better than I did before starting on this antibiotic, I think I need to be seen to evaluate what is going on with my sinuses and inside my nose. I want to rule out [name] polyp or anything else. Still sensitive to touch on both sides of my nose. My right ear still feels like it has fluid in it. If I need to see you [name]'s fine just let me know when or if I need to see ENT I would prefer to see Dr. Arville Care in [place]. I saw him about 10 years ago. If not him, then someone at [facility] in [place], possibly Dr. Richardson Landry if [name]'s ok or whoever you suggest.I'm sure your office can get me in sooner than I can. Wednesday is the only day I  cannot do.  Will wait for your instruction.  [name]  DOB [name]  [phone]   =================================================================================================== March 07, 2019   - Yep, call Pharmacy for the refill  - re-add the loratadine & Benadryl & then may use the anti-histamine nasal spray if needed  - hopefully adding Sudafed or OTC Phenylephrine will help with the sensation of ear fluid    bill mck   ===================================================================================================  7:12 AM  Dr. Melford Aase. Just wanted you to know if I had waited [name] couple more days I would probably have realized I did not need to see anyone because I was getting better but I appreciate the referral to Dr. Lucia Gaskins. You were on the right track with everything you prescribed, I just got concerned with the way my nose looked inside. Next time I will come to see you instead because I do trust your judgement and I appreciate everything you do for me. I just wanted to say thank you today for all you do.   [name]   March 10, 2019

## 2019-03-03 MED ORDER — LORATADINE 10 MG PO TABS: 10.0000 mg | ORAL_TABLET | Freq: Every day | ORAL | 1 refills | Status: DC

## 2019-03-07 ENCOUNTER — Other Ambulatory Visit: Payer: Self-pay | Admitting: Internal Medicine

## 2019-03-07 DIAGNOSIS — L739 Follicular disorder, unspecified: Secondary | ICD-10-CM

## 2019-03-07 DIAGNOSIS — L729 Follicular cyst of the skin and subcutaneous tissue, unspecified: Principal | ICD-10-CM

## 2019-03-10 ENCOUNTER — Telehealth: Payer: Self-pay | Admitting: Internal Medicine

## 2019-03-10 ENCOUNTER — Other Ambulatory Visit: Payer: Self-pay | Admitting: Internal Medicine

## 2019-03-10 DIAGNOSIS — J0140 Acute pansinusitis, unspecified: Secondary | ICD-10-CM

## 2019-03-10 NOTE — Telephone Encounter (Signed)
Called patient with referral update to ENT; have sent to [facility], [facility], and Dr Radene Journey per patient request.  Question: Patient states she is not improving, extremely raw inside nose. She is still taking 2nd round of abx and continuing nettie pot. She states she has d/c nose spray, benadryl, and nasal ointment. Please advise if these changes are recommended.

## 2019-03-11 DIAGNOSIS — J310 Chronic rhinitis: Secondary | ICD-10-CM | POA: Diagnosis not present

## 2019-03-11 DIAGNOSIS — J300 Vasomotor rhinitis: Secondary | ICD-10-CM | POA: Diagnosis not present

## 2019-03-11 DIAGNOSIS — J3481 Nasal mucositis (ulcerative): Secondary | ICD-10-CM | POA: Diagnosis not present

## 2019-03-12 DIAGNOSIS — D229 Melanocytic nevi, unspecified: Secondary | ICD-10-CM | POA: Diagnosis not present

## 2019-03-12 DIAGNOSIS — L578 Other skin changes due to chronic exposure to nonionizing radiation: Secondary | ICD-10-CM | POA: Diagnosis not present

## 2019-03-12 DIAGNOSIS — D1800 Hemangioma unspecified site: Secondary | ICD-10-CM | POA: Diagnosis not present

## 2019-03-12 DIAGNOSIS — D692 Other nonthrombocytopenic purpura: Secondary | ICD-10-CM | POA: Diagnosis not present

## 2019-03-12 DIAGNOSIS — L814 Other melanin hyperpigmentation: Secondary | ICD-10-CM | POA: Diagnosis not present

## 2019-03-12 DIAGNOSIS — L570 Actinic keratosis: Secondary | ICD-10-CM | POA: Diagnosis not present

## 2019-03-12 DIAGNOSIS — I781 Nevus, non-neoplastic: Secondary | ICD-10-CM | POA: Diagnosis not present

## 2019-03-12 DIAGNOSIS — L821 Other seborrheic keratosis: Secondary | ICD-10-CM | POA: Diagnosis not present

## 2019-03-12 DIAGNOSIS — L820 Inflamed seborrheic keratosis: Secondary | ICD-10-CM | POA: Diagnosis not present

## 2019-03-12 DIAGNOSIS — Z85828 Personal history of other malignant neoplasm of skin: Secondary | ICD-10-CM | POA: Diagnosis not present

## 2019-03-12 DIAGNOSIS — Z1283 Encounter for screening for malignant neoplasm of skin: Secondary | ICD-10-CM | POA: Diagnosis not present

## 2019-03-12 DIAGNOSIS — I8393 Asymptomatic varicose veins of bilateral lower extremities: Secondary | ICD-10-CM | POA: Diagnosis not present

## 2019-03-13 ENCOUNTER — Other Ambulatory Visit: Payer: Self-pay | Admitting: Adult Health

## 2019-03-13 DIAGNOSIS — M109 Gout, unspecified: Secondary | ICD-10-CM

## 2019-03-15 ENCOUNTER — Other Ambulatory Visit: Payer: Self-pay | Admitting: Internal Medicine

## 2019-03-15 MED ORDER — TRIAMTERENE-HCTZ 37.5-25 MG PO TABS: ORAL_TABLET | ORAL | 3 refills | Status: DC

## 2019-03-27 DIAGNOSIS — R21 Rash and other nonspecific skin eruption: Secondary | ICD-10-CM | POA: Diagnosis not present

## 2019-03-27 DIAGNOSIS — L0390 Cellulitis, unspecified: Secondary | ICD-10-CM | POA: Diagnosis not present

## 2019-03-27 DIAGNOSIS — L299 Pruritus, unspecified: Secondary | ICD-10-CM | POA: Diagnosis not present

## 2019-03-28 ENCOUNTER — Other Ambulatory Visit: Payer: Self-pay | Admitting: Internal Medicine

## 2019-03-28 DIAGNOSIS — L739 Follicular disorder, unspecified: Secondary | ICD-10-CM

## 2019-03-28 DIAGNOSIS — L729 Follicular cyst of the skin and subcutaneous tissue, unspecified: Principal | ICD-10-CM

## 2019-03-28 MED ORDER — CEPHALEXIN 500 MG PO CAPS: ORAL_CAPSULE | ORAL | 1 refills | Status: DC

## 2019-03-31 DIAGNOSIS — L0390 Cellulitis, unspecified: Secondary | ICD-10-CM | POA: Diagnosis not present

## 2019-03-31 DIAGNOSIS — R21 Rash and other nonspecific skin eruption: Secondary | ICD-10-CM | POA: Diagnosis not present

## 2019-03-31 DIAGNOSIS — L299 Pruritus, unspecified: Secondary | ICD-10-CM | POA: Diagnosis not present

## 2019-04-17 DIAGNOSIS — L03811 Cellulitis of head [any part, except face]: Secondary | ICD-10-CM | POA: Diagnosis not present

## 2019-04-17 DIAGNOSIS — L299 Pruritus, unspecified: Secondary | ICD-10-CM | POA: Diagnosis not present

## 2019-04-21 ENCOUNTER — Other Ambulatory Visit: Payer: Self-pay | Admitting: Internal Medicine

## 2019-04-21 DIAGNOSIS — E782 Mixed hyperlipidemia: Secondary | ICD-10-CM

## 2019-04-23 DIAGNOSIS — J301 Allergic rhinitis due to pollen: Secondary | ICD-10-CM | POA: Diagnosis not present

## 2019-04-23 DIAGNOSIS — J340 Abscess, furuncle and carbuncle of nose: Secondary | ICD-10-CM | POA: Diagnosis not present

## 2019-04-28 DIAGNOSIS — M7542 Impingement syndrome of left shoulder: Secondary | ICD-10-CM | POA: Diagnosis not present

## 2019-04-28 DIAGNOSIS — M7541 Impingement syndrome of right shoulder: Secondary | ICD-10-CM | POA: Diagnosis not present

## 2019-05-05 ENCOUNTER — Other Ambulatory Visit: Payer: Self-pay | Admitting: *Deleted

## 2019-05-05 MED ORDER — TOPIRAMATE 50 MG PO TABS: ORAL_TABLET | ORAL | 1 refills | Status: DC

## 2019-05-14 DIAGNOSIS — L820 Inflamed seborrheic keratosis: Secondary | ICD-10-CM | POA: Diagnosis not present

## 2019-05-14 DIAGNOSIS — L821 Other seborrheic keratosis: Secondary | ICD-10-CM | POA: Diagnosis not present

## 2019-05-14 DIAGNOSIS — L578 Other skin changes due to chronic exposure to nonionizing radiation: Secondary | ICD-10-CM | POA: Diagnosis not present

## 2019-05-18 ENCOUNTER — Other Ambulatory Visit: Payer: Self-pay | Admitting: Internal Medicine

## 2019-05-18 MED ORDER — CIPROFLOXACIN HCL 500 MG PO TABS: ORAL_TABLET | ORAL | 0 refills | Status: DC

## 2019-05-22 DIAGNOSIS — J310 Chronic rhinitis: Secondary | ICD-10-CM | POA: Diagnosis not present

## 2019-05-22 NOTE — Progress Notes (Signed)
FOLLOW UP  Assessment and Plan:   Hypertension Fairly controlled with current medications  Monitor blood pressure at home; patient to call if consistently greater than 130/80 Continue DASH diet.   Reminder to go to the ER if any CP, SOB, nausea, dizziness, severe HA, changes vision/speech, left arm numbness and tingling and jaw pain.  Cholesterol Currently at goal; continue meds Continue low cholesterol diet and exercise.  Check lipid panel.   Prediabetes Continue diet and exercise.  Perform daily foot/skin check, notify office of any concerning changes.  Defer A1C to CPE; check CMP/GFR  Overweight with co morbidities Long discussion about weight loss, diet, and exercise Recommended diet heavy in fruits and veggies and low in animal meats, cheeses, and dairy products, appropriate calorie intake Discussed ideal weight for height Doing very well with phentermine/topamax - advised to hold until symptoms resolve Will follow up in 3 months  Vitamin D Def At goal at last visit; continue supplementation to maintain goal of 60-100 Defer Vit D level  Gout Continue allopurinol Diet discussed Check uric acid as needed  Nausea/diaphoresis/family hx of MI in mother She is very concerned about possible cardiac etiology today due to history of MI in mother at her age; female, may present with atypical symptoms; she does have several risk factors; will check EKG and Troponin today - EKG shows NSR with L axis deviation consistent with previous, no ST changes or other significant changes from last in 11/10/2018 Alternately discussed upper GI etiology though exam is benign today; she declines pancreatic labs or [place] at this time but may consider if symptoms are persistent; check CBC, CMP The patient was advised to call immediately if she has any concerning symptoms in the interval. The patient voices understanding of current treatment options and is in agreement with the current care plan.The  patient knows to call the clinic with any problems, questions or concerns or go to the ER if any further progression of symptoms.  Advised initiate bASA   Continue diet and meds as discussed. Further disposition pending results of labs. Discussed med's effects and SE's.   Over 30 minutes of exam, counseling, chart review, and critical decision making was performed.   Future Appointments  Date Time Provider [place]  08/28/2019  9:00 AM Liane Comber, NP [facility] None  11/27/2019  9:00 AM Unk Pinto, MD [facility] None    ----------------------------------------------------------------------------------------------------------------------  HPI 68 y.o. female  presents for 3 month follow up on hypertension, cholesterol, prediabetes, weight and vitamin D deficiency.   She reports persistent nausea; began suddenly 3 days ago, Saturday night; she reports suddenly became severely nauseous, diaphoretic for several hours; denies abd pain, CP, dyspnea, palpitations, dizziness, LE edema, severe anxiety; denies neck/jaw UE pain; she does endorse fatigue/malaise all day yesterday and stayed in bed. No atypical foods prior to onset. No other family members with similar symptoms. Denies fever/chills/abd pain, d/constipation/emesis. Nausea is persistent, not aggravated by food intake though endorses poor appetite. She has not had any abd surgeries, retains gallbladder. She does have significant family family history of heart attack in mother at 39. Has never had cardiac screening.   She has hx of anxiety but currently off of medications but reports she is doing fairly.   she is prescribed phentermine and topamax for weight loss.  While on the medication they have lost 19 lbs since last visit. They deny palpitations, anxiety, trouble sleeping, elevated BP.   BMI is Body mass index is 24.84 kg/m., she is working  on diet and exercise, walking daily in the morning, being active outdoors in  general, eating lots of chicken and vegetables, still splurges but in moderation. She endorses 65+ fluid ounces of fluid daily.  Wt Readings from Last 3 Encounters:  05/26/19 158 lb 9.6 oz (71.9 kg)  11/11/18 177 lb 3.2 oz (80.4 kg)  08/12/18 180 lb (81.6 kg)    Her blood pressure has been controlled at home, today their BP is BP: 138/88  She does not workout. She denies chest pain, shortness of breath, dizziness.   She is on cholesterol medication Pravastatin 20 mg MWF and denies myalgias. Her cholesterol is at goal. The cholesterol last visit was:   Lab Results  Component Value Date   CHOL 171 11/11/2018   HDL 49 (L) 11/11/2018   LDLCALC 96 11/11/2018   TRIG 156 (H) 11/11/2018   CHOLHDL 3.5 11/11/2018    She has been working on diet and exercise for prediabetes, and denies increased appetite, nausea, paresthesia of the feet, polydipsia, polyuria and visual disturbances. Last A1C in the office was:  Lab Results  Component Value Date   HGBA1C 5.8 (H) 11/11/2018   Patient is on Vitamin D supplement.   Lab Results  Component Value Date   VD25OH 72 11/11/2018     Patient is on allopurinol for gout and does not report a recent flare.  Lab Results  Component Value Date   LABURIC 4.7 11/11/2018      Current Medications:  Current Outpatient Medications on File Prior to Visit  Medication Sig  . allopurinol (ZYLOPRIM) 300 MG tablet Take 1 tablet Daily to Prevent Gout  . Biotin 10 MG TABS Take by mouth daily.  . calcium carbonate (OSCAL) 1500 (600 Ca) MG TABS tablet Take by mouth daily with breakfast.  . cephALEXin (KEFLEX) 500 MG capsule Take 1 capsule 4 x /day after meals & bedtime with food  For skin infection infection  . Cholecalciferol (VITAMIN D PO) Take 10,000 Units by mouth daily.  . Cyanocobalamin (VITAMIN B-12 PO) Take 1 tablet by mouth daily.  . fenofibrate (TRICOR) 145 MG tablet TAKE 1 TABLET BY MOUTH ONCE DAILY FOR CHOLESEROL & BLOOD FATA  . fluticasone (FLONASE) 50  MCG/ACT nasal spray Use 1 to 2 sprays each Nares 1 to 2 x /day  . ipratropium (ATROVENT) 0.06 % nasal spray Use 1 to 2 sprays each nares 2 to 3 x /day for allergy drainage  . loratadine (CLARITIN) 10 MG tablet Take 1 tablet (10 mg total) by mouth daily.  . phentermine (ADIPEX-P) 37.5 MG tablet Take 1 tablet every morning for Dieting & Weight Loss  . pravastatin (PRAVACHOL) 20 MG tablet Take 1/2 to 1 tablet at night for Cholesterol  . pyridOXINE (VITAMIN B-6) 100 MG tablet Take 100 mg by mouth daily.  Marland Kitchen topiramate (TOPAMAX) 50 MG tablet Take 1 tablet 2 x /day at Supper & Bedtime for Weight Loss  . triamterene-hydrochlorothiazide (MAXZIDE-25) 37.5-25 MG tablet Take 1/2 to 1 tablet Daily for BP & Fluid Retention  . ciprofloxacin (CIPRO) 500 MG tablet Take 1 tablet 2 x /day with food for UTI  . mupirocin nasal ointment (BACTROBAN NASAL) 2 % Place 1 application into the nose 2 (two) times daily. Use one-half of tube in each nostril twice daily for five (5) days. After application, press sides of nose together and gently massage.   No current facility-administered medications on file prior to visit.      Allergies: No Known Allergies  Medical History:  Past Medical History:  Diagnosis Date  . Allergy   . Anxiety   . Bell palsy 02/04/2016   Left  . Cancer ([place]) 2011   BCC  . Hyperlipidemia   . Hypertension 2005  . Plantar fasciitis    Family history- Reviewed and unchanged Social history- Reviewed and unchanged   Review of Systems:  Review of Systems  Constitutional: Positive for diaphoresis and malaise/fatigue. Negative for chills, fever and weight loss.  HENT: Negative for congestion, hearing loss and sore throat.   Eyes: Negative for blurred vision and double vision.  Respiratory: Negative for cough, sputum production and shortness of breath.   Cardiovascular: Negative for chest pain, palpitations, orthopnea, leg swelling and PND.  Gastrointestinal: Positive for nausea. Negative  for abdominal pain, blood in stool, constipation, diarrhea, heartburn, melena and vomiting.  Genitourinary: Negative.   Musculoskeletal: Negative for back pain, joint pain, myalgias and neck pain.  Skin: Negative for rash.  Neurological: Negative for dizziness, sensory change, speech change and headaches.  Psychiatric/Behavioral: Negative for depression and substance abuse. The patient is not nervous/anxious and does not have insomnia.     Physical Exam: BP 138/88   Pulse 95   Temp (!) 97.3 F (36.3 C)   Ht 5\' 7"  (1.702 m)   Wt 158 lb 9.6 oz (71.9 kg)   SpO2 98%   BMI 24.84 kg/m  Wt Readings from Last 3 Encounters:  05/26/19 158 lb 9.6 oz (71.9 kg)  11/11/18 177 lb 3.2 oz (80.4 kg)  08/12/18 180 lb (81.6 kg)   General Appearance: Well nourished, in no apparent distress. Eyes: PERRLA, EOMs, conjunctiva no swelling or erythema Sinuses: No Frontal/maxillary tenderness ENT/Mouth: Ext aud canals clear, TMs without erythema, bulging. No erythema, swelling, or exudate on post pharynx.  Tonsils not swollen or erythematous. Hearing normal.  Neck: Supple, thyroid normal.  Respiratory: Respiratory effort normal, BS equal bilaterally without rales, rhonchi, wheezing or stridor.  Cardio: RRR with no MRGs. Brisk peripheral pulses without edema.  Abdomen: Soft, + BS.  Non tender, no guarding, rebound, hernias, masses. Lymphatics: Non tender without lymphadenopathy.  Musculoskeletal: Full ROM, 5/5 strength, Normal gait Skin: Warm, dry without rashes, lesions, ecchymosis.  Neuro: Cranial nerves intact. No cerebellar symptoms.  Psych: Awake and oriented X 3, normal affect, Insight and Judgment appropriate.    Izora Ribas, NP 9:34 AM [facility]

## 2019-05-26 ENCOUNTER — Ambulatory Visit: Payer: Self-pay | Admitting: Internal Medicine

## 2019-05-26 ENCOUNTER — Ambulatory Visit (INDEPENDENT_AMBULATORY_CARE_PROVIDER_SITE_OTHER): Payer: Medicare Other | Admitting: Adult Health

## 2019-05-26 ENCOUNTER — Encounter: Payer: Self-pay | Admitting: Adult Health

## 2019-05-26 ENCOUNTER — Other Ambulatory Visit: Payer: Self-pay

## 2019-05-26 VITALS — BP 138/88 | HR 95 | Temp 97.3°F | Ht 67.0 in | Wt 158.6 lb

## 2019-05-26 DIAGNOSIS — M109 Gout, unspecified: Secondary | ICD-10-CM | POA: Diagnosis not present

## 2019-05-26 DIAGNOSIS — E663 Overweight: Secondary | ICD-10-CM

## 2019-05-26 DIAGNOSIS — R61 Generalized hyperhidrosis: Secondary | ICD-10-CM

## 2019-05-26 DIAGNOSIS — E782 Mixed hyperlipidemia: Secondary | ICD-10-CM | POA: Diagnosis not present

## 2019-05-26 DIAGNOSIS — R7303 Prediabetes: Secondary | ICD-10-CM | POA: Diagnosis not present

## 2019-05-26 DIAGNOSIS — F419 Anxiety disorder, unspecified: Secondary | ICD-10-CM | POA: Diagnosis not present

## 2019-05-26 DIAGNOSIS — K760 Fatty (change of) liver, not elsewhere classified: Secondary | ICD-10-CM

## 2019-05-26 DIAGNOSIS — Z8249 Family history of ischemic heart disease and other diseases of the circulatory system: Secondary | ICD-10-CM

## 2019-05-26 DIAGNOSIS — R110 Nausea: Secondary | ICD-10-CM | POA: Diagnosis not present

## 2019-05-26 DIAGNOSIS — Z6827 Body mass index (BMI) 27.0-27.9, adult: Secondary | ICD-10-CM

## 2019-05-26 DIAGNOSIS — I10 Essential (primary) hypertension: Secondary | ICD-10-CM

## 2019-05-26 MED ORDER — ONDANSETRON HCL 4 MG PO TABS: 4.0000 mg | ORAL_TABLET | Freq: Three times a day (TID) | ORAL | 0 refills | Status: DC | PRN

## 2019-05-26 NOTE — Patient Instructions (Signed)
Heart Attack °The heart is a muscle that needs oxygen to survive. A heart attack is a condition that occurs when your heart does not get enough oxygen. When this happens, the heart muscle begins to die. This can cause permanent damage if not treated right away. A heart attack is a medical emergency. °This condition may be called a myocardial infarction, or MI. It is also known as acute coronary syndrome (ACS). ACS is a term used to describe a group of conditions that affect blood flow to the heart. °What are the causes? °This condition may be caused by: °· Atherosclerosis. This occurs when a fatty substance called plaque builds up in the arteries and blocks or reduces blood supply to the heart. °· A blood clot. A blood clot can develop suddenly when plaque breaks up within an artery and blocks blood flow to the heart. °· Low blood pressure. °· An abnormal heartbeat (arrhythmia). °· Conditions that cause a decrease of oxygen to the heart, such as anemiaorrespiratory failure. °· A spasm, or severe tightening, of a blood vessel that cuts off blood flow to the heart. °· Tearing of a coronary artery (spontaneous coronary artery dissection). °· High blood pressure. °What increases the risk? °The following factors may make you more likely to develop this condition: °· Aging. The older you are, the higher your risk. °· Having a personal or family history of chest pain, heart attack, stroke, or narrowing of the arteries in the legs, arms, head, or stomach (peripheral artery disease). °· Being female. °· Smoking. °· Not getting regular exercise. °· Being overweight or obese. °· Having high blood pressure. °· Having high cholesterol (hypercholesterolemia). °· Having diabetes. °· Drinking too much alcohol. °· Using illegal drugs, such as cocaine or methamphetamine. °What are the signs or symptoms? °Symptoms of this condition may vary, depending on factors like gender and age. Symptoms may include: °· Chest pain. It may feel  like: °? Crushing or squeezing. °? Tightness, pressure, fullness, or heaviness. °· Pain in the arm, neck, jaw, back, or upper body. °· Shortness of breath. °· Heartburn or upset stomach. °· Nausea. °· Sudden cold sweats. °· Feeling tired. °· Sudden light-headedness. °How is this diagnosed? °This condition may be diagnosed through tests, such as: °· Electrocardiogram (ECG) to measure the electrical activity of your heart. °· Blood tests to check for cardiac markers. These chemicals are released by a damaged heart muscle. °· A test to evaluate blood flow and heart function (coronary angiogram). °· CT scan to see the heart more clearly. °· A test to evaluate the pumping action of the heart (echocardiogram). °How is this treated? °A heart attack must be treated as soon as possible. Treatment may include: °· Medicines to: °? Break up or dissolve blood clots (fibrinolytic therapy). °? Thin blood and help prevent blood clots. °? Treat blood pressure. °? Improve blood flow to the heart. °? Reduce pain. °? Reduce cholesterol. °· Angioplasty and stent placement. These are procedures to widen a blocked artery and keep it open. °· Coronary artery bypass graft, CABG, or open heart surgery. This enables blood to flow to the heart by going around the blocked part of the artery. °· Oxygen therapy if needed. °· Cardiac rehabilitation. This improves your health and well-being through exercise, education, and counseling. °Follow these instructions at home: °Medicines °· Take over-the-counter and prescription medicines only as told by your health care provider. °· Do not take the following medicines unless your health care provider says it is okay   to take them: ? NSAIDs, such as ibuprofen. ? Supplements that contain vitamin A, vitamin E, or both. ? Hormone replacement therapy that contains estrogen with or without progestin. Lifestyle   Do not use any products that contain nicotine or tobacco, such as cigarettes, e-cigarettes,  and chewing tobacco. If you need help quitting, ask your health care provider.  Avoid secondhand smoke.  Exercise regularly. Ask your health care provider about participating in a cardiac rehabilitation program that helps you start exercising safely after a heart attack.  Eat a heart-healthy diet. Your health care provider will tell you what foods to eat.  Maintain a healthy weight.  Learn ways to manage stress.  Do not use illegal drugs. Alcohol use  Do not drink alcohol if: ? Your health care provider tells you not to drink. ? You are pregnant, may be pregnant, or are planning to become pregnant.  If you drink alcohol: ? Limit how much you use to:  0-1 drink a day for women.  0-2 drinks a day for men. ? Be aware of how much alcohol is in your drink. In the [place] one drink equals one 12 oz bottle of beer (355 mL), one 5 oz glass of wine (148 mL), or one 1 oz glass of hard liquor (44 mL). General instructions  Work with your health care provider to manage any other conditions you have, such as high blood pressure or diabetes. These conditions affect your heart.  Get screened for depression, and seek treatment if needed.  Keep your vaccinations up to date. Get the flu vaccine every year.  Keep all follow-up visits as told by your health care provider. This is important. Contact a health care provider if:  You feel overwhelmed or sad.  You have trouble doing your daily activities. Get help right away if:  You have sudden, unexplained discomfort in your chest, arms, back, neck, jaw, or upper body.  You have shortness of breath.  You suddenly start to sweat or your skin gets clammy.  You feel nauseous or you vomit.  You have unexplained tiredness or weakness.  You suddenly feel light-headed or dizzy.  You notice your heart starts to beat fast or feels like it is skipping beats.  You have blood pressure that is higher than 180/120. These symptoms may represent a  serious problem that is an emergency. Do not wait to see if the symptoms will go away. Get medical help right away. Call your local emergency services (911 in the [place] Do not drive yourself to the hospital. Summary  A heart attack, also called myocardial infarction, is a condition that occurs when your heart does not get enough oxygen. This is caused by anything that blocks or reduces blood flow to the heart.  Treatment is a combination of medicines and surgeries, if needed, to open the blocked arteries and restore blood flow to the heart.  A heart attack is an emergency. Get help right away if you have sudden discomfort in your chest, arms, back, neck, jaw, or upper body. Seek help if you feel nauseous, you vomit, or you feel light-headed or dizzy. This information is not intended to replace advice given to you by your health care provider. Make sure you discuss any questions you have with your health care provider. Document Released: 11/13/2005 Document Revised: 02/20/2019 Document Reviewed: 02/24/2019 Elsevier Patient Education  [place].     Nausea, Adult Nausea is the feeling that you have an upset stomach or that you  are about to vomit. Nausea on its own is not usually a serious concern, but it may be an early sign of a more serious medical problem. As nausea gets worse, it can lead to vomiting. If vomiting develops, or if you are not able to drink enough fluids, you are at risk of becoming dehydrated. Dehydration can make you tired and thirsty, cause you to have a dry mouth, and decrease how often you urinate. Older adults and people with other diseases or a weak disease-fighting system (immune system) are at higher risk for dehydration. The main goals of treating your nausea are:  To relieve your nausea.  To limit repeated nausea episodes.  To prevent vomiting and dehydration. Follow these instructions at home: Watch your symptoms for any changes. Tell your health care  provider about them. Follow these instructions as told by your health care provider. Eating and drinking      Take an oral rehydration solution (ORS). This is a drink that is sold at pharmacies and retail stores.  Drink clear fluids slowly and in small amounts as you are able. Clear fluids include water, ice chips, low-calorie sports drinks, and fruit juice that has water added (diluted fruit juice).  Eat bland, easy-to-digest foods in small amounts as you are able. These foods include bananas, applesauce, rice, lean meats, toast, and crackers.  Avoid drinking fluids that contain a lot of sugar or caffeine, such as energy drinks, sports drinks, and soda.  Avoid alcohol.  Avoid spicy or fatty foods. General instructions  Take over-the-counter and prescription medicines only as told by your health care provider.  Rest at home while you recover.  Drink enough fluid to keep your urine pale yellow.  Breathe slowly and deeply when you feel nauseous.  Avoid smelling things that have strong odors.  Wash your hands often using soap and water. If soap and water are not available, use hand sanitizer.  Make sure that all people in your household wash their hands well and often.  Keep all follow-up visits as told by your health care provider. This is important. Contact a health care provider if:  Your nausea gets worse.  Your nausea does not go away after two days.  You vomit.  You cannot drink fluids without vomiting.  You have any of the following: ? New symptoms. ? A fever. ? A headache. ? Muscle cramps. ? A rash. ? Pain while urinating.  You feel light-headed or dizzy. Get help right away if:  You have pain in your chest, neck, arm, or jaw.  You feel extremely weak or you faint.  You have vomit that is bright red or looks like coffee grounds.  You have bloody or black stools or stools that look like tar.  You have a severe headache, a stiff neck, or both.  You  have severe pain, cramping, or bloating in your abdomen.  You have difficulty breathing or are breathing very quickly.  Your heart is beating very quickly.  Your skin feels cold and clammy.  You feel confused.  You have signs of dehydration, such as: ? Dark urine, very little urine, or no urine. ? Cracked lips. ? Dry mouth. ? Sunken eyes. ? Sleepiness. ? Weakness. These symptoms may represent a serious problem that is an emergency. Do not wait to see if the symptoms will go away. Get medical help right away. Call your local emergency services (911 in the [place] Do not drive yourself to the hospital. Summary  Nausea is  the feeling that you have an upset stomach or that you are about to vomit. Nausea on its own is not usually a serious concern, but it may be an early sign of a more serious medical problem.  If vomiting develops, or if you are not able to drink enough fluids, you are at risk of becoming dehydrated.  Follow recommendations for eating and drinking and take over-the-counter and prescription medicines only as told by your health care provider.  Contact a health care provider right away if your symptoms worsen or you have new symptoms.  Keep all follow-up visits as told by your health care provider. This is important. This information is not intended to replace advice given to you by your health care provider. Make sure you discuss any questions you have with your health care provider. Document Released: 12/21/2004 Document Revised: 04/23/2018 Document Reviewed: 04/23/2018 Elsevier Patient Education  [place].    Ondansetron tablets What is this medicine? ONDANSETRON (on DAN se tron) is used to treat nausea and vomiting caused by chemotherapy. It is also used to prevent or treat nausea and vomiting after surgery. This medicine may be used for other purposes; ask your health care provider or pharmacist if you have questions. COMMON BRAND NAME(S): Zofran What  should I tell my health care provider before I take this medicine? They need to know if you have any of these conditions:  heart disease  history of irregular heartbeat  liver disease  low levels of magnesium or potassium in the blood  an unusual or allergic reaction to ondansetron, granisetron, other medicines, foods, dyes, or preservatives  pregnant or trying to get pregnant  breast-feeding How should I use this medicine? Take this medicine by mouth with a glass of water. Follow the directions on your prescription label. Take your doses at regular intervals. Do not take your medicine more often than directed. Talk to your pediatrician regarding the use of this medicine in children. Special care may be needed. Overdosage: If you think you have taken too much of this medicine contact a poison control center or emergency room at once. NOTE: This medicine is only for you. Do not share this medicine with others. What if I miss a dose? If you miss a dose, take it as soon as you can. If it is almost time for your next dose, take only that dose. Do not take double or extra doses. What may interact with this medicine? Do not take this medicine with any of the following medications:  apomorphine  certain medicines for fungal infections like fluconazole, itraconazole, ketoconazole, posaconazole, voriconazole  cisapride  dronedarone  pimozide  thioridazine This medicine may also interact with the following medications:  carbamazepine  certain medicines for depression, anxiety, or psychotic disturbances  fentanyl  linezolid  MAOIs like Carbex, Eldepryl, Marplan, Nardil, and Parnate  methylene blue (injected into a vein)  other medicines that prolong the QT interval (cause an abnormal heart rhythm) like dofetilide, ziprasidone  phenytoin  rifampicin  tramadol This list may not describe all possible interactions. Give your health care provider a list of all the medicines,  herbs, non-prescription drugs, or dietary supplements you use. Also tell them if you smoke, drink alcohol, or use illegal drugs. Some items may interact with your medicine. What should I watch for while using this medicine? Check with your doctor or health care professional right away if you have any sign of an allergic reaction. What side effects may I notice from receiving  this medicine? Side effects that you should report to your doctor or health care professional as soon as possible:  allergic reactions like skin rash, itching or hives, swelling of the face, lips or tongue  breathing problems  confusion  dizziness  fast or irregular heartbeat  feeling faint or lightheaded, falls  fever and chills  loss of balance or coordination  seizures  sweating  swelling of the hands or feet  tightness in the chest  tremors  unusually weak or tired Side effects that usually do not require medical attention (report to your doctor or health care professional if they continue or are bothersome):  constipation or diarrhea  headache This list may not describe all possible side effects. Call your doctor for medical advice about side effects. You may report side effects to FDA at 1-800-FDA-1088. Where should I keep my medicine? Keep out of the reach of children. Store between 2 and 30 degrees C (36 and 86 degrees F). Throw away any unused medicine after the expiration date. NOTE: This sheet is a summary. It may not cover all possible information. If you have questions about this medicine, talk to your doctor, pharmacist, or health care provider.  2020 Elsevier/Gold Standard (2018-11-05 07:16:43)

## 2019-05-27 ENCOUNTER — Other Ambulatory Visit: Payer: Self-pay | Admitting: *Deleted

## 2019-05-27 LAB — LIPID PANEL
Cholesterol: 146 mg/dL (ref <200)
HDL: 49 mg/dL — ABNORMAL LOW (ref >=50)
LDL Cholesterol (Calc): 77 mg/dL (calc)
Non-HDL Cholesterol (Calc): 97 mg/dL (calc) (ref <130)
Total CHOL/HDL Ratio: 3 (calc) (ref <5.0)
Triglycerides: 112 mg/dL (ref <150)

## 2019-05-27 LAB — COMPLETE METABOLIC PANEL WITH GFR
AG Ratio: 2.1 (calc) (ref 1.0–2.5)
ALT: 19 U/L (ref 6–29)
AST: 21 U/L (ref 10–35)
Albumin: 4.5 g/dL (ref 3.6–5.1)
Alkaline phosphatase (APISO): 44 U/L (ref 37–153)
BUN/Creatinine Ratio: 20 (calc) (ref 6–22)
BUN: 20 mg/dL (ref 7–25)
CO2: 32 mmol/L (ref 20–32)
Calcium: 10.2 mg/dL (ref 8.6–10.4)
Chloride: 102 mmol/L (ref 98–110)
Creat: 1 mg/dL — ABNORMAL HIGH (ref 0.50–0.99)
GFR, Est African American: 67 mL/min/{1.73_m2} (ref >=60)
GFR, Est Non African American: 58 mL/min/{1.73_m2} — ABNORMAL LOW (ref >=60)
Globulin: 2.1 g/dL (calc) (ref 1.9–3.7)
Glucose, Bld: 88 mg/dL (ref 65–99)
Potassium: 3.6 mmol/L (ref 3.5–5.3)
Sodium: 141 mmol/L (ref 135–146)
Total Bilirubin: 0.5 mg/dL (ref 0.2–1.2)
Total Protein: 6.6 g/dL (ref 6.1–8.1)

## 2019-05-27 LAB — CBC WITH DIFFERENTIAL/PLATELET
Absolute Monocytes: 332 cells/uL (ref 200–950)
Basophils Absolute: 21 cells/uL (ref 0–200)
Basophils Relative: 0.5 %
Eosinophils Absolute: 131 cells/uL (ref 15–500)
Eosinophils Relative: 3.2 %
HCT: 40.7 % (ref 35.0–45.0)
Hemoglobin: 14.2 g/dL (ref 11.7–15.5)
Lymphs Abs: 1689 cells/uL (ref 850–3900)
MCH: 31.1 pg (ref 27.0–33.0)
MCHC: 34.9 g/dL (ref 32.0–36.0)
MCV: 89.3 fL (ref 80.0–100.0)
MPV: 9.1 fL (ref 7.5–12.5)
Monocytes Relative: 8.1 %
Neutro Abs: 1927 cells/uL (ref 1500–7800)
Neutrophils Relative %: 47 %
Platelets: 217 10*3/uL (ref 140–400)
RBC: 4.56 10*6/uL (ref 3.80–5.10)
RDW: 12.3 % (ref 11.0–15.0)
Total Lymphocyte: 41.2 %
WBC: 4.1 10*3/uL (ref 3.8–10.8)

## 2019-05-27 LAB — MAGNESIUM: Magnesium: 2 mg/dL (ref 1.5–2.5)

## 2019-05-27 LAB — TSH: TSH: 1 mIU/L (ref 0.40–4.50)

## 2019-05-27 LAB — TROPONIN I: Troponin I: <0.01 ng/mL (ref <=0.0)

## 2019-05-27 MED ORDER — FLUTICASONE PROPIONATE 50 MCG/ACT NA SUSP: NASAL | 3 refills | Status: DC

## 2019-06-26 ENCOUNTER — Other Ambulatory Visit: Payer: Self-pay | Admitting: Internal Medicine

## 2019-06-26 MED ORDER — AMOXICILLIN 250 MG PO CAPS: ORAL_CAPSULE | ORAL | 0 refills | Status: DC

## 2019-06-28 ENCOUNTER — Other Ambulatory Visit: Payer: Self-pay | Admitting: Internal Medicine

## 2019-06-28 MED ORDER — CIPROFLOXACIN HCL 250 MG PO TABS: ORAL_TABLET | ORAL | 0 refills | Status: DC

## 2019-07-08 DIAGNOSIS — L821 Other seborrheic keratosis: Secondary | ICD-10-CM | POA: Diagnosis not present

## 2019-07-08 DIAGNOSIS — L988 Other specified disorders of the skin and subcutaneous tissue: Secondary | ICD-10-CM | POA: Diagnosis not present

## 2019-07-08 DIAGNOSIS — D692 Other nonthrombocytopenic purpura: Secondary | ICD-10-CM | POA: Diagnosis not present

## 2019-07-08 DIAGNOSIS — L820 Inflamed seborrheic keratosis: Secondary | ICD-10-CM | POA: Diagnosis not present

## 2019-07-09 DIAGNOSIS — J310 Chronic rhinitis: Secondary | ICD-10-CM | POA: Diagnosis not present

## 2019-07-09 DIAGNOSIS — J3489 Other specified disorders of nose and nasal sinuses: Secondary | ICD-10-CM | POA: Diagnosis not present

## 2019-07-13 ENCOUNTER — Other Ambulatory Visit: Payer: Self-pay | Admitting: Internal Medicine

## 2019-07-13 MED ORDER — PHENTERMINE HCL 37.5 MG PO TABS: ORAL_TABLET | ORAL | 1 refills | Status: DC

## 2019-07-14 ENCOUNTER — Other Ambulatory Visit: Payer: Self-pay | Admitting: Internal Medicine

## 2019-07-14 MED ORDER — PHENTERMINE HCL 37.5 MG PO TABS: ORAL_TABLET | ORAL | 1 refills | Status: DC

## 2019-07-29 ENCOUNTER — Ambulatory Visit (INDEPENDENT_AMBULATORY_CARE_PROVIDER_SITE_OTHER): Payer: Medicare Other | Admitting: *Deleted

## 2019-07-29 ENCOUNTER — Other Ambulatory Visit: Payer: Self-pay

## 2019-07-29 VITALS — BP 110/76 | HR 80 | Temp 97.4°F | Resp 16 | Ht 67.0 in | Wt 162.8 lb

## 2019-07-29 DIAGNOSIS — N390 Urinary tract infection, site not specified: Secondary | ICD-10-CM | POA: Diagnosis not present

## 2019-07-29 NOTE — Progress Notes (Signed)
Patient here for a NV to check a U/A and urine culture. The patient has finished the Cipro, but states she still has pressure and urinates frequently.

## 2019-07-31 ENCOUNTER — Other Ambulatory Visit: Payer: Self-pay | Admitting: Internal Medicine

## 2019-07-31 DIAGNOSIS — E782 Mixed hyperlipidemia: Secondary | ICD-10-CM

## 2019-07-31 LAB — URINALYSIS, ROUTINE W REFLEX MICROSCOPIC
Bilirubin Urine: NEGATIVE
Glucose, UA: NEGATIVE
Hgb urine dipstick: NEGATIVE
Ketones, ur: NEGATIVE
Leukocytes,Ua: NEGATIVE
Nitrite: NEGATIVE
Protein, ur: NEGATIVE
Specific Gravity, Urine: 1.016 (ref 1.001–1.03)
pH: 7 (ref 5.0–8.0)

## 2019-07-31 LAB — URINE CULTURE
MICRO NUMBER:: 835364
Result:: NO GROWTH
SPECIMEN QUALITY:: ADEQUATE

## 2019-08-18 DIAGNOSIS — I8393 Asymptomatic varicose veins of bilateral lower extremities: Secondary | ICD-10-CM | POA: Diagnosis not present

## 2019-08-18 DIAGNOSIS — L820 Inflamed seborrheic keratosis: Secondary | ICD-10-CM | POA: Diagnosis not present

## 2019-08-18 DIAGNOSIS — L821 Other seborrheic keratosis: Secondary | ICD-10-CM | POA: Diagnosis not present

## 2019-08-18 DIAGNOSIS — L814 Other melanin hyperpigmentation: Secondary | ICD-10-CM | POA: Diagnosis not present

## 2019-08-18 DIAGNOSIS — L578 Other skin changes due to chronic exposure to nonionizing radiation: Secondary | ICD-10-CM | POA: Diagnosis not present

## 2019-08-26 DIAGNOSIS — Z23 Encounter for immunization: Secondary | ICD-10-CM | POA: Diagnosis not present

## 2019-08-28 ENCOUNTER — Ambulatory Visit: Payer: Self-pay | Admitting: Adult Health

## 2019-09-01 ENCOUNTER — Ambulatory Visit: Payer: Self-pay | Admitting: Adult Health

## 2019-09-04 ENCOUNTER — Other Ambulatory Visit: Payer: Self-pay | Admitting: *Deleted

## 2019-09-04 DIAGNOSIS — M109 Gout, unspecified: Secondary | ICD-10-CM

## 2019-09-04 MED ORDER — ALLOPURINOL 300 MG PO TABS: ORAL_TABLET | ORAL | 1 refills | Status: DC

## 2019-09-20 ENCOUNTER — Encounter (INDEPENDENT_AMBULATORY_CARE_PROVIDER_SITE_OTHER): Payer: Self-pay

## 2019-10-01 DIAGNOSIS — J310 Chronic rhinitis: Secondary | ICD-10-CM | POA: Diagnosis not present

## 2019-10-03 DIAGNOSIS — H1013 Acute atopic conjunctivitis, bilateral: Secondary | ICD-10-CM | POA: Diagnosis not present

## 2019-10-06 DIAGNOSIS — L821 Other seborrheic keratosis: Secondary | ICD-10-CM | POA: Diagnosis not present

## 2019-10-06 DIAGNOSIS — L578 Other skin changes due to chronic exposure to nonionizing radiation: Secondary | ICD-10-CM | POA: Diagnosis not present

## 2019-10-06 DIAGNOSIS — L820 Inflamed seborrheic keratosis: Secondary | ICD-10-CM | POA: Diagnosis not present

## 2019-10-14 DIAGNOSIS — J301 Allergic rhinitis due to pollen: Secondary | ICD-10-CM | POA: Diagnosis not present

## 2019-10-15 DIAGNOSIS — J301 Allergic rhinitis due to pollen: Secondary | ICD-10-CM | POA: Diagnosis not present

## 2019-10-20 ENCOUNTER — Other Ambulatory Visit: Payer: Self-pay | Admitting: Internal Medicine

## 2019-10-20 ENCOUNTER — Other Ambulatory Visit: Payer: Self-pay

## 2019-10-20 DIAGNOSIS — Z20822 Contact with and (suspected) exposure to covid-19: Secondary | ICD-10-CM

## 2019-10-20 DIAGNOSIS — Z20828 Contact with and (suspected) exposure to other viral communicable diseases: Secondary | ICD-10-CM | POA: Diagnosis not present

## 2019-10-20 MED ORDER — PREDNISONE 20 MG PO TABS: ORAL_TABLET | ORAL | 0 refills | Status: DC

## 2019-10-21 ENCOUNTER — Other Ambulatory Visit: Payer: Self-pay

## 2019-10-22 ENCOUNTER — Telehealth: Payer: Self-pay | Admitting: *Deleted

## 2019-10-22 LAB — NOVEL CORONAVIRUS, NAA: SARS-CoV-2, NAA: DETECTED — AB

## 2019-10-22 NOTE — Telephone Encounter (Signed)
Patient is calling to make sure that she is clear on her isolation. She is so upset over her diagnosis and Thanksgiving holiday. Advised CDC criteria and retesting informaiom given per CDC recommendation. Patient questions answered and she was satisfied.

## 2019-10-24 ENCOUNTER — Other Ambulatory Visit: Payer: Self-pay | Admitting: Internal Medicine

## 2019-10-24 DIAGNOSIS — J0140 Acute pansinusitis, unspecified: Secondary | ICD-10-CM

## 2019-10-24 DIAGNOSIS — J0410 Acute tracheitis without obstruction: Secondary | ICD-10-CM

## 2019-10-24 MED ORDER — BENZONATATE 200 MG PO CAPS: ORAL_CAPSULE | ORAL | 1 refills | Status: DC

## 2019-10-24 MED ORDER — AZITHROMYCIN 250 MG PO TABS: ORAL_TABLET | ORAL | 1 refills | Status: DC

## 2019-10-27 ENCOUNTER — Emergency Department
Admission: EM | Admit: 2019-10-27 | Discharge: 2019-10-27 | Disposition: A | Discharge status: Home / Self Care | Payer: Medicare Other | Attending: Emergency Medicine | Admitting: Emergency Medicine

## 2019-10-27 ENCOUNTER — Other Ambulatory Visit: Payer: Self-pay

## 2019-10-27 ENCOUNTER — Encounter: Payer: Self-pay | Admitting: Emergency Medicine

## 2019-10-27 ENCOUNTER — Other Ambulatory Visit: Payer: Self-pay | Admitting: Internal Medicine

## 2019-10-27 ENCOUNTER — Emergency Department: Payer: Medicare Other

## 2019-10-27 DIAGNOSIS — G510 Bell's palsy: Secondary | ICD-10-CM | POA: Insufficient documentation

## 2019-10-27 DIAGNOSIS — I10 Essential (primary) hypertension: Secondary | ICD-10-CM | POA: Insufficient documentation

## 2019-10-27 DIAGNOSIS — U071 COVID-19: Secondary | ICD-10-CM | POA: Diagnosis not present

## 2019-10-27 DIAGNOSIS — Z79899 Other long term (current) drug therapy: Secondary | ICD-10-CM | POA: Diagnosis not present

## 2019-10-27 DIAGNOSIS — J1282 Pneumonia due to coronavirus disease 2019: Secondary | ICD-10-CM

## 2019-10-27 DIAGNOSIS — R05 Cough: Secondary | ICD-10-CM | POA: Diagnosis not present

## 2019-10-27 DIAGNOSIS — J1289 Other viral pneumonia: Secondary | ICD-10-CM | POA: Diagnosis not present

## 2019-10-27 LAB — BASIC METABOLIC PANEL
Anion gap: 13 (ref 5–15)
BUN: 17 mg/dL (ref 8–23)
CO2: 25 mmol/L (ref 22–32)
Calcium: 8.9 mg/dL (ref 8.9–10.3)
Chloride: 100 mmol/L (ref 98–111)
Creatinine, Ser: 0.78 mg/dL (ref 0.44–1.00)
GFR calc Af Amer: >60 mL/min (ref >60)
GFR calc non Af Amer: >60 mL/min (ref >60)
Glucose, Bld: 117 mg/dL — ABNORMAL HIGH (ref 70–99)
Potassium: 3.1 mmol/L — ABNORMAL LOW (ref 3.5–5.1)
Sodium: 138 mmol/L (ref 135–145)

## 2019-10-27 LAB — CBC
HCT: 44.7 % (ref 36.0–46.0)
Hemoglobin: 14.6 g/dL (ref 12.0–15.0)
MCH: 28.3 pg (ref 26.0–34.0)
MCHC: 32.7 g/dL (ref 30.0–36.0)
MCV: 86.8 fL (ref 80.0–100.0)
Platelets: 200 10*3/uL (ref 150–400)
RBC: 5.15 MIL/uL — ABNORMAL HIGH (ref 3.87–5.11)
RDW: 12.6 % (ref 11.5–15.5)
WBC: 6.9 10*3/uL (ref 4.0–10.5)
nRBC: 0 % (ref 0.0–0.2)

## 2019-10-27 MED ORDER — KETOROLAC TROMETHAMINE 30 MG/ML IJ SOLN
30.0000 mg | Freq: Once | INTRAMUSCULAR | Status: AC
  Administered 2019-10-27: 12:00:00 30 mg via INTRAMUSCULAR
  Filled 2019-10-27: qty 1

## 2019-10-27 MED ORDER — SODIUM CHLORIDE 0.9% FLUSH: 3.0000 mL | Freq: Once | INTRAVENOUS | Status: DC

## 2019-10-27 MED ORDER — TOPIRAMATE 50 MG PO TABS: ORAL_TABLET | ORAL | 1 refills | Status: DC

## 2019-10-27 NOTE — ED Notes (Signed)
Pt alert and oriented X4, in NAD at this time

## 2019-10-27 NOTE — ED Provider Notes (Signed)
[facility] Emergency Department Provider Note   ____________________________________________    I have reviewed the triage vital signs and the nursing notes.   HISTORY  Chief Complaint COVID-19    HPI [name] is a 68 y.o. female who was diagnosed with COVID-19 approximately 5 days ago.  She presents with complaints of cough, severe body aches, continuous headache.  Mild shortness of breath.  Positive chills.  Her PCP put her on prednisone and Z-Pak with little improvement.  Has not taken anything today.  No nausea or vomiting or abdominal pain  Past Medical History:  Diagnosis Date  . Allergy   . Anxiety   . Bell palsy 02/04/2016   Left  . Cancer ([place]) 2011   BCC  . Hyperlipidemia   . Hypertension 2005  . Plantar fasciitis     Patient Active Problem List   Diagnosis Date Noted  . Family history of cardiac disorder in mother 05/26/2019  . Fatty liver (2005 by [place] 09/23/2017  . Prediabetes 05/19/2016  . Overweight (BMI 25.0-29.9) 05/19/2016  . Gout of big toe 05/19/2016  . History of Bell's palsy 02/04/2016  . Hyperlipidemia   . Environmental allergies   . Anxiety   . Hypertension, benign essential, goal below 140/90 11/10/2013    Past Surgical History:  Procedure Laterality Date  . ABDOMINAL HYSTERECTOMY  1990  . basal cell carcinoma  2011   nose  . TENDON REPAIR     plantar fasciitis  . TONSILLECTOMY      Prior to Admission medications   Medication Sig Start Date End Date Taking? Authorizing Provider  allopurinol (ZYLOPRIM) 300 MG tablet Take 1 tablet Daily to Prevent Gout 09/04/19   Unk Pinto, MD  benzonatate (TESSALON) 200 MG capsule Take 1 perle 3 x / day to prevent cough (Dx J04.10) 10/24/19   Unk Pinto, MD  Biotin 10 MG TABS Take by mouth daily.    [provider]  calcium carbonate (OSCAL) 1500 (600 Ca) MG TABS tablet Take by mouth daily with breakfast.    [provider]  cephALEXin  (KEFLEX) 500 MG capsule Take 1 capsule 4 x /day after meals & bedtime with food  For skin infection infection 03/28/19   Unk Pinto, MD  Cholecalciferol (VITAMIN D PO) Take 10,000 Units by mouth daily.    [provider]  ciprofloxacin (CIPRO) 250 MG tablet Take 1 tablet 2 x /day with meal for Infection 06/28/19   Unk Pinto, MD  Cyanocobalamin (VITAMIN B-12 PO) Take 1 tablet by mouth daily.    [provider]  fenofibrate (TRICOR) 145 MG tablet TAKE 1 TABLET BY MOUTH ONCE DAILY FOR CHOLESTEROL AND BLOOD FATA 07/31/19   Liane Comber, NP  fluticasone [facility]) 50 MCG/ACT nasal spray Use 1 to 2 sprays each Nares 1 to 2 x /day 05/27/19   Unk Pinto, MD  ipratropium (ATROVENT) 0.06 % nasal spray Use 1 to 2 sprays each nares 2 to 3 x /day for allergy drainage 02/15/19   Unk Pinto, MD  loratadine (CLARITIN) 10 MG tablet Take 1 tablet (10 mg total) by mouth daily. 03/03/19 03/02/20  Vicie Mutters, PA-C  mupirocin nasal ointment (BACTROBAN NASAL) 2 % Place 1 application into the nose 2 (two) times daily. Use one-half of tube in each nostril twice daily for five (5) days. After application, press sides of nose together and gently massage. 03/01/19   Unk Pinto, MD  ondansetron (ZOFRAN) 4 MG tablet Take 1 tablet (  4 mg total) by mouth every 8 (eight) hours as needed for nausea or vomiting. 05/26/19   Liane Comber, NP  phentermine (ADIPEX-P) 37.5 MG tablet Take 1 tablet every Morning for Dieting & Weight Loss 07/14/19   Unk Pinto, MD  pravastatin (PRAVACHOL) 20 MG tablet Take 1/2 to 1 tablet at night for Cholesterol 03/27/18 05/26/19  Unk Pinto, MD  predniSONE (DELTASONE) 20 MG tablet 1 tab 3 x day for 2 days, then 1 tab 2 x day for 2 days, then 1 tab 1 x day for 3 days 10/20/19   Unk Pinto, MD  pyridOXINE (VITAMIN B-6) 100 MG tablet Take 100 mg by mouth daily.    [provider]  topiramate (TOPAMAX) 50 MG tablet Take 1 tablet 2 x /day at Supper  & Bedtime for Weight Loss 05/05/19   Unk Pinto, MD  triamterene-hydrochlorothiazide [facility]) 37.5-25 MG tablet Take 1/2 to 1 tablet Daily for BP & Fluid Retention 03/15/19   Unk Pinto, MD     Allergies Patient has no known allergies.  Family History  Problem Relation Age of Onset  . Hypertension Mother   . Heart attack Mother   . Hyperlipidemia Mother   . Heart disease Mother 71       fatal MI  . Stroke Mother   . Arthritis Mother        rheumatoid  . Hypertension Brother   . Cancer Brother        throat, leukemia  . Parkinson's disease Brother   . Cancer Father        lung    Social History Social History   Tobacco Use  . Smoking status: Never Smoker  . Smokeless tobacco: Never Used  Substance Use Topics  . Alcohol use: Not on file  . Drug use: Not on file    Review of Systems  Constitutional: As above Eyes: No visual changes.  ENT: No sore throat. Cardiovascular: Denies chest pain. Respiratory: As above Gastrointestinal: As above Genitourinary: Negative for dysuria. Musculoskeletal: Positive myalgias Skin: Negative for rash. Neurological: Negative for weakness   ____________________________________________   PHYSICAL EXAM:  VITAL SIGNS: ED Triage Vitals  Enc Vitals Group     BP 10/27/19 0916 132/61     Pulse Rate 10/27/19 0916 (!) 101     Resp 10/27/19 0916 20     Temp 10/27/19 0916 98.6 F (37 C)     Temp Source 10/27/19 0916 Oral     SpO2 10/27/19 0918 98 %     Weight --      Height --      Head Circumference --      Peak Flow --      Pain Score 10/27/19 0919 8     Pain Loc --      Pain Edu? --      Excl. in [place]? --     Constitutional: Alert and oriented.  Eyes: Conjunctivae are normal.   Nose: No congestion/rhinnorhea. Mouth/Throat: Mucous membranes are moist.    Cardiovascular: Normal rate, regular rhythm.   Good peripheral circulation. Respiratory: Normal respiratory effort.  No retractions. Gastrointestinal: Soft  and nontender. No distention.    Musculoskeletal: .  Warm and well perfused Neurologic:  Normal speech and language. No gross focal neurologic deficits are appreciated.  Skin:  Skin is warm, dry and intact. No rash noted. Psychiatric: Mood and affect are normal. Speech and behavior are normal.  ____________________________________________   LABS (all labs ordered are listed, but only  abnormal results are displayed)  Labs Reviewed  BASIC METABOLIC PANEL - Abnormal; Notable for the following components:      Result Value   Potassium 3.1 (*)    Glucose, Bld 117 (*)    All other components within normal limits  CBC - Abnormal; Notable for the following components:   RBC 5.15 (*)    All other components within normal limits   ____________________________________________  EKG  ED ECG REPORT I, Lavonia Drafts, the attending physician, personally viewed and interpreted this ECG.  Date: 10/27/2019  Rhythm: normal sinus rhythm QRS Axis: normal Intervals: normal ST/T Wave abnormalities: normal Narrative Interpretation: no evidence of acute ischemia  ____________________________________________  RADIOLOGY  Chest x-ray demonstrates bilateral atypical pneumonia ____________________________________________   PROCEDURES  Procedure(s) performed: No  Procedures   Critical Care performed: No ____________________________________________   INITIAL IMPRESSION / ASSESSMENT AND PLAN / ED COURSE  Pertinent labs & imaging results that were available during my care of the patient were reviewed by me and considered in my medical decision making (see chart for details).  Patient with known COVID-19 presents with complaints of body aches, headache, cough.  Her vital signs are reassuring, oxygen saturation is 96% or greater, no tachypnea.  Recommended antipyretics and pain control, home pulse oximeter.  Return precautions discussed    ____________________________________________    FINAL CLINICAL IMPRESSION(S) / ED DIAGNOSES  Final diagnoses:  Pneumonia due to COVID-19 virus        Note:  This document was prepared using Dragon voice recognition software and may include unintentional dictation errors.   Lavonia Drafts, MD 10/27/19 [phone]

## 2019-10-27 NOTE — ED Triage Notes (Signed)
Pt states she tested positive for covid last Wed, states she isn't getting any better, cough, pain in back with deep breath. NAD. Finished prednisone yesterday, and is on Zpack.

## 2019-10-30 ENCOUNTER — Other Ambulatory Visit: Payer: Self-pay | Admitting: Internal Medicine

## 2019-10-30 MED ORDER — ONDANSETRON HCL 8 MG PO TABS: ORAL_TABLET | ORAL | 1 refills | Status: DC

## 2019-11-12 ENCOUNTER — Encounter: Payer: Self-pay | Admitting: Internal Medicine

## 2019-11-12 DIAGNOSIS — N6320 Unspecified lump in the left breast, unspecified quadrant: Secondary | ICD-10-CM | POA: Diagnosis not present

## 2019-11-13 ENCOUNTER — Other Ambulatory Visit: Payer: Self-pay

## 2019-11-13 DIAGNOSIS — D0512 Intraductal carcinoma in situ of left breast: Secondary | ICD-10-CM | POA: Diagnosis not present

## 2019-11-13 DIAGNOSIS — N6459 Other signs and symptoms in breast: Secondary | ICD-10-CM | POA: Diagnosis not present

## 2019-11-14 ENCOUNTER — Telehealth: Payer: Self-pay | Admitting: *Deleted

## 2019-11-14 NOTE — Telephone Encounter (Signed)
Confirmed [facility] for 11/19/19 at 0815 .  Instructions and contact information given.

## 2019-11-17 ENCOUNTER — Other Ambulatory Visit: Payer: Self-pay | Admitting: Internal Medicine

## 2019-11-17 ENCOUNTER — Other Ambulatory Visit: Payer: Self-pay | Admitting: *Deleted

## 2019-11-17 DIAGNOSIS — D0512 Intraductal carcinoma in situ of left breast: Secondary | ICD-10-CM | POA: Insufficient documentation

## 2019-11-17 MED ORDER — PRAVASTATIN SODIUM 20 MG PO TABS: ORAL_TABLET | ORAL | 2 refills | Status: DC

## 2019-11-18 NOTE — Progress Notes (Signed)
Radiation Oncology         [phone]) [phone] ________________________________  Initial outpatient Consultation in person  Name: [name] MRN: [id number]  Date: 11/19/2019  DOB: May 20, 1951  CC:Unk Pinto, MD  Alphonsa Overall, MD   REFERRING PHYSICIAN: Alphonsa Overall, MD  DIAGNOSIS:    ICD-10-CM   1. Ductal carcinoma in situ (DCIS) of left breast  D05.12    Stage 0 Left Breast UIQ DCIS, ER+ / PR+, Grade 2   CHIEF COMPLAINT: Here to discuss management of left breast DCIS  HISTORY OF PRESENT ILLNESS::[name] is a 68 y.o. female who presented with breast abnormality on the following imaging: screening mammogram on the date of 11/10/2019.  Symptoms, if any, at that time, were: none.   Ultrasound of breast on 11/12/2019 revealed 4 mm oval mass in the left breast at 10 o'clock.   Biopsy on date of 11/13/2019 showed DCIS.  ER status:+; PR status +; Grade intermediate.  She lives close to [facility] and is interested in receiving treatment closer to home if she needs radiotherapy.  She was diagnosed a 3-4 weeks ago with Covid 19.  She had pneumonia related to this diagnosis.  PREVIOUS RADIATION THERAPY: No  PAST MEDICAL HISTORY:  has a past medical history of Allergy, Anxiety, Bell palsy (02/04/2016), Cancer ([place]) (2011), Hyperlipidemia, Hypertension (2005), and Plantar fasciitis.    PAST SURGICAL HISTORY: Past Surgical History:  Procedure Laterality Date  . ABDOMINAL HYSTERECTOMY  1990  . basal cell carcinoma  2011   nose  . TENDON REPAIR     plantar fasciitis  . TONSILLECTOMY      FAMILY HISTORY: family history includes Arthritis in her mother; Cancer in her brother and father; Heart attack in her mother; Heart disease (age of onset: 80) in her mother; Hyperlipidemia in her mother; Hypertension in her brother and mother; Parkinson's disease in her brother; Stroke in her mother.  SOCIAL HISTORY:  reports that she has never smoked. She has never used  smokeless tobacco. She reports current alcohol use.  ALLERGIES: Patient has no known allergies.  MEDICATIONS:  Current Outpatient Medications  Medication Sig Dispense Refill  . allopurinol (ZYLOPRIM) 300 MG tablet Take 1 tablet Daily to Prevent Gout (Patient taking differently: Take 150 mg by mouth daily. Take 1 tablet Daily to Prevent Gout) 90 tablet 1  . Biotin 10 MG TABS Take by mouth daily.    . calcium carbonate (OSCAL) 1500 (600 Ca) MG TABS tablet Take by mouth daily with breakfast.    . Cholecalciferol (VITAMIN D PO) Take 10,000 Units by mouth daily.    . Cyanocobalamin (VITAMIN B-12 PO) Take 1 tablet by mouth daily.    . fenofibrate (TRICOR) 145 MG tablet TAKE 1 TABLET BY MOUTH ONCE DAILY FOR CHOLESTEROL AND BLOOD FATA 90 tablet 0  . fluticasone (FLONASE) 50 MCG/ACT nasal spray Use 1 to 2 sprays each Nares 1 to 2 x /day 48 g 3  . ipratropium (ATROVENT) 0.06 % nasal spray Use 1 to 2 sprays each nares 2 to 3 x /day for allergy drainage 45 mL 3  . loratadine (CLARITIN) 10 MG tablet Take 1 tablet (10 mg total) by mouth daily. 90 tablet 1  . ondansetron (ZOFRAN) 8 MG tablet Take 1 tablet 3 x /day for Nausea 30 tablet 1  . pravastatin (PRAVACHOL) 20 MG tablet Take  1 tablet at Bedtime for Cholesterol 90 tablet 2  . pyridOXINE (VITAMIN B-6) 100 MG tablet Take 100 mg  by mouth daily.    Marland Kitchen triamterene-hydrochlorothiazide (MAXZIDE-25) 37.5-25 MG tablet Take 1/2 to 1 tablet Daily for BP & Fluid Retention 90 tablet 3   No current facility-administered medications for this encounter.    REVIEW OF SYSTEMS: As above.    PHYSICAL EXAM:  vitals were not taken for this visit.   General: Alert and oriented, in no acute distress Psychiatric: Judgment and insight are intact. Affect is appropriate. Breasts: Breast exam deferred today.    ECOG = 1  0 - Asymptomatic (Fully active, able to carry on all predisease activities without restriction)  1 - Symptomatic but completely ambulatory (Restricted  in physically strenuous activity but ambulatory and able to carry out work of a light or sedentary nature. For example, light housework, office work)  2 - Symptomatic, <50% in bed during the day (Ambulatory and capable of all self care but unable to carry out any work activities. Up and about more than 50% of waking hours)  3 - Symptomatic, >50% in bed, but not bedbound (Capable of only limited self-care, confined to bed or chair 50% or more of waking hours)  4 - Bedbound (Completely disabled. Cannot carry on any self-care. Totally confined to bed or chair)  5 - Death   Eustace Pen MM, Creech RH, Tormey DC, et al. [phone]). "Toxicity and response criteria of the [facility]". [place] [facility] [place] Oncol. 5 (6): 649-55   LABORATORY DATA:  Lab Results  Component Value Date   WBC 5.8 11/19/2019   HGB 13.0 11/19/2019   HCT 40.3 11/19/2019   MCV 87.6 11/19/2019   PLT 224 11/19/2019   CMP     Component Value Date/Time   NA 143 11/19/2019 0819   K 3.8 11/19/2019 0819   CL 104 11/19/2019 0819   CO2 31 11/19/2019 0819   GLUCOSE 105 (H) 11/19/2019 0819   BUN 17 11/19/2019 0819   CREATININE 0.91 11/19/2019 0819   CREATININE 1.00 (H) 05/26/2019 1003   CALCIUM 10.0 11/19/2019 0819   PROT 7.0 11/19/2019 0819   ALBUMIN 3.9 11/19/2019 0819   AST 19 11/19/2019 0819   ALT 14 11/19/2019 0819   ALKPHOS 76 11/19/2019 0819   BILITOT 0.4 11/19/2019 0819   GFRNONAA >60 11/19/2019 0819   GFRNONAA 58 (L) 05/26/2019 1003   GFRAA >60 11/19/2019 0819   GFRAA 67 05/26/2019 1003         RADIOGRAPHY: DG Chest 2 View  Result Date: 10/27/2019 CLINICAL DATA:  68 year old female with history of cough. Tested positive for COVID-19 last week. EXAM: CHEST - 2 VIEW COMPARISON:  Chest x-ray 12/12/2004. FINDINGS: Patchy ill-defined opacities in the right mid to lower lung and left lung base concerning for multilobar bronchopneumonia. No pneumothorax. No pleural effusions. No evidence of pulmonary  edema. Heart size is normal. Upper mediastinal contours are within normal limits. IMPRESSION: 1. Findings concerning for multilobar bilateral bronchopneumonia, as above, likely secondary to reported COVID-19 infection. Electronically Signed   By: Vinnie Langton M.D.   On: 10/27/2019 10:10      IMPRESSION/PLAN: Left Breast DCIS   It was a pleasure meeting the patient today. We discussed the risks, benefits, and side effects of radiotherapy. I recommend radiotherapy to the left breast to reduce her risk of locoregional recurrence by 1/2.  We discussed my preferred technique would be hypofractionated radiation over 4 weeks and that I would give the patient a few weeks to heal following surgery before starting treatment planning. We spoke about acute effects  including skin irritation and fatigue as well as much less common late effects including internal organ injury or irritation. We spoke about the latest technology that is used to minimize the risk of late effects for patients undergoing radiotherapy to the breast or chest wall.    The patient is enthusiastic about proceeding with treatment.  She lives close to [facility].  She is interested in receiving treatment there.  Our nurse navigators will make a referral.  I wished her the very best.     __________________________________________   Eppie Gibson, MD   This document serves as a record of services personally performed by Eppie Gibson, MD. It was created on her behalf by Wilburn Mylar, a trained medical scribe. The creation of this record is based on the scribe's personal observations and the provider's statements to them. This document has been checked and approved by the attending provider.

## 2019-11-18 NOTE — Progress Notes (Signed)
[place] NOTE  Patient Care Team: Unk Pinto, MD as PCP - General (Internal Medicine) Avon Gully, NP as Nurse Practitioner (Obstetrics and Gynecology) Clarene Essex, MD as Consulting Physician (Gastroenterology) Aplington, Laurice Record, MD (Inactive) as Consulting Physician (Orthopedic Surgery) Suella Broad, MD as Consulting Physician (Physical Medicine and Rehabilitation) Jari Pigg, MD as Consulting Physician (Dermatology) Nicholas Lose, MD as Consulting Physician (Hematology and Oncology) Alphonsa Overall, MD as Consulting Physician (General Surgery) Eppie Gibson, MD as Attending Physician (Radiation Oncology) Mauro Kaufmann, RN as Oncology Nurse Navigator Rockwell Germany, RN as Oncology Nurse Navigator  CHIEF COMPLAINTS/PURPOSE OF CONSULTATION:  Newly diagnosed breast cancer  HISTORY OF PRESENTING ILLNESS:  [name] 68 y.o. female is here because of recent diagnosis of left breast DCIS. The cancer was detected on a routine screening mammogram on 11/10/19. Diagnostic mammogram and [place] on 11/12/19 showed an indeterminate 0.5cm mass in the left breast. Biopsy on 11/13/19 showed ductal carcinoma in situ. She presents to the clinic today for initial evaluation and discussion of treatment options.   I reviewed her records extensively and collaborated the history with the patient.  SUMMARY OF ONCOLOGIC HISTORY: Oncology History  Ductal carcinoma in situ (DCIS) of left breast  11/17/2019 Initial Diagnosis   Screening mammogram detected left breast density UOQ 5 mm at 10 o'clock position.  Stereotactic biopsy revealed intermediate grade DCIS ER/PR positive   11/19/2019 Cancer Staging   Staging form: Breast, AJCC 8th Edition - Clinical stage from 11/19/2019: Stage 0 (cTis (DCIS), cN0, cM0, G2, ER+, PR+, HER2: Not Assessed) - Signed by Nicholas Lose, MD on 11/19/2019     MEDICAL HISTORY:  Past Medical History:  Diagnosis Date  . Allergy   .  Anxiety   . Bell palsy 02/04/2016   Left  . Cancer ([place]) 2011   BCC  . Hyperlipidemia   . Hypertension 2005  . Plantar fasciitis     SURGICAL HISTORY: Past Surgical History:  Procedure Laterality Date  . ABDOMINAL HYSTERECTOMY  1990  . basal cell carcinoma  2011   nose  . TENDON REPAIR     plantar fasciitis  . TONSILLECTOMY      SOCIAL HISTORY: Social History   Socioeconomic History  . Marital status: Married    Spouse name: Not on file  . Number of children: Not on file  . Years of education: Not on file  . Highest education level: Not on file  Occupational History  . Not on file  Tobacco Use  . Smoking status: Never Smoker  . Smokeless tobacco: Never Used  Substance and Sexual Activity  . Alcohol use: Yes  . Drug use: Not on file  . Sexual activity: Not on file  Other Topics Concern  . Not on file  Social History Narrative  . Not on file   Social Determinants of Health   Financial Resource Strain:   . Difficulty of Paying Living Expenses: Not on file  Food Insecurity:   . Worried About Charity fundraiser in the Last Year: Not on file  . Ran Out of Food in the Last Year: Not on file  Transportation Needs:   . Lack of Transportation (Medical): Not on file  . Lack of Transportation (Non-Medical): Not on file  Physical Activity:   . Days of Exercise per Week: Not on file  . Minutes of Exercise per Session: Not on file  Stress:   . Feeling of Stress : Not on file  Social  Connections:   . Frequency of Communication with Friends and Family: Not on file  . Frequency of Social Gatherings with Friends and Family: Not on file  . Attends Religious Services: Not on file  . Active Member of Clubs or Organizations: Not on file  . Attends Archivist Meetings: Not on file  . Marital Status: Not on file  Intimate Partner Violence:   . Fear of Current or Ex-Partner: Not on file  . Emotionally Abused: Not on file  . Physically Abused: Not on file  .  Sexually Abused: Not on file    FAMILY HISTORY: Family History  Problem Relation Age of Onset  . Hypertension Mother   . Heart attack Mother   . Hyperlipidemia Mother   . Heart disease Mother 14       fatal MI  . Stroke Mother   . Arthritis Mother        rheumatoid  . Hypertension Brother   . Cancer Brother        throat, leukemia  . Parkinson's disease Brother   . Cancer Father        lung    ALLERGIES:  has No Known Allergies.  MEDICATIONS:  Current Outpatient Medications  Medication Sig Dispense Refill  . allopurinol (ZYLOPRIM) 300 MG tablet Take 1 tablet Daily to Prevent Gout (Patient taking differently: Take 150 mg by mouth daily. Take 1 tablet Daily to Prevent Gout) 90 tablet 1  . Biotin 10 MG TABS Take by mouth daily.    . calcium carbonate (OSCAL) 1500 (600 Ca) MG TABS tablet Take by mouth daily with breakfast.    . Cholecalciferol (VITAMIN D PO) Take 10,000 Units by mouth daily.    . Cyanocobalamin (VITAMIN B-12 PO) Take 1 tablet by mouth daily.    . fenofibrate (TRICOR) 145 MG tablet TAKE 1 TABLET BY MOUTH ONCE DAILY FOR CHOLESTEROL AND BLOOD FATA 90 tablet 0  . fluticasone (FLONASE) 50 MCG/ACT nasal spray Use 1 to 2 sprays each Nares 1 to 2 x /day 48 g 3  . loratadine (CLARITIN) 10 MG tablet Take 1 tablet (10 mg total) by mouth daily. 90 tablet 1  . pravastatin (PRAVACHOL) 20 MG tablet Take  1 tablet at Bedtime for Cholesterol 90 tablet 2  . pyridOXINE (VITAMIN B-6) 100 MG tablet Take 100 mg by mouth daily.    Marland Kitchen triamterene-hydrochlorothiazide (MAXZIDE-25) 37.5-25 MG tablet Take 1/2 to 1 tablet Daily for BP & Fluid Retention 90 tablet 3  . ipratropium (ATROVENT) 0.06 % nasal spray Use 1 to 2 sprays each nares 2 to 3 x /day for allergy drainage 45 mL 3  . ondansetron (ZOFRAN) 8 MG tablet Take 1 tablet 3 x /day for Nausea 30 tablet 1   No current facility-administered medications for this visit.    REVIEW OF SYSTEMS:   Constitutional: Denies fevers, chills or  abnormal night sweats Eyes: Denies blurriness of vision, double vision or watery eyes Ears, nose, mouth, throat, and face: Denies mucositis or sore throat Respiratory: Denies cough, dyspnea or wheezes Cardiovascular: Denies palpitation, chest discomfort or lower extremity swelling Gastrointestinal:  Denies nausea, heartburn or change in bowel habits Skin: Denies abnormal skin rashes Lymphatics: Denies new lymphadenopathy or easy bruising Neurological:Denies numbness, tingling or new weaknesses Behavioral/Psych: Mood is stable, no new changes  Breast: Denies any palpable lumps or discharge All other systems were reviewed with the patient and are negative.  PHYSICAL EXAMINATION: ECOG PERFORMANCE STATUS: 1 - Symptomatic but completely ambulatory  Vitals:   11/19/19 0850  BP: 132/71  Pulse: 89  Resp: 18  Temp: 98.3 F (36.8 C)  SpO2: 99%   Filed Weights   11/19/19 0850  Weight: 154 lb 9.6 oz (70.1 kg)    GENERAL:alert, no distress and comfortable SKIN: skin color, texture, turgor are normal, no rashes or significant lesions EYES: normal, conjunctiva are pink and non-injected, sclera clear OROPHARYNX:no exudate, no erythema and lips, buccal mucosa, and tongue normal  NECK: supple, thyroid normal size, non-tender, without nodularity LYMPH:  no palpable lymphadenopathy in the cervical, axillary or inguinal LUNGS: clear to auscultation and percussion with normal breathing effort HEART: regular rate & rhythm and no murmurs and no lower extremity edema ABDOMEN:abdomen soft, non-tender and normal bowel sounds Musculoskeletal:no cyanosis of digits and no clubbing  PSYCH: alert & oriented x 3 with fluent speech NEURO: no focal motor/sensory deficits BREAST: No palpable nodules in breast. No palpable axillary or supraclavicular lymphadenopathy (exam performed in the presence of a chaperone)   LABORATORY DATA:  I have reviewed the data as listed Lab Results  Component Value Date    WBC 5.8 11/19/2019   HGB 13.0 11/19/2019   HCT 40.3 11/19/2019   MCV 87.6 11/19/2019   PLT 224 11/19/2019   Lab Results  Component Value Date   NA 143 11/19/2019   K 3.8 11/19/2019   CL 104 11/19/2019   CO2 31 11/19/2019    RADIOGRAPHIC STUDIES: I have personally reviewed the radiological reports and agreed with the findings in the report.  ASSESSMENT AND PLAN:  Ductal carcinoma in situ (DCIS) of left breast December 2020:Screening mammogram detected left breast density UOQ 5 mm at 10 o'clock position.  Stereotactic biopsy revealed intermediate grade DCIS ER/PR positive  Pathology review: I discussed with the patient the difference between DCIS and invasive breast cancer. It is considered a precancerous lesion. DCIS is classified as a 0. It is generally detected through mammograms as calcifications. We discussed the significance of grades and its impact on prognosis. We also discussed the importance of ER and PR receptors and their implications to adjuvant treatment options. Prognosis of DCIS dependence on grade, comedo necrosis. It is anticipated that if not treated, 20-30% of DCIS can develop into invasive breast cancer.  Recommendation: 1. Breast conserving surgery 2. Followed by adjuvant radiation therapy 3. Followed by antiestrogen therapy with tamoxifen 5 years  Tamoxifen counseling: We discussed the risks and benefits of tamoxifen. These include but not limited to insomnia, hot flashes, mood changes, vaginal dryness, and weight gain. Although rare, serious side effects including endometrial cancer, risk of blood clots were also discussed. We strongly believe that the benefits far outweigh the risks. Patient understands these risks and consented to starting treatment. Planned treatment duration is 5 years.  Return to clinic after surgery to discuss the final pathology report and come up with an adjuvant treatment plan.     All questions were answered. The patient knows to  call the clinic with any problems, questions or concerns.   Rulon Eisenmenger, MD, MPH 11/19/2019    I, Molly Dorshimer, am acting as scribe for Nicholas Lose, MD.  I have reviewed the above documentation for accuracy and completeness, and I agree with the above.

## 2019-11-19 ENCOUNTER — Other Ambulatory Visit: Payer: Self-pay | Admitting: Surgery

## 2019-11-19 ENCOUNTER — Inpatient Hospital Stay: Payer: Medicare Other

## 2019-11-19 ENCOUNTER — Ambulatory Visit: Payer: Medicare Other | Admitting: Physical Therapy

## 2019-11-19 ENCOUNTER — Ambulatory Visit
Admission: RE | Admit: 2019-11-19 | Discharge: 2019-11-19 | Disposition: A | Discharge status: Home / Self Care | Payer: Medicare Other | Source: Ambulatory Visit | Attending: Radiation Oncology | Admitting: Radiation Oncology

## 2019-11-19 ENCOUNTER — Inpatient Hospital Stay: Payer: Medicare Other | Attending: Hematology and Oncology | Admitting: Hematology and Oncology

## 2019-11-19 ENCOUNTER — Encounter: Payer: Self-pay | Admitting: Hematology and Oncology

## 2019-11-19 DIAGNOSIS — I10 Essential (primary) hypertension: Secondary | ICD-10-CM | POA: Diagnosis not present

## 2019-11-19 DIAGNOSIS — D0512 Intraductal carcinoma in situ of left breast: Secondary | ICD-10-CM

## 2019-11-19 DIAGNOSIS — E785 Hyperlipidemia, unspecified: Secondary | ICD-10-CM | POA: Diagnosis not present

## 2019-11-19 DIAGNOSIS — C50212 Malignant neoplasm of upper-inner quadrant of left female breast: Secondary | ICD-10-CM

## 2019-11-19 DIAGNOSIS — Z170 Estrogen receptor positive status [ER+]: Secondary | ICD-10-CM | POA: Insufficient documentation

## 2019-11-19 LAB — CMP (CANCER CENTER ONLY)
ALT: 14 U/L (ref 0–44)
AST: 19 U/L (ref 15–41)
Albumin: 3.9 g/dL (ref 3.5–5.0)
Alkaline Phosphatase: 76 U/L (ref 38–126)
Anion gap: 8 (ref 5–15)
BUN: 17 mg/dL (ref 8–23)
CO2: 31 mmol/L (ref 22–32)
Calcium: 10 mg/dL (ref 8.9–10.3)
Chloride: 104 mmol/L (ref 98–111)
Creatinine: 0.91 mg/dL (ref 0.44–1.00)
GFR, Est AFR Am: >60 mL/min (ref >60)
GFR, Estimated: >60 mL/min (ref >60)
Glucose, Bld: 105 mg/dL — ABNORMAL HIGH (ref 70–99)
Potassium: 3.8 mmol/L (ref 3.5–5.1)
Sodium: 143 mmol/L (ref 135–145)
Total Bilirubin: 0.4 mg/dL (ref 0.3–1.2)
Total Protein: 7 g/dL (ref 6.5–8.1)

## 2019-11-19 LAB — CBC WITH DIFFERENTIAL (CANCER CENTER ONLY)
Abs Immature Granulocytes: 0.02 10*3/uL (ref 0.00–0.07)
Basophils Absolute: 0 10*3/uL (ref 0.0–0.1)
Basophils Relative: 1 %
Eosinophils Absolute: 0.2 10*3/uL (ref 0.0–0.5)
Eosinophils Relative: 4 %
HCT: 40.3 % (ref 36.0–46.0)
Hemoglobin: 13 g/dL (ref 12.0–15.0)
Immature Granulocytes: 0 %
Lymphocytes Relative: 42 %
Lymphs Abs: 2.5 10*3/uL (ref 0.7–4.0)
MCH: 28.3 pg (ref 26.0–34.0)
MCHC: 32.3 g/dL (ref 30.0–36.0)
MCV: 87.6 fL (ref 80.0–100.0)
Monocytes Absolute: 0.5 10*3/uL (ref 0.1–1.0)
Monocytes Relative: 8 %
Neutro Abs: 2.6 10*3/uL (ref 1.7–7.7)
Neutrophils Relative %: 45 %
Platelet Count: 224 10*3/uL (ref 150–400)
RBC: 4.6 MIL/uL (ref 3.87–5.11)
RDW: 13 % (ref 11.5–15.5)
WBC Count: 5.8 10*3/uL (ref 4.0–10.5)
nRBC: 0 % (ref 0.0–0.2)

## 2019-11-19 LAB — GENETIC SCREENING ORDER

## 2019-11-19 NOTE — Assessment & Plan Note (Signed)
December 2020:Screening mammogram detected left breast density UOQ 5 mm at 10 o'clock position.  Stereotactic biopsy revealed intermediate grade DCIS ER/PR positive  Pathology review: I discussed with the patient the difference between DCIS and invasive breast cancer. It is considered a precancerous lesion. DCIS is classified as a 0. It is generally detected through mammograms as calcifications. We discussed the significance of grades and its impact on prognosis. We also discussed the importance of ER and PR receptors and their implications to adjuvant treatment options. Prognosis of DCIS dependence on grade, comedo necrosis. It is anticipated that if not treated, 20-30% of DCIS can develop into invasive breast cancer.  Recommendation: 1. Breast conserving surgery 2. Followed by adjuvant radiation therapy 3. Followed by antiestrogen therapy with tamoxifen 5 years  Tamoxifen counseling: We discussed the risks and benefits of tamoxifen. These include but not limited to insomnia, hot flashes, mood changes, vaginal dryness, and weight gain. Although rare, serious side effects including endometrial cancer, risk of blood clots were also discussed. We strongly believe that the benefits far outweigh the risks. Patient understands these risks and consented to starting treatment. Planned treatment duration is 5 years.  Return to clinic after surgery to discuss the final pathology report and come up with an adjuvant treatment plan.

## 2019-11-20 ENCOUNTER — Encounter: Payer: Self-pay | Admitting: Radiation Oncology

## 2019-11-25 ENCOUNTER — Telehealth: Payer: Self-pay | Admitting: *Deleted

## 2019-11-25 ENCOUNTER — Other Ambulatory Visit: Payer: Self-pay | Admitting: *Deleted

## 2019-11-25 DIAGNOSIS — D0512 Intraductal carcinoma in situ of left breast: Secondary | ICD-10-CM

## 2019-11-25 NOTE — Telephone Encounter (Signed)
Spoke to pt concerning [place] from 11/19/19. Denies questions or concerns regarding dx or treatment care plan. Encourage pt to call with needs. Received verbal understanding.

## 2019-11-26 ENCOUNTER — Encounter: Payer: Self-pay | Admitting: Internal Medicine

## 2019-11-26 ENCOUNTER — Telehealth: Payer: Self-pay

## 2019-11-26 ENCOUNTER — Telehealth: Payer: Self-pay | Admitting: Hematology and Oncology

## 2019-11-26 NOTE — Patient Instructions (Signed)

## 2019-11-26 NOTE — Telephone Encounter (Signed)
Scheduled appt per 12/29 sch message- unable to reach pt . Left message with appt date and time   

## 2019-11-26 NOTE — Telephone Encounter (Signed)
Nutrition Assessment  Reason for Assessment:  Pt attended Breast Clinic on 11/19/2019 and was given nutrition packet by nurse navigator.  ASSESSMENT:  68 year old female with new diagnosis of left breast cancer.  Planning lumpectomy on 1/8, followed by radiation and antiestrogens.    Spoke with patient via phone to introduce self and service at [facility].  Patient reports that appetite has not been good recently due to no taste and smell with recent Covid.  She reports that she is still eating.  Medications:  reviewed  Labs: glucose 105  Anthropometrics:   Height: 67 inches Weight: 154 lb 9.6 oz BMI: 24   NUTRITION DIAGNOSIS: Food and nutrition related knowledge deficit related to new diagnosis of breast cancer as evidenced by no prior need for nutrition related information.  INTERVENTION:   Discussed briefly packet of information regarding nutritional tips for breast cancer patients.  No questions at this time.   Contact information provided and patient knows to contact me with questions/concerns.    MONITORING, EVALUATION, and GOAL: Pt will consume a healthy plant based diet to maintain lean body mass throughout treatment.   Dovid Bartko B. Zenia Resides, [place], [place] Registered Dietitian [phone] (pager)

## 2019-11-26 NOTE — Progress Notes (Signed)
Comprehensive Evaluation &  Examination     This very nice 68 y.o. MWF  presents for a  comprehensive evaluation and management of multiple medical co-morbidities.  Patient has been followed for HTN, HLD, T2_NIDDM  Prediabetes  and Vitamin D Deficiency. Patient has hx/o Gout controlled on her Allopurinol. Patient was dx'd with Covid -19 on Nov 23/2020 and has since recovered her U/RI  sx's.      Also, patient was recently dx'd with DCIS Lt breast on 11/13/2019. Patient is scheduled for Lt breast Lumpectomy on Jan 8 by Dr Lucia Gaskins.       HTN predates circa 1998. Patient's BP has been controlled at home and patient denies any cardiac symptoms as chest pain, palpitations, shortness of breath, dizziness or ankle swelling. Today's BP is slightly elevated at 150/82.      Patient's hyperlipidemia (2005)  is controlled with diet and medications. Patient denies myalgias or other medication SE's. Last lipids are at goal:  Lab Results  Component Value Date   CHOL 148 11/27/2019   HDL 51 11/27/2019   LDLCALC 78 11/27/2019   TRIG 109 11/27/2019   CHOLHDL 2.9 11/27/2019      Patient has hx/o prediabetes (A1c 5.9% / 2012 &5.7% / 2015)and patient denies reactive hypoglycemic symptoms, visual blurring, diabetic polys or paresthesias. Current  A1c is not at goal:  Lab Results  Component Value Date   HGBA1C 5.8 (H) 11/27/2019      Finally, patient has history of Vitamin D Deficiency("33" / 2018)  and current Vitamin D was at goal:  Lab Results  Component Value Date   VD25OH 79 11/27/2019   Current Outpatient Medications on File Prior to Visit  Medication Sig  . allopurinol (ZYLOPRIM) 300 MG tablet Take 1 tablet Daily to Prevent Gout (Patient taking differently: Take 150 mg by mouth daily. Take 1 tablet Daily to Prevent Gout)  . Ascorbic Acid (VITAMIN C) 1000 MG tablet Take 1,000 mg by mouth daily.  . calcium carbonate (OSCAL) 1500 (600 Ca) MG TABS tablet Take by mouth daily with breakfast.  .  Cholecalciferol (VITAMIN D PO) Take 10,000 Units by mouth daily.  . Cyanocobalamin (VITAMIN B-12 PO) Take 1 tablet by mouth daily.  . fenofibrate (TRICOR) 145 MG tablet TAKE 1 TABLET BY MOUTH ONCE DAILY FOR CHOLESTEROL AND BLOOD FATA  . fluticasone (FLONASE) 50 MCG/ACT nasal spray Use 1 to 2 sprays each Nares 1 to 2 x /day  . loratadine (CLARITIN) 10 MG tablet Take 1 tablet (10 mg total) by mouth daily.  . pravastatin (PRAVACHOL) 20 MG tablet Take  1 tablet at Bedtime for Cholesterol  . pyridOXINE (VITAMIN B-6) 100 MG tablet Take 100 mg by mouth daily.  Marland Kitchen triamterene-hydrochlorothiazide (MAXZIDE-25) 37.5-25 MG tablet Take 1/2 to 1 tablet Daily for BP & Fluid Retention  . zinc gluconate 50 MG tablet Take 50 mg by mouth daily.  . ondansetron (ZOFRAN) 8 MG tablet Take 1 tablet 3 x /day for Nausea   No current facility-administered medications on file prior to visit.   No Known Allergies   Past Medical History:  Diagnosis Date  . Allergy   . Anxiety   . Bell palsy 02/04/2016   Left  . Cancer ([place]) 2011   BCC  . Hyperlipidemia   . Hypertension 2005  . Plantar fasciitis    Health Maintenance  Topic Date Due  . DEXA SCAN  12/16/2015  . MAMMOGRAM  10/19/2019  . TETANUS/TDAP  11/28/2019  . COLONOSCOPY  03/27/2021  . PNA vac Low Risk Adult  Completed   Immunization History  Administered Date(s) Administered  . Influenza,inj,quad, With Preservative 08/27/2017  . Influenza-Unspecified 10/02/2018, 10/27/2019  . PPD Test 05/14/2014  . Pneumococcal Polysaccharide-23 02/08/2017   - Patient had Colonoscopies in 2006 & 2011 w/ an adenomatous polyp ([place]).  - Last Colon  - 03/27/2016 - Dr Watt Climes - recc 5 yr f/u due May 2022  Last MGM - Abn  12/14 & 11/12/2019  Past Surgical History:  Procedure Laterality Date  . ABDOMINAL HYSTERECTOMY  1990  . basal cell carcinoma  2011   nose  . TENDON REPAIR     plantar fasciitis  . TONSILLECTOMY     Family History  Problem Relation Age of Onset   . Hypertension Mother   . Heart attack Mother   . Hyperlipidemia Mother   . Heart disease Mother 49       fatal MI  . Stroke Mother   . Arthritis Mother        rheumatoid  . Hypertension Brother   . Cancer Brother        throat, leukemia  . Parkinson's disease Brother   . Cancer Father        lung   Social History   Tobacco Use  . Smoking status: Never Smoker  . Smokeless tobacco: Never Used  Substance Use Topics  . Alcohol use: Yes  . Drug use: Never    ROS Constitutional: Denies fever, chills, weight loss/gain, headaches, insomnia,  night sweats, and change in appetite. Does c/o fatigue. Eyes: Denies redness, blurred vision, diplopia, discharge, itchy, watery eyes.  ENT: Denies discharge, congestion, post nasal drip, epistaxis, sore throat, earache, hearing loss, dental pain, Tinnitus, Vertigo, Sinus pain, snoring.  Cardio: Denies chest pain, palpitations, irregular heartbeat, syncope, dyspnea, diaphoresis, orthopnea, PND, claudication, edema Respiratory: denies cough, dyspnea, DOE, pleurisy, hoarseness, laryngitis, wheezing.  Gastrointestinal: Denies dysphagia, heartburn, reflux, water brash, pain, cramps, nausea, vomiting, bloating, diarrhea, constipation, hematemesis, melena, hematochezia, jaundice, hemorrhoids Genitourinary: Denies  nocturia, hesitancy, discharge, hematuria, flank pain. Recent dysuria, frequency &  urgency. Breast: Breast lumps, nipple discharge, bleeding.  Musculoskeletal: Denies arthralgia, myalgia, stiffness, Jt. Swelling, pain, limp, and strain/sprain. Denies falls. Skin: Denies puritis, rash, hives, warts, acne, eczema, changing in skin lesion Neuro: No weakness, tremor, incoordination, spasms, paresthesia, pain Psychiatric: Denies confusion, memory loss, sensory loss. Denies Depression. Endocrine: Denies change in weight, skin, hair change, nocturia, and paresthesia, diabetic polys, visual blurring, hyper / hypo glycemic episodes.  Heme/Lymph: No  excessive bleeding, bruising, enlarged lymph nodes.  Physical Exam  BP (!) 150/82   Pulse 80   Temp 97.6 F (36.4 C)   Resp 16   Ht 5\' 7"  (1.702 m)   Wt 153 lb 9.6 oz (69.7 kg)   BMI 24.06 kg/m   General Appearance: Well nourished, well groomed and in no apparent distress.  Eyes: PERRLA, EOMs, conjunctiva no swelling or erythema, normal fundi and vessels. Sinuses: No frontal/maxillary tenderness ENT/Mouth: EACs patent / TMs  nl. Nares clear without erythema, swelling, mucoid exudates. Oral hygiene is good. No erythema, swelling, or exudate. Tongue normal, non-obstructing. Tonsils not swollen or erythematous. Hearing normal.  Neck: Supple, thyroid not palpable. No bruits, nodes or JVD. Respiratory: Respiratory effort normal.  BS equal and clear bilateral without rales, rhonci, wheezing or stridor. Cardio: Heart sounds are normal with regular rate and rhythm and no murmurs, rubs or gallops. Peripheral pulses are normal and equal bilaterally without edema. No aortic or  femoral bruits. Chest: symmetric with normal excursions and percussion. Breasts: deferred  Abdomen: Flat, soft with bowel sounds active. Nontender, no guarding, rebound, hernias, masses, or organomegaly.  Lymphatics: Non tender without lymphadenopathy.  Musculoskeletal: Full ROM all peripheral extremities, joint stability, 5/5 strength, and normal gait. Skin: Warm and dry without rashes, lesions, cyanosis, clubbing or  ecchymosis.  Neuro: Cranial nerves intact, reflexes equal bilaterally. Normal muscle tone, no cerebellar symptoms. Sensation intact.  Pysch: Alert and oriented X 3, normal affect, Insight and Judgment appropriate.   Assessment and Plan  1. Essential hypertension  - EKG 12-Lead - Urinalysis, Routine w reflex microscopic - Microalbumin / Creatinine Urine Ratio - Magnesium - TSH  2. Hyperlipidemia, mixed  - EKG 12-Lead - Lipid Profile - TSH  3. Abnormal glucose  - EKG 12-Lead - Hemoglobin A1c  ([facility]) - Insulin, random  4. Vitamin D deficiency  - Vitamin D (25 hydroxy)  5. Gout   - Uric acid  6. Screening for colorectal cancer  - POC Hemoccult Bld/Stl   7. Screening for ischemic heart disease  - EKG 12-Lead  8. FHx: heart disease  - EKG 12-Lead  9. Urinary tract infection   - Culture, Urine  10. Medication management  - Urinalysis, Routine w reflex microscopic - Microalbumin / Creatinine Urine Ratio - Uric acid - Magnesium - Lipid Profile - TSH - Hemoglobin A1c ([facility]) - Insulin, random - Vitamin D (25 hydroxy)       Patient was counseled in prudent diet to achieve/maintain BMI less than 25 for weight control, BP monitoring, regular exercise and medications. Discussed med's effects and SE's. Screening labs and tests as requested with regular follow-up as recommended. Over 40 minutes of exam, counseling, chart review and high complex critical decision making was performed.   Kirtland Bouchard, MD

## 2019-11-27 ENCOUNTER — Other Ambulatory Visit: Payer: Self-pay

## 2019-11-27 ENCOUNTER — Ambulatory Visit (INDEPENDENT_AMBULATORY_CARE_PROVIDER_SITE_OTHER): Payer: Medicare Other | Admitting: Internal Medicine

## 2019-11-27 ENCOUNTER — Encounter (HOSPITAL_BASED_OUTPATIENT_CLINIC_OR_DEPARTMENT_OTHER): Payer: Self-pay | Admitting: Surgery

## 2019-11-27 ENCOUNTER — Encounter: Payer: Self-pay | Admitting: *Deleted

## 2019-11-27 VITALS — BP 150/82 | HR 80 | Temp 97.6°F | Resp 16 | Ht 67.0 in | Wt 153.6 lb

## 2019-11-27 DIAGNOSIS — R7309 Other abnormal glucose: Secondary | ICD-10-CM | POA: Diagnosis not present

## 2019-11-27 DIAGNOSIS — N390 Urinary tract infection, site not specified: Secondary | ICD-10-CM

## 2019-11-27 DIAGNOSIS — Z1212 Encounter for screening for malignant neoplasm of rectum: Secondary | ICD-10-CM

## 2019-11-27 DIAGNOSIS — Z1211 Encounter for screening for malignant neoplasm of colon: Secondary | ICD-10-CM

## 2019-11-27 DIAGNOSIS — E559 Vitamin D deficiency, unspecified: Secondary | ICD-10-CM | POA: Diagnosis not present

## 2019-11-27 DIAGNOSIS — M1A9XX0 Chronic gout, unspecified, without tophus (tophi): Secondary | ICD-10-CM

## 2019-11-27 DIAGNOSIS — Z136 Encounter for screening for cardiovascular disorders: Secondary | ICD-10-CM | POA: Diagnosis not present

## 2019-11-27 DIAGNOSIS — E782 Mixed hyperlipidemia: Secondary | ICD-10-CM | POA: Diagnosis not present

## 2019-11-27 DIAGNOSIS — I10 Essential (primary) hypertension: Secondary | ICD-10-CM | POA: Diagnosis not present

## 2019-11-27 DIAGNOSIS — R7303 Prediabetes: Secondary | ICD-10-CM

## 2019-11-27 DIAGNOSIS — Z79899 Other long term (current) drug therapy: Secondary | ICD-10-CM

## 2019-11-27 DIAGNOSIS — Z8249 Family history of ischemic heart disease and other diseases of the circulatory system: Secondary | ICD-10-CM

## 2019-11-27 MED ORDER — ALPRAZOLAM 0.5 MG PO TABS: ORAL_TABLET | ORAL | 0 refills | Status: DC

## 2019-11-27 MED ORDER — PHENTERMINE HCL 37.5 MG PO TABS: ORAL_TABLET | ORAL | 1 refills | Status: DC

## 2019-11-30 ENCOUNTER — Other Ambulatory Visit: Payer: Self-pay | Admitting: Internal Medicine

## 2019-11-30 DIAGNOSIS — N3000 Acute cystitis without hematuria: Secondary | ICD-10-CM

## 2019-11-30 MED ORDER — NITROFURANTOIN MONOHYD MACRO 100 MG PO CAPS: ORAL_CAPSULE | ORAL | 0 refills | Status: DC

## 2019-12-01 DIAGNOSIS — M7062 Trochanteric bursitis, left hip: Secondary | ICD-10-CM | POA: Diagnosis not present

## 2019-12-01 DIAGNOSIS — M7542 Impingement syndrome of left shoulder: Secondary | ICD-10-CM | POA: Diagnosis not present

## 2019-12-01 LAB — URINALYSIS, ROUTINE W REFLEX MICROSCOPIC
Bilirubin Urine: NEGATIVE
Glucose, UA: NEGATIVE
Hgb urine dipstick: NEGATIVE
Hyaline Cast: NONE SEEN /LPF
Ketones, ur: NEGATIVE
Nitrite: NEGATIVE
Protein, ur: NEGATIVE
RBC / HPF: NONE SEEN /HPF (ref 0–2)
Specific Gravity, Urine: 1.022 (ref 1.001–1.03)
Squamous Epithelial / HPF: NONE SEEN /HPF (ref <=5)
pH: <=5 (ref 5.0–8.0)

## 2019-12-01 LAB — LIPID PANEL
Cholesterol: 148 mg/dL (ref <200)
HDL: 51 mg/dL (ref >=50)
LDL Cholesterol (Calc): 78 mg/dL (calc)
Non-HDL Cholesterol (Calc): 97 mg/dL (calc) (ref <130)
Total CHOL/HDL Ratio: 2.9 (calc) (ref <5.0)
Triglycerides: 109 mg/dL (ref <150)

## 2019-12-01 LAB — URINE CULTURE
MICRO NUMBER:: 10000494
SPECIMEN QUALITY:: ADEQUATE

## 2019-12-01 LAB — HEMOGLOBIN A1C
Hgb A1c MFr Bld: 5.8 % of total Hgb — ABNORMAL HIGH (ref <5.7)
Mean Plasma Glucose: 120 (calc)
eAG (mmol/L): 6.6 (calc)

## 2019-12-01 LAB — MAGNESIUM: Magnesium: 2.3 mg/dL (ref 1.5–2.5)

## 2019-12-01 LAB — URIC ACID: Uric Acid, Serum: 4.7 mg/dL (ref 2.5–7.0)

## 2019-12-01 LAB — MICROALBUMIN / CREATININE URINE RATIO
Creatinine, Urine: 104 mg/dL (ref 20–275)
Microalb Creat Ratio: 4 mcg/mg creat (ref <30)
Microalb, Ur: 0.4 mg/dL

## 2019-12-01 LAB — VITAMIN D 25 HYDROXY (VIT D DEFICIENCY, FRACTURES): Vit D, 25-Hydroxy: 79 ng/mL (ref 30–100)

## 2019-12-01 LAB — TSH: TSH: 1.12 mIU/L (ref 0.40–4.50)

## 2019-12-01 LAB — INSULIN, RANDOM: Insulin: 11.2 u[IU]/mL

## 2019-12-02 ENCOUNTER — Other Ambulatory Visit (HOSPITAL_COMMUNITY): Payer: Medicare Other

## 2019-12-04 DIAGNOSIS — D0512 Intraductal carcinoma in situ of left breast: Secondary | ICD-10-CM | POA: Diagnosis not present

## 2019-12-04 LAB — HM MAMMOGRAPHY

## 2019-12-04 NOTE — Progress Notes (Signed)

## 2019-12-04 NOTE — H&P (Signed)
[name]  Location: [facility] Patient #: [id number] DOB: February 26, 1951 Undefined / Language: Cleophus Molt / Race: White Female  History of Present Illness   The patient is a 69 year old female who presents with a complaint of breast cancer.  The PCP is Dr. Leary Roca  The patient was referred by Dr. Loni Muse. Luan Pulling  The patient is at the Breast [facility] - Oncology is Drs. Philis Nettle. She is with her husband Bruce. [The Covid-19 virus has disrupted normal medical care in [place] and across the nation. We have sometimes had to alter normal surgical/medical care to limit this epidemic and we have explained these changes to the patient.]  She gets a mammogram every year. She is getting over Covid. She was in the bed for 4 days. She got antibiotics for "pneumonia". Her husband got sick first, then she got sick, but she got much sicker than he did. She had a prior right breast biopsy years ago which was benign  Mammograms: [facility] - 11/12/2019 - 0.5 cm oval mass in the 10 o'clock position of the left breast Biopsy: Her left breast biopsy - 11/13/2019 [id number]) - DCIS, intermediate grade Family history of breast or ovarian cancer: No On hormone therapy: No  I discussed the options for breast cancer treatment with the patient. The patient is at the [facility], which includes medical oncology and radiation oncology. I discussed the surgical options of lumpectomy vs. mastectomy. If mastectomy, there is the possibility of reconstruction. I discussed the options of lymph node biopsy. The treatment plan depends on the pathologic staging of the tumor and the patient's personal wishes. The risks of surgery include, but are not limited to, bleeding, infection, the need for further surgery, and nerve injury. The patient has been given literature on the treatment of breast cancer.  Plan: 1. Left breast lumpectomy  (seed localization), 2. Radiation tx (She lives in [place] and we talked about rad tx through Dr. Donella Stade), 3. Antihormone tx  Past Medical History: 1. Left breast cancer 2. HTN x 10 years 3. Pre diabetes 4. History of anxiety 5. Overweight 6. Had Covid-19 - 10/22/2019 7. she has had foot surgery before - but her feet are doing well right now. 8. Hysterectomy - 1990  Social History: Married. her husband Bruce. 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 Colon Polyp Removal - Colonoscopy  Foot Surgery  Bilateral. Hysterectomy (not due to cancer) - Partial  Tonsillectomy   Diagnostic Studies History Tawni Pummel, RN; 11/19/2019 7:27 AM) Colonoscopy  1-5 years ago Mammogram  within last year Pap Smear  >5 years ago  Medication History Tawni Pummel, RN; 11/19/2019 7:27 AM) Medications Reconciled  Social History Tawni Pummel, RN; 11/19/2019 7:27 AM) Alcohol use  Occasional alcohol use. Caffeine use  Carbonated beverages, Tea. No drug use  Tobacco use  Never smoker.  Family History Tawni Pummel, RN; 11/19/2019 7:27 AM) Arthritis  Mother. Cancer  Brother. Heart Disease  Mother. Heart disease in female family member before age 56  Respiratory Condition  Father.  Pregnancy / Birth History Tawni Pummel, RN; 11/19/2019 7:27 AM) Age at menarche  27 years. Age of menopause  51-55 Contraceptive History  Oral contraceptives. Gravida  1  Length (months) of breastfeeding  3-6 Maternal age  6-35 Para  1 Regular periods   Other Problems Tawni Pummel, RN; 11/19/2019 7:27 AM) Hemorrhoids  High blood pressure  Hypercholesterolemia     Review of Systems Sunday Spillers  Ledford RN; 11/19/2019 7:27 AM) General Present- Appetite Loss, Fatigue and Weight Loss. Not Present- Chills, Fever, Night Sweats and Weight Gain. Skin Not Present- Change in Wart/Mole, Dryness, Hives, Jaundice, New Lesions, Non-Healing Wounds, Rash and Ulcer. HEENT Present- Seasonal Allergies and Wears glasses/contact lenses. Not Present- Earache, Hearing Loss, Hoarseness, Nose Bleed, Oral Ulcers, Ringing in the Ears, Sinus Pain, Sore Throat, Visual Disturbances and Yellow Eyes. Respiratory Not Present- Bloody sputum, Chronic Cough, Difficulty Breathing, Snoring and Wheezing. Breast Present- Breast Mass. Not Present- Breast Pain, Nipple Discharge and Skin Changes. Cardiovascular Not Present- Chest Pain, Difficulty Breathing Lying Down, Leg Cramps, Palpitations, Rapid Heart Rate, Shortness of Breath and Swelling of Extremities. Gastrointestinal Not Present- Abdominal Pain, Bloating, Bloody Stool, Change in Bowel Habits, Chronic diarrhea, Constipation, Difficulty Swallowing, Excessive gas, Gets full quickly at meals, Hemorrhoids, Indigestion, Nausea, Rectal Pain and Vomiting. Female Genitourinary Not Present- Frequency, Nocturia, Painful Urination, Pelvic Pain and Urgency. Musculoskeletal Not Present- Back Pain, Joint Pain, Joint Stiffness, Muscle Pain, Muscle Weakness and Swelling of Extremities. Neurological Not Present- Decreased Memory, Fainting, Headaches, Numbness, Seizures, Tingling, Tremor, Trouble walking and Weakness. Psychiatric Present- Anxiety and Change in Sleep Pattern. Not Present- Bipolar, Depression, Fearful and Frequent crying. Endocrine Not Present- Cold Intolerance, Excessive Hunger, Hair Changes, Heat Intolerance, Hot flashes and New Diabetes. Hematology Present- Easy Bruising. Not Present- Blood Thinners, Excessive bleeding, Gland problems, HIV and Persistent Infections.   Physical Exam  General: WN WF who is alert and generally healthy appearing. She is wearing a mask. HEENT:  Normal. Pupils equal.  Neck: Supple. No mass. No thyroid mass. Lymph Nodes: No supraclavicular or cervical nodes.  Lungs: Clear to auscultation and symmetric breath sounds. Heart: RRR. No murmur or rub.  Breast: Right - No mass or nodule  Left - Bruise at 10 o'clock - there is a corresponding mass about 1.0 cm, but question whether this is hematoma  Abdomen: Soft. No mass. No tenderness. No hernia. Normal bowel sounds. Rectal: Not done.  Extremities: Good strength and ROM in upper and lower extremities.  Neurologic: Grossly intact to motor and sensory function. Psychiatric: Has normal mood and affect. Behavior is normal.  Assessment & Plan  1.  BREAST CANCER, STAGE 0, LEFT (D05.92)  Story: Left breast biopsy - 11/13/2019 [id number]) - DCIS, intermediate grade  Oncology - Drs. Philis Nettle.  Plan:  1. Left breast lumpectomy (seed localization),   2. Radiation tx (She lives in [place] and we talked about rad tx through Dr. Donella Stade),  3. Antihormone tx 2. HTN x 10 years 3. Pre diabetes 4. History of anxiety 5. Overweight 6. Had Covid-19 - 10/22/2019   Alphonsa Overall, MD, [facility] Surgery Office phone:  [phone]

## 2019-12-05 ENCOUNTER — Ambulatory Visit (HOSPITAL_BASED_OUTPATIENT_CLINIC_OR_DEPARTMENT_OTHER): Payer: Medicare Other | Admitting: Anesthesiology

## 2019-12-05 ENCOUNTER — Encounter (HOSPITAL_BASED_OUTPATIENT_CLINIC_OR_DEPARTMENT_OTHER)
Admission: RE | Disposition: A | Discharge status: Home / Self Care | Payer: Self-pay | Source: Home / Self Care | Attending: Surgery

## 2019-12-05 ENCOUNTER — Encounter (HOSPITAL_BASED_OUTPATIENT_CLINIC_OR_DEPARTMENT_OTHER): Payer: Self-pay | Admitting: Surgery

## 2019-12-05 ENCOUNTER — Ambulatory Visit (HOSPITAL_BASED_OUTPATIENT_CLINIC_OR_DEPARTMENT_OTHER)
Admission: RE | Admit: 2019-12-05 | Discharge: 2019-12-05 | Disposition: A | Discharge status: Home / Self Care | Payer: Medicare Other | Attending: Surgery | Admitting: Surgery

## 2019-12-05 ENCOUNTER — Other Ambulatory Visit: Payer: Self-pay

## 2019-12-05 DIAGNOSIS — C50212 Malignant neoplasm of upper-inner quadrant of left female breast: Secondary | ICD-10-CM | POA: Diagnosis not present

## 2019-12-05 DIAGNOSIS — E7800 Pure hypercholesterolemia, unspecified: Secondary | ICD-10-CM | POA: Diagnosis not present

## 2019-12-05 DIAGNOSIS — F419 Anxiety disorder, unspecified: Secondary | ICD-10-CM | POA: Insufficient documentation

## 2019-12-05 DIAGNOSIS — I10 Essential (primary) hypertension: Secondary | ICD-10-CM | POA: Insufficient documentation

## 2019-12-05 DIAGNOSIS — D0512 Intraductal carcinoma in situ of left breast: Secondary | ICD-10-CM | POA: Insufficient documentation

## 2019-12-05 DIAGNOSIS — Z8616 Personal history of COVID-19: Secondary | ICD-10-CM | POA: Diagnosis not present

## 2019-12-05 DIAGNOSIS — C50912 Malignant neoplasm of unspecified site of left female breast: Secondary | ICD-10-CM | POA: Diagnosis not present

## 2019-12-05 DIAGNOSIS — Z79899 Other long term (current) drug therapy: Secondary | ICD-10-CM | POA: Insufficient documentation

## 2019-12-05 DIAGNOSIS — R7303 Prediabetes: Secondary | ICD-10-CM | POA: Diagnosis not present

## 2019-12-05 DIAGNOSIS — E785 Hyperlipidemia, unspecified: Secondary | ICD-10-CM | POA: Diagnosis not present

## 2019-12-05 HISTORY — DX: Other specified postprocedural states: Z98.890

## 2019-12-05 HISTORY — DX: Other complications of anesthesia, initial encounter: T88.59XA

## 2019-12-05 HISTORY — DX: Nausea with vomiting, unspecified: R11.2

## 2019-12-05 HISTORY — PX: BREAST LUMPECTOMY WITH RADIOACTIVE SEED LOCALIZATION: SHX6424

## 2019-12-05 SURGERY — BREAST LUMPECTOMY WITH RADIOACTIVE SEED LOCALIZATION
Anesthesia: General | Site: Breast | Laterality: Left

## 2019-12-05 MED ORDER — CEFAZOLIN SODIUM-DEXTROSE 2-4 GM/100ML-% IV SOLN
2.0000 g | INTRAVENOUS | Status: AC
  Administered 2019-12-05: 2 g via INTRAVENOUS

## 2019-12-05 MED ORDER — FENTANYL CITRATE (PF) 100 MCG/2ML IJ SOLN
INTRAMUSCULAR | Status: AC
  Filled 2019-12-05: qty 2

## 2019-12-05 MED ORDER — FENTANYL CITRATE (PF) 100 MCG/2ML IJ SOLN
25.0000 ug | INTRAMUSCULAR | Status: DC | PRN
  Administered 2019-12-05 (×2): 50 ug via INTRAVENOUS

## 2019-12-05 MED ORDER — EPHEDRINE 5 MG/ML INJ
INTRAVENOUS | Status: AC
  Filled 2019-12-05: qty 10

## 2019-12-05 MED ORDER — BUPIVACAINE HCL (PF) 0.5 % IJ SOLN
INTRAMUSCULAR | Status: DC | PRN
  Administered 2019-12-05: 20 mL

## 2019-12-05 MED ORDER — 0.9 % SODIUM CHLORIDE (POUR BTL) OPTIME
TOPICAL | Status: DC | PRN
  Administered 2019-12-05: 14:00:00 1000 mL

## 2019-12-05 MED ORDER — PROMETHAZINE HCL 25 MG/ML IJ SOLN: 6.2500 mg | INTRAMUSCULAR | Status: DC | PRN

## 2019-12-05 MED ORDER — CEFAZOLIN SODIUM-DEXTROSE 2-4 GM/100ML-% IV SOLN
INTRAVENOUS | Status: AC
  Filled 2019-12-05: qty 100

## 2019-12-05 MED ORDER — CHLORHEXIDINE GLUCONATE CLOTH 2 % EX PADS: 6.0000 | MEDICATED_PAD | Freq: Once | CUTANEOUS | Status: DC

## 2019-12-05 MED ORDER — MIDAZOLAM HCL 2 MG/2ML IJ SOLN
INTRAMUSCULAR | Status: AC
  Filled 2019-12-05: qty 2

## 2019-12-05 MED ORDER — FENTANYL CITRATE (PF) 100 MCG/2ML IJ SOLN
INTRAMUSCULAR | Status: DC | PRN
  Administered 2019-12-05: 25 ug via INTRAVENOUS
  Administered 2019-12-05 (×2): 50 ug via INTRAVENOUS
  Administered 2019-12-05: 25 ug via INTRAVENOUS

## 2019-12-05 MED ORDER — ACETAMINOPHEN 500 MG PO TABS
ORAL_TABLET | ORAL | Status: AC
  Filled 2019-12-05: qty 2

## 2019-12-05 MED ORDER — ONDANSETRON HCL 4 MG/2ML IJ SOLN
INTRAMUSCULAR | Status: DC | PRN
  Administered 2019-12-05: 4 mg via INTRAVENOUS

## 2019-12-05 MED ORDER — LIDOCAINE HCL (CARDIAC) PF 100 MG/5ML IV SOSY
PREFILLED_SYRINGE | INTRAVENOUS | Status: DC | PRN
  Administered 2019-12-05: 50 mg via INTRAVENOUS

## 2019-12-05 MED ORDER — GLYCOPYRROLATE PF 0.2 MG/ML IJ SOSY
PREFILLED_SYRINGE | INTRAMUSCULAR | Status: AC
  Filled 2019-12-05: qty 1

## 2019-12-05 MED ORDER — EPHEDRINE SULFATE 50 MG/ML IJ SOLN
INTRAMUSCULAR | Status: DC | PRN
  Administered 2019-12-05: 10 mg via INTRAVENOUS

## 2019-12-05 MED ORDER — PROPOFOL 10 MG/ML IV BOLUS
INTRAVENOUS | Status: DC | PRN
  Administered 2019-12-05: 150 mg via INTRAVENOUS

## 2019-12-05 MED ORDER — OXYCODONE HCL 5 MG/5ML PO SOLN: 5.0000 mg | Freq: Once | ORAL | Status: AC | PRN

## 2019-12-05 MED ORDER — BUPIVACAINE HCL (PF) 0.5 % IJ SOLN
INTRAMUSCULAR | Status: AC
  Filled 2019-12-05: qty 30

## 2019-12-05 MED ORDER — GLYCOPYRROLATE 0.2 MG/ML IJ SOLN
INTRAMUSCULAR | Status: DC | PRN
  Administered 2019-12-05 (×2): .1 mg via INTRAVENOUS

## 2019-12-05 MED ORDER — TRAMADOL HCL 50 MG PO TABS: 50.0000 mg | ORAL_TABLET | Freq: Four times a day (QID) | ORAL | 1 refills | Status: DC | PRN

## 2019-12-05 MED ORDER — OXYCODONE HCL 5 MG PO TABS
5.0000 mg | ORAL_TABLET | Freq: Once | ORAL | Status: AC | PRN
  Administered 2019-12-05: 16:00:00 5 mg via ORAL

## 2019-12-05 MED ORDER — OXYCODONE HCL 5 MG PO TABS
ORAL_TABLET | ORAL | Status: AC
  Filled 2019-12-05: qty 1

## 2019-12-05 MED ORDER — ACETAMINOPHEN 500 MG PO TABS
1000.0000 mg | ORAL_TABLET | ORAL | Status: AC
  Administered 2019-12-05: 13:00:00 1000 mg via ORAL

## 2019-12-05 MED ORDER — MIDAZOLAM HCL 5 MG/5ML IJ SOLN
INTRAMUSCULAR | Status: DC | PRN
  Administered 2019-12-05: 2 mg via INTRAVENOUS

## 2019-12-05 MED ORDER — DEXAMETHASONE SODIUM PHOSPHATE 4 MG/ML IJ SOLN
INTRAMUSCULAR | Status: DC | PRN
  Administered 2019-12-05: 10 mg via INTRAVENOUS

## 2019-12-05 MED ORDER — LACTATED RINGERS IV SOLN: INTRAVENOUS | Status: DC

## 2019-12-05 MED ORDER — ACETAMINOPHEN 500 MG PO TABS: 1000.0000 mg | ORAL_TABLET | Freq: Once | ORAL | Status: DC

## 2019-12-05 MED ORDER — PROPOFOL 10 MG/ML IV BOLUS
INTRAVENOUS | Status: AC
  Filled 2019-12-05: qty 20

## 2019-12-05 SURGICAL SUPPLY — 57 items
ADH SKN CLS APL DERMABOND .7 (GAUZE/BANDAGES/DRESSINGS) ×1
APL PRP STRL LF DISP 70% ISPRP (MISCELLANEOUS) ×1
APL SKNCLS STERI-STRIP NONHPOA (GAUZE/BANDAGES/DRESSINGS)
BENZOIN TINCTURE PRP APPL 2/3 (GAUZE/BANDAGES/DRESSINGS) IMPLANT
BINDER BREAST LRG (GAUZE/BANDAGES/DRESSINGS) ×3 IMPLANT
BINDER BREAST MEDIUM (GAUZE/BANDAGES/DRESSINGS) IMPLANT
BINDER BREAST XLRG (GAUZE/BANDAGES/DRESSINGS) IMPLANT
BINDER BREAST XXLRG (GAUZE/BANDAGES/DRESSINGS) IMPLANT
BLADE HEX COATED 2.75 (ELECTRODE) IMPLANT
BLADE SURG 10 STRL SS (BLADE) IMPLANT
BLADE SURG 15 STRL LF DISP TIS (BLADE) ×1 IMPLANT
BLADE SURG 15 STRL SS (BLADE) ×3
CANISTER SUC SOCK COL 7IN (MISCELLANEOUS) IMPLANT
CANISTER SUCT 1200ML W/VALVE (MISCELLANEOUS) ×3 IMPLANT
CHLORAPREP W/TINT 26 (MISCELLANEOUS) ×3 IMPLANT
CLIP VESOCCLUDE SM WIDE 6/CT (CLIP) IMPLANT
CLOSURE WOUND 1/4X4 (GAUZE/BANDAGES/DRESSINGS)
COVER BACK TABLE 60X90IN (DRAPES) ×3 IMPLANT
COVER MAYO STAND STRL (DRAPES) ×3 IMPLANT
COVER PROBE W GEL 5X96 (DRAPES) ×3 IMPLANT
COVER WAND RF STERILE (DRAPES) IMPLANT
DECANTER SPIKE VIAL GLASS SM (MISCELLANEOUS) IMPLANT
DERMABOND ADVANCED (GAUZE/BANDAGES/DRESSINGS) ×2
DERMABOND ADVANCED .7 DNX12 (GAUZE/BANDAGES/DRESSINGS) ×1 IMPLANT
DRAPE HALF SHEET 70X43 (DRAPES) IMPLANT
DRAPE LAPAROSCOPIC ABDOMINAL (DRAPES) ×3 IMPLANT
DRAPE UTILITY XL STRL (DRAPES) ×3 IMPLANT
DRSG PAD ABDOMINAL 8X10 ST (GAUZE/BANDAGES/DRESSINGS) IMPLANT
ELECT COATED BLADE 2.86 ST (ELECTRODE) ×3 IMPLANT
ELECT REM PT RETURN 9FT ADLT (ELECTROSURGICAL) ×3
ELECTRODE REM PT RTRN 9FT ADLT (ELECTROSURGICAL) ×1 IMPLANT
GAUZE SPONGE 4X4 12PLY STRL (GAUZE/BANDAGES/DRESSINGS) ×3 IMPLANT
GAUZE SPONGE 4X4 12PLY STRL LF (GAUZE/BANDAGES/DRESSINGS) ×2 IMPLANT
GLOVE SURG SYN 7.5  E (GLOVE) ×2
GLOVE SURG SYN 7.5 E (GLOVE) ×1 IMPLANT
GLOVE SURG SYN 7.5 PF PI (GLOVE) ×1 IMPLANT
GOWN STRL REUS W/ TWL LRG LVL3 (GOWN DISPOSABLE) ×1 IMPLANT
GOWN STRL REUS W/ TWL XL LVL3 (GOWN DISPOSABLE) ×1 IMPLANT
GOWN STRL REUS W/TWL LRG LVL3 (GOWN DISPOSABLE) ×3
GOWN STRL REUS W/TWL XL LVL3 (GOWN DISPOSABLE) ×3
KIT MARKER MARGIN INK (KITS) ×3 IMPLANT
NDL HYPO 25X1 1.5 SAFETY (NEEDLE) ×1 IMPLANT
NEEDLE HYPO 25X1 1.5 SAFETY (NEEDLE) ×3 IMPLANT
NS IRRIG 1000ML POUR BTL (IV SOLUTION) IMPLANT
PACK BASIN DAY SURGERY FS (CUSTOM PROCEDURE TRAY) ×3 IMPLANT
PENCIL SMOKE EVACUATOR (MISCELLANEOUS) ×3 IMPLANT
SLEEVE SCD COMPRESS KNEE MED (MISCELLANEOUS) ×3 IMPLANT
SPONGE LAP 18X18 RF (DISPOSABLE) ×3 IMPLANT
STRIP CLOSURE SKIN 1/4X4 (GAUZE/BANDAGES/DRESSINGS) IMPLANT
SUT MNCRL AB 4-0 PS2 18 (SUTURE) ×3 IMPLANT
SUT VICRYL 3-0 CR8 SH (SUTURE) ×3 IMPLANT
SYR CONTROL 10ML LL (SYRINGE) ×3 IMPLANT
TOWEL GREEN STERILE FF (TOWEL DISPOSABLE) ×3 IMPLANT
TRAY FAXITRON CT DISP (TRAY / TRAY PROCEDURE) ×3 IMPLANT
TUBE CONNECTING 20'X1/4 (TUBING) ×1
TUBE CONNECTING 20X1/4 (TUBING) ×2 IMPLANT
YANKAUER SUCT BULB TIP NO VENT (SUCTIONS) ×3 IMPLANT

## 2019-12-05 NOTE — Transfer of Care (Signed)
Immediate Anesthesia Transfer of Care Note  Patient: [name]  Procedure(s) Performed: LEFT BREAST LUMPECTOMY WITH RADIOACTIVE SEED LOCALIZATION (Left Breast)  Patient Location: PACU  Anesthesia Type:General  Level of Consciousness: awake and patient cooperative  Airway & Oxygen Therapy: Patient Spontanous Breathing and Patient connected to face mask oxygen  Post-op Assessment: Report given to RN and Post -op Vital signs reviewed and stable  Post vital signs: Reviewed and stable  Last Vitals:  Vitals Value Taken Time  BP    Temp    Pulse    Resp    SpO2      Last Pain:  Vitals:   12/05/19 1233  TempSrc: Oral  PainSc: 0-No pain      Patients Stated Pain Goal: 3 ([id number])  Complications: No apparent anesthesia complications

## 2019-12-05 NOTE — Anesthesia Procedure Notes (Signed)
Procedure Name: LMA Insertion Date/Time: 12/05/2019 1:41 PM Performed by: Marrianne Mood, CRNA Pre-anesthesia Checklist: Patient identified, Emergency Drugs available, Suction available, Patient being monitored and Timeout performed Patient Re-evaluated:Patient Re-evaluated prior to induction Oxygen Delivery Method: Circle system utilized Preoxygenation: Pre-oxygenation with 100% oxygen Induction Type: IV induction Ventilation: Mask ventilation without difficulty LMA: LMA inserted LMA Size: 4.0 Number of attempts: 1 Airway Equipment and Method: Bite block Placement Confirmation: positive ETCO2 Tube secured with: Tape Dental Injury: Teeth and Oropharynx as per pre-operative assessment

## 2019-12-05 NOTE — Anesthesia Preprocedure Evaluation (Addendum)
Anesthesia Evaluation  Patient identified by MRN, date of birth, ID band Patient awake    Reviewed: Allergy & Precautions, NPO status , Patient's Chart, lab work & pertinent test results  History of Anesthesia Complications (+) PONV and history of anesthetic complications  Airway Mallampati: I  TM Distance: >3 FB Neck ROM: Full    Dental no notable dental hx.    Pulmonary neg pulmonary ROS,    Pulmonary exam normal        Cardiovascular hypertension, Pt. on medications Normal cardiovascular exam     Neuro/Psych Anxiety negative neurological ROS     GI/Hepatic negative GI ROS, Neg liver ROS,   Endo/Other  negative endocrine ROS  Renal/GU negative Renal ROS  negative genitourinary   Musculoskeletal  (+) Arthritis ,   Abdominal   Peds  Hematology negative hematology ROS (+)   Anesthesia Other Findings Day of surgery medications reviewed with patient.  Reproductive/Obstetrics negative OB ROS                            Anesthesia Physical Anesthesia Plan  ASA: II  Anesthesia Plan: General   Post-op Pain Management:    Induction: Intravenous  PONV Risk Score and Plan: 4 or greater and Treatment may vary due to age or medical condition, Ondansetron, Dexamethasone and Midazolam  Airway Management Planned: LMA  Additional Equipment: None  Intra-op Plan:   Post-operative Plan: Extubation in OR  Informed Consent: I have reviewed the patients History and Physical, chart, labs and discussed the procedure including the risks, benefits and alternatives for the proposed anesthesia with the patient or authorized representative who has indicated his/her understanding and acceptance.     Dental advisory given  Plan Discussed with: CRNA  Anesthesia Plan Comments:        Anesthesia Quick Evaluation

## 2019-12-05 NOTE — Op Note (Addendum)
12/05/2019  2:45 PM  PATIENT:  [name] DOB: [birth year] MRN: [id number]  PREOP DIAGNOSIS:   left breast cancer  POSTOP DIAGNOSIS:    Left breast cancer, 10 o'clock position (Tis, N0)  PROCEDURE:   Procedure(s):  LEFT BREAST LUMPECTOMY WITH RADIOACTIVE SEED LOCALIZATION  SURGEON:   Alphonsa Overall, M.D.  ANESTHESIA:   General  Anesthesiologist: Brennan Bailey, MD CRNA: Marrianne Mood, CRNA  General  EBL:  minimal  ml  DRAINS:  none   LOCAL MEDICATIONS USED:   20 cc 1/4% marcaine  SPECIMEN:   Left breast lumpectomy (6 color paint)  COUNTS CORRECT:  YES  INDICATIONS FOR PROCEDURE:  [name] is a 69 y.o. (DOB: 1951/01/06) white female whose primary care physician is Unk Pinto, MD and comes for left breast lumpectomy.   She was seen at the [facility] with Drs. Philis Nettle.  The options for breast cancer treatment have been discussed with the patient. She elected to proceed with lumpectomy.     The indications and potential complications of surgery were explained to the patient. Potential complications include, but are not limited to, bleeding, infection, the need for further surgery, and nerve injury.     She had a I131 seed placed on 12/04/2019 in her left breast at [facility].  The seed is in the 10 o'clock position of the left breast.  OPERATIVE NOTE:   The patient was taken to operating room # 8 at [facility] where she underwent a general anesthesia  supervised by Anesthesiologist: Brennan Bailey, MD CRNA: Marrianne Mood, CRNA. Her left breast and axilla were prepped with  ChloraPrep and sterilely draped.    A time-out was held and the surgical check list was reviewed.    The cancer was about at the 10 o'clock position of the left breast.   It was 5 cm from the areola, so I made an incision directly over the cancer.   I used the Neoprobe to identify the I131 seed.  I tried to excise an area around the tumor of at least 1 cm.     I excised this block of breast tissue approximately 3 cm by 4 cm  in diameter.   I painted the lumpectomy specimen with the 6 color paint kit and did a specimen mammogram which confirmed the mass, clip, and the seed were all in the right position in the specimen.  The specimen was sent to pathology who called back to confirm that they have the seed and the specimen.   I then irrigated the wound with saline. I infiltrated approximately 20 mL of 1/4% Marcaine between the incisions. I placed 4 clips to mark biopsy cavity, at 12, 3, 6, and 9 o'clock.  I then closed the wound in layers using 3-0 Vicryl sutures for the deep layer. At the skin, I closed the incision with a 4-0 Monocryl suture. The incision was then painted with Dermabond.  She had gauze place over the wounds and placed in a breast binder.   The patient tolerated the procedure well, was transported to the recovery room in good condition. Sponge and needle count were correct at the end of the case.   Final pathology is pending.    Left breast lumpectomy     Alphonsa Overall, MD, [facility] Pager: [phone] Office phone:  [phone]

## 2019-12-05 NOTE — Anesthesia Postprocedure Evaluation (Signed)
Anesthesia Post Note  Patient: [name]  Procedure(s) Performed: LEFT BREAST LUMPECTOMY WITH RADIOACTIVE SEED LOCALIZATION (Left Breast)     Patient location during evaluation: PACU Anesthesia Type: General Level of consciousness: awake and alert and oriented Pain management: pain level controlled Vital Signs Assessment: post-procedure vital signs reviewed and stable Respiratory status: spontaneous breathing, nonlabored ventilation and respiratory function stable Cardiovascular status: blood pressure returned to baseline Postop Assessment: no apparent nausea or vomiting Anesthetic complications: no    Last Vitals:  Vitals:   12/05/19 1510 12/05/19 1515  BP:  (!) 153/73  Pulse:  65  Resp:  17  Temp:    SpO2: 100% 99%    Last Pain:  Vitals:   12/05/19 1510  TempSrc:   PainSc: [place]

## 2019-12-05 NOTE — Discharge Instructions (Signed)
[facility] [place] [facility] - DISCHARGE INSTRUCTIONS TO PATIENT  Activity:  Driving - May drive tomorrow, if doing well.   Lifting - No lifting more than 15 pounds for 5 days, then no limit.                         Practice your Covid-19 protection:  Wear a mask, social distance, and wash your hands frequently  Wound Care:   Leave the incision dry for 2 days, then you may shower  Diet:  As tolerated  Follow up appointment:  Call Dr. Pollie Friar office [facility]) at [phone] for an appointment in 2 to 4 weeks..  Medications and dosages:  Resume your home medications.  You have a prescription for:  Ultram *You had 1000 mg of Tylenol at 12:40pm *You had 5mg  of oxycodone at 3:55pm Call Dr. Lucia Gaskins or his office  [phone]) if you have:  Temperature greater than 100.4,  Persistent nausea and vomiting,  Severe uncontrolled pain,  Redness, tenderness, or signs of infection (pain, swelling, redness, odor or green/yellow discharge around the site),  Any other questions or concerns you may have after discharge.  In an emergency, call 911 or go to an Emergency Department at a nearby hospital.   Post Anesthesia Home Care Instructions  Activity: Get plenty of rest for the remainder of the day. A responsible individual must stay with you for 24 hours following the procedure.  For the next 24 hours, DO NOT: -Drive a car -Paediatric nurse -Drink alcoholic beverages -Take any medication unless instructed by your physician -Make any legal decisions or sign important papers.  Meals: Start with liquid foods such as gelatin or soup. Progress to regular foods as tolerated. Avoid greasy, spicy, heavy foods. If nausea and/or vomiting occur, drink only clear liquids until the nausea and/or vomiting subsides. Call your physician if vomiting continues.  Special Instructions/Symptoms: Your throat may feel dry or sore from the anesthesia or the breathing tube placed in your throat  during surgery. If this causes discomfort, gargle with warm salt water. The discomfort should disappear within 24 hours.  If you had a scopolamine patch placed behind your ear for the management of post- operative nausea and/or vomiting:  1. The medication in the patch is effective for 72 hours, after which it should be removed.  Wrap patch in a tissue and discard in the trash. Wash hands thoroughly with soap and water. 2. You may remove the patch earlier than 72 hours if you experience unpleasant side effects which may include dry mouth, dizziness or visual disturbances. 3. Avoid touching the patch. Wash your hands with soap and water after contact with the patch.

## 2019-12-05 NOTE — Interval H&P Note (Signed)
History and Physical Interval Note:  12/05/2019 1:28 PM  [name]  has presented today for surgery, with the diagnosis of left breast cancer.  The various methods of treatment have been discussed with the patient and family.   Seed in position.  Her husband is here with her.  After consideration of risks, benefits and other options for treatment, the patient has consented to  Procedure(s): LEFT BREAST LUMPECTOMY WITH RADIOACTIVE SEED LOCALIZATION (Left) as a surgical intervention.  The patient's history has been reviewed, patient examined, no change in status, stable for surgery.  I have reviewed the patient's chart and labs.  Questions were answered to the patient's satisfaction.     Shann Medal

## 2019-12-08 ENCOUNTER — Encounter: Payer: Self-pay | Admitting: *Deleted

## 2019-12-08 DIAGNOSIS — C50212 Malignant neoplasm of upper-inner quadrant of left female breast: Secondary | ICD-10-CM | POA: Diagnosis not present

## 2019-12-08 LAB — SURGICAL PATHOLOGY

## 2019-12-11 ENCOUNTER — Encounter: Payer: Self-pay | Admitting: *Deleted

## 2019-12-15 DIAGNOSIS — L578 Other skin changes due to chronic exposure to nonionizing radiation: Secondary | ICD-10-CM | POA: Diagnosis not present

## 2019-12-15 DIAGNOSIS — L820 Inflamed seborrheic keratosis: Secondary | ICD-10-CM | POA: Diagnosis not present

## 2019-12-15 DIAGNOSIS — L821 Other seborrheic keratosis: Secondary | ICD-10-CM | POA: Diagnosis not present

## 2019-12-16 ENCOUNTER — Ambulatory Visit: Payer: Medicare Other | Admitting: Hematology and Oncology

## 2019-12-16 NOTE — Progress Notes (Signed)
Patient Care Team: Unk Pinto, MD as PCP - General (Internal Medicine) Avon Gully, NP as Nurse Practitioner (Obstetrics and Gynecology) Clarene Essex, MD as Consulting Physician (Gastroenterology) Aplington, Laurice Record, MD (Inactive) as Consulting Physician (Orthopedic Surgery) Suella Broad, MD as Consulting Physician (Physical Medicine and Rehabilitation) Jari Pigg, MD as Consulting Physician (Dermatology) Nicholas Lose, MD as Consulting Physician (Hematology and Oncology) Alphonsa Overall, MD as Consulting Physician (General Surgery) Eppie Gibson, MD as Attending Physician (Radiation Oncology) Mauro Kaufmann, RN as Oncology Nurse Navigator Rockwell Germany, RN as Oncology Nurse Navigator  DIAGNOSIS:    ICD-10-CM   1. Ductal carcinoma in situ (DCIS) of left breast  D05.12     SUMMARY OF ONCOLOGIC HISTORY: Oncology History  Ductal carcinoma in situ (DCIS) of left breast  11/17/2019 Initial Diagnosis   Screening mammogram detected left breast density UOQ 5 mm at 10 o'clock position.  Stereotactic biopsy revealed intermediate grade DCIS ER/PR positive   11/19/2019 Cancer Staging   Staging form: Breast, AJCC 8th Edition - Clinical stage from 11/19/2019: Stage 0 (cTis (DCIS), cN0, cM0, G2, ER+, PR+, HER2: Not Assessed) - Signed by Nicholas Lose, MD on 11/19/2019   12/05/2019 Surgery   Left lumpectomy Lucia Gaskins): DCIS, intermediate grade, 0.3cm, clear margins.   12/05/2019 Cancer Staging   Staging form: Breast, AJCC 8th Edition - Pathologic stage from 12/05/2019: Stage 0 (pTis (DCIS), pN0, cM0, ER+, PR+) - Signed by Gardenia Phlegm, NP on 12/17/2019     CHIEF COMPLIANT: Follow-up s/p lumpectomy to review pathology   INTERVAL HISTORY: [name] is a 69 y.o. with above-mentioned history of left breast DCIS. She underwent a left lumpectomy on 12/05/19 with Dr. Lucia Gaskins for which pathology showed DCIS, intermediate grade, 0.3cm, clear margins. She presents to the clinic  today to review the pathology report and discuss further treatment.   ALLERGIES:  has No Known Allergies.  MEDICATIONS:  Current Outpatient Medications  Medication Sig Dispense Refill  . allopurinol (ZYLOPRIM) 300 MG tablet Take 1 tablet Daily to Prevent Gout (Patient taking differently: Take 150 mg by mouth daily. Take 1 tablet Daily to Prevent Gout) 90 tablet 1  . ALPRAZolam (XANAX) 0.5 MG tablet Take 1/2 to 1 tablet 2 to 3 x /day as needed for Anxiety 90 tablet 0  . Ascorbic Acid (VITAMIN C) 1000 MG tablet Take 1,000 mg by mouth daily.    . calcium carbonate (OSCAL) 1500 (600 Ca) MG TABS tablet Take by mouth daily with breakfast.    . Cholecalciferol (VITAMIN D PO) Take 10,000 Units by mouth daily.    . Cyanocobalamin (VITAMIN B-12 PO) Take 1 tablet by mouth daily.    . fenofibrate (TRICOR) 145 MG tablet TAKE 1 TABLET BY MOUTH ONCE DAILY FOR CHOLESTEROL AND BLOOD FATA 90 tablet 0  . fluticasone (FLONASE) 50 MCG/ACT nasal spray Use 1 to 2 sprays each Nares 1 to 2 x /day 48 g 3  . loratadine (CLARITIN) 10 MG tablet Take 1 tablet (10 mg total) by mouth daily. 90 tablet 1  . nitrofurantoin, macrocrystal-monohydrate, (MACROBID) 100 MG capsule Take 1 capsule 2 x /day with food for UTI 20 capsule 0  . phentermine (ADIPEX-P) 37.5 MG tablet Take 1 tablet every morning for Dieting & Weight Loss 90 tablet 1  . pravastatin (PRAVACHOL) 20 MG tablet Take  1 tablet at Bedtime for Cholesterol 90 tablet 2  . pyridOXINE (VITAMIN B-6) 100 MG tablet Take 100 mg by mouth daily.    . traMADol Veatrice Bourbon)  50 MG tablet Take 1 tablet (50 mg total) by mouth every 6 (six) hours as needed for moderate pain or severe pain. 12 tablet 1  . triamterene-hydrochlorothiazide (MAXZIDE-25) 37.5-25 MG tablet Take 1/2 to 1 tablet Daily for BP & Fluid Retention 90 tablet 3  . zinc gluconate 50 MG tablet Take 50 mg by mouth daily.     No current facility-administered medications for this visit.    PHYSICAL EXAMINATION: ECOG  PERFORMANCE STATUS: 1 - Symptomatic but completely ambulatory  Vitals:   12/17/19 1420  BP: (!) 145/91  Pulse: 93  Resp: 18  Temp: 97.6 F (36.4 C)  SpO2: 98%   Filed Weights   12/17/19 1420  Weight: 152 lb 11.2 oz (69.3 kg)    LABORATORY DATA:  I have reviewed the data as listed CMP Latest Ref Rng & Units 11/19/2019 10/27/2019 05/26/2019  Glucose 70 - 99 mg/dL 105(H) 117(H) 88  BUN 8 - 23 mg/dL _0 Creatinine 0.44 - 1.00 mg/dL 0.91 0.78 1.00(H)  Sodium 135 - 145 mmol/L 143 138 141  Potassium 3.5 - 5.1 mmol/L 3.8 3.1(L) 3.6  Chloride 98 - 111 mmol/L 104 100 102  CO2 22 - 32 mmol/L 31 25 32  Calcium 8.9 - 10.3 mg/dL 10.0 8.9 10.2  Total Protein 6.5 - 8.1 g/dL 7.0 - 6.6  Total Bilirubin 0.3 - 1.2 mg/dL 0.4 - 0.5  Alkaline Phos 38 - 126 U/L 76 - -  AST 15 - 41 U/L 19 - 21  ALT 0 - 44 U/L 14 - 19    Lab Results  Component Value Date   WBC 5.8 11/19/2019   HGB 13.0 11/19/2019   HCT 40.3 11/19/2019   MCV 87.6 11/19/2019   PLT 224 11/19/2019   NEUTROABS 2.6 11/19/2019    ASSESSMENT & PLAN:  Ductal carcinoma in situ (DCIS) of left breast 12/05/2019: Left lumpectomy by Dr. Lucia Gaskins: Intermediate grade DCIS, 0.3 cm, margins clear, ER 100%, PR 90% Staging: Tis NX stage 0  Pathology counseling: I discussed the final pathology report of the patient provided  a copy of this report. I discussed the margins. We also discussed the final staging along with previously performed ER/PR testing.  Treatment plan: 1.  Adjuvant radiation therapy at [facility] 2.  Adjuvant antiestrogen therapy with tamoxifen 20 mg daily x5 years  Return to clinic at the end of radiation therapy to start antiestrogen therapy.  Patient is concerned about her family history of blood clots and heart attacks.  We may consider giving her anastrozole instead.  I will discuss this with her when she is ready to begin antiestrogen therapy.   No orders of the defined types were placed in this encounter.  The  patient has a good understanding of the overall plan. she agrees with it. she will call with any problems that may develop before the next visit here.  Total time spent: 15 mins including face to face time and time spent for planning, charting and coordination of care  Nicholas Lose, MD 12/17/2019  I, Cloyde Reams Dorshimer, am acting as scribe for Dr. Nicholas Lose.  I have reviewed the above documentation for accuracy and completeness, and I agree with the above.

## 2019-12-17 ENCOUNTER — Inpatient Hospital Stay: Payer: Medicare Other | Attending: Hematology and Oncology | Admitting: Hematology and Oncology

## 2019-12-17 ENCOUNTER — Other Ambulatory Visit: Payer: Self-pay

## 2019-12-17 DIAGNOSIS — Z170 Estrogen receptor positive status [ER+]: Secondary | ICD-10-CM | POA: Diagnosis not present

## 2019-12-17 DIAGNOSIS — D0512 Intraductal carcinoma in situ of left breast: Secondary | ICD-10-CM

## 2019-12-17 NOTE — Assessment & Plan Note (Signed)
12/05/2019: Left lumpectomy by Dr. Lucia Gaskins: Intermediate grade DCIS, 0.3 cm, margins clear, ER 100%, PR 90% Staging: Tis NX stage 0  Pathology counseling: I discussed the final pathology report of the patient provided  a copy of this report. I discussed the margins. We also discussed the final staging along with previously performed ER/PR testing.  Treatment plan: 1.  Adjuvant radiation therapy followed by 2.  Adjuvant antiestrogen therapy with tamoxifen 20 mg daily x5 years  Return to clinic at the end of radiation therapy to start antiestrogen therapy

## 2019-12-22 ENCOUNTER — Encounter: Payer: Self-pay | Admitting: Radiation Oncology

## 2019-12-22 ENCOUNTER — Ambulatory Visit
Admission: RE | Admit: 2019-12-22 | Discharge: 2019-12-22 | Disposition: A | Discharge status: Home / Self Care | Payer: Medicare Other | Source: Ambulatory Visit | Attending: Radiation Oncology | Admitting: Radiation Oncology

## 2019-12-22 ENCOUNTER — Other Ambulatory Visit: Payer: Self-pay

## 2019-12-22 VITALS — BP 131/78 | HR 87 | Temp 98.0°F | Resp 16 | Wt 154.4 lb

## 2019-12-22 DIAGNOSIS — E785 Hyperlipidemia, unspecified: Secondary | ICD-10-CM | POA: Diagnosis not present

## 2019-12-22 DIAGNOSIS — Z809 Family history of malignant neoplasm, unspecified: Secondary | ICD-10-CM | POA: Diagnosis not present

## 2019-12-22 DIAGNOSIS — D0512 Intraductal carcinoma in situ of left breast: Secondary | ICD-10-CM | POA: Diagnosis not present

## 2019-12-22 DIAGNOSIS — Z170 Estrogen receptor positive status [ER+]: Secondary | ICD-10-CM | POA: Diagnosis not present

## 2019-12-22 DIAGNOSIS — Z85828 Personal history of other malignant neoplasm of skin: Secondary | ICD-10-CM | POA: Insufficient documentation

## 2019-12-22 DIAGNOSIS — I10 Essential (primary) hypertension: Secondary | ICD-10-CM | POA: Insufficient documentation

## 2019-12-22 DIAGNOSIS — Z79899 Other long term (current) drug therapy: Secondary | ICD-10-CM | POA: Insufficient documentation

## 2019-12-22 DIAGNOSIS — F419 Anxiety disorder, unspecified: Secondary | ICD-10-CM | POA: Diagnosis not present

## 2019-12-22 DIAGNOSIS — Z90710 Acquired absence of both cervix and uterus: Secondary | ICD-10-CM | POA: Insufficient documentation

## 2019-12-22 NOTE — Consult Note (Signed)
NEW PATIENT EVALUATION  Name: [name]  MRN: [id number]  Date:   12/22/2019     DOB: Oct 08, 1951   This 69 y.o. female patient presents to the clinic for initial evaluation of stage 0 (Tis N0 M0) ER/PR positive ductal carcinoma in situ status post wide local excision with close margin.  REFERRING PHYSICIAN: Alphonsa Overall, MD  CHIEF COMPLAINT:  Chief Complaint  Patient presents with  . Breast Cancer    DIAGNOSIS: The encounter diagnosis was Ductal carcinoma in situ (DCIS) of left breast.   PREVIOUS INVESTIGATIONS:  Mammogram and ultrasound reviewed Clinical notes reviewed Pathology report reviewed  HPI: Patient is a 69 year old female who presented with an abnormal mammogram Of her left breast.  This was confirmed by ultrasound to be a 4 to 5 mm oval mass with indistinct margins at the 10 o'clock position of the left breast mid depth level.  Ultrasound-guided biopsy was performed showing ductal carcinoma in situ ER/PR positive.  She went on to have a wide local excision showing a 0.3 cm nuclear grade 2 ductal carcinoma in situ with margins clear but close at one  millimeter medially.  Again tumor was ER/PR positive.  She tolerated her surgery well.  She has been seen by medical oncology with recommendation for antiestrogen therapy after completion of radiation.  She is seen today for radiation oncology opinion.  She is somewhat scared although she is healing up well has very little post excision tenderness.  PLANNED TREATMENT REGIMEN: Left breast  hypofractionated radiation therapy with 1600 centigrade scar boost by electrons  PAST MEDICAL HISTORY:  has a past medical history of Allergy, Anxiety, Bell palsy (02/04/2016), Cancer ([place]) ([id number]), Complication of anesthesia, Hyperlipidemia, Hypertension (2005), Plantar fasciitis, and PONV (postoperative nausea and vomiting).    PAST SURGICAL HISTORY:  Past Surgical History:  Procedure Laterality Date  . ABDOMINAL HYSTERECTOMY  1990  .  basal cell carcinoma  2011   nose  . BREAST LUMPECTOMY WITH RADIOACTIVE SEED LOCALIZATION Left 12/05/2019   Procedure: LEFT BREAST LUMPECTOMY WITH RADIOACTIVE SEED LOCALIZATION;  Surgeon: Alphonsa Overall, MD;  Location: [place];  Service: General;  Laterality: Left;  . TENDON REPAIR     plantar fasciitis  . TONSILLECTOMY      FAMILY HISTORY: family history includes Arthritis in her mother; Cancer in her brother and father; Heart attack in her mother; Heart disease (age of onset: 87) in her mother; Hyperlipidemia in her mother; Hypertension in her brother and mother; Parkinson's disease in her brother; Stroke in her mother.  SOCIAL HISTORY:  reports that she has never smoked. She has never used smokeless tobacco. She reports current alcohol use. She reports that she does not use drugs.  ALLERGIES: Patient has no known allergies.  MEDICATIONS:  Current Outpatient Medications  Medication Sig Dispense Refill  . allopurinol (ZYLOPRIM) 300 MG tablet Take 1 tablet Daily to Prevent Gout (Patient taking differently: Take 150 mg by mouth daily. Take 1 tablet Daily to Prevent Gout) 90 tablet 1  . ALPRAZolam (XANAX) 0.5 MG tablet Take 1/2 to 1 tablet 2 to 3 x /day as needed for Anxiety 90 tablet 0  . Ascorbic Acid (VITAMIN C) 1000 MG tablet Take 1,000 mg by mouth daily.    . calcium carbonate (OSCAL) 1500 (600 Ca) MG TABS tablet Take by mouth daily with breakfast.    . Cholecalciferol (VITAMIN D PO) Take 10,000 Units by mouth daily.    . Cyanocobalamin (VITAMIN B-12 PO) Take 1 tablet by  mouth daily.    . fenofibrate (TRICOR) 145 MG tablet TAKE 1 TABLET BY MOUTH ONCE DAILY FOR CHOLESTEROL AND BLOOD FATA 90 tablet 0  . fluticasone (FLONASE) 50 MCG/ACT nasal spray Use 1 to 2 sprays each Nares 1 to 2 x /day 48 g 3  . loratadine (CLARITIN) 10 MG tablet Take 1 tablet (10 mg total) by mouth daily. 90 tablet 1  . nitrofurantoin, macrocrystal-monohydrate, (MACROBID) 100 MG capsule Take 1 capsule  2 x /day with food for UTI 20 capsule 0  . phentermine (ADIPEX-P) 37.5 MG tablet Take 1 tablet every morning for Dieting & Weight Loss 90 tablet 1  . pravastatin (PRAVACHOL) 20 MG tablet Take  1 tablet at Bedtime for Cholesterol 90 tablet 2  . pyridOXINE (VITAMIN B-6) 100 MG tablet Take 100 mg by mouth daily.    . traMADol (ULTRAM) 50 MG tablet Take 1 tablet (50 mg total) by mouth every 6 (six) hours as needed for moderate pain or severe pain. 12 tablet 1  . triamterene-hydrochlorothiazide (MAXZIDE-25) 37.5-25 MG tablet Take 1/2 to 1 tablet Daily for BP & Fluid Retention 90 tablet 3  . zinc gluconate 50 MG tablet Take 50 mg by mouth daily.     No current facility-administered medications for this encounter.    ECOG PERFORMANCE STATUS:  0 - Asymptomatic  REVIEW OF SYSTEMS: Patient denies any weight loss, fatigue, weakness, fever, chills or night sweats. Patient denies any loss of vision, blurred vision. Patient denies any ringing  of the ears or hearing loss. No irregular heartbeat. Patient denies heart murmur or history of fainting. Patient denies any chest pain or pain radiating to her upper extremities. Patient denies any shortness of breath, difficulty breathing at night, cough or hemoptysis. Patient denies any swelling in the lower legs. Patient denies any nausea vomiting, vomiting of blood, or coffee ground material in the vomitus. Patient denies any stomach pain. Patient states has had normal bowel movements no significant constipation or diarrhea. Patient denies any dysuria, hematuria or significant nocturia. Patient denies any problems walking, swelling in the joints or loss of balance. Patient denies any skin changes, loss of hair or loss of weight. Patient denies any excessive worrying or anxiety or significant depression. Patient denies any problems with insomnia. Patient denies excessive thirst, polyuria, polydipsia. Patient denies any swollen glands, patient denies easy bruising or easy  bleeding. Patient denies any recent infections, allergies or URI. Patient "s visual fields have not changed significantly in recent time.   PHYSICAL EXAM: BP 131/78   Pulse 87   Temp 98 F (36.7 C)   Resp 16   Wt 154 lb 6.4 oz (70 kg)   SpO2 99%   BMI 24.18 kg/m  She has a wide local excision present in the superior portion of the left breast which is healing well.  No dominant mass or nodularity is noted in either breast in 2 positions examined.  There is significant volume loss of the left breast.  No axillary or supraclavicular adenopathy is appreciated.  Well-developed well-nourished patient in NAD. HEENT reveals PERLA, EOMI, discs not visualized.  Oral cavity is clear. No oral mucosal lesions are identified. Neck is clear without evidence of cervical or supraclavicular adenopathy. Lungs are clear to A&P. Cardiac examination is essentially unremarkable with regular rate and rhythm without murmur rub or thrill. Abdomen is benign with no organomegaly or masses noted. Motor sensory and DTR levels are equal and symmetric in the upper and lower extremities. Cranial nerves II  through XII are grossly intact. Proprioception is intact. No peripheral adenopathy or edema is identified. No motor or sensory levels are noted. Crude visual fields are within normal range.  LABORATORY DATA: Pathology report reviewed    RADIOLOGY RESULTS: Mammogram and ultrasound reviewed compatible with above-stated findings   IMPRESSION: Stage 0 (Tis N0 M0) ER/PR positive ductal carcinoma in situ of the left breast status post wide local excision in 69 year old female  PLAN: This time I believe the patient will be a candidate for hypofractionated course of treatment.  I will plan on treating her whole breast over 3 weeks.  I also boost her scar another 1600 cGy in 8 fractions based on the DCIS having a 1 mm margin.  Risks and benefits of treatment occluding skin reaction fatigue alteration of blood counts possible  inclusion of superficial lung or a were discussed in detail with the patient.  She seems to comprehend my treatment plan well.  I have personally set up and ordered CT simulation for later this week.  Patient also will benefit from antiestrogen therapy after completion of radiation.  Patient comprehends my recommendations well.  I would like to take this opportunity to thank you for allowing me to participate in the care of your patient.Noreene Filbert, MD

## 2019-12-24 ENCOUNTER — Other Ambulatory Visit: Payer: Self-pay

## 2019-12-25 ENCOUNTER — Other Ambulatory Visit: Payer: Self-pay

## 2019-12-25 ENCOUNTER — Ambulatory Visit: Payer: Medicare Other

## 2019-12-25 ENCOUNTER — Ambulatory Visit (INDEPENDENT_AMBULATORY_CARE_PROVIDER_SITE_OTHER): Payer: Medicare Other

## 2019-12-25 ENCOUNTER — Ambulatory Visit
Admission: RE | Admit: 2019-12-25 | Discharge: 2019-12-25 | Disposition: A | Discharge status: Home / Self Care | Payer: Medicare Other | Source: Ambulatory Visit | Attending: Radiation Oncology | Admitting: Radiation Oncology

## 2019-12-25 DIAGNOSIS — N3000 Acute cystitis without hematuria: Secondary | ICD-10-CM | POA: Diagnosis not present

## 2019-12-25 DIAGNOSIS — Z170 Estrogen receptor positive status [ER+]: Secondary | ICD-10-CM | POA: Diagnosis not present

## 2019-12-25 DIAGNOSIS — D0512 Intraductal carcinoma in situ of left breast: Secondary | ICD-10-CM | POA: Diagnosis not present

## 2019-12-26 ENCOUNTER — Encounter: Payer: Self-pay | Admitting: *Deleted

## 2019-12-26 ENCOUNTER — Other Ambulatory Visit: Payer: Self-pay | Admitting: *Deleted

## 2019-12-26 DIAGNOSIS — D0512 Intraductal carcinoma in situ of left breast: Secondary | ICD-10-CM

## 2019-12-26 LAB — URINE CULTURE
MICRO NUMBER:: 10093070
Result:: NO GROWTH
SPECIMEN QUALITY:: ADEQUATE

## 2019-12-26 LAB — URINALYSIS, ROUTINE W REFLEX MICROSCOPIC
Bilirubin Urine: NEGATIVE
Glucose, UA: NEGATIVE
Hgb urine dipstick: NEGATIVE
Ketones, ur: NEGATIVE
Leukocytes,Ua: NEGATIVE
Nitrite: NEGATIVE
Protein, ur: NEGATIVE
Specific Gravity, Urine: 1.015 (ref 1.001–1.03)
pH: 7 (ref 5.0–8.0)

## 2019-12-29 ENCOUNTER — Ambulatory Visit: Payer: Medicare Other

## 2019-12-29 DIAGNOSIS — D0512 Intraductal carcinoma in situ of left breast: Secondary | ICD-10-CM | POA: Diagnosis not present

## 2019-12-29 DIAGNOSIS — Z170 Estrogen receptor positive status [ER+]: Secondary | ICD-10-CM | POA: Diagnosis not present

## 2019-12-29 DIAGNOSIS — Z510 Encounter for antineoplastic radiation therapy: Secondary | ICD-10-CM | POA: Insufficient documentation

## 2019-12-31 ENCOUNTER — Other Ambulatory Visit: Payer: Self-pay

## 2019-12-31 ENCOUNTER — Ambulatory Visit
Admission: RE | Admit: 2019-12-31 | Discharge: 2019-12-31 | Disposition: A | Discharge status: Home / Self Care | Payer: Medicare Other | Source: Ambulatory Visit | Attending: Radiation Oncology | Admitting: Radiation Oncology

## 2019-12-31 DIAGNOSIS — Z510 Encounter for antineoplastic radiation therapy: Secondary | ICD-10-CM | POA: Diagnosis not present

## 2019-12-31 DIAGNOSIS — Z170 Estrogen receptor positive status [ER+]: Secondary | ICD-10-CM | POA: Diagnosis not present

## 2019-12-31 DIAGNOSIS — D0512 Intraductal carcinoma in situ of left breast: Secondary | ICD-10-CM | POA: Diagnosis not present

## 2020-01-01 ENCOUNTER — Ambulatory Visit
Admission: RE | Admit: 2020-01-01 | Discharge: 2020-01-01 | Disposition: A | Discharge status: Home / Self Care | Payer: Medicare Other | Source: Ambulatory Visit | Attending: Radiation Oncology | Admitting: Radiation Oncology

## 2020-01-01 ENCOUNTER — Other Ambulatory Visit: Payer: Self-pay

## 2020-01-01 DIAGNOSIS — D0512 Intraductal carcinoma in situ of left breast: Secondary | ICD-10-CM | POA: Diagnosis not present

## 2020-01-01 DIAGNOSIS — Z170 Estrogen receptor positive status [ER+]: Secondary | ICD-10-CM | POA: Diagnosis not present

## 2020-01-01 DIAGNOSIS — Z510 Encounter for antineoplastic radiation therapy: Secondary | ICD-10-CM | POA: Diagnosis not present

## 2020-01-02 ENCOUNTER — Other Ambulatory Visit: Payer: Self-pay

## 2020-01-02 ENCOUNTER — Ambulatory Visit
Admission: RE | Admit: 2020-01-02 | Discharge: 2020-01-02 | Disposition: A | Discharge status: Home / Self Care | Payer: Medicare Other | Source: Ambulatory Visit | Attending: Radiation Oncology | Admitting: Radiation Oncology

## 2020-01-02 DIAGNOSIS — Z170 Estrogen receptor positive status [ER+]: Secondary | ICD-10-CM | POA: Diagnosis not present

## 2020-01-02 DIAGNOSIS — Z510 Encounter for antineoplastic radiation therapy: Secondary | ICD-10-CM | POA: Diagnosis not present

## 2020-01-02 DIAGNOSIS — D0512 Intraductal carcinoma in situ of left breast: Secondary | ICD-10-CM | POA: Diagnosis not present

## 2020-01-05 ENCOUNTER — Telehealth: Payer: Self-pay | Admitting: *Deleted

## 2020-01-05 ENCOUNTER — Other Ambulatory Visit: Payer: Self-pay

## 2020-01-05 ENCOUNTER — Ambulatory Visit
Admission: RE | Admit: 2020-01-05 | Discharge: 2020-01-05 | Disposition: A | Discharge status: Home / Self Care | Payer: Medicare Other | Source: Ambulatory Visit | Attending: Radiation Oncology | Admitting: Radiation Oncology

## 2020-01-05 DIAGNOSIS — Z510 Encounter for antineoplastic radiation therapy: Secondary | ICD-10-CM | POA: Diagnosis not present

## 2020-01-05 DIAGNOSIS — Z853 Personal history of malignant neoplasm of breast: Secondary | ICD-10-CM | POA: Diagnosis not present

## 2020-01-05 DIAGNOSIS — D0512 Intraductal carcinoma in situ of left breast: Secondary | ICD-10-CM | POA: Diagnosis not present

## 2020-01-05 DIAGNOSIS — Z170 Estrogen receptor positive status [ER+]: Secondary | ICD-10-CM | POA: Diagnosis not present

## 2020-01-05 DIAGNOSIS — L650 Telogen effluvium: Secondary | ICD-10-CM | POA: Diagnosis not present

## 2020-01-05 NOTE — Telephone Encounter (Signed)
Connected with Skipper Cliche to complete [employer] Cancer Coverage Claim form.   "I retired in 2018.  Disregard that form.  It's been completed by Dr. Lucia Gaskins."

## 2020-01-06 ENCOUNTER — Other Ambulatory Visit: Payer: Self-pay

## 2020-01-06 ENCOUNTER — Ambulatory Visit
Admission: RE | Admit: 2020-01-06 | Discharge: 2020-01-06 | Disposition: A | Discharge status: Home / Self Care | Payer: Medicare Other | Source: Ambulatory Visit | Attending: Radiation Oncology | Admitting: Radiation Oncology

## 2020-01-06 DIAGNOSIS — D0512 Intraductal carcinoma in situ of left breast: Secondary | ICD-10-CM | POA: Diagnosis not present

## 2020-01-06 DIAGNOSIS — Z510 Encounter for antineoplastic radiation therapy: Secondary | ICD-10-CM | POA: Diagnosis not present

## 2020-01-06 DIAGNOSIS — Z170 Estrogen receptor positive status [ER+]: Secondary | ICD-10-CM | POA: Diagnosis not present

## 2020-01-07 ENCOUNTER — Ambulatory Visit
Admission: RE | Admit: 2020-01-07 | Discharge: 2020-01-07 | Disposition: A | Discharge status: Home / Self Care | Payer: Medicare Other | Source: Ambulatory Visit | Attending: Radiation Oncology | Admitting: Radiation Oncology

## 2020-01-07 ENCOUNTER — Other Ambulatory Visit: Payer: Self-pay

## 2020-01-07 DIAGNOSIS — Z170 Estrogen receptor positive status [ER+]: Secondary | ICD-10-CM | POA: Diagnosis not present

## 2020-01-07 DIAGNOSIS — D0512 Intraductal carcinoma in situ of left breast: Secondary | ICD-10-CM | POA: Diagnosis not present

## 2020-01-07 DIAGNOSIS — Z510 Encounter for antineoplastic radiation therapy: Secondary | ICD-10-CM | POA: Diagnosis not present

## 2020-01-07 NOTE — Telephone Encounter (Signed)
Successfully faxed to [facility] [phone] as additional information for [facility] if needed.  Patient stated with previous call  "not being paid anything since opened policy in [id number]."  Advised to send EOB forms and other examples listed on bottom of form.   Original mailed to patient address on file.

## 2020-01-08 ENCOUNTER — Ambulatory Visit
Admission: RE | Admit: 2020-01-08 | Discharge: 2020-01-08 | Disposition: A | Discharge status: Home / Self Care | Payer: Medicare Other | Source: Ambulatory Visit | Attending: Radiation Oncology | Admitting: Radiation Oncology

## 2020-01-08 ENCOUNTER — Other Ambulatory Visit: Payer: Self-pay

## 2020-01-08 DIAGNOSIS — Z510 Encounter for antineoplastic radiation therapy: Secondary | ICD-10-CM | POA: Diagnosis not present

## 2020-01-08 DIAGNOSIS — D0512 Intraductal carcinoma in situ of left breast: Secondary | ICD-10-CM | POA: Diagnosis not present

## 2020-01-09 ENCOUNTER — Other Ambulatory Visit: Payer: Self-pay

## 2020-01-09 ENCOUNTER — Ambulatory Visit
Admission: RE | Admit: 2020-01-09 | Discharge: 2020-01-09 | Disposition: A | Discharge status: Home / Self Care | Payer: Medicare Other | Source: Ambulatory Visit | Attending: Radiation Oncology | Admitting: Radiation Oncology

## 2020-01-09 DIAGNOSIS — Z510 Encounter for antineoplastic radiation therapy: Secondary | ICD-10-CM | POA: Diagnosis not present

## 2020-01-09 DIAGNOSIS — D0512 Intraductal carcinoma in situ of left breast: Secondary | ICD-10-CM | POA: Diagnosis not present

## 2020-01-10 ENCOUNTER — Other Ambulatory Visit: Payer: Self-pay | Admitting: Adult Health

## 2020-01-10 DIAGNOSIS — E782 Mixed hyperlipidemia: Secondary | ICD-10-CM

## 2020-01-12 ENCOUNTER — Inpatient Hospital Stay: Payer: Medicare Other | Attending: Radiation Oncology

## 2020-01-12 ENCOUNTER — Other Ambulatory Visit: Payer: Self-pay

## 2020-01-12 ENCOUNTER — Ambulatory Visit
Admission: RE | Admit: 2020-01-12 | Discharge: 2020-01-12 | Disposition: A | Discharge status: Home / Self Care | Payer: Medicare Other | Source: Ambulatory Visit | Attending: Radiation Oncology | Admitting: Radiation Oncology

## 2020-01-12 DIAGNOSIS — D0512 Intraductal carcinoma in situ of left breast: Secondary | ICD-10-CM | POA: Diagnosis not present

## 2020-01-12 DIAGNOSIS — Z510 Encounter for antineoplastic radiation therapy: Secondary | ICD-10-CM | POA: Diagnosis not present

## 2020-01-12 LAB — CBC
HCT: 44.4 % (ref 36.0–46.0)
Hemoglobin: 14.4 g/dL (ref 12.0–15.0)
MCH: 28.9 pg (ref 26.0–34.0)
MCHC: 32.4 g/dL (ref 30.0–36.0)
MCV: 89.2 fL (ref 80.0–100.0)
Platelets: 224 10*3/uL (ref 150–400)
RBC: 4.98 MIL/uL (ref 3.87–5.11)
RDW: 13.7 % (ref 11.5–15.5)
WBC: 5.6 10*3/uL (ref 4.0–10.5)
nRBC: 0 % (ref 0.0–0.2)

## 2020-01-13 ENCOUNTER — Ambulatory Visit
Admission: RE | Admit: 2020-01-13 | Discharge: 2020-01-13 | Disposition: A | Discharge status: Home / Self Care | Payer: Medicare Other | Source: Ambulatory Visit | Attending: Radiation Oncology | Admitting: Radiation Oncology

## 2020-01-13 ENCOUNTER — Other Ambulatory Visit: Payer: Self-pay

## 2020-01-13 DIAGNOSIS — D0512 Intraductal carcinoma in situ of left breast: Secondary | ICD-10-CM | POA: Diagnosis not present

## 2020-01-13 DIAGNOSIS — Z170 Estrogen receptor positive status [ER+]: Secondary | ICD-10-CM | POA: Diagnosis not present

## 2020-01-13 DIAGNOSIS — Z510 Encounter for antineoplastic radiation therapy: Secondary | ICD-10-CM | POA: Diagnosis not present

## 2020-01-14 ENCOUNTER — Ambulatory Visit
Admission: RE | Admit: 2020-01-14 | Discharge: 2020-01-14 | Disposition: A | Discharge status: Home / Self Care | Payer: Medicare Other | Source: Ambulatory Visit | Attending: Radiation Oncology | Admitting: Radiation Oncology

## 2020-01-14 ENCOUNTER — Other Ambulatory Visit: Payer: Self-pay

## 2020-01-14 DIAGNOSIS — Z170 Estrogen receptor positive status [ER+]: Secondary | ICD-10-CM | POA: Diagnosis not present

## 2020-01-14 DIAGNOSIS — D0512 Intraductal carcinoma in situ of left breast: Secondary | ICD-10-CM | POA: Diagnosis not present

## 2020-01-14 DIAGNOSIS — Z510 Encounter for antineoplastic radiation therapy: Secondary | ICD-10-CM | POA: Diagnosis not present

## 2020-01-15 ENCOUNTER — Ambulatory Visit
Admission: RE | Admit: 2020-01-15 | Discharge: 2020-01-15 | Disposition: A | Discharge status: Home / Self Care | Payer: Medicare Other | Source: Ambulatory Visit | Attending: Radiation Oncology | Admitting: Radiation Oncology

## 2020-01-15 ENCOUNTER — Other Ambulatory Visit: Payer: Self-pay

## 2020-01-15 DIAGNOSIS — Z510 Encounter for antineoplastic radiation therapy: Secondary | ICD-10-CM | POA: Diagnosis not present

## 2020-01-15 DIAGNOSIS — Z170 Estrogen receptor positive status [ER+]: Secondary | ICD-10-CM | POA: Diagnosis not present

## 2020-01-15 DIAGNOSIS — D0512 Intraductal carcinoma in situ of left breast: Secondary | ICD-10-CM | POA: Diagnosis not present

## 2020-01-16 ENCOUNTER — Ambulatory Visit
Admission: RE | Admit: 2020-01-16 | Discharge: 2020-01-16 | Disposition: A | Discharge status: Home / Self Care | Payer: Medicare Other | Source: Ambulatory Visit | Attending: Radiation Oncology | Admitting: Radiation Oncology

## 2020-01-16 ENCOUNTER — Other Ambulatory Visit: Payer: Self-pay

## 2020-01-16 DIAGNOSIS — D0512 Intraductal carcinoma in situ of left breast: Secondary | ICD-10-CM | POA: Diagnosis not present

## 2020-01-16 DIAGNOSIS — Z510 Encounter for antineoplastic radiation therapy: Secondary | ICD-10-CM | POA: Diagnosis not present

## 2020-01-19 ENCOUNTER — Other Ambulatory Visit: Payer: Self-pay

## 2020-01-19 ENCOUNTER — Ambulatory Visit
Admission: RE | Admit: 2020-01-19 | Discharge: 2020-01-19 | Disposition: A | Discharge status: Home / Self Care | Payer: Medicare Other | Source: Ambulatory Visit | Attending: Radiation Oncology | Admitting: Radiation Oncology

## 2020-01-19 DIAGNOSIS — Z170 Estrogen receptor positive status [ER+]: Secondary | ICD-10-CM | POA: Diagnosis not present

## 2020-01-19 DIAGNOSIS — Z510 Encounter for antineoplastic radiation therapy: Secondary | ICD-10-CM | POA: Diagnosis not present

## 2020-01-19 DIAGNOSIS — D0512 Intraductal carcinoma in situ of left breast: Secondary | ICD-10-CM | POA: Diagnosis not present

## 2020-01-20 ENCOUNTER — Ambulatory Visit
Admission: RE | Admit: 2020-01-20 | Discharge: 2020-01-20 | Disposition: A | Discharge status: Home / Self Care | Payer: Medicare Other | Source: Ambulatory Visit | Attending: Radiation Oncology | Admitting: Radiation Oncology

## 2020-01-20 ENCOUNTER — Other Ambulatory Visit: Payer: Self-pay

## 2020-01-20 DIAGNOSIS — Z510 Encounter for antineoplastic radiation therapy: Secondary | ICD-10-CM | POA: Diagnosis not present

## 2020-01-20 DIAGNOSIS — D0512 Intraductal carcinoma in situ of left breast: Secondary | ICD-10-CM | POA: Diagnosis not present

## 2020-01-20 DIAGNOSIS — Z170 Estrogen receptor positive status [ER+]: Secondary | ICD-10-CM | POA: Diagnosis not present

## 2020-01-21 ENCOUNTER — Ambulatory Visit
Admission: RE | Admit: 2020-01-21 | Discharge: 2020-01-21 | Disposition: A | Discharge status: Home / Self Care | Payer: Medicare Other | Source: Ambulatory Visit | Attending: Radiation Oncology | Admitting: Radiation Oncology

## 2020-01-21 ENCOUNTER — Other Ambulatory Visit: Payer: Self-pay

## 2020-01-21 DIAGNOSIS — Z510 Encounter for antineoplastic radiation therapy: Secondary | ICD-10-CM | POA: Diagnosis not present

## 2020-01-21 DIAGNOSIS — Z170 Estrogen receptor positive status [ER+]: Secondary | ICD-10-CM | POA: Diagnosis not present

## 2020-01-21 DIAGNOSIS — D0512 Intraductal carcinoma in situ of left breast: Secondary | ICD-10-CM | POA: Diagnosis not present

## 2020-01-22 ENCOUNTER — Other Ambulatory Visit: Payer: Self-pay

## 2020-01-22 ENCOUNTER — Ambulatory Visit
Admission: RE | Admit: 2020-01-22 | Discharge: 2020-01-22 | Disposition: A | Discharge status: Home / Self Care | Payer: Medicare Other | Source: Ambulatory Visit | Attending: Radiation Oncology | Admitting: Radiation Oncology

## 2020-01-22 DIAGNOSIS — D0512 Intraductal carcinoma in situ of left breast: Secondary | ICD-10-CM | POA: Diagnosis not present

## 2020-01-22 DIAGNOSIS — Z510 Encounter for antineoplastic radiation therapy: Secondary | ICD-10-CM | POA: Diagnosis not present

## 2020-01-23 ENCOUNTER — Other Ambulatory Visit: Payer: Self-pay

## 2020-01-23 ENCOUNTER — Ambulatory Visit
Admission: RE | Admit: 2020-01-23 | Discharge: 2020-01-23 | Disposition: A | Discharge status: Home / Self Care | Payer: Medicare Other | Source: Ambulatory Visit | Attending: Radiation Oncology | Admitting: Radiation Oncology

## 2020-01-23 DIAGNOSIS — D0512 Intraductal carcinoma in situ of left breast: Secondary | ICD-10-CM | POA: Diagnosis not present

## 2020-01-23 DIAGNOSIS — Z510 Encounter for antineoplastic radiation therapy: Secondary | ICD-10-CM | POA: Diagnosis not present

## 2020-01-26 ENCOUNTER — Ambulatory Visit
Admission: RE | Admit: 2020-01-26 | Discharge: 2020-01-26 | Disposition: A | Discharge status: Home / Self Care | Payer: Medicare Other | Source: Ambulatory Visit | Attending: Radiation Oncology | Admitting: Radiation Oncology

## 2020-01-26 ENCOUNTER — Other Ambulatory Visit: Payer: Self-pay

## 2020-01-26 ENCOUNTER — Inpatient Hospital Stay: Payer: Medicare Other | Attending: Radiation Oncology

## 2020-01-26 DIAGNOSIS — D0512 Intraductal carcinoma in situ of left breast: Secondary | ICD-10-CM | POA: Diagnosis not present

## 2020-01-26 DIAGNOSIS — Z510 Encounter for antineoplastic radiation therapy: Secondary | ICD-10-CM | POA: Insufficient documentation

## 2020-01-26 LAB — CBC
HCT: 40.6 % (ref 36.0–46.0)
Hemoglobin: 13.1 g/dL (ref 12.0–15.0)
MCH: 28.7 pg (ref 26.0–34.0)
MCHC: 32.3 g/dL (ref 30.0–36.0)
MCV: 89 fL (ref 80.0–100.0)
Platelets: 159 10*3/uL (ref 150–400)
RBC: 4.56 MIL/uL (ref 3.87–5.11)
RDW: 13.7 % (ref 11.5–15.5)
WBC: 3.5 10*3/uL — ABNORMAL LOW (ref 4.0–10.5)
nRBC: 0 % (ref 0.0–0.2)

## 2020-01-27 ENCOUNTER — Ambulatory Visit
Admission: RE | Admit: 2020-01-27 | Discharge: 2020-01-27 | Disposition: A | Discharge status: Home / Self Care | Payer: Medicare Other | Source: Ambulatory Visit | Attending: Radiation Oncology | Admitting: Radiation Oncology

## 2020-01-27 ENCOUNTER — Other Ambulatory Visit: Payer: Self-pay

## 2020-01-27 DIAGNOSIS — D0512 Intraductal carcinoma in situ of left breast: Secondary | ICD-10-CM | POA: Diagnosis not present

## 2020-01-27 DIAGNOSIS — Z510 Encounter for antineoplastic radiation therapy: Secondary | ICD-10-CM | POA: Diagnosis not present

## 2020-01-28 ENCOUNTER — Other Ambulatory Visit: Payer: Self-pay

## 2020-01-28 ENCOUNTER — Ambulatory Visit
Admission: RE | Admit: 2020-01-28 | Discharge: 2020-01-28 | Disposition: A | Discharge status: Home / Self Care | Payer: Medicare Other | Source: Ambulatory Visit | Attending: Radiation Oncology | Admitting: Radiation Oncology

## 2020-01-28 DIAGNOSIS — Z510 Encounter for antineoplastic radiation therapy: Secondary | ICD-10-CM | POA: Diagnosis not present

## 2020-01-28 DIAGNOSIS — D0512 Intraductal carcinoma in situ of left breast: Secondary | ICD-10-CM | POA: Diagnosis not present

## 2020-01-29 ENCOUNTER — Ambulatory Visit
Admission: RE | Admit: 2020-01-29 | Discharge: 2020-01-29 | Disposition: A | Discharge status: Home / Self Care | Payer: Medicare Other | Source: Ambulatory Visit | Attending: Radiation Oncology | Admitting: Radiation Oncology

## 2020-01-29 ENCOUNTER — Other Ambulatory Visit: Payer: Self-pay

## 2020-01-29 DIAGNOSIS — D0512 Intraductal carcinoma in situ of left breast: Secondary | ICD-10-CM | POA: Diagnosis not present

## 2020-01-29 DIAGNOSIS — Z510 Encounter for antineoplastic radiation therapy: Secondary | ICD-10-CM | POA: Diagnosis not present

## 2020-01-30 ENCOUNTER — Ambulatory Visit
Admission: RE | Admit: 2020-01-30 | Discharge: 2020-01-30 | Disposition: A | Discharge status: Home / Self Care | Payer: Medicare Other | Source: Ambulatory Visit | Attending: Radiation Oncology | Admitting: Radiation Oncology

## 2020-01-30 ENCOUNTER — Other Ambulatory Visit: Payer: Self-pay

## 2020-01-30 DIAGNOSIS — Z510 Encounter for antineoplastic radiation therapy: Secondary | ICD-10-CM | POA: Diagnosis not present

## 2020-01-30 DIAGNOSIS — D0512 Intraductal carcinoma in situ of left breast: Secondary | ICD-10-CM | POA: Diagnosis not present

## 2020-02-02 ENCOUNTER — Ambulatory Visit
Admission: RE | Admit: 2020-02-02 | Discharge: 2020-02-02 | Disposition: A | Discharge status: Home / Self Care | Payer: Medicare Other | Source: Ambulatory Visit | Attending: Radiation Oncology | Admitting: Radiation Oncology

## 2020-02-02 ENCOUNTER — Telehealth: Payer: Self-pay | Admitting: Hematology and Oncology

## 2020-02-02 ENCOUNTER — Other Ambulatory Visit: Payer: Self-pay

## 2020-02-02 DIAGNOSIS — Z510 Encounter for antineoplastic radiation therapy: Secondary | ICD-10-CM | POA: Diagnosis not present

## 2020-02-02 DIAGNOSIS — D0512 Intraductal carcinoma in situ of left breast: Secondary | ICD-10-CM | POA: Diagnosis not present

## 2020-02-02 NOTE — Telephone Encounter (Signed)
Scheduled appts per 3/8 los. Pt is aware of new appt date and time.

## 2020-02-03 ENCOUNTER — Encounter: Payer: Self-pay | Admitting: *Deleted

## 2020-02-03 ENCOUNTER — Other Ambulatory Visit: Payer: Self-pay

## 2020-02-03 ENCOUNTER — Ambulatory Visit
Admission: RE | Admit: 2020-02-03 | Discharge: 2020-02-03 | Disposition: A | Discharge status: Home / Self Care | Payer: Medicare Other | Source: Ambulatory Visit | Attending: Radiation Oncology | Admitting: Radiation Oncology

## 2020-02-03 DIAGNOSIS — D0512 Intraductal carcinoma in situ of left breast: Secondary | ICD-10-CM | POA: Diagnosis not present

## 2020-02-03 DIAGNOSIS — Z510 Encounter for antineoplastic radiation therapy: Secondary | ICD-10-CM | POA: Diagnosis not present

## 2020-02-04 DIAGNOSIS — D692 Other nonthrombocytopenic purpura: Secondary | ICD-10-CM | POA: Diagnosis not present

## 2020-02-04 DIAGNOSIS — L820 Inflamed seborrheic keratosis: Secondary | ICD-10-CM | POA: Diagnosis not present

## 2020-02-04 DIAGNOSIS — L650 Telogen effluvium: Secondary | ICD-10-CM | POA: Diagnosis not present

## 2020-02-04 DIAGNOSIS — L578 Other skin changes due to chronic exposure to nonionizing radiation: Secondary | ICD-10-CM | POA: Diagnosis not present

## 2020-02-04 NOTE — Progress Notes (Signed)
Patient Care Team: Unk Pinto, MD as PCP - General (Internal Medicine) Avon Gully, NP as Nurse Practitioner (Obstetrics and Gynecology) Clarene Essex, MD as Consulting Physician (Gastroenterology) Aplington, Laurice Record, MD (Inactive) as Consulting Physician (Orthopedic Surgery) Suella Broad, MD as Consulting Physician (Physical Medicine and Rehabilitation) Jari Pigg, MD as Consulting Physician (Dermatology) Nicholas Lose, MD as Consulting Physician (Hematology and Oncology) Alphonsa Overall, MD as Consulting Physician (General Surgery) Eppie Gibson, MD as Attending Physician (Radiation Oncology) Mauro Kaufmann, RN as Oncology Nurse Navigator Rockwell Germany, RN as Oncology Nurse Navigator  DIAGNOSIS:    ICD-10-CM   1. Ductal carcinoma in situ (DCIS) of left breast  D05.12     SUMMARY OF ONCOLOGIC HISTORY: Oncology History  Ductal carcinoma in situ (DCIS) of left breast  11/17/2019 Initial Diagnosis   Screening mammogram detected left breast density UOQ 5 mm at 10 o'clock position.  Stereotactic biopsy revealed intermediate grade DCIS ER/PR positive   11/19/2019 Cancer Staging   Staging form: Breast, AJCC 8th Edition - Clinical stage from 11/19/2019: Stage 0 (cTis (DCIS), cN0, cM0, G2, ER+, PR+, HER2: Not Assessed) - Signed by Nicholas Lose, MD on 11/19/2019   12/05/2019 Surgery   Left lumpectomy Lucia Gaskins): DCIS, intermediate grade, 0.3cm, clear margins.   12/05/2019 Cancer Staging   Staging form: Breast, AJCC 8th Edition - Pathologic stage from 12/05/2019: Stage 0 (pTis (DCIS), pN0, cM0, ER+, PR+) - Signed by Gardenia Phlegm, NP on 12/17/2019   12/31/2019 - 02/03/2020 Radiation Therapy   Adjuvant radiation at [facility] COMPLIANT: Follow-up of left breast DCIS to discuss antiestrogen therapy  INTERVAL HISTORY: [name] is a 69 y.o. with above-mentioned history of left breast DCIS for which she underwent a left lumpectomy and completed radiation in  [place] on 02/03/20. She presents to the clinic today to discuss antiestrogen treatment.   She currently has lots of fatigue as well as radiation dermatitis.  ALLERGIES:  has No Known Allergies.  MEDICATIONS:  Current Outpatient Medications  Medication Sig Dispense Refill  . allopurinol (ZYLOPRIM) 300 MG tablet Take 1 tablet Daily to Prevent Gout (Patient taking differently: Take 150 mg by mouth daily. Take 1 tablet Daily to Prevent Gout) 90 tablet 1  . ALPRAZolam (XANAX) 0.5 MG tablet Take 1/2 to 1 tablet 2 to 3 x /day as needed for Anxiety 90 tablet 0  . Ascorbic Acid (VITAMIN C) 1000 MG tablet Take 1,000 mg by mouth daily.    . calcium carbonate (OSCAL) 1500 (600 Ca) MG TABS tablet Take by mouth daily with breakfast.    . Cholecalciferol (VITAMIN D PO) Take 10,000 Units by mouth daily.    . Cyanocobalamin (VITAMIN B-12 PO) Take 1 tablet by mouth daily.    . fenofibrate (TRICOR) 145 MG tablet Take 1 tablet Daily For Triglycerides (Blood Fats) 90 tablet 3  . fluticasone (FLONASE) 50 MCG/ACT nasal spray Use 1 to 2 sprays each Nares 1 to 2 x /day 48 g 3  . loratadine (CLARITIN) 10 MG tablet Take 1 tablet (10 mg total) by mouth daily. 90 tablet 1  . nitrofurantoin, macrocrystal-monohydrate, (MACROBID) 100 MG capsule Take 1 capsule 2 x /day with food for UTI 20 capsule 0  . phentermine (ADIPEX-P) 37.5 MG tablet Take 1 tablet every morning for Dieting & Weight Loss 90 tablet 1  . pravastatin (PRAVACHOL) 20 MG tablet Take  1 tablet at Bedtime for Cholesterol 90 tablet 2  . pyridOXINE (VITAMIN B-6) 100 MG tablet  Take 100 mg by mouth daily.    . traMADol (ULTRAM) 50 MG tablet Take 1 tablet (50 mg total) by mouth every 6 (six) hours as needed for moderate pain or severe pain. 12 tablet 1  . triamterene-hydrochlorothiazide (MAXZIDE-25) 37.5-25 MG tablet Take 1/2 to 1 tablet Daily for BP & Fluid Retention 90 tablet 3  . zinc gluconate 50 MG tablet Take 50 mg by mouth daily.     No current  facility-administered medications for this visit.    PHYSICAL EXAMINATION: ECOG PERFORMANCE STATUS: 1 - Symptomatic but completely ambulatory  Vitals:   02/05/20 0914  BP: 136/73  Pulse: 76  Resp: 17  Temp: 97.7 F (36.5 C)  SpO2: 99%   Filed Weights   02/05/20 0914  Weight: 154 lb 8 oz (70.1 kg)    LABORATORY DATA:  I have reviewed the data as listed CMP Latest Ref Rng & Units 11/19/2019 10/27/2019 05/26/2019  Glucose 70 - 99 mg/dL 105(H) 117(H) 88  BUN 8 - 23 mg/dL '17 17 20  '$ Creatinine 0.44 - 1.00 mg/dL 0.91 0.78 1.00(H)  Sodium 135 - 145 mmol/L 143 138 141  Potassium 3.5 - 5.1 mmol/L 3.8 3.1(L) 3.6  Chloride 98 - 111 mmol/L 104 100 102  CO2 22 - 32 mmol/L 31 25 32  Calcium 8.9 - 10.3 mg/dL 10.0 8.9 10.2  Total Protein 6.5 - 8.1 g/dL 7.0 - 6.6  Total Bilirubin 0.3 - 1.2 mg/dL 0.4 - 0.5  Alkaline Phos 38 - 126 U/L 76 - -  AST 15 - 41 U/L 19 - 21  ALT 0 - 44 U/L 14 - 19    Lab Results  Component Value Date   WBC 3.5 (L) 01/26/2020   HGB 13.1 01/26/2020   HCT 40.6 01/26/2020   MCV 89.0 01/26/2020   PLT 159 01/26/2020   NEUTROABS 2.6 11/19/2019    ASSESSMENT & PLAN:  Ductal carcinoma in situ (DCIS) of left breast 12/05/2019: Left lumpectomy by Dr. Lucia Gaskins: Intermediate grade DCIS, 0.3 cm, margins clear, ER 100%, PR 90% Staging: Tis NX stage 0  Treatment plan: 1.  Adjuvant radiation therapy at [facility] completed 02/03/20 2.  Adjuvant antiestrogen therapy with tamoxifen 20 mg daily x5 years --------------------------------------------------------------------------------------------------------------------------------------------------- Tamoxifen Counseling: We discussed the risks and benefits of tamoxifen. These include but not limited to insomnia, hot flashes, mood changes, vaginal dryness, and weight gain. Although rare, serious side effects including endometrial cancer, risk of blood clots were also discussed. We strongly believe that the benefits far outweigh the  risks. Patient understands these risks and consented to starting treatment. Planned treatment duration is 5 years. After lengthy discussion about pros and cons of antiestrogen therapy, she agreed to take the 5 mg dosage of tamoxifen.  This is based on the TAM 01 clinical trial.  Fatigue and radiation dermatitis: I counseled her that it will get better with time.  She consented to participate in the antiestrogen therapy adherence study  RTC in 3 months for SCP visit   No orders of the defined types were placed in this encounter.  The patient has a good understanding of the overall plan. she agrees with it. she will call with any problems that may develop before the next visit here.  Total time spent: 20 mins including face to face time and time spent for planning, charting and coordination of care  Nicholas Lose, MD 02/05/2020  I, Cloyde Reams Dorshimer, am acting as scribe for Dr. Nicholas Lose.  I have reviewed the above documentation  for accuracy and completeness, and I agree with the above.

## 2020-02-05 ENCOUNTER — Other Ambulatory Visit: Payer: Self-pay

## 2020-02-05 ENCOUNTER — Encounter (INDEPENDENT_AMBULATORY_CARE_PROVIDER_SITE_OTHER): Payer: Self-pay

## 2020-02-05 ENCOUNTER — Inpatient Hospital Stay: Payer: Medicare Other | Attending: Hematology and Oncology | Admitting: Hematology and Oncology

## 2020-02-05 ENCOUNTER — Telehealth: Payer: Self-pay | Admitting: Hematology and Oncology

## 2020-02-05 DIAGNOSIS — D0512 Intraductal carcinoma in situ of left breast: Secondary | ICD-10-CM | POA: Insufficient documentation

## 2020-02-05 DIAGNOSIS — Z170 Estrogen receptor positive status [ER+]: Secondary | ICD-10-CM | POA: Diagnosis not present

## 2020-02-05 MED ORDER — ASPIRIN EC 81 MG PO TBEC: 81.0000 mg | DELAYED_RELEASE_TABLET | Freq: Every day | ORAL | Status: DC

## 2020-02-05 MED ORDER — TAMOXIFEN CITRATE 10 MG PO TABS: 5.0000 mg | ORAL_TABLET | Freq: Every day | ORAL | 1 refills | Status: DC

## 2020-02-05 NOTE — Assessment & Plan Note (Signed)
12/05/2019: Left lumpectomy by Dr. Lucia Gaskins: Intermediate grade DCIS, 0.3 cm, margins clear, ER 100%, PR 90% Staging: Tis NX stage 0  Treatment plan: 1.  Adjuvant radiation therapy at [facility] completed 02/03/20 2.  Adjuvant antiestrogen therapy with tamoxifen 20 mg daily x5 years --------------------------------------------------------------------------------------------------------------------------------------------------- Tamoxifen Counseling: We discussed the risks and benefits of tamoxifen. These include but not limited to insomnia, hot flashes, mood changes, vaginal dryness, and weight gain. Although rare, serious side effects including endometrial cancer, risk of blood clots were also discussed. We strongly believe that the benefits far outweigh the risks. Patient understands these risks and consented to starting treatment. Planned treatment duration is 5 years.  RTC in 3 months for SCP visit

## 2020-02-05 NOTE — Telephone Encounter (Signed)
I talk with patient regarding schedule  

## 2020-02-12 ENCOUNTER — Other Ambulatory Visit: Payer: Self-pay | Admitting: Adult Health

## 2020-02-12 ENCOUNTER — Encounter: Payer: Self-pay | Admitting: Hematology and Oncology

## 2020-02-12 ENCOUNTER — Encounter (INDEPENDENT_AMBULATORY_CARE_PROVIDER_SITE_OTHER): Payer: Self-pay

## 2020-02-12 ENCOUNTER — Encounter: Payer: Self-pay | Admitting: Adult Health

## 2020-02-12 DIAGNOSIS — D0512 Intraductal carcinoma in situ of left breast: Secondary | ICD-10-CM

## 2020-02-12 DIAGNOSIS — R5383 Other fatigue: Secondary | ICD-10-CM

## 2020-02-12 DIAGNOSIS — E559 Vitamin D deficiency, unspecified: Secondary | ICD-10-CM

## 2020-02-13 ENCOUNTER — Telehealth: Payer: Self-pay | Admitting: Adult Health

## 2020-02-13 NOTE — Telephone Encounter (Signed)
Called and talked to patient.  She is going to trial 2 weeks off of Tamoxifen.  At that point she will call us and let us know if she wants to try every other day.    Wilber Bihari, NP

## 2020-02-15 NOTE — Progress Notes (Signed)
HEMATOLOGY-ONCOLOGY TELEPHONE VISIT PROGRESS NOTE  I connected with [name] on 02/16/2020 at  9:15 AM EDT by telephone and verified that I am speaking with the correct person using two identifiers.  I discussed the limitations, risks, security and privacy concerns of performing an evaluation and management service by telephone and the availability of in person appointments.  I also discussed with the patient that there may be a patient responsible charge related to this service. The patient expressed understanding and agreed to proceed.   History of Present Illness: [name] is a 69 y.o. female with above-mentioned history of left breast DCIS treated with a left lumpectomy, radiation, and is currently on tamoxifen 18m daily. She presents over the phone todayto discuss side effects.   She could not tolerate even 5 mg of tamoxifen because of severe side effects these included blurred vision dizziness mental fogginess to the point that she was very scared of these symptoms and had to call her husband.  She also had some degree of hot flashes.  Once she stopped the medication the symptoms got better after 3 days.  She is calling today to discuss the next steps with regards to tamoxifen.  Oncology History  Ductal carcinoma in situ (DCIS) of left breast  11/17/2019 Initial Diagnosis   Screening mammogram detected left breast density UOQ 5 mm at 10 o'clock position.  Stereotactic biopsy revealed intermediate grade DCIS ER/PR positive   11/19/2019 Cancer Staging   Staging form: Breast, AJCC 8th Edition - Clinical stage from 11/19/2019: Stage 0 (cTis (DCIS), cN0, cM0, G2, ER+, PR+, HER2: Not Assessed) - Signed by GNicholas Lose MD on 11/19/2019   12/05/2019 Surgery   Left lumpectomy (Lucia Gaskins: DCIS, intermediate grade, 0.3cm, clear margins.   12/05/2019 Cancer Staging   Staging form: Breast, AJCC 8th Edition - Pathologic stage from 12/05/2019: Stage 0 (pTis (DCIS), pN0, cM0, ER+, PR+) - Signed  by CGardenia Phlegm NP on 12/17/2019   12/31/2019 - 02/03/2020 Radiation Therapy   [facility]  [facility] -  Anti-estrogen oral therapy   Tamoxifen, 534mdaily     Observations/Objective:     Assessment Plan:  Ductal carcinoma in situ (DCIS) of left breast 12/05/2019: Left lumpectomy by Dr. NeLucia GaskinsIntermediate grade DCIS, 0.3 cm, margins clear, ER 100%, PR 90% Staging: Tis NX stage 0  [facility]: [facility] 2.Adjuvant antiestrogen therapy with tamoxifen (started 5 mg now taking 5 mg every other day) --------------------------------------------------------------------------------------------------------------------------------------------------- Tamoxifen toxicities Severe debilitating side effects which include dizziness lightheadedness mental fogginess confusion fatigue irritability hot flashes etc. Patient could not tolerate 5 mg daily.   Based on the side effects we discontinued it.  She will come back in June for survivorship care plan visit after that we can see her once a year for follow-ups.  I discussed the assessment and treatment plan with the patient. The patient was provided an opportunity to ask questions and all were answered. The patient agreed with the plan and demonstrated an understanding of the instructions. The patient was advised to call back or seek an in-person evaluation if the symptoms worsen or if the condition fails to improve as anticipated.   I provided 12 minutes of non-face-to-face time during this encounter.   ViRulon EisenmengerMD 02/16/2020    I, Molly Dorshimer, am acting as scribe for ViNicholas LoseMD.  I have reviewed the above documentation for accuracy and completeness, and I agree with the above.

## 2020-02-16 ENCOUNTER — Inpatient Hospital Stay (HOSPITAL_BASED_OUTPATIENT_CLINIC_OR_DEPARTMENT_OTHER): Payer: Medicare Other | Admitting: Hematology and Oncology

## 2020-02-16 ENCOUNTER — Encounter: Payer: Self-pay | Admitting: Radiation Oncology

## 2020-02-16 DIAGNOSIS — D0512 Intraductal carcinoma in situ of left breast: Secondary | ICD-10-CM | POA: Diagnosis not present

## 2020-02-16 NOTE — Assessment & Plan Note (Signed)
12/05/2019: Left lumpectomy by Dr. Lucia Gaskins: Intermediate grade DCIS, 0.3 cm, margins clear, ER 100%, PR 90% Staging: Tis NX stage 0  Treatment plan: 1.Adjuvant radiation therapy at [facility] completed 02/03/20 2.Adjuvant antiestrogen therapy with tamoxifen (started 5 mg now taking 5 mg every other day) --------------------------------------------------------------------------------------------------------------------------------------------------- Tamoxifen toxicities  Patient could not tolerate 5 mg daily.  So she is currently taking 5 mg every other day.

## 2020-02-17 DIAGNOSIS — H524 Presbyopia: Secondary | ICD-10-CM | POA: Diagnosis not present

## 2020-02-17 DIAGNOSIS — I10 Essential (primary) hypertension: Secondary | ICD-10-CM | POA: Diagnosis not present

## 2020-02-17 DIAGNOSIS — H35033 Hypertensive retinopathy, bilateral: Secondary | ICD-10-CM | POA: Diagnosis not present

## 2020-02-17 DIAGNOSIS — H40033 Anatomical narrow angle, bilateral: Secondary | ICD-10-CM | POA: Diagnosis not present

## 2020-02-17 DIAGNOSIS — H2513 Age-related nuclear cataract, bilateral: Secondary | ICD-10-CM | POA: Diagnosis not present

## 2020-02-17 DIAGNOSIS — E7800 Pure hypercholesterolemia, unspecified: Secondary | ICD-10-CM | POA: Diagnosis not present

## 2020-02-19 ENCOUNTER — Encounter (INDEPENDENT_AMBULATORY_CARE_PROVIDER_SITE_OTHER): Payer: Self-pay

## 2020-02-25 ENCOUNTER — Ambulatory Visit: Payer: Medicare Other | Admitting: Adult Health

## 2020-02-26 ENCOUNTER — Encounter (INDEPENDENT_AMBULATORY_CARE_PROVIDER_SITE_OTHER): Payer: Self-pay

## 2020-02-28 ENCOUNTER — Other Ambulatory Visit: Payer: Self-pay | Admitting: Internal Medicine

## 2020-02-28 DIAGNOSIS — M109 Gout, unspecified: Secondary | ICD-10-CM

## 2020-02-28 MED ORDER — ALLOPURINOL 300 MG PO TABS: ORAL_TABLET | ORAL | 0 refills | Status: DC

## 2020-03-02 ENCOUNTER — Encounter: Payer: Self-pay | Admitting: Radiation Oncology

## 2020-03-02 ENCOUNTER — Other Ambulatory Visit: Payer: Self-pay

## 2020-03-02 ENCOUNTER — Ambulatory Visit
Admission: RE | Admit: 2020-03-02 | Discharge: 2020-03-02 | Disposition: A | Discharge status: Home / Self Care | Payer: Medicare Other | Source: Ambulatory Visit | Attending: Radiation Oncology | Admitting: Radiation Oncology

## 2020-03-02 DIAGNOSIS — Z170 Estrogen receptor positive status [ER+]: Secondary | ICD-10-CM | POA: Insufficient documentation

## 2020-03-02 DIAGNOSIS — Z923 Personal history of irradiation: Secondary | ICD-10-CM | POA: Insufficient documentation

## 2020-03-02 DIAGNOSIS — D0512 Intraductal carcinoma in situ of left breast: Secondary | ICD-10-CM | POA: Insufficient documentation

## 2020-03-02 DIAGNOSIS — Z79810 Long term (current) use of selective estrogen receptor modulators (SERMs): Secondary | ICD-10-CM | POA: Diagnosis not present

## 2020-03-02 NOTE — Progress Notes (Signed)
Radiation Oncology Follow up Note  Name: [name]   Date:   03/02/2020 MRN:  [id number] DOB: [birth year]    This 69 y.o. female presents to the clinic today for whole breast radiation to her left breast for ER/PR positive ductal carcinoma in situ.  REFERRING PROVIDER: Unk Pinto, MD  HPI: Patient is a 69 year old female now at 1 month having completed whole breast radiation to her left breast for ER/PR positive ductal carcinoma in situ.  She is seen today in routine follow-up and is doing well.  She has noticed some marked shrinkage of her left breast.  Specifically denies breast tenderness cough or bone pain.  She was started on tamoxifen although had tremendous side effects and that has been discontinued and she is currently on hold from antiestrogen therapy..  COMPLICATIONS OF TREATMENT: none  FOLLOW UP COMPLIANCE: keeps appointments   PHYSICAL EXAM:  BP (P) 136/79 (BP Location: Left Arm, Patient Position: Sitting)   Pulse (P) 88   Temp (!) (P) 97.3 F (36.3 C) (Tympanic)   Resp (P) 18   Wt (P) 153 lb 1.6 oz (69.4 kg)   BMI (P) 23.98 kg/m  Lungs are clear to A&P cardiac examination essentially unremarkable with regular rate and rhythm. No dominant mass or nodularity is noted in either breast in 2 positions examined. Incision is well-healed. No axillary or supraclavicular adenopathy is appreciated. Cosmetic result is excellent.  Slight asymmetry of the left breast although cosmetic result is still good to excellent.  Well-developed well-nourished patient in NAD. HEENT reveals PERLA, EOMI, discs not visualized.  Oral cavity is clear. No oral mucosal lesions are identified. Neck is clear without evidence of cervical or supraclavicular adenopathy. Lungs are clear to A&P. Cardiac examination is essentially unremarkable with regular rate and rhythm without murmur rub or thrill. Abdomen is benign with no organomegaly or masses noted. Motor sensory and DTR levels are equal and symmetric  in the upper and lower extremities. Cranial nerves II through XII are grossly intact. Proprioception is intact. No peripheral adenopathy or edema is identified. No motor or sensory levels are noted. Crude visual fields are within normal range.  RADIOLOGY RESULTS: No current films for review  PLAN: Present time from radiation standpoint she is doing well.  She has good to excellent cosmetic result.  I have asked to see her back in 4 to 5 months for follow-up.  We will leave it to medical oncology decide on further antiestrogen therapy.  Patient knows to call with any concerns.  I would like to take this opportunity to thank you for allowing me to participate in the care of your patient.Noreene Filbert, MD

## 2020-03-03 DIAGNOSIS — H40039 Anatomical narrow angle, unspecified eye: Secondary | ICD-10-CM | POA: Diagnosis not present

## 2020-03-03 DIAGNOSIS — H40033 Anatomical narrow angle, bilateral: Secondary | ICD-10-CM | POA: Diagnosis not present

## 2020-03-04 ENCOUNTER — Ambulatory Visit: Payer: Medicare Other | Admitting: Radiation Oncology

## 2020-03-04 ENCOUNTER — Encounter (INDEPENDENT_AMBULATORY_CARE_PROVIDER_SITE_OTHER): Payer: Self-pay

## 2020-03-10 DIAGNOSIS — H40039 Anatomical narrow angle, unspecified eye: Secondary | ICD-10-CM | POA: Diagnosis not present

## 2020-03-11 ENCOUNTER — Encounter (INDEPENDENT_AMBULATORY_CARE_PROVIDER_SITE_OTHER): Payer: Self-pay

## 2020-03-15 ENCOUNTER — Other Ambulatory Visit: Payer: Self-pay

## 2020-03-15 ENCOUNTER — Ambulatory Visit (INDEPENDENT_AMBULATORY_CARE_PROVIDER_SITE_OTHER): Payer: Medicare Other | Admitting: Dermatology

## 2020-03-15 DIAGNOSIS — L650 Telogen effluvium: Secondary | ICD-10-CM | POA: Diagnosis not present

## 2020-03-15 DIAGNOSIS — L821 Other seborrheic keratosis: Secondary | ICD-10-CM

## 2020-03-15 DIAGNOSIS — L820 Inflamed seborrheic keratosis: Secondary | ICD-10-CM

## 2020-03-15 NOTE — Progress Notes (Signed)
   Follow-Up Visit   Subjective  [name] is a 69 y.o. female who presents for the following: Follow-up (Inflamed SKs of arms and hands treated with LN2 x >30.) and Other (Telogen Effluvium follow up - stopped Rogaine due to make hair oily but hair is still coming out. Wants to know if HairMax light is worth the investment.).    The following portions of the chart were reviewed this encounter and updated as appropriate: Tobacco  Allergies  Meds  Problems  Med Hx  Surg Hx  Fam Hx      Review of Systems: No other skin or systemic complaints.  Objective  Well appearing patient in no apparent distress; mood and affect are within normal limits.  A focused examination was performed including scalp, hands, arms. Relevant physical exam findings are noted in the Assessment and Plan.  Objective  Arms, hands (28): Erythematous keratotic or waxy stuck-on papule or plaque.   Objective  Arms and hands: Stuck-on, waxy, tan-brown papules and plaques -- Discussed benign etiology and prognosis.   Objective  Scalp: Diffuse thinning of hair, positive hair pull test.   Assessment & Plan  Inflamed seborrheic keratosis (28) Arms, hands  Destruction of lesion - Arms, hands Complexity: simple   Destruction method: cryotherapy   Informed consent: discussed and consent obtained   Timeout:  patient name, date of birth, surgical site, and procedure verified Lesion destroyed using liquid nitrogen: Yes   Region frozen until ice ball extended beyond lesion: Yes   Outcome: patient tolerated procedure well with no complications   Post-procedure details: wound care instructions given    Seborrheic keratosis Arms and hands  Observe   Telogen effluvium Scalp  Discussed HairMax light treatment. Patient has read good reviews and is planning to invest in the headband light treatment.   May restart Rogaine qd-bid.  Return in about 8 weeks (around 05/10/2020).   I, Ashok Cordia, CMA, am  acting as scribe for Sarina Ser, MD .  Documentation: I have reviewed the above documentation for accuracy and completeness, and I agree with the above.  Sarina Ser, MD

## 2020-03-15 NOTE — Patient Instructions (Signed)

## 2020-03-16 ENCOUNTER — Encounter: Payer: Self-pay | Admitting: Dermatology

## 2020-03-17 DIAGNOSIS — J301 Allergic rhinitis due to pollen: Secondary | ICD-10-CM | POA: Diagnosis not present

## 2020-03-17 DIAGNOSIS — J019 Acute sinusitis, unspecified: Secondary | ICD-10-CM | POA: Diagnosis not present

## 2020-03-18 ENCOUNTER — Encounter (INDEPENDENT_AMBULATORY_CARE_PROVIDER_SITE_OTHER): Payer: Self-pay

## 2020-03-26 DIAGNOSIS — J301 Allergic rhinitis due to pollen: Secondary | ICD-10-CM | POA: Diagnosis not present

## 2020-03-29 ENCOUNTER — Ambulatory Visit: Payer: Medicare Other | Admitting: Podiatry

## 2020-03-29 DIAGNOSIS — J301 Allergic rhinitis due to pollen: Secondary | ICD-10-CM | POA: Diagnosis not present

## 2020-03-30 ENCOUNTER — Ambulatory Visit (INDEPENDENT_AMBULATORY_CARE_PROVIDER_SITE_OTHER): Payer: Medicare Other | Admitting: Podiatry

## 2020-03-30 ENCOUNTER — Other Ambulatory Visit: Payer: Self-pay

## 2020-03-30 DIAGNOSIS — L989 Disorder of the skin and subcutaneous tissue, unspecified: Secondary | ICD-10-CM

## 2020-03-30 DIAGNOSIS — M7751 Other enthesopathy of right foot: Secondary | ICD-10-CM | POA: Diagnosis not present

## 2020-04-01 ENCOUNTER — Encounter (INDEPENDENT_AMBULATORY_CARE_PROVIDER_SITE_OTHER): Payer: Self-pay

## 2020-04-01 DIAGNOSIS — J301 Allergic rhinitis due to pollen: Secondary | ICD-10-CM | POA: Diagnosis not present

## 2020-04-05 DIAGNOSIS — J301 Allergic rhinitis due to pollen: Secondary | ICD-10-CM | POA: Diagnosis not present

## 2020-04-06 DIAGNOSIS — J301 Allergic rhinitis due to pollen: Secondary | ICD-10-CM | POA: Diagnosis not present

## 2020-04-06 NOTE — Progress Notes (Signed)
   Subjective: 69 y.o. female presenting to the office today for follow up evaluation of right foot pain. She reports continued pain and would like another injection since it helped alleviate her symptoms at the last visit. She also complains of a painful callus lesion noted to the right foot. Walking and bearing weight increases the pain. She has not done anything at home for treatment. Patient is here for further evaluation and treatment.   Past Medical History:  Diagnosis Date  . Allergy   . Anxiety   . Bell palsy 02/04/2016   Left  . Cancer ([place]) 2011   BCC  . Complication of anesthesia   . Hyperlipidemia   . Hypertension 2005  . Plantar fasciitis   . PONV (postoperative nausea and vomiting)      Objective:  Physical Exam General: Alert and oriented x3 in no acute distress  Dermatology: Hyperkeratotic lesion(s) present on the right foot. Pain on palpation with a central nucleated core noted. Skin is warm, dry and supple bilateral lower extremities. Negative for open lesions or macerations.  Vascular: Palpable pedal pulses bilaterally. No edema or erythema noted. Capillary refill within normal limits.  Neurological: Epicritic and protective threshold grossly intact bilaterally.   Musculoskeletal Exam: Pain on palpation at the keratotic lesion(s) noted. Pain with palpation noted to the 3rd MPJ of the right foot. Range of motion within normal limits bilateral. Muscle strength 5/5 in all groups bilateral.  Assessment: 1. 3rd MPJ capsulitis right  2. Pre-ulcerative callus lesion right   Plan of Care:  1. Patient evaluated 2. Excisional debridement of keratoic lesion(s) using a chisel blade was performed without incident.  3. Dressed area with light dressing. 4. Injection of 0.5 mLs Celestone Soluspan injected into the 3rd MPJ of the right foot.  5. Patient is to return to the clinic PRN.   Edrick Kins, DPM [facility]  Dr. Edrick Kins, [address]                                        [place], [place] [address]                Office [phone]  Fax [phone]

## 2020-04-07 DIAGNOSIS — H40033 Anatomical narrow angle, bilateral: Secondary | ICD-10-CM | POA: Diagnosis not present

## 2020-04-07 DIAGNOSIS — H2513 Age-related nuclear cataract, bilateral: Secondary | ICD-10-CM | POA: Diagnosis not present

## 2020-04-08 DIAGNOSIS — J301 Allergic rhinitis due to pollen: Secondary | ICD-10-CM | POA: Diagnosis not present

## 2020-04-12 DIAGNOSIS — J301 Allergic rhinitis due to pollen: Secondary | ICD-10-CM | POA: Diagnosis not present

## 2020-04-15 DIAGNOSIS — J301 Allergic rhinitis due to pollen: Secondary | ICD-10-CM | POA: Diagnosis not present

## 2020-04-19 DIAGNOSIS — J301 Allergic rhinitis due to pollen: Secondary | ICD-10-CM | POA: Diagnosis not present

## 2020-04-25 ENCOUNTER — Other Ambulatory Visit: Payer: Self-pay | Admitting: Internal Medicine

## 2020-04-25 MED ORDER — TOPIRAMATE 50 MG PO TABS: ORAL_TABLET | ORAL | 1 refills | Status: DC

## 2020-04-29 DIAGNOSIS — J301 Allergic rhinitis due to pollen: Secondary | ICD-10-CM | POA: Diagnosis not present

## 2020-05-03 DIAGNOSIS — J301 Allergic rhinitis due to pollen: Secondary | ICD-10-CM | POA: Diagnosis not present

## 2020-05-06 DIAGNOSIS — J301 Allergic rhinitis due to pollen: Secondary | ICD-10-CM | POA: Diagnosis not present

## 2020-05-10 ENCOUNTER — Ambulatory Visit: Payer: Medicare Other | Admitting: Dermatology

## 2020-05-10 DIAGNOSIS — J301 Allergic rhinitis due to pollen: Secondary | ICD-10-CM | POA: Diagnosis not present

## 2020-05-11 ENCOUNTER — Other Ambulatory Visit: Payer: Self-pay

## 2020-05-11 ENCOUNTER — Ambulatory Visit (INDEPENDENT_AMBULATORY_CARE_PROVIDER_SITE_OTHER): Payer: Medicare Other | Admitting: Dermatology

## 2020-05-11 DIAGNOSIS — L820 Inflamed seborrheic keratosis: Secondary | ICD-10-CM | POA: Diagnosis not present

## 2020-05-11 DIAGNOSIS — L821 Other seborrheic keratosis: Secondary | ICD-10-CM

## 2020-05-11 DIAGNOSIS — L578 Other skin changes due to chronic exposure to nonionizing radiation: Secondary | ICD-10-CM

## 2020-05-11 NOTE — Patient Instructions (Signed)

## 2020-05-11 NOTE — Progress Notes (Signed)
   Follow-Up Visit   Subjective  [name] is a 69 y.o. female who presents for the following: Follow-up (Inflamed SK follow up of arms treated with LN2. Some of them need to be touched up.).  The following portions of the chart were reviewed this encounter and updated as appropriate:  Tobacco  Allergies  Meds  Problems  Med Hx  Surg Hx  Fam Hx      Review of Systems:  No other skin or systemic complaints except as noted in HPI or Assessment and Plan.  Objective  Well appearing patient in no apparent distress; mood and affect are within normal limits.  A focused examination was performed including arms. Relevant physical exam findings are noted in the Assessment and Plan.  Objective  Arms (40): Erythematous keratotic or waxy stuck-on papule or plaque.    Assessment & Plan    Actinic Damage - diffuse scaly erythematous macules with underlying dyspigmentation - Recommend daily broad spectrum sunscreen SPF 30+ to sun-exposed areas, reapply every 2 hours as needed.  - Call for new or changing lesions.  Seborrheic Keratoses - Stuck-on, waxy, tan-brown papules and plaques  - Discussed benign etiology and prognosis. - Observe - Call for any changes   Inflamed seborrheic keratosis (40) Arms  Destruction of lesion - Arms Complexity: simple   Destruction method: cryotherapy   Informed consent: discussed and consent obtained   Timeout:  patient name, date of birth, surgical site, and procedure verified Lesion destroyed using liquid nitrogen: Yes   Region frozen until ice ball extended beyond lesion: Yes   Outcome: patient tolerated procedure well with no complications   Post-procedure details: wound care instructions given    Return in about 8 weeks (around 07/06/2020).   I, Ashok Cordia, CMA, am acting as scribe for Sarina Ser, MD .  Documentation: I have reviewed the above documentation for accuracy and completeness, and I agree with the above.  Sarina Ser, MD

## 2020-05-13 DIAGNOSIS — J301 Allergic rhinitis due to pollen: Secondary | ICD-10-CM | POA: Diagnosis not present

## 2020-05-15 ENCOUNTER — Encounter: Payer: Self-pay | Admitting: Dermatology

## 2020-05-18 ENCOUNTER — Inpatient Hospital Stay: Payer: Medicare Other | Attending: Hematology and Oncology | Admitting: Adult Health

## 2020-05-18 DIAGNOSIS — D0512 Intraductal carcinoma in situ of left breast: Secondary | ICD-10-CM

## 2020-05-18 NOTE — Progress Notes (Signed)
SURVIVORSHIP VISIT: I connected with [name] on 05/18/20 at 10:30 AM EDT by telephone and verified that I am speaking with the correct person using two identifiers.  I discussed the limitations, risks, security and privacy concerns of performing an evaluation and management service by telephone and the availability of in person appointments.  I also discussed with the patient that there may be a patient responsible charge related to this service. The patient expressed understanding and agreed to proceed.    BRIEF ONCOLOGIC HISTORY:  Oncology History  Ductal carcinoma in situ (DCIS) of left breast  11/17/2019 Initial Diagnosis   Screening mammogram detected left breast density UOQ 5 mm at 10 o'clock position.  Stereotactic biopsy revealed intermediate grade DCIS ER/PR positive   11/19/2019 Cancer Staging   Staging form: Breast, AJCC 8th Edition - Clinical stage from 11/19/2019: Stage 0 (cTis (DCIS), cN0, cM0, G2, ER+, PR+, HER2: Not Assessed)   12/05/2019 Surgery   Left lumpectomy Lucia Gaskins) [phone]): DCIS, intermediate grade, 0.3cm, clear margins. No regional lymph nodes examined.   12/05/2019 Cancer Staging   Staging form: Breast, AJCC 8th Edition - Pathologic stage from 12/05/2019: Stage 0 (pTis (DCIS), pN0, cM0, ER+, PR+)   12/31/2019 - 02/03/2020 Radiation Therapy   Adjuvant radiation at [facility]   01/2020 - 01/2025 Anti-estrogen oral therapy   Tamoxifen 5 mg daily, discontinued due to severe debilitating side effects which included dizziness, lightheadedness, mental fogginess, irritability, hot flashes, etc.  Pt is now taking Tamoxifen 5 mg every other day.     INTERVAL HISTORY:  [name] to review her survivorship care plan detailing her treatment course for breast cancer, as well as monitoring long-term side effects of that treatment, education regarding health maintenance, screening, and overall wellness and health promotion.     Overall, [name] reports feeling quite  well.  She completed a survivorship survey which noted mild pain of 1/10 in her axilla when raising her arm, sinus issues that she is working on that are affecting her sleep.  She is exercising and eating healthy.  Overall she feels quite well.    REVIEW OF SYSTEMS:  Review of Systems  Constitutional: Negative for appetite change, chills, fatigue and unexpected weight change.  HENT:   Negative for hearing loss, lump/mass, sore throat and trouble swallowing.   Eyes: Negative for eye problems and icterus.  Respiratory: Negative for chest tightness, cough and shortness of breath.   Cardiovascular: Negative for chest pain, leg swelling and palpitations.  Gastrointestinal: Negative for abdominal distention, abdominal pain, constipation, diarrhea, nausea and vomiting.  Endocrine: Negative for hot flashes.  Musculoskeletal: Negative for arthralgias.  Skin: Negative for itching and rash.  Neurological: Negative for dizziness, extremity weakness, headaches and numbness.  Hematological: Negative for adenopathy. Does not bruise/bleed easily.  Psychiatric/Behavioral: Negative for depression. The patient is not nervous/anxious.   Breast: Denies any new nodularity, masses, tenderness, nipple changes, or nipple discharge.      ONCOLOGY TREATMENT TEAM:  1. Surgeon:  Dr. Lucia Gaskins at [facility] 2. Medical Oncologist: Dr. Lindi Adie  3. Radiation Oncologist: Dr. Baruch Gouty    PAST MEDICAL/SURGICAL HISTORY:  Past Medical History:  Diagnosis Date  . Allergy   . Anxiety   . Bell palsy 02/04/2016   Left  . Cancer ([place]) 2011   BCC  . Complication of anesthesia   . Hyperlipidemia   . Hypertension 2005  . Plantar fasciitis   . PONV (postoperative nausea and vomiting)    Past Surgical History:  Procedure Laterality  Date  . ABDOMINAL HYSTERECTOMY  1990  . basal cell carcinoma  2011   nose  . BREAST LUMPECTOMY WITH RADIOACTIVE SEED LOCALIZATION Left 12/05/2019   Procedure: LEFT BREAST  LUMPECTOMY WITH RADIOACTIVE SEED LOCALIZATION;  Surgeon: Alphonsa Overall, MD;  Location: [facility];  Service: General;  Laterality: Left;  . TENDON REPAIR     plantar fasciitis  . TONSILLECTOMY       ALLERGIES:  No Known Allergies   CURRENT MEDICATIONS:  Outpatient Encounter Medications as of 05/18/2020  Medication Sig  . allopurinol (ZYLOPRIM) 300 MG tablet Take 1 tablet Daily to Prevent Gout  . ALPRAZolam (XANAX) 0.5 MG tablet Take 1/2 to 1 tablet 2 to 3 x /day as needed for Anxiety  . Ascorbic Acid (VITAMIN C) 1000 MG tablet Take 1,000 mg by mouth daily.  Marland Kitchen aspirin EC 81 MG tablet Take 1 tablet (81 mg total) by mouth daily.  . calcium carbonate (OSCAL) 1500 (600 Ca) MG TABS tablet Take by mouth daily with breakfast.  . Cholecalciferol (VITAMIN D PO) Take 10,000 Units by mouth daily.  . Cyanocobalamin (VITAMIN B-12 PO) Take 1 tablet by mouth daily.  . fenofibrate (TRICOR) 145 MG tablet Take 1 tablet Daily For Triglycerides (Blood Fats)  . fluticasone (FLONASE) 50 MCG/ACT nasal spray Use 1 to 2 sprays each Nares 1 to 2 x /day  . loratadine (CLARITIN) 10 MG tablet Take 1 tablet (10 mg total) by mouth daily.  . phentermine (ADIPEX-P) 37.5 MG tablet Take 1 tablet every morning for Dieting & Weight Loss  . pravastatin (PRAVACHOL) 20 MG tablet Take  1 tablet at Bedtime for Cholesterol  . pyridOXINE (VITAMIN B-6) 100 MG tablet Take 100 mg by mouth daily.  . tamoxifen (NOLVADEX) 10 MG tablet Take 0.5 tablets (5 mg total) by mouth daily. (Patient not taking: Reported on 03/02/2020)  . topiramate (TOPAMAX) 50 MG tablet Take 2 to 3 tablets Daily for Dieting & Weight Loss  . triamterene-hydrochlorothiazide (MAXZIDE-25) 37.5-25 MG tablet Take 1/2 to 1 tablet Daily for BP & Fluid Retention  . zinc gluconate 50 MG tablet Take 50 mg by mouth daily.   No facility-administered encounter medications on file as of 05/18/2020.     ONCOLOGIC FAMILY HISTORY:  Family History  Problem  Relation Age of Onset  . Hypertension Mother   . Heart attack Mother   . Hyperlipidemia Mother   . Heart disease Mother 49       fatal MI  . Stroke Mother   . Arthritis Mother        rheumatoid  . Hypertension Brother   . Cancer Brother        throat, leukemia  . Parkinson's disease Brother   . Cancer Father        lung     GENETIC COUNSELING/TESTING: Not at this time  SOCIAL HISTORY:  Social History   Socioeconomic History  . Marital status: Married    Spouse name: Not on file  . Number of children: Not on file  . Years of education: Not on file  . Highest education level: Not on file  Occupational History  . Not on file  Tobacco Use  . Smoking status: Never Smoker  . Smokeless tobacco: Never Used  Vaping Use  . Vaping Use: Never used  Substance and Sexual Activity  . Alcohol use: Yes  . Drug use: Never  . Sexual activity: Not on file  Other Topics Concern  . Not on file  Social History Narrative  . Not on file   Social Determinants of Health   Financial Resource Strain:   . Difficulty of Paying Living Expenses:   Food Insecurity:   . Worried About Charity fundraiser in the Last Year:   . Arboriculturist in the Last Year:   Transportation Needs:   . Film/video editor (Medical):   Marland Kitchen Lack of Transportation (Non-Medical):   Physical Activity:   . Days of Exercise per Week:   . Minutes of Exercise per Session:   Stress:   . Feeling of Stress :   Social Connections:   . Frequency of Communication with Friends and Family:   . Frequency of Social Gatherings with Friends and Family:   . Attends Religious Services:   . Active Member of Clubs or Organizations:   . Attends Archivist Meetings:   Marland Kitchen Marital Status:   Intimate Partner Violence:   . Fear of Current or Ex-Partner:   . Emotionally Abused:   Marland Kitchen Physically Abused:   . Sexually Abused:      OBSERVATIONS/OBJECTIVE:  Patient appears well  She is in no apparent distress.  Mood and  behavior are normal.  Speech is normal, skin visualized without rash or lesion, breathing is non labored.  LABORATORY DATA:  None for this visit.  DIAGNOSTIC IMAGING:  None for this visit.      ASSESSMENT AND PLAN:  Ms.. Finder is a pleasant 69 y.o. female with Stage 0 left breast DCIS, ER+/PR+, diagnosed in 10/2019, treated with lumpectomy, adjuvant radiation therapy, unable to tolerate the Tamoxifen.  She presents to the Survivorship Clinic for our initial meeting and routine follow-up post-completion of treatment for breast cancer.    1. Stage 0 left breast cancer:  [name] is continuing to recover from definitive treatment for breast cancer. She will follow-up with her medical oncologist, Dr. Lindi Adie in 12/2020 with history and physical exam per surveillance protocol.   Her mammogram is due 10/2020; orders placed today.  Today, a comprehensive survivorship care plan and treatment summary was reviewed with the patient today detailing her breast cancer diagnosis, treatment course, potential late/long-term effects of treatment, appropriate follow-up care with recommendations for the future, and patient education resources.  A copy of this summary, along with a letter will be sent to the patient's primary care provider via mail/fax/In Basket message after today's visit.    2. Bone health:  Given Ms. Plasse's age/history of breast cancer, she is at risk for bone demineralization.  She has not recently undergone bone density testing, and I offered to place orders for this when she has her mammogram.  She declined.    3. Cancer screening:  Due to Ms. Peter's history and her age, she should receive screening for skin cancers, colon cancer.  The information and recommendations are listed on the patient's comprehensive care plan/treatment summary and were reviewed in detail with the patient.    4. Health maintenance and wellness promotion: Ms. Holten was encouraged to consume 5-7 servings of fruits and  vegetables per day. We reviewed the "Nutrition Rainbow" handout, as well as the handout "Take Control of Your Health and Reduce Your Cancer Risk" from the [facility].  She was also encouraged to engage in moderate to vigorous exercise for 30 minutes per day most days of the week. We discussed the [employer] fitness program, which is designed for cancer survivors to help them become more physically fit after cancer treatments.  She was instructed to limit her alcohol consumption and continue to abstain from tobacco use.     5. Support services/counseling: It is not uncommon for this period of the patient's cancer care trajectory to be one of many emotions and stressors.  We discussed how this can be increasingly difficult during the times of quarantine and social distancing due to the COVID-19 pandemic.   She was given information regarding our available services and encouraged to contact me with any questions or for help enrolling in any of our support group/programs.    Follow up instructions:    -Return to cancer center in 12/2020 for f/u with Dr. Lindi Adie  -Mammogram due in 10/2020 -Follow up with Dr. Lucia Gaskins in 06/2020 -She is welcome to return back to the Survivorship Clinic at any time; no additional follow-up needed at this time.  -Consider referral back to survivorship as a long-term survivor for continued surveillance  The patient was provided an opportunity to ask questions and all were answered. The patient agreed with the plan and demonstrated an understanding of the instructions.   Total encounter time: 30 minutes* via video.  Wilber Bihari, NP 05/18/20 5:57 AM Medical Oncology and Hematology [facility] [place], [place] [address] Tel. [phone]    Fax. [phone]  *Total Encounter Time as defined by the Centers for Medicare and Medicaid Services includes, in addition to the face-to-face time of a patient visit (documented in the  note above) non-face-to-face time: obtaining and reviewing outside history, ordering and reviewing medications, tests or procedures, care coordination (communications with other health care professionals or caregivers) and documentation in the medical record.

## 2020-05-19 ENCOUNTER — Telehealth: Payer: Self-pay | Admitting: Adult Health

## 2020-05-19 NOTE — Telephone Encounter (Signed)
Scheduled appts per 6/22 LOS. Pt confirmed appt date and time.

## 2020-05-24 ENCOUNTER — Encounter: Payer: Self-pay | Admitting: Adult Health

## 2020-05-24 NOTE — Progress Notes (Signed)
MEDICARE ANNUAL WELLNESS VISIT AND FOLLOW UP  Assessment:    Diagnoses and all orders for this visit:  Encounter for Medicare annual wellness exam Due 1 year  Hypertension, benign essential, goal below 140/90 Continue medication Monitor blood pressure at home; call if consistently over 130/80 Continue DASH diet.   Reminder to go to the ER if any CP, SOB, nausea, dizziness, severe HA, changes vision/speech, left arm numbness and tingling and jaw pain.  Fatty liver (2005 by [place]) Maintain healthy weight, avoid alcohol/tylenol, will monitor LFTs  Gout of big toe Continue allopurinol Diet discussed Check uric acid as needed  Prediabetes Discussed disease and risks Discussed diet/exercise, weight management  A1C q33m, due  Mixed hyperlipidemia Continue medications Continue low cholesterol diet and exercise.  Check lipid panel.   Bmi -23 Long discussion about weight loss, diet, and exercise Excellent progress with weight loss with phentermine/topamax, at goal weight, will taper off topamax and phentermine  Anxiety Rare use of xanax  Stress management techniques discussed, increase water, good sleep hygiene discussed, increase exercise, and increase veggies.   Environmental allergies Continue OTC allergy pills  History of Bell's palsy Resolved, followed by Dr. Jannifer Franklin  DCIS of L breast Follows with oncology, q72m follow up S/p lumpectomy, completed radiation, intolerant of tamoxifen   Over 30 minutes of exam, counseling, chart review, and critical decision making was performed  Future Appointments  Date Time Provider [place]  05/28/2020 10:45 AM Edrick Kins, DPM [facility]  07/06/2020 11:30 AM Ralene Bathe, MD [facility] None  08/09/2020  9:30 AM Noreene Filbert, MD [facility] None  12/15/2020  9:00 AM Unk Pinto, MD [facility] None  01/18/2021  9:15 AM Nicholas Lose, MD [facility] None    Plan:   During the course of the visit the  patient was educated and counseled about appropriate screening and preventive services including:    Pneumococcal vaccine   Influenza vaccine  Td vaccine  Prevnar 13  Screening electrocardiogram  Screening mammography  Bone densitometry screening  Colorectal cancer screening  Diabetes screening  Glaucoma screening  Nutrition counseling   Advanced directives: given info/requested copies   Subjective:   [name] is a 69 y.o. female who presents for annual medicare visit and follow on on chronic medical conditions.    She is followed by ortho Dr. Amalia Hailey for her feet, seeing Lamar Sprinkles, PA at [facility] clinic for injections.   She has sinus and allergy issues, getting injections by allergist in [place], taking claritin  Last year she was diagnosed with DCIS Lt breast on 11/13/2019. Underwent L breast Lumpectomy on Jan 8 by Dr Lucia Gaskins. Was on tamoxifen by Dr. Lindi Adie but couldn't tolerate and was d/c'd, close monitoring q51m alternating with surgeon, and completed adjuvent radiation treatments by Dr. Baruch Gouty. Next follow up planned in Dec 2021.    She has hx of anxiety, currently prescribed xanax, takes occasionally.   she is prescribed phentermine/topamax for weight loss, 1/2 tab daily. While on the medication they have lost 5 lbs since last visit. They deny palpitations, anxiety, trouble sleeping, elevated BP.   BMI is Body mass index is 23.18 kg/m., she is working on diet and exercise. Wt Readings from Last 3 Encounters:  05/26/20 148 lb (67.1 kg)  03/02/20 (P) 153 lb 1.6 oz (69.4 kg)  02/05/20 154 lb 8 oz (70.1 kg)   Her blood pressure has been controlled at home, today their BP is BP: 120/72 She does workout. She denies chest pain, shortness  of breath, dizziness.   She is on cholesterol medication (pravastatin 20 mg three days a week, fenofibrate) and denies myalgias. Her cholesterol is not at goal. The cholesterol last visit was:   Lab Results  Component  Value Date   CHOL 148 11/27/2019   HDL 51 11/27/2019   LDLCALC 78 11/27/2019   TRIG 109 11/27/2019   CHOLHDL 2.9 11/27/2019   She has been working on diet and exercise for prediabetes, and denies foot ulcerations, hyperglycemia, hypoglycemia , increased appetite, nausea, paresthesia of the feet, polydipsia, polyuria, visual disturbances, vomiting and weight loss. Last A1C in the office was:  Lab Results  Component Value Date   HGBA1C 5.8 (H) 11/27/2019     Last GFR Lab Results  Component Value Date   GFRNONAA >60 11/19/2019   Patient is on Vitamin D supplement. Lab Results  Component Value Date   VD25OH 79 11/27/2019     Patient is on allopurinol for gout and does not report a recent flare.  Lab Results  Component Value Date   LABURIC 4.7 11/27/2019   LFTs monitored closely due to hx of fatty liver per [place] in 2005, improved with recent weight loss.  Lab Results  Component Value Date   ALT 14 11/19/2019   AST 19 11/19/2019   ALKPHOS 76 11/19/2019   BILITOT 0.4 11/19/2019     Medication Review Current Outpatient Medications on File Prior to Visit  Medication Sig Dispense Refill  . allopurinol (ZYLOPRIM) 300 MG tablet Take 1 tablet Daily to Prevent Gout 90 tablet 0  . ALPRAZolam (XANAX) 0.5 MG tablet Take 1/2 to 1 tablet 2 to 3 x /day as needed for Anxiety 90 tablet 0  . Ascorbic Acid (VITAMIN C) 1000 MG tablet Take 1,000 mg by mouth daily.    Marland Kitchen aspirin EC 81 MG tablet Take 1 tablet (81 mg total) by mouth daily.    . Biotin 2500 MCG CAPS Take by mouth daily.    . calcium carbonate (OSCAL) 1500 (600 Ca) MG TABS tablet Take by mouth daily with breakfast.    . Cholecalciferol (VITAMIN D PO) Take 10,000 Units by mouth daily.    . Cyanocobalamin (VITAMIN B-12 PO) Take 1 tablet by mouth daily.    . fenofibrate (TRICOR) 145 MG tablet Take 1 tablet Daily For Triglycerides (Blood Fats) 90 tablet 3  . fluticasone (FLONASE) 50 MCG/ACT nasal spray Use 1 to 2 sprays each Nares 1 to 2  x /day 48 g 3  . loratadine (CLARITIN) 10 MG tablet Take 1 tablet (10 mg total) by mouth daily. 90 tablet 1  . phentermine (ADIPEX-P) 37.5 MG tablet Take 1 tablet every morning for Dieting & Weight Loss 90 tablet 1  . pravastatin (PRAVACHOL) 20 MG tablet Take  1 tablet at Bedtime for Cholesterol 90 tablet 2  . pyridOXINE (VITAMIN B-6) 100 MG tablet Take 100 mg by mouth daily.    Marland Kitchen topiramate (TOPAMAX) 50 MG tablet Take 2 to 3 tablets Daily for Dieting & Weight Loss 270 tablet 1  . triamterene-hydrochlorothiazide (MAXZIDE-25) 37.5-25 MG tablet Take 1/2 to 1 tablet Daily for BP & Fluid Retention 90 tablet 3  . zinc gluconate 50 MG tablet Take 50 mg by mouth daily.     No current facility-administered medications on file prior to visit.    Allergies: No Known Allergies  Current Problems (verified) has Hypertension, benign essential, goal below 140/90; Hyperlipidemia; Environmental allergies; Anxiety; History of Bell's palsy; Prediabetes; Overweight (BMI 25.0-29.9); Gout of  big toe; Fatty liver (2005 by U/S); Family history of cardiac disorder in mother; and Ductal carcinoma in situ (DCIS) of left breast on their problem list.  Screening Tests Immunization History  Administered Date(s) Administered  . Influenza,inj,quad, With Preservative 08/27/2017  . Influenza-Unspecified 10/02/2018, 10/27/2019  . PPD Test 05/14/2014  . Pneumococcal Polysaccharide-23 02/08/2017    Preventative care: Last colonoscopy: Mar 27 2016, Dr. Watt Climes Last mammogram:  1/21 Last pap smear/pelvic exam: Hysterctomy, 2014 DEXA: - discussed with oncologist to schedule with mammogram, declines today   Prior vaccinations: TD or Tdap: 2011 declines  Influenza: Declined, reaction  Pneumococcal: Done Prevnar13: declines today  Shingles/Zostavax: 2014 Covid 19: 2/2, 2021, pfizer   Names of Other Physician/Practitioners you currently use: 1. [facility]- here for primary care 2.  [facility] in [employer] [employer], eye doctor, last visit 2021 3. Dr. Illene Bolus, dentist, last visit 09/2018, will schedule   Patient Care Team: Unk Pinto, MD as PCP - General (Internal Medicine) Avon Gully, NP as Nurse Practitioner (Obstetrics and Gynecology) Clarene Essex, MD as Consulting Physician (Gastroenterology) Suella Broad, MD as Consulting Physician (Physical Medicine and Rehabilitation) Jari Pigg, MD as Consulting Physician (Dermatology) Nicholas Lose, MD as Consulting Physician (Hematology and Oncology) Alphonsa Overall, MD as Consulting Physician (General Surgery) Noreene Filbert, MD as Referring Physician (Radiation Oncology)  Surgical: She  has a past surgical history that includes Abdominal hysterectomy (1990); Tonsillectomy; Tendon repair; basal cell carcinoma (2011); and Breast lumpectomy with radioactive seed localization (Left, 12/05/2019). Family Her family history includes Arthritis in her mother; Cancer in her brother and father; Heart attack in her mother; Heart disease (age of onset: 90) in her mother; Hyperlipidemia in her mother; Hypertension in her brother and mother; Parkinson's disease in her brother; Stroke in her mother. Social history  She reports that she has never smoked. She has never used smokeless tobacco. She reports current alcohol use. She reports that she does not use drugs.  MEDICARE WELLNESS OBJECTIVES: Physical activity: Current Exercise Habits: Home exercise routine, Type of exercise: walking, Time (Minutes): 35, Frequency (Times/Week): 3, Weekly Exercise (Minutes/Week): 105, Intensity: Mild, Exercise limited by: None identified Cardiac risk factors: Cardiac Risk Factors include: advanced age (>60men, >86 women);hypertension;dyslipidemia Depression/mood screen:   Depression screen [facility] 2/9 05/26/2020  Decreased Interest 0  Down, Depressed, Hopeless 0  PHQ - 2 Score 0    ADLs:  In your present state of health, do you have any difficulty  performing the following activities: 05/26/2020 12/05/2019  Hearing? N N  Vision? N N  Difficulty concentrating or making decisions? N N  Walking or climbing stairs? N N  Dressing or bathing? N N  Doing errands, shopping? N -  Some recent data might be hidden     Cognitive Testing  Alert? Yes  Normal Appearance?Yes  Oriented to person? Yes  Place? Yes   Time? Yes  Recall of three objects?  Yes  Can perform simple calculations? Yes  Displays appropriate judgment?Yes  Can read the correct time from a watch face?Yes  EOL planning: Does Patient Have a Medical Advance Directive?: Yes Type of Advance Directive: Healthcare Power of Attorney, Living will Does patient want to make changes to medical advance directive?: No - Patient declined Copy of [place] in Chart?: No - copy requested   Objective:   Today's Vitals   05/26/20 1000  BP: 120/72  Pulse: 82  Temp: (!) 96.4 F (35.8 C)  SpO2: 98%  Weight: 148 lb (  67.1 kg)   Wt Readings from Last 3 Encounters:  05/26/20 148 lb (67.1 kg)  03/02/20 (P) 153 lb 1.6 oz (69.4 kg)  02/05/20 154 lb 8 oz (70.1 kg)    Body mass index is 23.18 kg/m.  General appearance: alert, no distress, WD/WN,  female HEENT: normocephalic, sclerae anicteric, TMs pearly, nares patent, no discharge or erythema, pharynx normal Oral cavity: MMM, no lesions Neck: supple, no lymphadenopathy, no thyromegaly, no masses Heart: RRR, normal S1, S2, no murmurs Lungs: CTA bilaterally, no wheezes, rhonchi, or rales Abdomen: +bs, soft, non tender, non distended, no masses, no hepatomegaly, no splenomegaly Musculoskeletal: nontender, no swelling, no obvious deformity Extremities: no edema, no cyanosis, no clubbing Pulses: 2+ symmetric, upper and lower extremities, normal cap refill Neurological: alert, oriented x 3, CN2-12 intact, strength normal upper extremities and lower extremities, sensation normal throughout, DTRs 2+ throughout, no cerebellar  signs, gait normal Psychiatric: normal affect, behavior normal, pleasant  Breast: defer Gyn: defer Rectal: defer   Medicare Attestation I have personally reviewed: The patient's medical and social history Their use of alcohol, tobacco or illicit drugs Their current medications and supplements The patient's functional ability including ADLs,fall risks, home safety risks, cognitive, and hearing and visual impairment Diet and physical activities Evidence for depression or mood disorders  The patient's weight, height, BMI, and visual acuity have been recorded in the chart.  I have made referrals, counseling, and provided education to the patient based on review of the above and I have provided the patient with a written personalized care plan for preventive services.     Izora Ribas, NP   05/26/2020

## 2020-05-26 ENCOUNTER — Ambulatory Visit (INDEPENDENT_AMBULATORY_CARE_PROVIDER_SITE_OTHER): Payer: Medicare Other | Admitting: Adult Health

## 2020-05-26 ENCOUNTER — Other Ambulatory Visit: Payer: Self-pay

## 2020-05-26 ENCOUNTER — Encounter: Payer: Self-pay | Admitting: Adult Health

## 2020-05-26 VITALS — BP 120/72 | HR 82 | Temp 96.4°F | Wt 148.0 lb

## 2020-05-26 DIAGNOSIS — R7309 Other abnormal glucose: Secondary | ICD-10-CM

## 2020-05-26 DIAGNOSIS — Z9109 Other allergy status, other than to drugs and biological substances: Secondary | ICD-10-CM

## 2020-05-26 DIAGNOSIS — K760 Fatty (change of) liver, not elsewhere classified: Secondary | ICD-10-CM

## 2020-05-26 DIAGNOSIS — Z0000 Encounter for general adult medical examination without abnormal findings: Secondary | ICD-10-CM

## 2020-05-26 DIAGNOSIS — E663 Overweight: Secondary | ICD-10-CM

## 2020-05-26 DIAGNOSIS — Z0001 Encounter for general adult medical examination with abnormal findings: Secondary | ICD-10-CM

## 2020-05-26 DIAGNOSIS — D0512 Intraductal carcinoma in situ of left breast: Secondary | ICD-10-CM

## 2020-05-26 DIAGNOSIS — M109 Gout, unspecified: Secondary | ICD-10-CM

## 2020-05-26 DIAGNOSIS — I10 Essential (primary) hypertension: Secondary | ICD-10-CM

## 2020-05-26 DIAGNOSIS — F419 Anxiety disorder, unspecified: Secondary | ICD-10-CM

## 2020-05-26 DIAGNOSIS — R7303 Prediabetes: Secondary | ICD-10-CM

## 2020-05-26 DIAGNOSIS — R6889 Other general symptoms and signs: Secondary | ICD-10-CM | POA: Diagnosis not present

## 2020-05-26 DIAGNOSIS — Z8669 Personal history of other diseases of the nervous system and sense organs: Secondary | ICD-10-CM

## 2020-05-26 DIAGNOSIS — E782 Mixed hyperlipidemia: Secondary | ICD-10-CM

## 2020-05-26 NOTE — Patient Instructions (Addendum)
  [name] , Thank you for taking time to come for your Medicare Wellness Visit. I appreciate your ongoing commitment to your health goals. Please review the following plan we discussed and let me know if I can assist you in the future.   These are the goals we discussed: Goals    . Exercise 150 min/wk Moderate Activity       This is a list of the screening recommended for you and due dates:  Health Maintenance  Topic Date Due  . COVID-19 Vaccine (1) Never done  . DEXA scan (bone density measurement)  Never done  . Tetanus Vaccine  11/28/2019  . Mammogram  12/03/2020  . Colon Cancer Screening  03/27/2021  . Pneumonia vaccines  Completed    Try cutting topiramate to 1/2 tab for 1-2 weeks then stop  Try taking long weekend holiday off of phentermine to prevent tolerance, ideally slow taper off completely     Goal to maintain current weight -    Aim for 7+ servings of fruits and vegetables daily  65-80+ fluid ounces of water or unsweet tea for healthy kidneys  Limit to max 1 drink of alcohol per day; avoid smoking/tobacco  Limit animal fats in diet for cholesterol and heart health - choose grass fed whenever available  Avoid highly processed foods, and foods high in saturated/trans fats  Aim for low stress - take time to unwind and care for your mental health  Aim for 150 min of moderate intensity exercise weekly for heart health, and weights twice weekly for bone health  Aim for 7-9 hours of sleep daily

## 2020-05-27 LAB — LIPID PANEL
Cholesterol: 148 mg/dL (ref <200)
HDL: 52 mg/dL (ref >=50)
LDL Cholesterol (Calc): 77 mg/dL (calc)
Non-HDL Cholesterol (Calc): 96 mg/dL (calc) (ref <130)
Total CHOL/HDL Ratio: 2.8 (calc) (ref <5.0)
Triglycerides: 109 mg/dL (ref <150)

## 2020-05-27 LAB — CBC WITH DIFFERENTIAL/PLATELET
Absolute Monocytes: 349 cells/uL (ref 200–950)
Basophils Absolute: 38 cells/uL (ref 0–200)
Basophils Relative: 0.9 %
Eosinophils Absolute: 109 cells/uL (ref 15–500)
Eosinophils Relative: 2.6 %
HCT: 41 % (ref 35.0–45.0)
Hemoglobin: 14.5 g/dL (ref 11.7–15.5)
Lymphs Abs: 1512 cells/uL (ref 850–3900)
MCH: 31 pg (ref 27.0–33.0)
MCHC: 35.4 g/dL (ref 32.0–36.0)
MCV: 87.6 fL (ref 80.0–100.0)
MPV: 9.2 fL (ref 7.5–12.5)
Monocytes Relative: 8.3 %
Neutro Abs: 2192 cells/uL (ref 1500–7800)
Neutrophils Relative %: 52.2 %
Platelets: 213 10*3/uL (ref 140–400)
RBC: 4.68 10*6/uL (ref 3.80–5.10)
RDW: 12.5 % (ref 11.0–15.0)
Total Lymphocyte: 36 %
WBC: 4.2 10*3/uL (ref 3.8–10.8)

## 2020-05-27 LAB — COMPLETE METABOLIC PANEL WITH GFR
AG Ratio: 1.8 (calc) (ref 1.0–2.5)
ALT: 19 U/L (ref 6–29)
AST: 25 U/L (ref 10–35)
Albumin: 4.7 g/dL (ref 3.6–5.1)
Alkaline phosphatase (APISO): 54 U/L (ref 37–153)
BUN/Creatinine Ratio: 18 (calc) (ref 6–22)
BUN: 19 mg/dL (ref 7–25)
CO2: 31 mmol/L (ref 20–32)
Calcium: 10.4 mg/dL (ref 8.6–10.4)
Chloride: 101 mmol/L (ref 98–110)
Creat: 1.04 mg/dL — ABNORMAL HIGH (ref 0.50–0.99)
GFR, Est African American: 63 mL/min/{1.73_m2} (ref >=60)
GFR, Est Non African American: 55 mL/min/{1.73_m2} — ABNORMAL LOW (ref >=60)
Globulin: 2.6 g/dL (calc) (ref 1.9–3.7)
Glucose, Bld: 85 mg/dL (ref 65–99)
Potassium: 3.7 mmol/L (ref 3.5–5.3)
Sodium: 139 mmol/L (ref 135–146)
Total Bilirubin: 0.7 mg/dL (ref 0.2–1.2)
Total Protein: 7.3 g/dL (ref 6.1–8.1)

## 2020-05-27 LAB — MAGNESIUM: Magnesium: 2.2 mg/dL (ref 1.5–2.5)

## 2020-05-27 LAB — HEMOGLOBIN A1C
Hgb A1c MFr Bld: 5.4 % of total Hgb (ref <5.7)
Mean Plasma Glucose: 108 (calc)
eAG (mmol/L): 6 (calc)

## 2020-05-27 LAB — TSH: TSH: 0.93 mIU/L (ref 0.40–4.50)

## 2020-05-28 ENCOUNTER — Other Ambulatory Visit: Payer: Self-pay

## 2020-05-28 ENCOUNTER — Encounter: Payer: Self-pay | Admitting: Podiatry

## 2020-05-28 ENCOUNTER — Ambulatory Visit (INDEPENDENT_AMBULATORY_CARE_PROVIDER_SITE_OTHER): Payer: Medicare Other | Admitting: Podiatry

## 2020-05-28 DIAGNOSIS — M7751 Other enthesopathy of right foot: Secondary | ICD-10-CM | POA: Diagnosis not present

## 2020-05-28 DIAGNOSIS — L989 Disorder of the skin and subcutaneous tissue, unspecified: Secondary | ICD-10-CM

## 2020-05-28 NOTE — Progress Notes (Signed)
   Subjective: 69 y.o. female presenting to the office today for follow up evaluation of right foot pain. She reports continued pain and would like another injection since it helped alleviate her symptoms at the last visit. She also complains of a painful callus lesion noted to the right foot. Walking and bearing weight increases the pain. She has not done anything at home for treatment. Patient is here for further evaluation and treatment.   Past Medical History:  Diagnosis Date  . Allergy   . Anxiety   . Bell palsy 02/04/2016   Left  . Cancer ([place]) 2011   BCC  . Carpal tunnel syndrome on both sides 01/23/2017  . Complication of anesthesia   . Hyperlipidemia   . Hypertension 2005  . Plantar fasciitis   . PONV (postoperative nausea and vomiting)      Objective:  Physical Exam General: Alert and oriented x3 in no acute distress  Dermatology: Hyperkeratotic lesion(s) present on the plantar third MTPJ right foot. Pain on palpation with a central nucleated core noted. Skin is warm, dry and supple bilateral lower extremities. Negative for open lesions or macerations.  Vascular: Palpable pedal pulses bilaterally. No edema or erythema noted. Capillary refill within normal limits.  Neurological: Epicritic and protective threshold grossly intact bilaterally.   Musculoskeletal Exam: Pain on palpation at the keratotic lesion(s) noted. Pain with palpation noted to the 3rd MPJ of the right foot. Range of motion within normal limits bilateral. Muscle strength 5/5 in all groups bilateral.  Assessment: 1. 3rd MPJ capsulitis right  2. Pre-ulcerative callus lesion right   Plan of Care:  1. Patient evaluated 2. Excisional debridement of keratoic lesion(s) using a chisel blade was performed without incident.  3. Dressed area with light dressing. 4. Injection of 0.5 mLs Celestone Soluspan injected into the 3rd MPJ of the right foot.  5. Patient is to return to the clinic PRN.   Edrick Kins,  DPM [facility]  Dr. Edrick Kins, [address] [place]                                        [place], [place] [address]                Office [phone]  Fax [phone]

## 2020-05-30 ENCOUNTER — Other Ambulatory Visit: Payer: Self-pay | Admitting: Internal Medicine

## 2020-05-30 DIAGNOSIS — M109 Gout, unspecified: Secondary | ICD-10-CM

## 2020-05-30 MED ORDER — ALLOPURINOL 300 MG PO TABS: ORAL_TABLET | ORAL | 0 refills | Status: DC

## 2020-06-17 DIAGNOSIS — J301 Allergic rhinitis due to pollen: Secondary | ICD-10-CM | POA: Diagnosis not present

## 2020-06-24 DIAGNOSIS — J301 Allergic rhinitis due to pollen: Secondary | ICD-10-CM | POA: Diagnosis not present

## 2020-07-01 DIAGNOSIS — J301 Allergic rhinitis due to pollen: Secondary | ICD-10-CM | POA: Diagnosis not present

## 2020-07-02 DIAGNOSIS — J301 Allergic rhinitis due to pollen: Secondary | ICD-10-CM | POA: Diagnosis not present

## 2020-07-02 DIAGNOSIS — D0592 Unspecified type of carcinoma in situ of left breast: Secondary | ICD-10-CM | POA: Diagnosis not present

## 2020-07-06 ENCOUNTER — Other Ambulatory Visit: Payer: Self-pay

## 2020-07-06 ENCOUNTER — Ambulatory Visit (INDEPENDENT_AMBULATORY_CARE_PROVIDER_SITE_OTHER): Payer: Medicare Other | Admitting: Dermatology

## 2020-07-06 DIAGNOSIS — L650 Telogen effluvium: Secondary | ICD-10-CM | POA: Diagnosis not present

## 2020-07-06 DIAGNOSIS — L820 Inflamed seborrheic keratosis: Secondary | ICD-10-CM

## 2020-07-06 NOTE — Patient Instructions (Signed)
Cryotherapy Aftercare  . Wash gently with soap and water everyday.   . Apply Vaseline and Band-Aid daily until healed.  

## 2020-07-06 NOTE — Progress Notes (Signed)
   Follow-Up Visit   Subjective  [name] is a 69 y.o. female who presents for the following: ISK f/u (bil arms, 8wk f/u) and telogen Effluvium (scalp, Recent bloodwork TSH WNL, Biotin, HairMax laser 3x/wk).  The following portions of the chart were reviewed this encounter and updated as appropriate:  Tobacco  Allergies  Meds  Problems  Med Hx  Surg Hx  Fam Hx     Review of Systems:  No other skin or systemic complaints except as noted in HPI or Assessment and Plan.  Objective  Well appearing patient in no apparent distress; mood and affect are within normal limits.  A focused examination was performed including arms. Relevant physical exam findings are noted in the Assessment and Plan.  Objective  bil arms x 40 (40): Erythematous keratotic or waxy stuck-on papule or plaque.   Objective  Scalp: Scalp with new hair growth   Assessment & Plan  Inflamed seborrheic keratosis (40) bil arms x 40  Destruction of lesion - bil arms x 40 Complexity: simple   Destruction method: cryotherapy   Informed consent: discussed and consent obtained   Timeout:  patient name, date of birth, surgical site, and procedure verified Lesion destroyed using liquid nitrogen: Yes   Region frozen until ice ball extended beyond lesion: Yes   Outcome: patient tolerated procedure well with no complications   Post-procedure details: wound care instructions given    Telogen effluvium Scalp  Probably r/t hx of Covid Recent TSH WNL  Cont HairMax laser 3x/wk Cont Biotin qd Restart Rogaine qd/bid  Reassured and advised will take time (1 year)  Return in about 8 weeks (around 08/31/2020) for ISKs.   I, Othelia Pulling, RMA, am acting as scribe for Sarina Ser, MD .  Documentation: I have reviewed the above documentation for accuracy and completeness, and I agree with the above.  Sarina Ser, MD

## 2020-07-07 ENCOUNTER — Encounter: Payer: Self-pay | Admitting: Dermatology

## 2020-07-08 DIAGNOSIS — J301 Allergic rhinitis due to pollen: Secondary | ICD-10-CM | POA: Diagnosis not present

## 2020-07-13 DIAGNOSIS — Z1152 Encounter for screening for COVID-19: Secondary | ICD-10-CM | POA: Diagnosis not present

## 2020-07-13 DIAGNOSIS — Z03818 Encounter for observation for suspected exposure to other biological agents ruled out: Secondary | ICD-10-CM | POA: Diagnosis not present

## 2020-07-14 DIAGNOSIS — J028 Acute pharyngitis due to other specified organisms: Secondary | ICD-10-CM | POA: Diagnosis not present

## 2020-07-15 DIAGNOSIS — J301 Allergic rhinitis due to pollen: Secondary | ICD-10-CM | POA: Diagnosis not present

## 2020-07-22 DIAGNOSIS — J301 Allergic rhinitis due to pollen: Secondary | ICD-10-CM | POA: Diagnosis not present

## 2020-07-26 ENCOUNTER — Other Ambulatory Visit: Payer: Self-pay | Admitting: Internal Medicine

## 2020-07-29 DIAGNOSIS — J301 Allergic rhinitis due to pollen: Secondary | ICD-10-CM | POA: Diagnosis not present

## 2020-08-05 DIAGNOSIS — J301 Allergic rhinitis due to pollen: Secondary | ICD-10-CM | POA: Diagnosis not present

## 2020-08-09 ENCOUNTER — Other Ambulatory Visit: Payer: Self-pay

## 2020-08-09 ENCOUNTER — Other Ambulatory Visit: Payer: Self-pay | Admitting: Internal Medicine

## 2020-08-09 ENCOUNTER — Encounter: Payer: Self-pay | Admitting: Radiation Oncology

## 2020-08-09 ENCOUNTER — Ambulatory Visit
Admission: RE | Admit: 2020-08-09 | Discharge: 2020-08-09 | Disposition: A | Discharge status: Home / Self Care | Payer: Medicare Other | Source: Ambulatory Visit | Attending: Radiation Oncology | Admitting: Radiation Oncology

## 2020-08-09 VITALS — BP 142/70 | HR 76 | Temp 96.2°F | Wt 152.0 lb

## 2020-08-09 DIAGNOSIS — Z923 Personal history of irradiation: Secondary | ICD-10-CM | POA: Diagnosis not present

## 2020-08-09 DIAGNOSIS — D0512 Intraductal carcinoma in situ of left breast: Secondary | ICD-10-CM | POA: Diagnosis not present

## 2020-08-09 DIAGNOSIS — Z170 Estrogen receptor positive status [ER+]: Secondary | ICD-10-CM | POA: Diagnosis not present

## 2020-08-09 NOTE — Progress Notes (Signed)
Radiation Oncology Follow up Note  Name: [name]   Date:   08/09/2020 MRN:  [id number] DOB: [birth year]    This 69 y.o. female presents to the clinic today for 88-month follow-up status post whole breast radiation to her left breast for ER/PR positive ductal carcinoma in situ.  REFERRING PROVIDER: Unk Pinto, MD  HPI: Patient is a 69 year old female now out 6 months having completed whole breast radiation to her left breast for ER/PR positive ductal carcinoma in situ seen today in routine follow-up she is doing well she specifically denies breast tenderness cough or bone pain.  She has had some shrinkage of her left breast..  She is currently not on antiestrogen therapy secondary to side effects.  She is not yet had a follow-up mammogram I have asked her to schedule that.  COMPLICATIONS OF TREATMENT: None  FOLLOW UP COMPLIANCE: Excellent  PHYSICAL EXAM:  BP (!) 142/70 (BP Location: Right Arm, Patient Position: Sitting, Cuff Size: Normal)   Pulse 76   Temp (!) 96.2 F (35.7 C) (Tympanic)   Wt 152 lb (68.9 kg)   BMI 23.81 kg/m  Lungs are clear to A&P cardiac examination essentially unremarkable with regular rate and rhythm. No dominant mass or nodularity is noted in either breast in 2 positions examined. Incision is well-healed. No axillary or supraclavicular adenopathy is appreciated. Cosmetic result is excellent.  Well-developed well-nourished patient in NAD. HEENT reveals PERLA, EOMI, discs not visualized.  Oral cavity is clear. No oral mucosal lesions are identified. Neck is clear without evidence of cervical or supraclavicular adenopathy. Lungs are clear to A&P. Cardiac examination is essentially unremarkable with regular rate and rhythm without murmur rub or thrill. Abdomen is benign with no organomegaly or masses noted. Motor sensory and DTR levels are equal and symmetric in the upper and lower extremities. Cranial nerves II through XII are grossly intact. Proprioception is  intact. No peripheral adenopathy or edema is identified. No motor or sensory levels are noted. Crude visual fields are within normal range.  RADIOLOGY RESULTS: I have requested the patient schedule follow-up mammograms  PLAN: Present time patient is doing well with no evidence of disease 6 months out from whole breast radiation and pleased with her overall progress.  I have asked her to schedule follow-up mammograms.  I have asked to see her back in 6 months for follow-up and then will start once year follow-up appointments.  She is currently not on antiestrogen therapy based on the significant side effect profile for tamoxifen.  Patient is to call with any concerns.  I would like to take this opportunity to thank you for allowing me to participate in the care of your patient.Noreene Filbert, MD

## 2020-08-11 DIAGNOSIS — H43811 Vitreous degeneration, right eye: Secondary | ICD-10-CM | POA: Diagnosis not present

## 2020-08-12 DIAGNOSIS — J301 Allergic rhinitis due to pollen: Secondary | ICD-10-CM | POA: Diagnosis not present

## 2020-08-17 ENCOUNTER — Other Ambulatory Visit: Payer: Self-pay | Admitting: Internal Medicine

## 2020-08-17 DIAGNOSIS — Z79899 Other long term (current) drug therapy: Secondary | ICD-10-CM

## 2020-08-17 MED ORDER — PHENTERMINE HCL 37.5 MG PO TABS: ORAL_TABLET | ORAL | 1 refills | Status: DC

## 2020-08-19 DIAGNOSIS — J301 Allergic rhinitis due to pollen: Secondary | ICD-10-CM | POA: Diagnosis not present

## 2020-08-26 DIAGNOSIS — J301 Allergic rhinitis due to pollen: Secondary | ICD-10-CM | POA: Diagnosis not present

## 2020-08-27 ENCOUNTER — Other Ambulatory Visit: Payer: Self-pay | Admitting: Internal Medicine

## 2020-08-27 DIAGNOSIS — M109 Gout, unspecified: Secondary | ICD-10-CM

## 2020-09-01 ENCOUNTER — Ambulatory Visit: Payer: Medicare Other | Admitting: Dermatology

## 2020-09-02 DIAGNOSIS — J301 Allergic rhinitis due to pollen: Secondary | ICD-10-CM | POA: Diagnosis not present

## 2020-09-03 DIAGNOSIS — H43811 Vitreous degeneration, right eye: Secondary | ICD-10-CM | POA: Diagnosis not present

## 2020-09-05 DIAGNOSIS — Z23 Encounter for immunization: Secondary | ICD-10-CM | POA: Diagnosis not present

## 2020-09-09 DIAGNOSIS — J301 Allergic rhinitis due to pollen: Secondary | ICD-10-CM | POA: Diagnosis not present

## 2020-09-13 ENCOUNTER — Other Ambulatory Visit: Payer: Self-pay

## 2020-09-13 ENCOUNTER — Ambulatory Visit (INDEPENDENT_AMBULATORY_CARE_PROVIDER_SITE_OTHER): Payer: Medicare Other | Admitting: Dermatology

## 2020-09-13 ENCOUNTER — Encounter: Payer: Self-pay | Admitting: Dermatology

## 2020-09-13 DIAGNOSIS — L821 Other seborrheic keratosis: Secondary | ICD-10-CM

## 2020-09-13 DIAGNOSIS — L820 Inflamed seborrheic keratosis: Secondary | ICD-10-CM

## 2020-09-13 DIAGNOSIS — L578 Other skin changes due to chronic exposure to nonionizing radiation: Secondary | ICD-10-CM | POA: Diagnosis not present

## 2020-09-13 DIAGNOSIS — L650 Telogen effluvium: Secondary | ICD-10-CM | POA: Diagnosis not present

## 2020-09-13 NOTE — Patient Instructions (Addendum)

## 2020-09-13 NOTE — Progress Notes (Signed)
   Follow-Up Visit   Subjective  [name] is a 69 y.o. female who presents for the following: Follow-up (OV 07/06/20 for ISK x 40 on B/L arms, and Telogen effluvium.). Patient presents today for follow up on OV 07/06/20 for ISK x 40 B/L arms tx'd with cryotherapy, Telogen effluvium tx'd with Hair max laser treatment and Biotin, not using Rogaine very much  The following portions of the chart were reviewed this encounter and updated as appropriate:  Tobacco  Allergies  Meds  Problems  Med Hx  Surg Hx  Fam Hx     Review of Systems:  No other skin or systemic complaints except as noted in HPI or Assessment and Plan.  Objective  Well appearing patient in no apparent distress; mood and affect are within normal limits.  A focused examination was performed including B/L arms, and scalp. Relevant physical exam findings are noted in the Assessment and Plan.  Objective  Scalp: New hair growth at temporal areas.  Objective  B/L Arms x 50 (50): Erythematous keratotic or waxy stuck-on papule or plaque.    Assessment & Plan  Telogen effluvium Scalp Improving and pt pleased. Continue current regimen for continue hair growth.  Inflamed seborrheic keratosis (50) B/L Arms x 50  Cryotherapy today Prior to procedure, discussed risks of blister formation, small wound, skin dyspigmentation, or rare scar following cryotherapy.   Destruction of lesion - B/L Arms x 50 Complexity: simple   Destruction method: cryotherapy   Informed consent: discussed and consent obtained   Timeout:  patient name, date of birth, surgical site, and procedure verified Lesion destroyed using liquid nitrogen: Yes   Region frozen until ice ball extended beyond lesion: Yes   Outcome: patient tolerated procedure well with no complications   Post-procedure details: wound care instructions given    Actinic Damage - diffuse scaly erythematous macules with underlying dyspigmentation - Recommend daily broad  spectrum sunscreen SPF 30+ to sun-exposed areas, reapply every 2 hours as needed.  - Call for new or changing lesions.  Seborrheic Keratoses - Stuck-on, waxy, tan-brown papules and plaques  - Discussed benign etiology and prognosis. - Observe - Call for any changes  Return in about 8 weeks (around 11/08/2020) for ISK.   I, Donzetta Kohut, CMA, am acting as scribe for Sarina Ser, MD . Documentation: I have reviewed the above documentation for accuracy and completeness, and I agree with the above.  Sarina Ser, MD

## 2020-09-16 DIAGNOSIS — J301 Allergic rhinitis due to pollen: Secondary | ICD-10-CM | POA: Diagnosis not present

## 2020-09-23 DIAGNOSIS — J301 Allergic rhinitis due to pollen: Secondary | ICD-10-CM | POA: Diagnosis not present

## 2020-09-28 DIAGNOSIS — J301 Allergic rhinitis due to pollen: Secondary | ICD-10-CM | POA: Diagnosis not present

## 2020-09-30 DIAGNOSIS — J301 Allergic rhinitis due to pollen: Secondary | ICD-10-CM | POA: Diagnosis not present

## 2020-10-07 DIAGNOSIS — J301 Allergic rhinitis due to pollen: Secondary | ICD-10-CM | POA: Diagnosis not present

## 2020-10-14 DIAGNOSIS — J301 Allergic rhinitis due to pollen: Secondary | ICD-10-CM | POA: Diagnosis not present

## 2020-10-28 DIAGNOSIS — J301 Allergic rhinitis due to pollen: Secondary | ICD-10-CM | POA: Diagnosis not present

## 2020-10-29 ENCOUNTER — Other Ambulatory Visit: Payer: Self-pay | Admitting: Internal Medicine

## 2020-10-29 MED ORDER — AZITHROMYCIN 250 MG PO TABS: ORAL_TABLET | ORAL | 0 refills | Status: DC

## 2020-11-04 DIAGNOSIS — J301 Allergic rhinitis due to pollen: Secondary | ICD-10-CM | POA: Diagnosis not present

## 2020-11-08 ENCOUNTER — Other Ambulatory Visit: Payer: Self-pay

## 2020-11-08 ENCOUNTER — Ambulatory Visit (INDEPENDENT_AMBULATORY_CARE_PROVIDER_SITE_OTHER): Payer: Medicare Other | Admitting: Dermatology

## 2020-11-08 DIAGNOSIS — L820 Inflamed seborrheic keratosis: Secondary | ICD-10-CM

## 2020-11-08 DIAGNOSIS — L578 Other skin changes due to chronic exposure to nonionizing radiation: Secondary | ICD-10-CM

## 2020-11-08 DIAGNOSIS — L821 Other seborrheic keratosis: Secondary | ICD-10-CM | POA: Diagnosis not present

## 2020-11-08 NOTE — Patient Instructions (Signed)
Cryotherapy Aftercare  . Wash gently with soap and water everyday.   . Apply Vaseline and Band-Aid daily until healed.  

## 2020-11-08 NOTE — Progress Notes (Signed)
   Follow-Up Visit   Subjective  [name] is a 69 y.o. female who presents for the following: Follow-up (2 months f/u irritated itchy spots on the arms need to be removed today ).  The following portions of the chart were reviewed this encounter and updated as appropriate:   Tobacco  Allergies  Meds  Problems  Med Hx  Surg Hx  Fam Hx     Review of Systems:  No other skin or systemic complaints except as noted in HPI or Assessment and Plan.  Objective  Well appearing patient in no apparent distress; mood and affect are within normal limits.  A focused examination was performed including face,arms . Relevant physical exam findings are noted in the Assessment and Plan.  Objective  bilateral arms x 30 (30): Erythematous keratotic or waxy stuck-on papule or plaque.    Assessment & Plan    Inflamed seborrheic keratosis (30) bilateral arms x 30  Destruction of lesion - bilateral arms x 30 Complexity: simple   Destruction method: cryotherapy   Informed consent: discussed and consent obtained   Timeout:  patient name, date of birth, surgical site, and procedure verified Lesion destroyed using liquid nitrogen: Yes   Region frozen until ice ball extended beyond lesion: Yes   Outcome: patient tolerated procedure well with no complications   Post-procedure details: wound care instructions given     Seborrheic Keratoses - Stuck-on, waxy, tan-brown papules and plaques  - Discussed benign etiology and prognosis. - Observe - Call for any changes  Actinic Damage - chronic, secondary to cumulative UV radiation exposure/sun exposure over time - diffuse scaly erythematous macules with underlying dyspigmentation - Recommend daily broad spectrum sunscreen SPF 30+ to sun-exposed areas, reapply every 2 hours as needed.  - Call for new or changing lesions.   Return in about 12 weeks (around 01/31/2021).  IMarye Round, CMA, am acting as scribe for Sarina Ser, MD  .  Documentation: I have reviewed the above documentation for accuracy and completeness, and I agree with the above.  Sarina Ser, MD

## 2020-11-11 ENCOUNTER — Encounter: Payer: Self-pay | Admitting: Dermatology

## 2020-11-11 DIAGNOSIS — J301 Allergic rhinitis due to pollen: Secondary | ICD-10-CM | POA: Diagnosis not present

## 2020-11-16 ENCOUNTER — Other Ambulatory Visit: Payer: Self-pay | Admitting: Internal Medicine

## 2020-11-16 DIAGNOSIS — Z853 Personal history of malignant neoplasm of breast: Secondary | ICD-10-CM | POA: Diagnosis not present

## 2020-11-16 DIAGNOSIS — E782 Mixed hyperlipidemia: Secondary | ICD-10-CM

## 2020-11-16 LAB — HM MAMMOGRAPHY

## 2020-11-18 DIAGNOSIS — J301 Allergic rhinitis due to pollen: Secondary | ICD-10-CM | POA: Diagnosis not present

## 2020-11-25 DIAGNOSIS — J301 Allergic rhinitis due to pollen: Secondary | ICD-10-CM | POA: Diagnosis not present

## 2020-12-01 DIAGNOSIS — H43811 Vitreous degeneration, right eye: Secondary | ICD-10-CM | POA: Diagnosis not present

## 2020-12-02 DIAGNOSIS — J301 Allergic rhinitis due to pollen: Secondary | ICD-10-CM | POA: Diagnosis not present

## 2020-12-15 ENCOUNTER — Encounter: Payer: Medicare Other | Admitting: Internal Medicine

## 2020-12-16 DIAGNOSIS — J301 Allergic rhinitis due to pollen: Secondary | ICD-10-CM | POA: Diagnosis not present

## 2020-12-23 DIAGNOSIS — J301 Allergic rhinitis due to pollen: Secondary | ICD-10-CM | POA: Diagnosis not present

## 2020-12-24 DIAGNOSIS — J301 Allergic rhinitis due to pollen: Secondary | ICD-10-CM | POA: Diagnosis not present

## 2020-12-30 DIAGNOSIS — J301 Allergic rhinitis due to pollen: Secondary | ICD-10-CM | POA: Diagnosis not present

## 2021-01-04 ENCOUNTER — Ambulatory Visit (INDEPENDENT_AMBULATORY_CARE_PROVIDER_SITE_OTHER): Payer: Medicare Other | Admitting: Internal Medicine

## 2021-01-04 ENCOUNTER — Other Ambulatory Visit: Payer: Self-pay | Admitting: *Deleted

## 2021-01-04 ENCOUNTER — Other Ambulatory Visit: Payer: Self-pay

## 2021-01-04 ENCOUNTER — Encounter: Payer: Self-pay | Admitting: Internal Medicine

## 2021-01-04 VITALS — BP 106/70 | HR 85 | Temp 96.7°F | Resp 16 | Ht 67.0 in | Wt 152.0 lb

## 2021-01-04 DIAGNOSIS — D0512 Intraductal carcinoma in situ of left breast: Secondary | ICD-10-CM

## 2021-01-04 DIAGNOSIS — E559 Vitamin D deficiency, unspecified: Secondary | ICD-10-CM | POA: Diagnosis not present

## 2021-01-04 DIAGNOSIS — I10 Essential (primary) hypertension: Secondary | ICD-10-CM

## 2021-01-04 DIAGNOSIS — M1A9XX0 Chronic gout, unspecified, without tophus (tophi): Secondary | ICD-10-CM | POA: Diagnosis not present

## 2021-01-04 DIAGNOSIS — Z8249 Family history of ischemic heart disease and other diseases of the circulatory system: Secondary | ICD-10-CM

## 2021-01-04 DIAGNOSIS — M109 Gout, unspecified: Secondary | ICD-10-CM

## 2021-01-04 DIAGNOSIS — Z79899 Other long term (current) drug therapy: Secondary | ICD-10-CM | POA: Diagnosis not present

## 2021-01-04 DIAGNOSIS — Z136 Encounter for screening for cardiovascular disorders: Secondary | ICD-10-CM | POA: Diagnosis not present

## 2021-01-04 DIAGNOSIS — E782 Mixed hyperlipidemia: Secondary | ICD-10-CM

## 2021-01-04 DIAGNOSIS — Z1211 Encounter for screening for malignant neoplasm of colon: Secondary | ICD-10-CM

## 2021-01-04 DIAGNOSIS — R7309 Other abnormal glucose: Secondary | ICD-10-CM | POA: Diagnosis not present

## 2021-01-04 MED ORDER — TRIAMTERENE-HCTZ 37.5-25 MG PO TABS: ORAL_TABLET | ORAL | 3 refills | Status: DC

## 2021-01-04 MED ORDER — PRAVASTATIN SODIUM 20 MG PO TABS: ORAL_TABLET | ORAL | 2 refills | Status: DC

## 2021-01-04 MED ORDER — ALLOPURINOL 300 MG PO TABS: ORAL_TABLET | ORAL | 1 refills | Status: DC

## 2021-01-04 NOTE — Patient Instructions (Signed)
Due to recent changes in healthcare laws, you may see the results of your imaging and laboratory studies on MyChart before your provider has had a chance to review them.  We understand that in some cases there may be results that are confusing or concerning to you. Not all laboratory results come back in the same time frame and the provider may be waiting for multiple results in order to interpret others.  Please give [place] 48 hours in order for your provider to thoroughly review all the results before contacting the office for clarification of your results.   +++++++++++++++++++++++++  Vit D  & Vit C 1,000 mg   are recommended to help protect  against the Covid-19 and other Corona viruses.    Also it's recommended  to take  Zinc 50 mg  to help  protect against the Covid-19   and best place to get  is also on [email or web address]  and don't pay more than 6-8 cents /pill !  ================================ Coronavirus (COVID-19) Are you at risk?  Are you at risk for the Coronavirus (COVID-19)?  To be considered HIGH RISK for Coronavirus (COVID-19), you have to meet the following criteria:  . Traveled to [place], [place], [place], [place] or [place]; or in the [place] to [place], [place], [place]  . or [place]; and have fever, cough, and shortness of breath within the last 2 weeks of travel OR . Been in close contact with a person diagnosed with COVID-19 within the last 2 weeks and have  . fever, cough,and shortness of breath .  . IF YOU DO NOT MEET THESE CRITERIA, YOU ARE CONSIDERED LOW RISK FOR COVID-19.  What to do if you are HIGH RISK for COVID-19?  Marland Kitchen If you are having a medical emergency, call 911. . Seek medical care right away. Before you go to a doctor's office, urgent care or emergency department, .  call ahead and tell them about your recent travel, contact with someone diagnosed with COVID-19  .  and your symptoms.  . You should receive instructions from your physician's  office regarding next steps of care.  . When you arrive at healthcare provider, tell the healthcare staff immediately you have returned from  . visiting [place], [place], [place], [place] or [place]; or traveled in the [place] to [place], [place],  . [place] or [place] in the last two weeks or you have been in close contact with a person diagnosed with  . COVID-19 in the last 2 weeks.   . Tell the health care staff about your symptoms: fever, cough and shortness of breath. . After you have been seen by a medical provider, you will be either: o Tested for (COVID-19) and discharged home on quarantine except to seek medical care if  o symptoms worsen, and asked to  - Stay home and avoid contact with others until you get your results (4-5 days)  - Avoid travel on public transportation if possible (such as bus, train, or airplane) or o Sent to the Emergency Department by EMS for evaluation, COVID-19 testing  and  o possible admission depending on your condition and test results.  What to do if you are LOW RISK for COVID-19?  Reduce your risk of any infection by using the same precautions used for avoiding the common cold or flu:  Marland Kitchen Wash your hands often with soap and warm water for at least 20 seconds.  If soap and water are not readily  available,  . use an alcohol-based hand sanitizer with at least 60% alcohol.  . If coughing or sneezing, cover your mouth and nose by coughing or sneezing into the elbow areas of your shirt or coat, .  into a tissue or into your sleeve (not your hands). . Avoid shaking hands with others and consider head nods or verbal greetings only. . Avoid touching your eyes, nose, or mouth with unwashed hands.  . Avoid close contact with people who are sick. . Avoid places or events with large numbers of people in one location, like concerts or sporting events. . Carefully consider travel plans you have or are making. . If you are planning any travel outside or  inside the [place], visit the CDC's Travelers' Health webpage for the latest health notices. . If you have some symptoms but not all symptoms, continue to monitor at home and seek medical attention  . if your symptoms worsen. . If you are having a medical emergency, call 911.   . >>>>>>>>>>>>>>>>>>>>>>>>>>>>>>>>> . We Do NOT Approve of  [facility], [employer]  Our Patients  To Do Home Visits & We Do NOT Approve of [employer] > > > > > > > > > > > > > > > > > > > > > > > > > > > > > > > > > > > > > > >  Preventive Care for Adults  A healthy lifestyle and preventive care can promote health and wellness. Preventive health guidelines for women include the following key practices.  A routine yearly physical is a good way to check with your health care provider about your health and preventive screening. It is a chance to share any concerns and updates on your health and to receive a thorough exam.  Visit your dentist for a routine exam and preventive care every 6 months. Brush your teeth twice a day and floss once a day. Good oral hygiene prevents tooth decay and gum disease.  The frequency of eye exams is based on your age, health, family medical history, use of contact lenses, and other factors. Follow your health care provider's recommendations for frequency of eye exams.  Eat a healthy diet. Foods like vegetables, fruits, whole grains, low-fat dairy products, and lean protein foods contain the nutrients you need without too many calories. Decrease your intake of foods high in solid fats, added sugars, and salt. Eat the right amount of calories for you. Get information about a proper diet from your health care provider, if necessary.  Regular physical exercise is one of the most important things you can do for your health. Most adults should get at least 150 minutes of moderate-intensity exercise (any activity that increases your heart rate and causes you to sweat) each  week. In addition, most adults need muscle-strengthening exercises on 2 or more days a week.  Maintain a healthy weight. The body mass index (BMI) is a screening tool to identify possible weight problems. It provides an estimate of body fat based on height and weight. Your health care provider can find your BMI and can help you achieve or maintain a healthy weight. For adults 20 years and older:  A BMI below 18.5 is considered underweight.  A BMI of 18.5 to 24.9 is normal.  A BMI of 25 to 29.9 is considered overweight.  A BMI of 30 and above is considered obese.  Maintain normal blood lipids and cholesterol levels by exercising and minimizing your intake of  saturated fat. Eat a balanced diet with plenty of fruit and vegetables. If your lipid or cholesterol levels are high, you are over 50, or you are at high risk for heart disease, you may need your cholesterol levels checked more frequently. Ongoing high lipid and cholesterol levels should be treated with medicines if diet and exercise are not working.  If you smoke, find out from your health care provider how to quit. If you do not use tobacco, do not start.  Lung cancer screening is recommended for adults aged 78-80 years who are at high risk for developing lung cancer because of a history of smoking. A yearly low-dose CT scan of the lungs is recommended for people who have at least a 30-pack-year history of smoking and are a current smoker or have quit within the past 15 years. A pack year of smoking is smoking an average of 1 pack of cigarettes a day for 1 year (for example: 1 pack a day for 30 years or 2 packs a day for 15 years). Yearly screening should continue until the smoker has stopped smoking for at least 15 years. Yearly screening should be stopped for people who develop a health problem that would prevent them from having lung cancer treatment.  Avoid use of street drugs. Do not share needles with anyone. Ask for help if you need  support or instructions about stopping the use of drugs.  High blood pressure causes heart disease and increases the risk of stroke.  Ongoing high blood pressure should be treated with medicines if weight loss and exercise do not work.  If you are 14-45 years old, ask your health care provider if you should take aspirin to prevent strokes.  Diabetes screening involves taking a blood sample to check your fasting blood sugar level. This should be done once every 3 years, after age 40, if you are within normal weight and without risk factors for diabetes. Testing should be considered at a younger age or be carried out more frequently if you are overweight and have at least 1 risk factor for diabetes.  Breast cancer screening is essential preventive care for women. You should practice "breast self-awareness." This means understanding the normal appearance and feel of your breasts and may include breast self-examination. Any changes detected, no matter how small, should be reported to a health care provider. Women in their 18s and 30s should have a clinical breast exam (CBE) by a health care provider as part of a regular health exam every 1 to 3 years. After age 71, women should have a CBE every year. Starting at age 39, women should consider having a mammogram (breast X-ray test) every year. Women who have a family history of breast cancer should talk to their health care provider about genetic screening. Women at a high risk of breast cancer should talk to their health care providers about having an MRI and a mammogram every year.  Breast cancer gene (BRCA)-related cancer risk assessment is recommended for women who have family members with BRCA-related cancers. BRCA-related cancers include breast, ovarian, tubal, and peritoneal cancers. Having family members with these cancers may be associated with an increased risk for harmful changes (mutations) in the breast cancer genes BRCA1 and BRCA2. Results of the  assessment will determine the need for genetic counseling and BRCA1 and BRCA2 testing.  Routine pelvic exams to screen for cancer are no longer recommended for nonpregnant women who are considered low risk for cancer of the pelvic organs (ovaries,  uterus, and vagina) and who do not have symptoms. Ask your health care provider if a screening pelvic exam is right for you.  If you have had past treatment for cervical cancer or a condition that could lead to cancer, you need Pap tests and screening for cancer for at least 20 years after your treatment. If Pap tests have been discontinued, your risk factors (such as having a new sexual partner) need to be reassessed to determine if screening should be resumed. Some women have medical problems that increase the chance of getting cervical cancer. In these cases, your health care provider may recommend more frequent screening and Pap tests.    Colorectal cancer can be detected and often prevented. Most routine colorectal cancer screening begins at the age of 62 years and continues through age 28 years. However, your health care provider may recommend screening at an earlier age if you have risk factors for colon cancer. On a yearly basis, your health care provider may provide home test kits to check for hidden blood in the stool. Use of a small camera at the end of a tube, to directly examine the colon (sigmoidoscopy or colonoscopy), can detect the earliest forms of colorectal cancer. Talk to your health care provider about this at age 23, when routine screening begins.  Direct exam of the colon should be repeated every 5-10 years through age 31 years, unless early forms of pre-cancerous polyps or small growths are found.  Osteoporosis is a disease in which the bones lose minerals and strength with aging. This can result in serious bone fractures or breaks. The risk of osteoporosis can be identified using a bone density scan. Women ages 83 years and over and women  at risk for fractures or osteoporosis should discuss screening with their health care providers. Ask your health care provider whether you should take a calcium supplement or vitamin D to reduce the rate of osteoporosis.  Menopause can be associated with physical symptoms and risks. Hormone replacement therapy is available to decrease symptoms and risks. You should talk to your health care provider about whether hormone replacement therapy is right for you.  Use sunscreen. Apply sunscreen liberally and repeatedly throughout the day. You should seek shade when your shadow is shorter than you. Protect yourself by wearing long sleeves, pants, a wide-brimmed hat, and sunglasses year round, whenever you are outdoors.  Once a month, do a whole body skin exam, using a mirror to look at the skin on your back. Tell your health care provider of new moles, moles that have irregular borders, moles that are larger than a pencil eraser, or moles that have changed in shape or color.  Stay current with required vaccines (immunizations).  Influenza vaccine. All adults should be immunized every year.  Tetanus, diphtheria, and acellular pertussis (Td, Tdap) vaccine. Pregnant women should receive 1 dose of Tdap vaccine during each pregnancy. The dose should be obtained regardless of the length of time since the last dose. Immunization is preferred during the 27th-36th week of gestation. An adult who has not previously received Tdap or who does not know her vaccine status should receive 1 dose of Tdap. This initial dose should be followed by tetanus and diphtheria toxoids (Td) booster doses every 10 years. Adults with an unknown or incomplete history of completing a 3-dose immunization series with Td-containing vaccines should begin or complete a primary immunization series including a Tdap dose. Adults should receive a Td booster every 10 years.  Zoster vaccine. One dose is recommended for adults aged 64 years or  older unless certain conditions are present.    Pneumococcal 13-valent conjugate (PCV13) vaccine. When indicated, a person who is uncertain of her immunization history and has no record of immunization should receive the PCV13 vaccine. An adult aged 61 years or older who has certain medical conditions and has not been previously immunized should receive 1 dose of PCV13 vaccine. This PCV13 should be followed with a dose of pneumococcal polysaccharide (PPSV23) vaccine. The PPSV23 vaccine dose should be obtained at least 1 or more year(s) after the dose of PCV13 vaccine. An adult aged 46 years or older who has certain medical conditions and previously received 1 or more doses of PPSV23 vaccine should receive 1 dose of PCV13. The PCV13 vaccine dose should be obtained 1 or more years after the last PPSV23 vaccine dose.    Pneumococcal polysaccharide (PPSV23) vaccine. When PCV13 is also indicated, PCV13 should be obtained first. All adults aged 58 years and older should be immunized. An adult younger than age 1 years who has certain medical conditions should be immunized. Any person who resides in a nursing home or long-term care facility should be immunized. An adult smoker should be immunized. People with an immunocompromised condition and certain other conditions should receive both PCV13 and PPSV23 vaccines. People with human immunodeficiency virus (HIV) infection should be immunized as soon as possible after diagnosis. Immunization during chemotherapy or radiation therapy should be avoided. Routine use of PPSV23 vaccine is not recommended for American Indians, [place] Natives, or people younger than 65 years unless there are medical conditions that require PPSV23 vaccine. When indicated, people who have unknown immunization and have no record of immunization should receive PPSV23 vaccine. One-time revaccination 5 years after the first dose of PPSV23 is recommended for people aged 19-64 years who have chronic  kidney failure, nephrotic syndrome, asplenia, or immunocompromised conditions. People who received 1-2 doses of PPSV23 before age 11 years should receive another dose of PPSV23 vaccine at age 70 years or later if at least 5 years have passed since the previous dose. Doses of PPSV23 are not needed for people immunized with PPSV23 at or after age 65 years.   Preventive Services / Frequency  Ages 56 years and over  Blood pressure check.  Lipid and cholesterol check.  Lung cancer screening. / Every year if you are aged 16-80 years and have a 30-pack-year history of smoking and currently smoke or have quit within the past 15 years. Yearly screening is stopped once you have quit smoking for at least 15 years or develop a health problem that would prevent you from having lung cancer treatment.  Clinical breast exam.** / Every year after age 59 years.   BRCA-related cancer risk assessment.** / For women who have family members with a BRCA-related cancer (breast, ovarian, tubal, or peritoneal cancers).  Mammogram.** / Every year beginning at age 70 years and continuing for as long as you are in good health. Consult with your health care provider.  Pap test.** / Every 3 years starting at age 32 years through age 28 or 9 years with 3 consecutive normal Pap tests. Testing can be stopped between 65 and 70 years with 3 consecutive normal Pap tests and no abnormal Pap or HPV tests in the past 10 years.  Fecal occult blood test (FOBT) of stool. / Every year beginning at age 57 years and continuing until age 29 years. You may not need to  do this test if you get a colonoscopy every 10 years.  Flexible sigmoidoscopy or colonoscopy.** / Every 5 years for a flexible sigmoidoscopy or every 10 years for a colonoscopy beginning at age 61 years and continuing until age 46 years.  Hepatitis C blood test.** / For all people born from [birth year] through 1965 and any individual with known risks for hepatitis  C.  Osteoporosis screening.** / A one-time screening for women ages 17 years and over and women at risk for fractures or osteoporosis.  Skin self-exam. / Monthly.  Influenza vaccine. / Every year.  Tetanus, diphtheria, and acellular pertussis (Tdap/Td) vaccine.** / 1 dose of Td every 10 years.  Zoster vaccine.** / 1 dose for adults aged 59 years or older.  Pneumococcal 13-valent conjugate (PCV13) vaccine.** / Consult your health care provider.  Pneumococcal polysaccharide (PPSV23) vaccine.** / 1 dose for all adults aged 38 years and older. Screening for abdominal aortic aneurysm (AAA)  by ultrasound is recommended for people who have history of high blood pressure or who are current or former smokers. ++++++++++++++++++++ Recommend Adult Low Dose Aspirin or  coated  Aspirin 81 mg daily  To reduce risk of Colon Cancer 40 %,  Skin Cancer 26 % ,  Melanoma 46%  and  Pancreatic cancer 60% ++++++++++++++++++++ Vitamin D goal  is between 70-100.  Please make sure that you are taking your Vitamin D as directed.  It is very important as a natural anti-inflammatory  helping hair, skin, and nails, as well as reducing stroke and heart attack risk.  It helps your bones and helps with mood. It also decreases numerous cancer risks so please take it as directed.  Low Vit D is associated with a 200-300% higher risk for CANCER  and 200-300% higher risk for HEART   ATTACK  &  STROKE.   .....................................Marland Kitchen It is also associated with higher death rate at younger ages,  autoimmune diseases like Rheumatoid arthritis, Lupus, Multiple Sclerosis.    Also many other serious conditions, like depression, Alzheimer's Dementia, infertility, muscle aches, fatigue, fibromyalgia - just to name a few. ++++++++++++++++++ Recommend the book "The END of DIETING" by Dr Excell Seltzer  & the book "The END of DIABETES " by Dr Excell Seltzer At [email or web address] - get book & Audio CD's    Being diabetic has  a  300% increased risk for heart attack, stroke, cancer, and alzheimer- type vascular dementia. It is very important that you work harder with diet by avoiding all foods that are white. Avoid white rice (brown & wild rice is OK), white potatoes (sweetpotatoes in moderation is OK), White bread or wheat bread or anything made out of white flour like bagels, donuts, rolls, buns, biscuits, cakes, pastries, cookies, pizza crust, and pasta (made from white flour & egg whites) - vegetarian pasta or spinach or wheat pasta is OK. Multigrain breads like Arnold's or [employer], or multigrain sandwich thins or flatbreads.  Diet, exercise and weight loss can reverse and cure diabetes in the early stages.  Diet, exercise and weight loss is very important in the control and prevention of complications of diabetes which affects every system in your body, ie. Brain - dementia/stroke, eyes - glaucoma/blindness, heart - heart attack/heart failure, kidneys - dialysis, stomach - gastric paralysis, intestines - malabsorption, nerves - severe painful neuritis, circulation - gangrene & loss of a leg(s), and finally cancer and Alzheimers.    I recommend avoid fried & greasy foods,  sweets/candy, white rice (brown or wild  rice or Quinoa is OK), white potatoes (sweet potatoes are OK) - anything made from white flour - bagels, doughnuts, rolls, buns, biscuits,white and wheat breads, pizza crust and traditional pasta made of white flour & egg white(vegetarian pasta or spinach or wheat pasta is OK).  Multi-grain bread is OK - like multi-grain flat bread or sandwich thins. Avoid alcohol in excess. Exercise is also important.    Eat all the vegetables you want - avoid meat, especially red meat and dairy - especially cheese.  Cheese is the most concentrated form of trans-fats which is the worst thing to clog up our arteries. Veggie cheese is OK which can be found in the fresh produce section at [employer] or [employer] or  [employer]  +++++++++++++++++++ DASH Eating Plan  DASH stands for "Dietary Approaches to Stop Hypertension."   The DASH eating plan is a healthy eating plan that has been shown to reduce high blood pressure (hypertension). Additional health benefits may include reducing the risk of type 2 diabetes mellitus, heart disease, and stroke. The DASH eating plan may also help with weight loss. WHAT DO I NEED TO KNOW ABOUT THE DASH EATING PLAN? For the DASH eating plan, you will follow these general guidelines:  Choose foods with a percent daily value for sodium of less than 5% (as listed on the food label).  Use salt-free seasonings or herbs instead of table salt or sea salt.  Check with your health care provider or pharmacist before using salt substitutes.  Eat lower-sodium products, often labeled as "lower sodium" or "no salt added."  Eat fresh foods.  Eat more vegetables, fruits, and low-fat dairy products.  Choose whole grains. Look for the word "whole" as the first word in the ingredient list.  Choose fish   Limit sweets, desserts, sugars, and sugary drinks.  Choose heart-healthy fats.  Eat veggie cheese   Eat more home-cooked food and less restaurant, buffet, and fast food.  Limit fried foods.  Cook foods using methods other than frying.  Limit canned vegetables. If you do use them, rinse them well to decrease the sodium.  When eating at a restaurant, ask that your food be prepared with less salt, or no salt if possible.                      WHAT FOODS CAN I EAT? Read Dr Fara Olden Fuhrman's books on The End of Dieting & The End of Diabetes  Grains Whole grain or whole wheat bread. Brown rice. Whole grain or whole wheat pasta. Quinoa, bulgur, and whole grain cereals. Low-sodium cereals. Corn or whole wheat flour tortillas. Whole grain cornbread. Whole grain crackers. Low-sodium crackers.  Vegetables Fresh or frozen vegetables (raw, steamed, roasted, or grilled). Low-sodium or  reduced-sodium tomato and vegetable juices. Low-sodium or reduced-sodium tomato sauce and paste. Low-sodium or reduced-sodium canned vegetables.   Fruits All fresh, canned (in natural juice), or frozen fruits.  Protein Products  All fish and seafood.  Dried beans, peas, or lentils. Unsalted nuts and seeds. Unsalted canned beans.  Dairy Low-fat dairy products, such as skim or 1% milk, 2% or reduced-fat cheeses, low-fat ricotta or cottage cheese, or plain low-fat yogurt. Low-sodium or reduced-sodium cheeses.  Fats and Oils Tub margarines without trans fats. Light or reduced-fat mayonnaise and salad dressings (reduced sodium). Avocado. Safflower, olive, or canola oils. Natural peanut or almond butter.  Other Unsalted popcorn and pretzels. The items listed above may not be a complete list of recommended foods  or beverages. Contact your dietitian for more options.  +++++++++++++++  WHAT FOODS ARE NOT RECOMMENDED? Grains/ White flour or wheat flour White bread. White pasta. White rice. Refined cornbread. Bagels and croissants. Crackers that contain trans fat.  Vegetables  Creamed or fried vegetables. Vegetables in a . Regular canned vegetables. Regular canned tomato sauce and paste. Regular tomato and vegetable juices.  Fruits Dried fruits. Canned fruit in light or heavy syrup. Fruit juice.  Meat and Other Protein Products Meat in general - RED meat & White meat.  Fatty cuts of meat. Ribs, chicken wings, all processed meats as bacon, sausage, bologna, salami, fatback, hot dogs, bratwurst and packaged luncheon meats.  Dairy Whole or 2% milk, cream, half-and-half, and cream cheese. Whole-fat or sweetened yogurt. Full-fat cheeses or blue cheese. Non-dairy creamers and whipped toppings. Processed cheese, cheese spreads, or cheese curds.  Condiments Onion and garlic salt, seasoned salt, table salt, and sea salt. Canned and packaged gravies. Worcestershire sauce. Tartar sauce. Barbecue  sauce. Teriyaki sauce. Soy sauce, including reduced sodium. Steak sauce. Fish sauce. Oyster sauce. Cocktail sauce. Horseradish. Ketchup and mustard. Meat flavorings and tenderizers. Bouillon cubes. Hot sauce. Tabasco sauce. Marinades. Taco seasonings. Relishes.  Fats and Oils Butter, stick margarine, lard, shortening and bacon fat. Coconut, palm kernel, or palm oils. Regular salad dressings.  Pickles and olives. Salted popcorn and pretzels.  The items listed above may not be a complete list of foods and beverages to avoid.

## 2021-01-04 NOTE — Progress Notes (Signed)
Comprehensive Evaluation &  Examination      This very nice 70 y.o. MWF presents for a Screening /Preventative Visit & comprehensive evaluation and management of multiple medical co-morbidities.  Patient has been followed for HTN, HLD, Prediabetes  and Vitamin D Deficiency. Patient's Gout is quiescent on her Allopurinol.      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       HTN predates since 1998. Patient's BP has been controlled at home and patient denies any cardiac symptoms as chest pain, palpitations, shortness of breath, dizziness or ankle swelling. Today's BP is 106/70 - at goal.       Patient's hyperlipidemia (2005) is controlled with diet and Pravastatin. Patient denies myalgias or other medication SE's. Last lipids were at goal:  Lab Results  Component Value Date   CHOL 148 05/26/2020   HDL 52 05/26/2020   LDLCALC 77 05/26/2020   TRIG 109 05/26/2020   CHOLHDL 2.8 05/26/2020        Patient has hx/o prediabetes (A1c 5.9% /2012) and patient denies reactive hypoglycemic symptoms, visual blurring, diabetic polys or paresthesias. Last A1c was normal & at goal: Lab Results  Component Value Date   HGBA1C 5.4 05/26/2020        Finally, patient has history of Vitamin D Deficiency ("33" /2018) and last Vitamin D was at goal:  Lab Results  Component Value Date   VD25OH 79 11/27/2019    Current Outpatient Medications on File Prior to Visit  Medication Sig  . ALPRAZolam  0.5 MG tablet Take 1/2 to 1 tablet 2 to 3 x /day as needed for Anxiety  . VITAMIN C 1000 MG tablet Take 1 daily.  Marland Kitchen aspirin EC 81 MG tablet Take 1 tablet  daily.  . Biotin 2500 MCG CAPS Take  daily.  . OSCAL 1500 (600 Ca) MG  Take  daily with breakfast.  . VITAMIN D10,000 Units Take   daily.  Marland Kitchen  VITAMIN B-12  Take 1 tablet  daily.  Marland Kitchen EPINEPHrine 0.3 mg inject INJECT 1 PEN IM ONE TIME AS DIRECTED  . fenofibrate  145 MG tablet TAKE 1 TABLET  DAILY   . FLONASE  nasal spray Use 1 to 2 sprays each Nares 1 to 2 x /day  . ATROVENT 0.03 % nasal spray 2 Spray(s) Both Nares 3-4 Times Daily  . phentermine (37.5 MG tablet Take 1 tablet every Morning    . VITAMIN B-6 100 MG tab Take 1 daily.  Marland Kitchen topiramate50 MG tablet TAKE 2 TO 3 TABLETS DAILY   . zinc 50 MG tablet Take  daily.  Marland Kitchen loratadine  10 MG tablet Take 1 tablet  daily.    No Known Allergies  Past Medical History:  Diagnosis Date  . Allergy   . Anxiety   . Bell palsy 02/04/2016   Left  . Cancer ([place]) 2011   BCC  . Carpal tunnel syndrome on both sides 01/23/2017  . Complication of anesthesia   . Hyperlipidemia   . Hypertension 2005  . Plantar fasciitis   . PONV (postoperative nausea and vomiting)    Health Maintenance  Topic Date Due  . COVID-19 Vaccine (3 - Pfizer risk 4-dose series) 02/21/2020  . MAMMOGRAM  12/03/2020  . DEXA SCAN  05/26/2021 (Originally 12/16/2015)  . TETANUS/TDAP  05/26/2021 (Originally 11/28/2019)  . COLONOSCOPY (Pts 45-81yrs Insurance coverage will need to be confirmed)  03/27/2021  . PNA vac Low Risk Adult  Completed   Immunization History  Administered Date(s) Administered  . Influenza,inj,quad, With Preservative 08/27/2017  . Influenza-Unspecified 10/02/2018, 10/27/2019  . PFIZER(Purple Top)SARS-COV-2 Vaccination 01/03/2020, 01/24/2020  . PPD Test 05/14/2014  . Pneumococcal Polysaccharide-23 02/08/2017    -Patient had Colonoscopies in 2006 & 2011 w/ an adenomatous polyp ([place]). - Last Colon  - 03/27/2016 - Dr Watt Climes - recc 5 yr f/u due May 2022  Last [facility] -  Dec 2021 Teola Bradley   Past Surgical History:  Procedure Laterality Date  . ABDOMINAL HYSTERECTOMY  1990  . basal cell carcinoma  2011   nose  . BREAST LUMPECTOMY WITH RADIOACTIVE SEED LOCALIZATION Left 12/05/2019   Procedure: LEFT BREAST  LUMPECTOMY WITH RADIOACTIVE SEED LOCALIZATION;  Surgeon: Alphonsa Overall, MD;  Location: [place];  Service: General;  Laterality: Left;  . TENDON REPAIR     plantar fasciitis  . TONSILLECTOMY     Family History  Problem Relation Age of Onset  . Hypertension Mother   . Heart attack Mother   . Hyperlipidemia Mother   . Heart disease Mother 8       fatal MI  . Stroke Mother   . Arthritis Mother        rheumatoid  . Hypertension Brother   . Cancer Brother        throat, leukemia  . Parkinson's disease Brother   . Cancer Father        lung   Social History   Tobacco Use  . Smoking status: Never Smoker  . Smokeless tobacco: Never Used  Vaping Use  . Vaping Use: Never used  Substance Use Topics  . Alcohol use: Yes  . Drug use: Never    ROS Constitutional: Denies fever, chills, weight loss/gain, headaches, insomnia,  night sweats, and change in appetite. Does c/o fatigue. Eyes: Denies redness, blurred vision, diplopia, discharge, itchy, watery eyes.  ENT: Denies discharge, congestion, post nasal drip, epistaxis, sore throat, earache, hearing loss, dental pain, Tinnitus, Vertigo, Sinus pain, snoring.  Cardio: Denies chest pain, palpitations, irregular heartbeat, syncope, dyspnea, diaphoresis, orthopnea, PND, claudication, edema Respiratory: denies cough, dyspnea, DOE, pleurisy, hoarseness, laryngitis, wheezing.  Gastrointestinal: Denies dysphagia, heartburn, reflux, water brash, pain, cramps, nausea, vomiting, bloating, diarrhea, constipation, hematemesis, melena, hematochezia, jaundice, hemorrhoids Genitourinary: Denies dysuria, frequency, urgency, nocturia, hesitancy, discharge, hematuria, flank pain Breast: Breast lumps, nipple discharge, bleeding.  Musculoskeletal: Denies arthralgia, myalgia, stiffness, Jt. Swelling, pain, limp, and strain/sprain. Denies falls. Skin: Denies puritis, rash, hives, warts, acne, eczema, changing in skin lesion Neuro: No weakness,  tremor, incoordination, spasms, paresthesia, pain Psychiatric: Denies confusion, memory loss, sensory loss. Denies Depression. Endocrine: Denies change in weight, skin, hair change, nocturia, and paresthesia, diabetic polys, visual blurring, hyper / hypo glycemic episodes.  Heme/Lymph: No excessive bleeding, bruising, enlarged lymph nodes.  Physical Exam  BP 106/70   Pulse 85   Temp (!) 96.7 F (35.9 C)   Resp 16   Ht 5\' 7"  (1.702 m)   Wt 152 lb (68.9 kg)   SpO2 97%   BMI 23.81 kg/m   General Appearance: Well nourished, well groomed and in no apparent distress.  Eyes: PERRLA, EOMs, conjunctiva no swelling or erythema, normal fundi and vessels. Sinuses: No frontal/maxillary tenderness ENT/Mouth: EACs patent / TMs  nl. Nares clear without erythema, swelling, mucoid exudates. Oral hygiene is good. No erythema, swelling,  or exudate. Tongue normal, non-obstructing. Tonsils not swollen or erythematous. Hearing normal.  Neck: Supple, thyroid not palpable. No bruits, nodes or JVD. Respiratory: Respiratory effort normal.  BS equal and clear bilateral without rales, rhonci, wheezing or stridor. Cardio: Heart sounds are normal with regular rate and rhythm and no murmurs, rubs or gallops. Peripheral pulses are normal and equal bilaterally without edema. No aortic or femoral bruits. Chest: symmetric with normal excursions and percussion. Breasts: Symmetric, without lumps, nipple discharge, retractions, or fibrocystic changes.  Abdomen: Flat, soft with bowel sounds active. Nontender, no guarding, rebound, hernias, masses, or organomegaly.  Lymphatics: Non tender without lymphadenopathy.  Musculoskeletal: Full ROM all peripheral extremities, joint stability, 5/5 strength, and normal gait. Skin: Warm and dry without rashes, lesions, cyanosis, clubbing or  ecchymosis.  Neuro: Cranial nerves intact, reflexes equal bilaterally. Normal muscle tone, no cerebellar symptoms. Sensation intact.  Pysch: Alert  and oriented X 3, normal affect, Insight and Judgment appropriate.   Assessment and Plan   1. Essential hypertension  - EKG 12-Lead - Urinalysis, Routine w reflex microscopic - Microalbumin / creatinine urine ratio - CBC with Differential/Platelet - COMPLETE METABOLIC PANEL WITH GFR - Magnesium - TSH  2. Hyperlipidemia, mixed  - EKG 12-Lead - Lipid panel - TSH  3. Abnormal glucose  - EKG 12-Lead - Hemoglobin A1c - Insulin, random  4. Vitamin D deficiency  - VITAMIN D 25 Hydroxy   5. Chronic gout  - Uric acid  6. Ductal carcinoma in situ (DCIS) of left breast   7. Screening for colorectal cancer  - POC Hemoccult Bld/Stl   8. Screening for ischemic heart disease  - EKG 12-Lead  9. FHx: heart disease  - EKG 12-Lead  10. Medication management  - Urinalysis, Routine w reflex microscopic - Microalbumin / creatinine urine ratio - CBC with Differential/Platelet - COMPLETE METABOLIC PANEL WITH GFR - Magnesium - Lipid panel - TSH - Hemoglobin A1c - Insulin, random - VITAMIN D 25 Hydroxy         Patient was counseled in prudent diet to achieve/maintain BMI less than 25 for weight control, BP monitoring, regular exercise and medications. Discussed med's effects and SE's. Screening labs and tests as requested with regular follow-up as recommended. Over 40 minutes of exam, counseling, chart review and high complex critical decision making was performed.   Kirtland Bouchard, MD

## 2021-01-05 LAB — URINALYSIS, ROUTINE W REFLEX MICROSCOPIC
Bilirubin Urine: NEGATIVE
Glucose, UA: NEGATIVE
Hgb urine dipstick: NEGATIVE
Ketones, ur: NEGATIVE
Leukocytes,Ua: NEGATIVE
Nitrite: NEGATIVE
Protein, ur: NEGATIVE
Specific Gravity, Urine: 1.007 (ref 1.001–1.03)
pH: 6 (ref 5.0–8.0)

## 2021-01-05 LAB — COMPLETE METABOLIC PANEL WITH GFR
AG Ratio: 1.6 (calc) (ref 1.0–2.5)
ALT: 17 U/L (ref 6–29)
AST: 21 U/L (ref 10–35)
Albumin: 4.9 g/dL (ref 3.6–5.1)
Alkaline phosphatase (APISO): 74 U/L (ref 37–153)
BUN/Creatinine Ratio: 25 (calc) — ABNORMAL HIGH (ref 6–22)
BUN: 29 mg/dL — ABNORMAL HIGH (ref 7–25)
CO2: 28 mmol/L (ref 20–32)
Calcium: 10.5 mg/dL — ABNORMAL HIGH (ref 8.6–10.4)
Chloride: 99 mmol/L (ref 98–110)
Creat: 1.15 mg/dL — ABNORMAL HIGH (ref 0.60–0.93)
GFR, Est African American: 56 mL/min/{1.73_m2} — ABNORMAL LOW (ref >=60)
GFR, Est Non African American: 48 mL/min/{1.73_m2} — ABNORMAL LOW (ref >=60)
Globulin: 3 g/dL (calc) (ref 1.9–3.7)
Glucose, Bld: 85 mg/dL (ref 65–99)
Potassium: 3.7 mmol/L (ref 3.5–5.3)
Sodium: 138 mmol/L (ref 135–146)
Total Bilirubin: 0.6 mg/dL (ref 0.2–1.2)
Total Protein: 7.9 g/dL (ref 6.1–8.1)

## 2021-01-05 LAB — CBC WITH DIFFERENTIAL/PLATELET
Absolute Monocytes: 396 cells/uL (ref 200–950)
Basophils Absolute: 41 cells/uL (ref 0–200)
Basophils Relative: 0.9 %
Eosinophils Absolute: 290 cells/uL (ref 15–500)
Eosinophils Relative: 6.3 %
HCT: 41 % (ref 35.0–45.0)
Hemoglobin: 14.9 g/dL (ref 11.7–15.5)
Lymphs Abs: 1707 cells/uL (ref 850–3900)
MCH: 31.6 pg (ref 27.0–33.0)
MCHC: 36.3 g/dL — ABNORMAL HIGH (ref 32.0–36.0)
MCV: 87 fL (ref 80.0–100.0)
MPV: 9.5 fL (ref 7.5–12.5)
Monocytes Relative: 8.6 %
Neutro Abs: 2167 cells/uL (ref 1500–7800)
Neutrophils Relative %: 47.1 %
Platelets: 205 10*3/uL (ref 140–400)
RBC: 4.71 10*6/uL (ref 3.80–5.10)
RDW: 12.6 % (ref 11.0–15.0)
Total Lymphocyte: 37.1 %
WBC: 4.6 10*3/uL (ref 3.8–10.8)

## 2021-01-05 LAB — HEMOGLOBIN A1C
Hgb A1c MFr Bld: 5.6 % of total Hgb (ref <5.7)
Mean Plasma Glucose: 114 mg/dL
eAG (mmol/L): 6.3 mmol/L

## 2021-01-05 LAB — LIPID PANEL
Cholesterol: 145 mg/dL (ref <200)
HDL: 49 mg/dL — ABNORMAL LOW (ref >=50)
LDL Cholesterol (Calc): 76 mg/dL (calc)
Non-HDL Cholesterol (Calc): 96 mg/dL (calc) (ref <130)
Total CHOL/HDL Ratio: 3 (calc) (ref <5.0)
Triglycerides: 121 mg/dL (ref <150)

## 2021-01-05 LAB — MAGNESIUM: Magnesium: 2.2 mg/dL (ref 1.5–2.5)

## 2021-01-05 LAB — TSH: TSH: 1.74 mIU/L (ref 0.40–4.50)

## 2021-01-05 LAB — MICROALBUMIN / CREATININE URINE RATIO
Creatinine, Urine: 27 mg/dL (ref 20–275)
Microalb, Ur: <0.2 mg/dL

## 2021-01-05 LAB — URIC ACID: Uric Acid, Serum: 5.3 mg/dL (ref 2.5–7.0)

## 2021-01-05 LAB — INSULIN, RANDOM: Insulin: 7.1 u[IU]/mL

## 2021-01-05 LAB — VITAMIN D 25 HYDROXY (VIT D DEFICIENCY, FRACTURES): Vit D, 25-Hydroxy: 84 ng/mL (ref 30–100)

## 2021-01-05 NOTE — Progress Notes (Signed)
========================================================== -   Test results slightly outside the reference range are not unusual. If there is anything important, I will review this with you,  otherwise it is considered normal test values.  If you have further questions,  please do not hesitate to contact me at the office or via My Chart.  ========================================================== ==========================================================  - Uric Acid /Gout test -Normal & OK - Please continue Allopurinol  ========================================================== ==========================================================  - Kidney functions  ( BUN, Creat & GFR) appear mildly Dehydrated           - So if you're taking your fluid pills every day, suggest you either  cut down to every other day or 3 x /week or better yet  - just stop completely & only take if ankles or legs get very swollen   - And of course it's still very important to drink at least equivalent of                                                                            6 bottles (16 oz) of fluids /day  ========================================================== ==========================================================  - calcium is a little elevated , So suggest that you                                                         cut out calcium supplements for now ========================================================== ==========================================================  - Total Chol = 145 and LDL Chol = 76 - Both   Excellent   - Very low risk for Heart Attack  / Stroke ======================================================== ==========================================================  - A1c still staying Normal - Great - No Diabetes  ! ========================================================== ==========================================================  - Vitamin D = 84 - Excellent   ! ========================================================== ==========================================================  - All Else - CBC - U/A  - Electrolytes - Liver - Magnesium & Thyroid    - all  Normal / OK ===========================================================  ===========================================================  - Keep up the [place] Work  ! =========================================================== ===========================================================

## 2021-01-06 DIAGNOSIS — J301 Allergic rhinitis due to pollen: Secondary | ICD-10-CM | POA: Diagnosis not present

## 2021-01-11 ENCOUNTER — Encounter: Payer: Self-pay | Admitting: Internal Medicine

## 2021-01-13 DIAGNOSIS — J301 Allergic rhinitis due to pollen: Secondary | ICD-10-CM | POA: Diagnosis not present

## 2021-01-17 NOTE — Assessment & Plan Note (Signed)
12/05/2019: Left lumpectomy by Dr. Lucia Gaskins: Intermediate grade DCIS, 0.3 cm, margins clear, ER 100%, PR 90% Staging: Tis NX stage 0  [place]: [facility] [place] 2.Adjuvant antiestrogen therapy with tamoxifen (started 5 mg now taking 5 mg every other day) Discontinued it (dizziness, fogginess and confusion and fatigue  ---------------------------------------------------------------------------------------------------------------------------------------------------  Breast Cancer Surveillance: 1. Breast Exam: Benign 2. Mammogram: 11/16/20: Benign Density Cat B at [facility]  RTC in 1 year

## 2021-01-17 NOTE — Progress Notes (Signed)
Patient Care Team: Lucky Cowboy, MD as PCP - General (Internal Medicine) [name], NP as Nurse Practitioner (Obstetrics and Gynecology) Vida Rigger, MD as Consulting Physician (Gastroenterology) Sheran Luz, MD as Consulting Physician (Physical Medicine and Rehabilitation) Elmon Else, MD as Consulting Physician (Dermatology) Serena Croissant, MD as Consulting Physician (Hematology and Oncology) Ovidio Kin, MD as Consulting Physician (General Surgery) Carmina Miller, MD as Referring Physician (Radiation Oncology)  DIAGNOSIS:    ICD-10-CM   1. Ductal carcinoma in situ (DCIS) of left breast  D05.12     SUMMARY OF ONCOLOGIC HISTORY: Oncology History  Ductal carcinoma in situ (DCIS) of left breast  11/17/2019 Initial Diagnosis   Screening mammogram detected left breast density UOQ 5 mm at 10 o'clock position.  Stereotactic biopsy revealed intermediate grade DCIS ER/PR positive   11/19/2019 Cancer Staging   Staging form: Breast, AJCC 8th Edition - Clinical stage from 11/19/2019: Stage 0 (cTis (DCIS), cN0, cM0, G2, ER+, PR+, HER2: Not Assessed)   12/05/2019 Surgery   Left lumpectomy Ezzard Standing) [phone]): DCIS, intermediate grade, 0.3cm, clear margins. No regional lymph nodes examined.   12/05/2019 Cancer Staging   Staging form: Breast, AJCC 8th Edition - Pathologic stage from 12/05/2019: Stage 0 (pTis (DCIS), pN0, cM0, ER+, PR+)   12/31/2019 - 02/03/2020 Radiation Therapy   Adjuvant radiation at [facility]   01/2020 - 01/2020 Anti-estrogen oral therapy   Tamoxifen 5 mg daily, discontinued due to severe debilitating side effects which included dizziness, lightheadedness, mental fogginess, irritability, hot flashes, etc.     CHIEF COMPLIANT: Follow-up of left breast DCIS   INTERVAL HISTORY: [name] is a 70 y.o. with above-mentioned history of left breast DCIStreated with a left lumpectomy, radiation, and she could not take antiestrogen therapy with tamoxifen  because of adverse effects. Mammogram on 11/16/20 showed no evidence of malignancy bilaterally. She presents to the clinic today for follow-up.    She complains of tenderness in the left breast in the upper outer quadrant where she had surgery and lymph node resection.  ALLERGIES:  has No Known Allergies.  MEDICATIONS:  Current Outpatient Medications  Medication Sig Dispense Refill  . allopurinol (ZYLOPRIM) 300 MG tablet TAKE 1 TABLET DAILY FOR GOUT PREVENTION 90 tablet 1  . ALPRAZolam (XANAX) 0.5 MG tablet Take 1/2 to 1 tablet 2 to 3 x /day as needed for Anxiety 90 tablet 0  . Ascorbic Acid (VITAMIN C) 1000 MG tablet Take 1,000 mg by mouth daily.    Marland Kitchen aspirin EC 81 MG tablet Take 1 tablet (81 mg total) by mouth daily.    . Biotin 2500 MCG CAPS Take by mouth daily.    . calcium carbonate (OSCAL) 1500 (600 Ca) MG TABS tablet Take by mouth daily with breakfast.    . Cholecalciferol (VITAMIN D PO) Take 10,000 Units by mouth daily.    . Cyanocobalamin (VITAMIN B-12 PO) Take 1 tablet by mouth daily.    Marland Kitchen EPINEPHrine 0.3 mg/0.3 mL IJ SOAJ injection INJECT 1 PEN IN THE MUSCLE ONE TIME AS DIRECTED    . fenofibrate (TRICOR) 145 MG tablet TAKE 1 TABLET BY MOUTH ONCE DAILY FOR  TRIGLYCERIDES  (  BLOOD  FATS) 90 tablet 0  . fluticasone (FLONASE) 50 MCG/ACT nasal spray Use 1 to 2 sprays each Nares 1 to 2 x /day 48 g 3  . ipratropium (ATROVENT) 0.03 % nasal spray SMARTSIG:2 Spray(s) Both Nares 3-4 Times Daily    . loratadine (CLARITIN) 10 MG tablet Take 1 tablet (10 mg total)  by mouth daily. 90 tablet 1  . phentermine (ADIPEX-P) 37.5 MG tablet Take    1 tablet     every Morning     for Dieting & Weight Loss 90 tablet 1  . pravastatin (PRAVACHOL) 20 MG tablet TAKE 1 TABLET AT BEDTIME FOR CHOLESTEROL 90 tablet 2  . pyridOXINE (VITAMIN B-6) 100 MG tablet Take 100 mg by mouth daily.    Marland Kitchen topiramate (TOPAMAX) 50 MG tablet TAKE 2 TO 3 TABLETS BY MOUTH DAILY FOR DIETING AND WEIGHT LOSS 270 tablet 1  .  triamterene-hydrochlorothiazide (MAXZIDE-25) 37.5-25 MG tablet Take 1/2 to 1 tablet Daily for BP & Fluid Retention 90 tablet 3  . zinc gluconate 50 MG tablet Take 50 mg by mouth daily.     No current facility-administered medications for this visit.    PHYSICAL EXAMINATION: ECOG PERFORMANCE STATUS: 1 - Symptomatic but completely ambulatory  Vitals:   01/18/21 0912  BP: 129/64  Pulse: 87  Resp: 18  Temp: (!) 97.5 F (36.4 C)  SpO2: 100%   Filed Weights   01/18/21 0912  Weight: 156 lb 3.2 oz (70.9 kg)    BREAST: No palpable masses or nodules in either right or left breasts. No palpable axillary supraclavicular or infraclavicular adenopathy no breast tenderness or nipple discharge. (exam performed in the presence of a chaperone)  LABORATORY DATA:  I have reviewed the data as listed CMP Latest Ref Rng & Units 01/04/2021 05/26/2020 11/19/2019  Glucose 65 - 99 mg/dL 85 85 161(W)  BUN 7 - 25 mg/dL 96(E) 19 17  Creatinine 0.60 - 0.93 mg/dL 4.54(U) 9.81(X) 9.14  Sodium 135 - 146 mmol/L 138 139 143  Potassium 3.5 - 5.3 mmol/L 3.7 3.7 3.8  Chloride 98 - 110 mmol/L 99 101 104  CO2 20 - 32 mmol/L 28 31 31   Calcium 8.6 - 10.4 mg/dL 10.5(H) 10.4 10.0  Total Protein 6.1 - 8.1 g/dL 7.9 7.3 7.0  Total Bilirubin 0.2 - 1.2 mg/dL 0.6 0.7 0.4  Alkaline Phos 38 - 126 U/L - - 76  AST 10 - 35 U/L 21 25 19   ALT 6 - 29 U/L 17 19 14     Lab Results  Component Value Date   WBC 4.6 01/04/2021   HGB 14.9 01/04/2021   HCT 41.0 01/04/2021   MCV 87.0 01/04/2021   PLT 205 01/04/2021   NEUTROABS 2,167 01/04/2021    ASSESSMENT & PLAN:  Ductal carcinoma in situ (DCIS) of left breast 12/05/2019: Left lumpectomy by Dr. Ezzard Standing: Intermediate grade DCIS, 0.3 cm, margins clear, ER 100%, PR 90% Staging: Tis NX stage 0  Treatment plan: [facility] [place] [facility] [place] 2.Adjuvant antiestrogen therapy with tamoxifen (started 5 mg now taking 5 mg every other day) Discontinued it  (dizziness, fogginess and confusion and fatigue  ---------------------------------------------------------------------------------------------------------------------------------------------------  Breast Cancer Surveillance: 1. Breast Exam: Benign 2. Mammogram: 11/16/20: Benign Density Cat B at [facility]  Left breast tenderness: Benign postoperative tenderness.  No palpable lumps or nodules of concern RTC in 1 year     No orders of the defined types were placed in this encounter.  The patient has a good understanding of the overall plan. she agrees with it. she will call with any problems that may develop before the next visit here.  Total time spent: 20 mins including face to face time and time spent for planning, charting and coordination of care  Sabas Sous, MD, MPH 01/18/2021  I, Kirt Boys Dorshimer, am acting as scribe for Dr. Mikey College  Raigan Baria.  I have reviewed the above documentation for accuracy and completeness, and I agree with the above.

## 2021-01-18 ENCOUNTER — Inpatient Hospital Stay: Payer: Medicare Other | Attending: Hematology and Oncology | Admitting: Hematology and Oncology

## 2021-01-18 ENCOUNTER — Other Ambulatory Visit: Payer: Self-pay

## 2021-01-18 DIAGNOSIS — D0512 Intraductal carcinoma in situ of left breast: Secondary | ICD-10-CM | POA: Diagnosis not present

## 2021-01-18 DIAGNOSIS — Z853 Personal history of malignant neoplasm of breast: Secondary | ICD-10-CM | POA: Insufficient documentation

## 2021-01-19 ENCOUNTER — Ambulatory Visit (INDEPENDENT_AMBULATORY_CARE_PROVIDER_SITE_OTHER): Payer: Medicare Other | Admitting: Dermatology

## 2021-01-19 DIAGNOSIS — L570 Actinic keratosis: Secondary | ICD-10-CM | POA: Diagnosis not present

## 2021-01-19 DIAGNOSIS — L578 Other skin changes due to chronic exposure to nonionizing radiation: Secondary | ICD-10-CM | POA: Diagnosis not present

## 2021-01-19 DIAGNOSIS — L820 Inflamed seborrheic keratosis: Secondary | ICD-10-CM

## 2021-01-19 DIAGNOSIS — L821 Other seborrheic keratosis: Secondary | ICD-10-CM

## 2021-01-19 NOTE — Patient Instructions (Signed)

## 2021-01-19 NOTE — Progress Notes (Signed)
   Follow-Up Visit   Subjective  [name] is a 70 y.o. female who presents for the following: Other (Spots of forehead and hands that get scaly).  The following portions of the chart were reviewed this encounter and updated as appropriate:   Tobacco  Allergies  Meds  Problems  Med Hx  Surg Hx  Fam Hx     Review of Systems:  No other skin or systemic complaints except as noted in HPI or Assessment and Plan.  Objective  Well appearing patient in no apparent distress; mood and affect are within normal limits.  A focused examination was performed including face, arms, hands. Relevant physical exam findings are noted in the Assessment and Plan.  Objective  Face x 2, arms/hands x 42 (44): Erythematous keratotic or waxy stuck-on papule or plaque.   Objective  Left Forehead: Erythematous thin papules/macules with gritty scale.    Assessment & Plan    Actinic Damage - chronic, secondary to cumulative UV radiation exposure/sun exposure over time - diffuse scaly erythematous macules with underlying dyspigmentation - Recommend daily broad spectrum sunscreen SPF 30+ to sun-exposed areas, reapply every 2 hours as needed.  - Call for new or changing lesions.  Seborrheic Keratoses - Stuck-on, waxy, tan-brown papules and plaques  - Discussed benign etiology and prognosis. - Observe - Call for any changes  Inflamed seborrheic keratosis (44) Face x 2, arms/hands x 42  Destruction of lesion - Face x 2, arms/hands x 42 Complexity: simple   Destruction method: cryotherapy   Informed consent: discussed and consent obtained   Timeout:  patient name, date of birth, surgical site, and procedure verified Lesion destroyed using liquid nitrogen: Yes   Region frozen until ice ball extended beyond lesion: Yes   Outcome: patient tolerated procedure well with no complications   Post-procedure details: wound care instructions given    AK (actinic keratosis) Left Forehead  Destruction  of lesion - Left Forehead Complexity: simple   Destruction method: cryotherapy   Informed consent: discussed and consent obtained   Timeout:  patient name, date of birth, surgical site, and procedure verified Lesion destroyed using liquid nitrogen: Yes   Region frozen until ice ball extended beyond lesion: Yes   Outcome: patient tolerated procedure well with no complications   Post-procedure details: wound care instructions given    Return for 6-8 weeks ISK follow up.   I, Ashok Cordia, CMA, am acting as scribe for Sarina Ser, MD .  Documentation: I have reviewed the above documentation for accuracy and completeness, and I agree with the above.  Sarina Ser, MD

## 2021-01-20 DIAGNOSIS — J301 Allergic rhinitis due to pollen: Secondary | ICD-10-CM | POA: Diagnosis not present

## 2021-01-22 ENCOUNTER — Encounter: Payer: Self-pay | Admitting: Dermatology

## 2021-01-26 ENCOUNTER — Ambulatory Visit: Payer: Medicare Other | Admitting: Dermatology

## 2021-01-27 DIAGNOSIS — J301 Allergic rhinitis due to pollen: Secondary | ICD-10-CM | POA: Diagnosis not present

## 2021-02-03 DIAGNOSIS — J301 Allergic rhinitis due to pollen: Secondary | ICD-10-CM | POA: Diagnosis not present

## 2021-02-07 ENCOUNTER — Ambulatory Visit
Admission: RE | Admit: 2021-02-07 | Discharge: 2021-02-07 | Disposition: A | Discharge status: Home / Self Care | Payer: Medicare Other | Source: Ambulatory Visit | Attending: Radiation Oncology | Admitting: Radiation Oncology

## 2021-02-07 ENCOUNTER — Encounter: Payer: Self-pay | Admitting: Radiation Oncology

## 2021-02-07 ENCOUNTER — Other Ambulatory Visit: Payer: Self-pay

## 2021-02-07 VITALS — BP 126/72 | HR 79 | Temp 96.9°F | Resp 16 | Wt 154.0 lb

## 2021-02-07 DIAGNOSIS — Z923 Personal history of irradiation: Secondary | ICD-10-CM | POA: Insufficient documentation

## 2021-02-07 DIAGNOSIS — D0512 Intraductal carcinoma in situ of left breast: Secondary | ICD-10-CM

## 2021-02-07 DIAGNOSIS — Z86000 Personal history of in-situ neoplasm of breast: Secondary | ICD-10-CM | POA: Insufficient documentation

## 2021-02-07 DIAGNOSIS — Z08 Encounter for follow-up examination after completed treatment for malignant neoplasm: Secondary | ICD-10-CM | POA: Diagnosis not present

## 2021-02-07 NOTE — Progress Notes (Signed)
Radiation Oncology Follow up Note  Name: [name]   Date:   02/07/2021 MRN:  [id number] DOB: [birth year]    This 70 y.o. female presents to the clinic today for 1 year follow-up status post whole breast radiation to her left breast for ER/PR positive ductal carcinoma in situ.  REFERRING PROVIDER: Unk Pinto, MD  HPI: Patient is a 70 year old female now out 1 year and completed whole breast radiation to her left breast for ER/PR positive ductal carcinoma in situ.  Seen today in routine follow-up she is doing well specifically denies breast tenderness cough or bone pain..  She mammograms back in December which I have reviewed were benign.  Patient is not on antiestrogen therapy based on the side effect profile.  COMPLICATIONS OF TREATMENT: none  FOLLOW UP COMPLIANCE: keeps appointments   PHYSICAL EXAM:  BP 126/72 (BP Location: Left Arm, Patient Position: Sitting)   Pulse 79   Temp (!) 96.9 F (36.1 C) (Tympanic)   Resp 16   Wt 154 lb (69.9 kg)   BMI 24.12 kg/m  Lungs are clear to A&P cardiac examination essentially unremarkable with regular rate and rhythm. No dominant mass or nodularity is noted in either breast in 2 positions examined. Incision is well-healed. No axillary or supraclavicular adenopathy is appreciated. Cosmetic result is excellent.  Well-developed well-nourished patient in NAD. HEENT reveals PERLA, EOMI, discs not visualized.  Oral cavity is clear. No oral mucosal lesions are identified. Neck is clear without evidence of cervical or supraclavicular adenopathy. Lungs are clear to A&P. Cardiac examination is essentially unremarkable with regular rate and rhythm without murmur rub or thrill. Abdomen is benign with no organomegaly or masses noted. Motor sensory and DTR levels are equal and symmetric in the upper and lower extremities. Cranial nerves II through XII are grossly intact. Proprioception is intact. No peripheral adenopathy or edema is identified. No motor or  sensory levels are noted. Crude visual fields are within normal range.  RADIOLOGY RESULTS: Mammograms reviewed compatible with above-stated findings  PLAN: Present time patient is doing well with no evidence of disease 1 year out.  Of asked to see her back in 1 year for follow-up.  She is already scheduled for follow-up mammograms.  Patient knows to call at anytime with any concerns.  I would like to take this opportunity to thank you for allowing me to participate in the care of your patient.Noreene Filbert, MD

## 2021-02-10 DIAGNOSIS — J301 Allergic rhinitis due to pollen: Secondary | ICD-10-CM | POA: Diagnosis not present

## 2021-02-14 ENCOUNTER — Other Ambulatory Visit: Payer: Self-pay | Admitting: Internal Medicine

## 2021-02-14 DIAGNOSIS — Z79899 Other long term (current) drug therapy: Secondary | ICD-10-CM

## 2021-02-14 MED ORDER — PHENTERMINE HCL 37.5 MG PO TABS
ORAL_TABLET | ORAL | 1 refills | Status: DC
Start: 2021-02-14 — End: 2021-08-24

## 2021-02-17 DIAGNOSIS — J301 Allergic rhinitis due to pollen: Secondary | ICD-10-CM | POA: Diagnosis not present

## 2021-02-24 DIAGNOSIS — J301 Allergic rhinitis due to pollen: Secondary | ICD-10-CM | POA: Diagnosis not present

## 2021-03-03 DIAGNOSIS — J301 Allergic rhinitis due to pollen: Secondary | ICD-10-CM | POA: Diagnosis not present

## 2021-03-09 ENCOUNTER — Other Ambulatory Visit: Payer: Self-pay

## 2021-03-09 ENCOUNTER — Encounter: Payer: Self-pay | Admitting: Dermatology

## 2021-03-09 ENCOUNTER — Ambulatory Visit (INDEPENDENT_AMBULATORY_CARE_PROVIDER_SITE_OTHER): Payer: Medicare Other | Admitting: Dermatology

## 2021-03-09 DIAGNOSIS — L820 Inflamed seborrheic keratosis: Secondary | ICD-10-CM | POA: Diagnosis not present

## 2021-03-09 DIAGNOSIS — L905 Scar conditions and fibrosis of skin: Secondary | ICD-10-CM | POA: Diagnosis not present

## 2021-03-09 DIAGNOSIS — L578 Other skin changes due to chronic exposure to nonionizing radiation: Secondary | ICD-10-CM

## 2021-03-09 DIAGNOSIS — L821 Other seborrheic keratosis: Secondary | ICD-10-CM | POA: Diagnosis not present

## 2021-03-09 NOTE — Patient Instructions (Signed)

## 2021-03-09 NOTE — Progress Notes (Signed)
   Follow-Up Visit   Subjective  [name] is a 70 y.o. female who presents for the following: ISK (Irritated skin lesions on the arms, neck, ears, and chest ). She also wonders about treatment of traumatic scar of the leg that it has remained discolored.  The following portions of the chart were reviewed this encounter and updated as appropriate:   Tobacco  Allergies  Meds  Problems  Med Hx  Surg Hx  Fam Hx     Review of Systems:  No other skin or systemic complaints except as noted in HPI or Assessment and Plan.  Objective  Well appearing patient in no apparent distress; mood and affect are within normal limits.  A focused examination was performed including the trunk, extremities, ears. Relevant physical exam findings are noted in the Assessment and Plan.  Objective  arms, hands, L ear, chest, neck (35): Erythematous keratotic or waxy stuck-on papule or plaque.   Objective  R lower leg: Dyspigmented smooth macule or patch.   Assessment & Plan  Inflamed seborrheic keratosis (35) arms, hands, L ear, chest, neck  Destruction of lesion - arms, hands, L ear, chest, neck Complexity: simple   Destruction method: cryotherapy   Informed consent: discussed and consent obtained   Timeout:  patient name, date of birth, surgical site, and procedure verified Lesion destroyed using liquid nitrogen: Yes   Region frozen until ice ball extended beyond lesion: Yes   Outcome: patient tolerated procedure well with no complications   Post-procedure details: wound care instructions given    Scar conditions and fibrosis of skin R lower leg From trauma - recommend Serica scar gel formula daily   Actinic Damage - chronic, secondary to cumulative UV radiation exposure/sun exposure over time - diffuse scaly erythematous macules with underlying dyspigmentation - Recommend daily broad spectrum sunscreen SPF 30+ to sun-exposed areas, reapply every 2 hours as needed.  - Recommend staying  in the shade or wearing long sleeves, sun glasses (UVA+UVB protection) and wide brim hats (4-inch brim around the entire circumference of the hat). - Call for new or changing lesions.  Seborrheic Keratoses - Stuck-on, waxy, tan-brown papules and/or plaques  - Benign-appearing - Discussed benign etiology and prognosis. - Observe - Call for any changes  Return in about 10 weeks (around 05/18/2021).  Luther Redo, CMA, am acting as scribe for Sarina Ser, MD .  Documentation: I have reviewed the above documentation for accuracy and completeness, and I agree with the above.  Sarina Ser, MD

## 2021-03-10 DIAGNOSIS — J301 Allergic rhinitis due to pollen: Secondary | ICD-10-CM | POA: Diagnosis not present

## 2021-03-16 DIAGNOSIS — J301 Allergic rhinitis due to pollen: Secondary | ICD-10-CM | POA: Diagnosis not present

## 2021-03-24 DIAGNOSIS — J301 Allergic rhinitis due to pollen: Secondary | ICD-10-CM | POA: Diagnosis not present

## 2021-03-31 DIAGNOSIS — J301 Allergic rhinitis due to pollen: Secondary | ICD-10-CM | POA: Diagnosis not present

## 2021-04-07 DIAGNOSIS — J301 Allergic rhinitis due to pollen: Secondary | ICD-10-CM | POA: Diagnosis not present

## 2021-04-11 ENCOUNTER — Other Ambulatory Visit: Payer: Self-pay | Admitting: Internal Medicine

## 2021-04-11 MED ORDER — DEXAMETHASONE 2 MG PO TABS
ORAL_TABLET | ORAL | 0 refills | Status: DC
Start: 2021-04-11 — End: 2021-04-28

## 2021-04-12 IMAGING — CR DG CHEST 2V
1 series · 2 of 2 positions shown · non-contrast
Comparison: Chest x-ray 12/12/2004.

CLINICAL DATA: 68-year-old female with history of cough. Tested
positive for TRK1D-MA last week.

EXAM:
CHEST - 2 VIEW

[Series 1: w chest pa · 0.14mm/px · 2 of 2 slices shown]
[im 1/2]
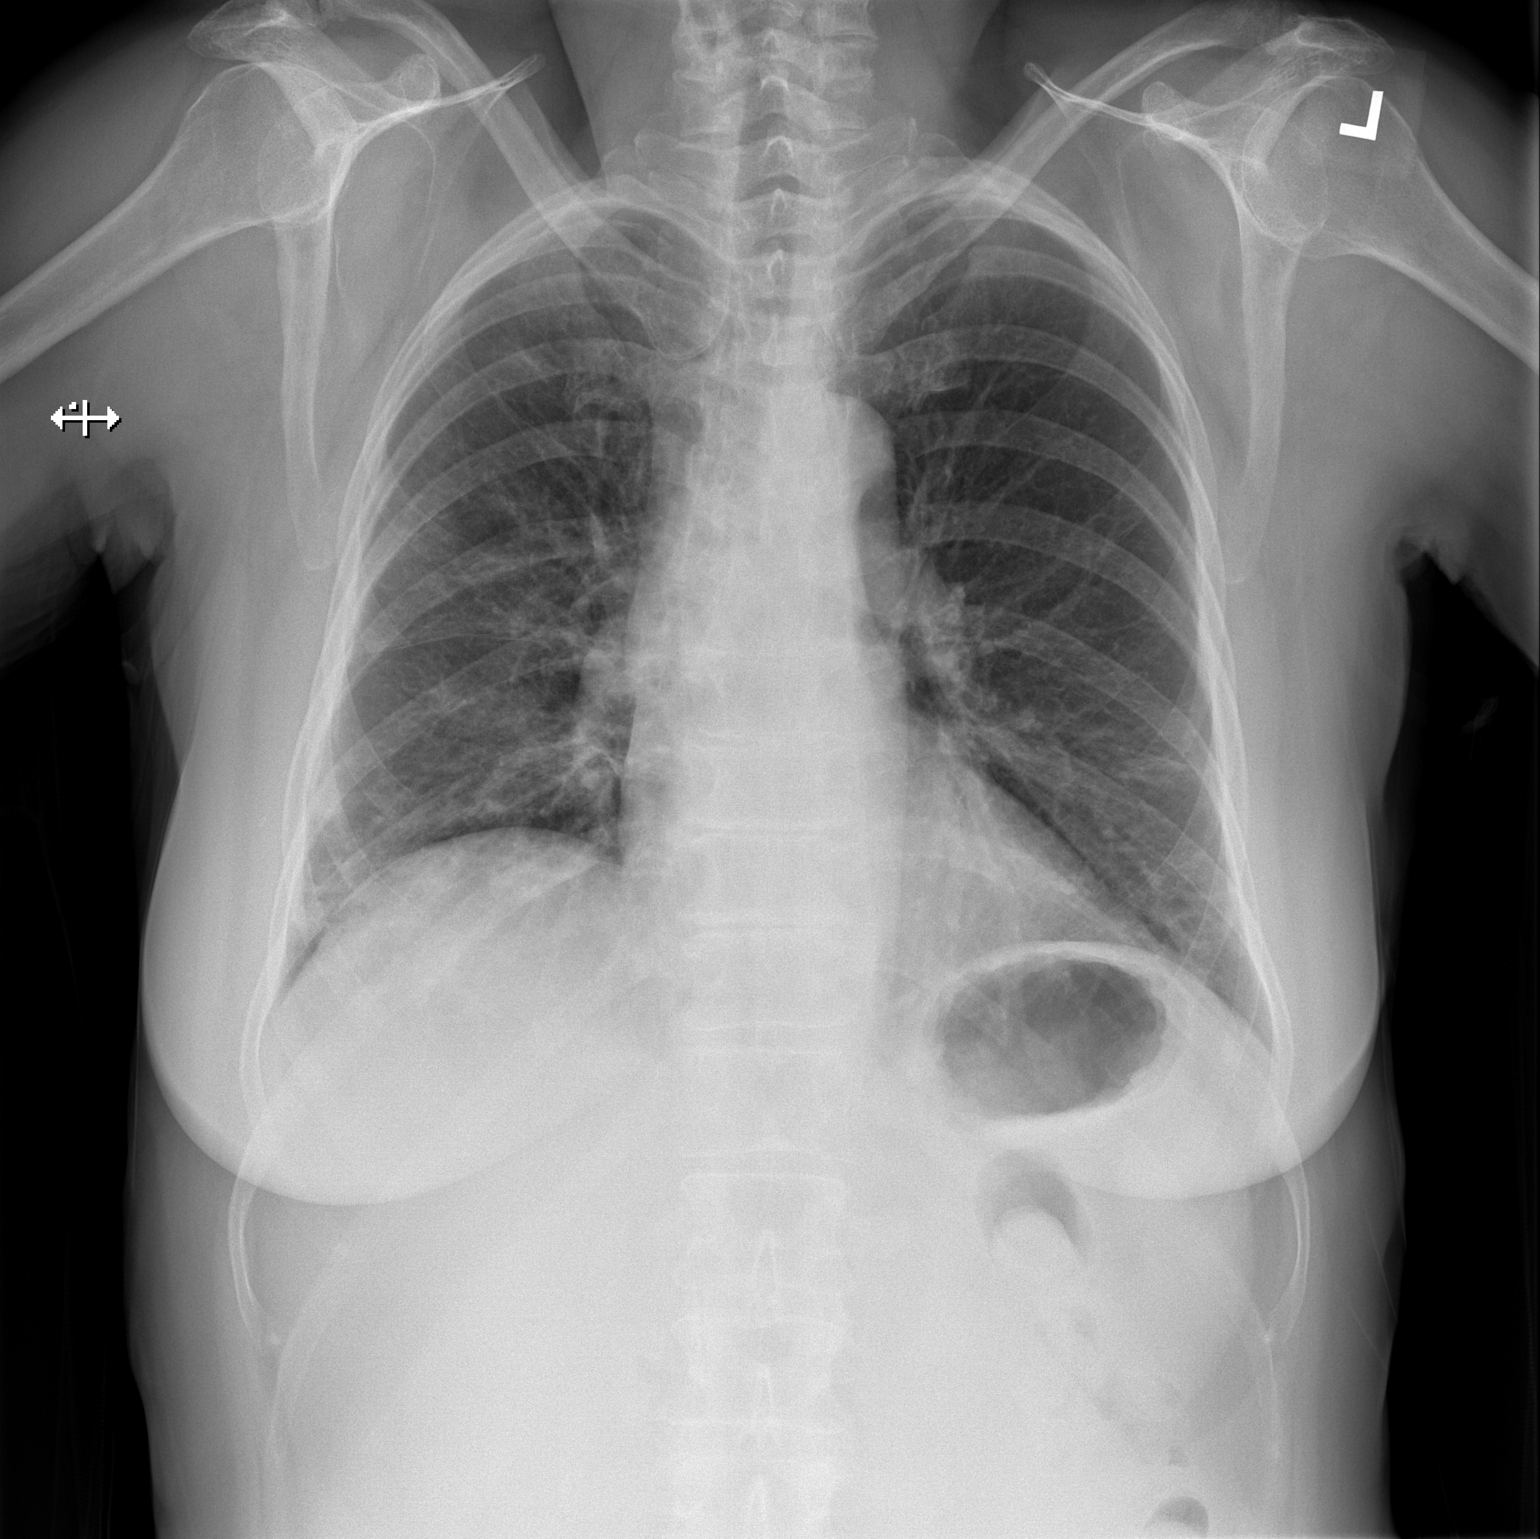
[im 2/2]
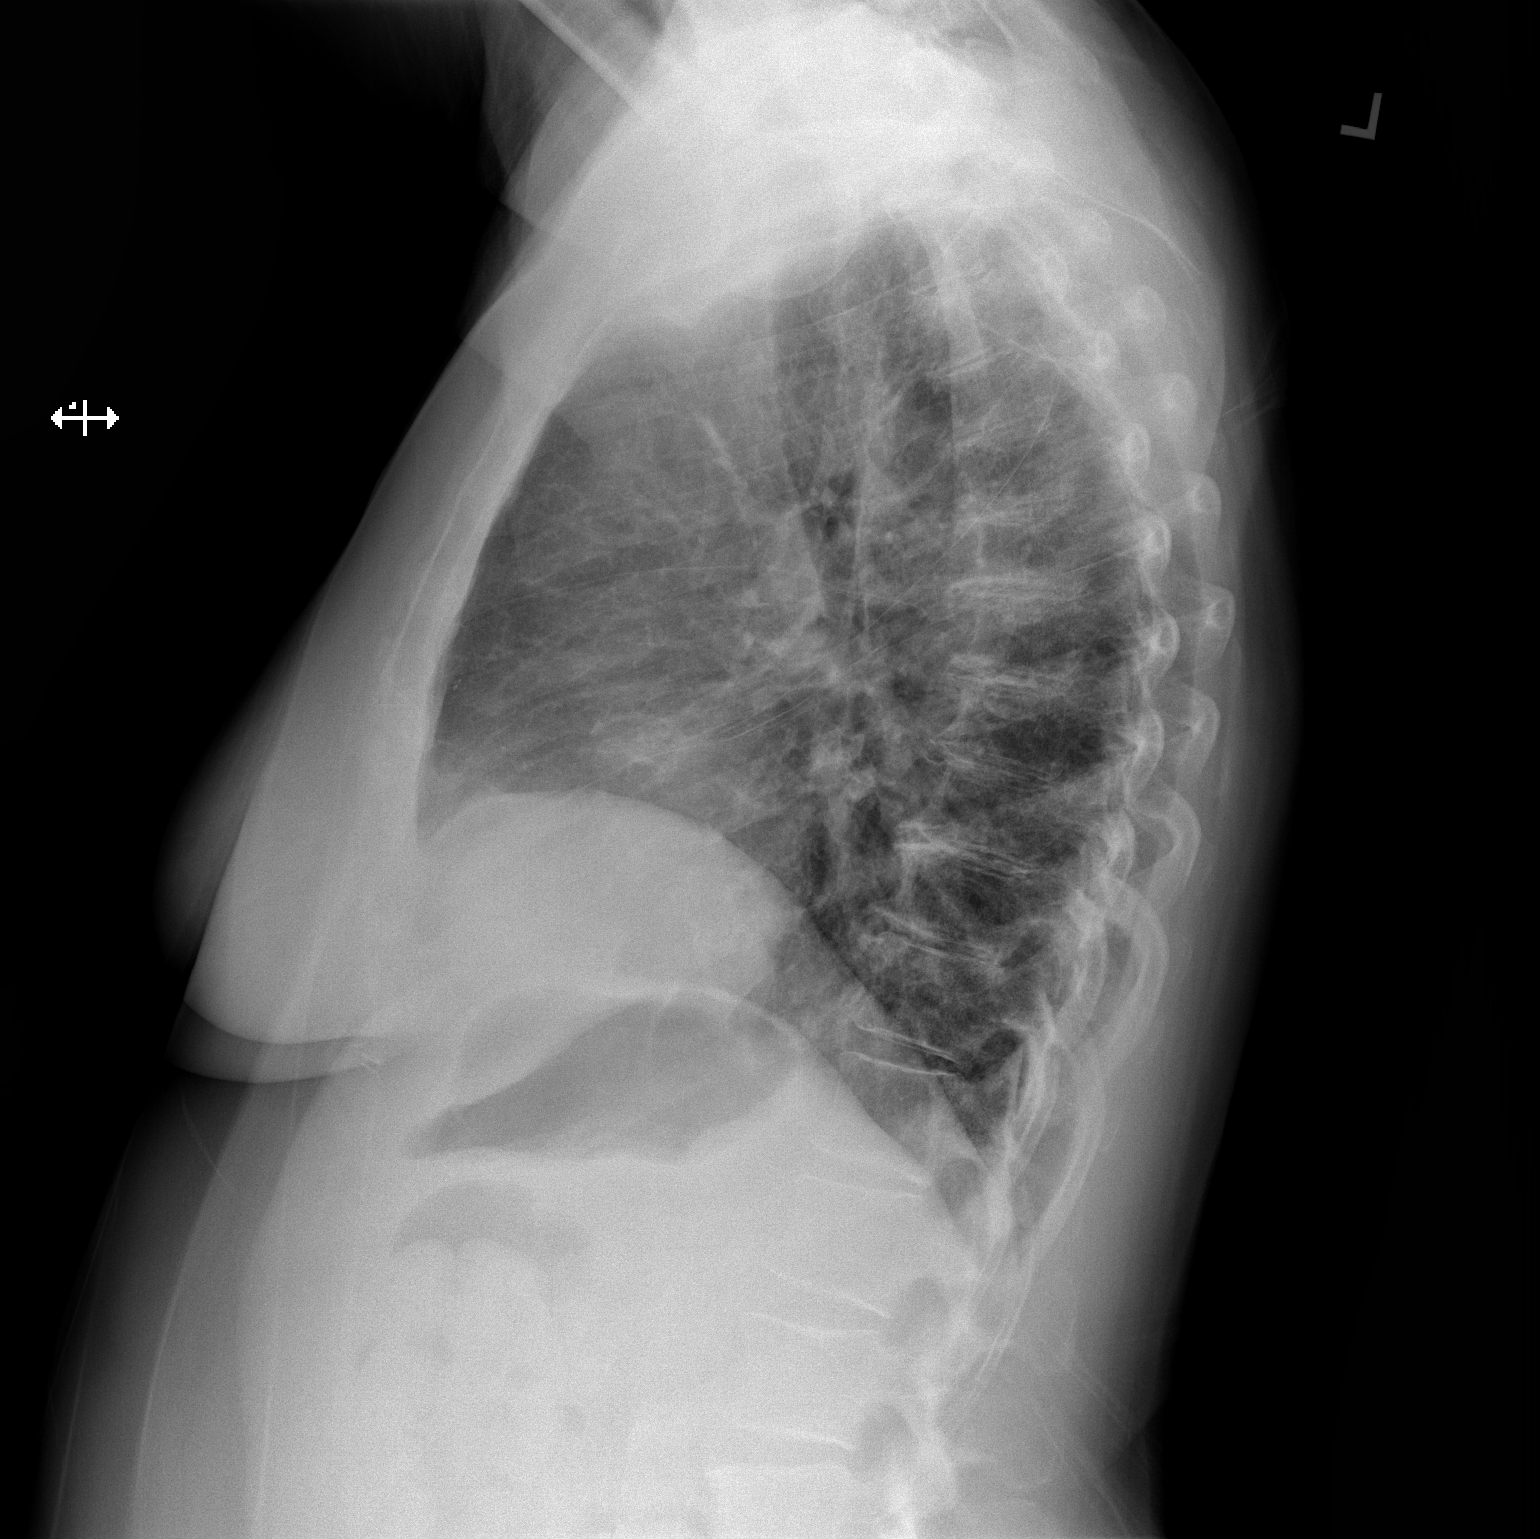

[2 of 2 positions shown; findings below may reference images not displayed]

FINDINGS: Patchy ill-defined opacities in the right mid to lower lung and left
lung base concerning for multilobar bronchopneumonia. No
pneumothorax. No pleural effusions. No evidence of pulmonary edema.
Heart size is normal. Upper mediastinal contours are within normal
limits.
IMPRESSION: 1. Findings concerning for multilobar bilateral bronchopneumonia, as
above, likely secondary to reported TRK1D-MA infection.

## 2021-04-14 DIAGNOSIS — J301 Allergic rhinitis due to pollen: Secondary | ICD-10-CM | POA: Diagnosis not present

## 2021-04-21 DIAGNOSIS — J301 Allergic rhinitis due to pollen: Secondary | ICD-10-CM | POA: Diagnosis not present

## 2021-04-28 ENCOUNTER — Other Ambulatory Visit: Payer: Self-pay | Admitting: Internal Medicine

## 2021-04-28 DIAGNOSIS — E041 Nontoxic single thyroid nodule: Secondary | ICD-10-CM | POA: Diagnosis not present

## 2021-04-28 DIAGNOSIS — J301 Allergic rhinitis due to pollen: Secondary | ICD-10-CM | POA: Diagnosis not present

## 2021-04-28 DIAGNOSIS — K219 Gastro-esophageal reflux disease without esophagitis: Secondary | ICD-10-CM | POA: Diagnosis not present

## 2021-04-28 DIAGNOSIS — J309 Allergic rhinitis, unspecified: Secondary | ICD-10-CM | POA: Diagnosis not present

## 2021-04-28 MED ORDER — AZITHROMYCIN 250 MG PO TABS: ORAL_TABLET | ORAL | 0 refills | Status: DC

## 2021-04-28 MED ORDER — DEXAMETHASONE 2 MG PO TABS: ORAL_TABLET | ORAL | 0 refills | Status: DC

## 2021-05-02 ENCOUNTER — Other Ambulatory Visit: Payer: Self-pay | Admitting: Otolaryngology

## 2021-05-02 DIAGNOSIS — E041 Nontoxic single thyroid nodule: Secondary | ICD-10-CM

## 2021-05-04 ENCOUNTER — Ambulatory Visit
Admission: RE | Admit: 2021-05-04 | Discharge: 2021-05-04 | Disposition: A | Discharge status: Home / Self Care | Payer: Medicare Other | Source: Ambulatory Visit | Attending: Otolaryngology | Admitting: Otolaryngology

## 2021-05-04 DIAGNOSIS — E041 Nontoxic single thyroid nodule: Secondary | ICD-10-CM | POA: Diagnosis not present

## 2021-05-05 DIAGNOSIS — J301 Allergic rhinitis due to pollen: Secondary | ICD-10-CM | POA: Diagnosis not present

## 2021-05-16 ENCOUNTER — Ambulatory Visit (INDEPENDENT_AMBULATORY_CARE_PROVIDER_SITE_OTHER): Payer: Medicare Other | Admitting: Dermatology

## 2021-05-16 ENCOUNTER — Other Ambulatory Visit: Payer: Self-pay

## 2021-05-16 DIAGNOSIS — L821 Other seborrheic keratosis: Secondary | ICD-10-CM

## 2021-05-16 DIAGNOSIS — L578 Other skin changes due to chronic exposure to nonionizing radiation: Secondary | ICD-10-CM | POA: Diagnosis not present

## 2021-05-16 DIAGNOSIS — L820 Inflamed seborrheic keratosis: Secondary | ICD-10-CM

## 2021-05-16 NOTE — Progress Notes (Signed)
   Follow-Up Visit   Subjective  [name] is a 70 y.o. female who presents for the following: ISK's  (Patient has irritated skin lesion on the hands and arms that she would like treated today), itching (On the back - patient would like her back checked to see if there is anything on her back that could be causing the itching sensation), and skin lesion (On the forehead - irregular, patient is concerned and would like lesion checked).  The following portions of the chart were reviewed this encounter and updated as appropriate:   Tobacco  Allergies  Meds  Problems  Med Hx  Surg Hx  Fam Hx      Review of Systems:  No other skin or systemic complaints except as noted in HPI or Assessment and Plan.  Objective  Well appearing patient in no apparent distress; mood and affect are within normal limits.  A focused examination was performed including the face, back, arms, and hands. Relevant physical exam findings are noted in the Assessment and Plan.  Forehead, hands, arms, back x 30 (30) Erythematous keratotic or waxy stuck-on papule or plaque.   Assessment & Plan  Inflamed seborrheic keratosis Forehead, hands, arms, back x 30  Destruction of lesion - Forehead, hands, arms, back x 30 Complexity: simple   Destruction method: cryotherapy   Informed consent: discussed and consent obtained   Timeout:  patient name, date of birth, surgical site, and procedure verified Lesion destroyed using liquid nitrogen: Yes   Region frozen until ice ball extended beyond lesion: Yes   Outcome: patient tolerated procedure well with no complications   Post-procedure details: wound care instructions given    Seborrheic Keratoses - Stuck-on, waxy, tan-brown papules and/or plaques  - Benign-appearing - Discussed benign etiology and prognosis. - Observe - Call for any changes  Actinic Damage - chronic, secondary to cumulative UV radiation exposure/sun exposure over time - diffuse scaly  erythematous macules with underlying dyspigmentation - Recommend daily broad spectrum sunscreen SPF 30+ to sun-exposed areas, reapply every 2 hours as needed.  - Recommend staying in the shade or wearing long sleeves, sun glasses (UVA+UVB protection) and wide brim hats (4-inch brim around the entire circumference of the hat). - Call for new or changing lesions.  Return in about 3 months (around 08/16/2021) for ISK follow up .  Luther Redo, CMA, am acting as scribe for Sarina Ser, MD .  Documentation: I have reviewed the above documentation for accuracy and completeness, and I agree with the above.  Sarina Ser, MD

## 2021-05-16 NOTE — Patient Instructions (Signed)

## 2021-05-17 ENCOUNTER — Other Ambulatory Visit: Payer: Medicare Other

## 2021-05-19 ENCOUNTER — Encounter: Payer: Self-pay | Admitting: Dermatology

## 2021-05-19 DIAGNOSIS — J301 Allergic rhinitis due to pollen: Secondary | ICD-10-CM | POA: Diagnosis not present

## 2021-05-26 DIAGNOSIS — J301 Allergic rhinitis due to pollen: Secondary | ICD-10-CM | POA: Diagnosis not present

## 2021-06-02 DIAGNOSIS — J301 Allergic rhinitis due to pollen: Secondary | ICD-10-CM | POA: Diagnosis not present

## 2021-06-06 DIAGNOSIS — K219 Gastro-esophageal reflux disease without esophagitis: Secondary | ICD-10-CM | POA: Diagnosis not present

## 2021-06-07 DIAGNOSIS — Z23 Encounter for immunization: Secondary | ICD-10-CM | POA: Diagnosis not present

## 2021-06-09 DIAGNOSIS — J301 Allergic rhinitis due to pollen: Secondary | ICD-10-CM | POA: Diagnosis not present

## 2021-06-10 DIAGNOSIS — J301 Allergic rhinitis due to pollen: Secondary | ICD-10-CM | POA: Diagnosis not present

## 2021-06-16 DIAGNOSIS — J301 Allergic rhinitis due to pollen: Secondary | ICD-10-CM | POA: Diagnosis not present

## 2021-06-20 DIAGNOSIS — H40033 Anatomical narrow angle, bilateral: Secondary | ICD-10-CM | POA: Diagnosis not present

## 2021-06-20 DIAGNOSIS — H43811 Vitreous degeneration, right eye: Secondary | ICD-10-CM | POA: Diagnosis not present

## 2021-06-20 DIAGNOSIS — H2513 Age-related nuclear cataract, bilateral: Secondary | ICD-10-CM | POA: Diagnosis not present

## 2021-06-20 DIAGNOSIS — H16223 Keratoconjunctivitis sicca, not specified as Sjogren's, bilateral: Secondary | ICD-10-CM | POA: Diagnosis not present

## 2021-06-23 DIAGNOSIS — J301 Allergic rhinitis due to pollen: Secondary | ICD-10-CM | POA: Diagnosis not present

## 2021-06-30 DIAGNOSIS — J301 Allergic rhinitis due to pollen: Secondary | ICD-10-CM | POA: Diagnosis not present

## 2021-07-04 DIAGNOSIS — H524 Presbyopia: Secondary | ICD-10-CM | POA: Diagnosis not present

## 2021-07-07 DIAGNOSIS — J301 Allergic rhinitis due to pollen: Secondary | ICD-10-CM | POA: Diagnosis not present

## 2021-07-11 ENCOUNTER — Ambulatory Visit: Payer: Medicare Other | Admitting: Adult Health Nurse Practitioner

## 2021-07-12 ENCOUNTER — Ambulatory Visit: Payer: Medicare Other | Admitting: Internal Medicine

## 2021-07-14 DIAGNOSIS — J301 Allergic rhinitis due to pollen: Secondary | ICD-10-CM | POA: Diagnosis not present

## 2021-07-21 DIAGNOSIS — J301 Allergic rhinitis due to pollen: Secondary | ICD-10-CM | POA: Diagnosis not present

## 2021-07-21 NOTE — Progress Notes (Signed)
MEDICARE ANNUAL WELLNESS VISIT AND FOLLOW UP  Assessment:    Diagnoses and all orders for this visit:  Encounter for Medicare annual wellness exam Due 1 year  Hypertension, benign essential, goal below 140/90 Continue medication Monitor blood pressure at home; call if consistently over 130/80 Continue DASH diet.   Reminder to go to the ER if any CP, SOB, nausea, dizziness, severe HA, changes vision/speech, left arm numbness and tingling and jaw pain. CBC, CMP  Fatty liver (2005 by [place]) Maintain healthy weight, avoid alcohol/tylenol, will monitor LFTs  Gout of big toe Continue allopurinol Diet discussed Check uric acid as needed  Prediabetes Discussed disease and risks Discussed diet/exercise, weight management  A1C q6m due  Mixed hyperlipidemia Continue medications Continue low cholesterol diet and exercise.  Check lipid panel.  TSH  Bmi -23 Continue diet and exercise  Anxiety Rare use of xanax  Stress management techniques discussed, increase water, good sleep hygiene discussed, increase exercise, and increase veggies.   Environmental allergies Continue OTC allergy pills Continue to follow with Dr. RConstance Holster DCIS of L breast Follows with oncology, q661mollow up S/p lumpectomy, completed radiation, intolerant of tamoxifen  Last mammogram 12/21 negative  Over 30 minutes of exam, counseling, chart review, and critical decision making was performed  Future Appointments  Date Time Provider DeFelicity9/14/2022  8:30 AM KoRalene BatheMD [facility] None  01/12/2022 11:00 AM McUnk PintoMD [facility] None  01/18/2022  8:30 AM GuNicholas LoseMD [facility] None  02/13/2022  9:00 AM [facility]    Plan:   During the course of the visit the patient was educated and counseled about appropriate screening and preventive services including:   Pneumococcal vaccine  Influenza vaccine Td vaccine Prevnar 13 Screening  electrocardiogram Screening mammography Bone densitometry screening Colorectal cancer screening Diabetes screening Glaucoma screening Nutrition counseling  Advanced directives: given info/requested copies   Subjective:   [name] a [age].o. female who presents for annual medicare visit and follow on on chronic medical conditions.    She is followed by ortho Dr. EvAmalia Haileyor her feet, [facility].   She has sinus and allergy issues, getting injections by allergist Dr. RoConstance Holster Was questioning GERD and put on Omeprazole x 1 month as well as no coffee/chocolate but has noticed no changes in cough, post nasal drip. Has follow up appointment this week.  She was diagnosed with DCIS Lt breast on 11/13/2019. Underwent L breast Lumpectomy on Dec 05, 2019 by Dr NeLucia GaskinsWas on tamoxifen by Dr. GuLindi Adieut couldn't tolerate and was d/c'd, close monitoring q6m12mternating with surgeon, and completed adjuvent radiation treatments by Dr. ChrBaruch Goutyammogram 10/2020 was benign repeat 1 year.   She has hx of anxiety, currently prescribed xanax, takes occasionally.   BMI is Body mass index is 24.09 kg/m., she is working on diet and exercise. Wt Readings from Last 3 Encounters:  07/25/21 153 lb 12.8 oz (69.8 kg)  02/07/21 154 lb (69.9 kg)  01/18/21 156 lb 3.2 oz (70.9 kg)   Her blood pressure has been controlled at home, today their BP is BP: 104/62 BP Readings from Last 3 Encounters:  07/25/21 104/62  02/07/21 126/72  01/18/21 129/64   She does workout. She denies chest pain, shortness of breath, dizziness.   She is on cholesterol medication (pravastatin 20 mg three days a week, fenofibrate) and denies myalgias. Her cholesterol is not at goal.  The cholesterol last visit was:   Lab Results  Component Value Date   CHOL 145 01/04/2021   HDL 49 (L) 01/04/2021   LDLCALC 76 01/04/2021   TRIG 121 01/04/2021   CHOLHDL 3.0 01/04/2021   She has been  working on diet and exercise for prediabetes, and denies foot ulcerations, hyperglycemia, hypoglycemia , increased appetite, nausea, paresthesia of the feet, polydipsia, polyuria, visual disturbances, vomiting and weight loss. Last A1C in the office was:  Lab Results  Component Value Date   HGBA1C 5.6 01/04/2021     Last GFR Lab Results  Component Value Date   GFRNONAA 48 (L) 01/04/2021   Patient is on Vitamin D supplement. Lab Results  Component Value Date   VD25OH 84 01/04/2021     Patient is on allopurinol for gout and does not report a recent flare.  Lab Results  Component Value Date   LABURIC 5.3 01/04/2021   LFTs monitored closely due to hx of fatty liver per [place] in 2005, improved with recent weight loss.  Lab Results  Component Value Date   ALT 17 01/04/2021   AST 21 01/04/2021   ALKPHOS 76 11/19/2019   BILITOT 0.6 01/04/2021     Medication Review Current Outpatient Medications on File Prior to Visit  Medication Sig Dispense Refill   allopurinol (ZYLOPRIM) 300 MG tablet TAKE 1 TABLET DAILY FOR GOUT PREVENTION 90 tablet 1   Ascorbic Acid (VITAMIN C) 1000 MG tablet Take 1,000 mg by mouth daily.     aspirin EC 81 MG tablet Take 1 tablet (81 mg total) by mouth daily.     Cholecalciferol (VITAMIN D PO) Take 10,000 Units by mouth daily.     Cyanocobalamin (VITAMIN B-12 PO) Take 1 tablet by mouth daily.     EPINEPHrine 0.3 mg/0.3 mL IJ SOAJ injection INJECT 1 PEN IN THE MUSCLE ONE TIME AS DIRECTED     fenofibrate (TRICOR) 145 MG tablet TAKE 1 TABLET BY MOUTH ONCE DAILY FOR  TRIGLYCERIDES  (  BLOOD  FATS) 90 tablet 0   fluticasone (FLONASE) 50 MCG/ACT nasal spray Use 1 to 2 sprays each Nares 1 to 2 x /day 48 g 3   loratadine (CLARITIN) 10 MG tablet Take 10 mg by mouth daily.     phentermine (ADIPEX-P) 37.5 MG tablet Take    1 tablet     every Morning     for Dieting & Weight Loss 90 tablet 1   pravastatin (PRAVACHOL) 20 MG tablet TAKE 1 TABLET AT BEDTIME FOR CHOLESTEROL 90  tablet 2   pyridOXINE (VITAMIN B-6) 100 MG tablet Take 100 mg by mouth daily.     topiramate (TOPAMAX) 50 MG tablet TAKE 2 TO 3 TABLETS BY MOUTH DAILY FOR DIETING AND WEIGHT LOSS 270 tablet 1   triamterene-hydrochlorothiazide (MAXZIDE-25) 37.5-25 MG tablet Take 1/2 to 1 tablet Daily for BP & Fluid Retention 90 tablet 3   zinc gluconate 50 MG tablet Take 50 mg by mouth daily.     azithromycin (ZITHROMAX) 250 MG tablet Take 2 tablets with Food on  Day 1, then 1 tablet Daily with Food for Sinusitis / Bronchitis (Patient taking differently: Take 2 tablets with Food on  Day 1, then 1 tablet Daily with Food for Sinusitis / Bronchitis) 6 each 0   dexamethasone (DECADRON) 2 MG tablet Take 1 tab 3 x /day for 2 days, then 2 x /day for 2  days,then 1 tab daily (Patient taking differently: Take 1 tab 3 x /day  for 2 days, then 2 x /day for 2  days,then 1 tab daily) 13 tablet 0   ipratropium (ATROVENT) 0.03 % nasal spray SMARTSIG:2 Spray(s) Both Nares 3-4 Times Daily (Patient not taking: No sig reported)     loratadine (CLARITIN) 10 MG tablet Take 1 tablet (10 mg total) by mouth daily. 90 tablet 1   No current facility-administered medications on file prior to visit.    Allergies: No Known Allergies  Current Problems (verified) has Hypertension, benign essential, goal below 140/90; Hyperlipidemia; Environmental allergies; Anxiety; History of Bell's palsy; Prediabetes; BMI 23.0-23.9, adult; Gout of big toe; Fatty liver (2005 by U/S); Family history of cardiac disorder in mother; and Ductal carcinoma in situ (DCIS) of left breast on their problem list.  Screening Tests Immunization History  Administered Date(s) Administered   Influenza,inj,quad, With Preservative 08/27/2017   Influenza-Unspecified 10/02/2018, 10/27/2019   PFIZER Comirnaty(Gray Top)Covid-19 Tri-Sucrose Vaccine 09/05/2020, 06/07/2021   PFIZER(Purple Top)SARS-COV-2 Vaccination 01/03/2020, 01/24/2020   PPD Test 05/14/2014   Pneumococcal  Polysaccharide-23 02/08/2017    Preventative care: Last colonoscopy: Mar 27 2016, Dr. Watt Climes scheduled for preop 07/2021 Last mammogram:  11/16/20 benign repeat 1 year Last pap smear/pelvic exam: Hysterctomy, 2014 DEXA: - discussed with oncologist to schedule with mammogram, declines today   Prior vaccinations: TD or Tdap: 2011 declines  Influenza: Declined, reaction  Pneumococcal:2018 Prevnar13: declines today  Shingles/Zostavax: 2014 Shingrix: 4 years ago 2 vaccines Covid 19: 2/2, 2021, 09/05/20, 06/07/21 pfizer   Names of Other Physician/Practitioners you currently use: 1. [facility]- here for primary care 2. [employer], last visit 2022 3. Dr. Imagene Riches,  dentist, last visit 2022  Patient Care Team: Unk Pinto, MD as PCP - General (Internal Medicine) Avon Gully, NP as Nurse Practitioner (Obstetrics and Gynecology) Clarene Essex, MD as Consulting Physician (Gastroenterology) Suella Broad, MD as Consulting Physician (Physical Medicine and Rehabilitation) Jari Pigg, MD as Consulting Physician (Dermatology) Nicholas Lose, MD as Consulting Physician (Hematology and Oncology) Alphonsa Overall, MD as Consulting Physician (General Surgery) Noreene Filbert, MD as Referring Physician (Radiation Oncology)  Surgical: She  has a past surgical history that includes Abdominal hysterectomy (1990); Tonsillectomy; Tendon repair; basal cell carcinoma (2011); and Breast lumpectomy with radioactive seed localization (Left, 12/05/2019). Family Her family history includes Arthritis in her mother; Cancer in her brother and father; Heart attack in her mother; Heart disease (age of onset: 65) in her mother; Hyperlipidemia in her mother; Hypertension in her brother and mother; Parkinson's disease in her brother; Stroke in her mother. Social history  She reports that she has never smoked. She has never used smokeless tobacco. She reports current  alcohol use. She reports that she does not use drugs.  MEDICARE WELLNESS OBJECTIVES: Physical activity: Current Exercise Habits: Home exercise routine, Type of exercise: walking, Time (Minutes): 35, Frequency (Times/Week): 4, Weekly Exercise (Minutes/Week): 140, Intensity: Mild, Exercise limited by: None identified Cardiac risk factors: Cardiac Risk Factors include: dyslipidemia;advanced age (>39mn, >>28women);hypertension Depression/mood screen:   [facility] 07/25/2021  Decreased Interest 0  Down, Depressed, Hopeless 0  PHQ - 2 Score 0  Some recent data might be hidden    ADLs:  In your present state of health, do you have any difficulty performing the following activities: 07/25/2021  Hearing? N  Vision? N  Difficulty concentrating or making decisions? N  Walking or climbing stairs? N  Dressing or bathing? N  Doing errands, shopping? N  Some recent data might be hidden  Cognitive Testing  Alert? Yes  Normal Appearance?Yes  Oriented to person? Yes  Place? Yes   Time? Yes  Recall of three objects?  Yes  Can perform simple calculations? Yes  Displays appropriate judgment?Yes  Can read the correct time from a watch face?Yes  EOL planning: Does Patient Have a Medical Advance Directive?: Yes Type of Advance Directive: Healthcare Power of Attorney, Living will Does patient want to make changes to medical advance directive?: No - Patient declined Copy of [place] in Chart?: Yes - validated most recent copy scanned in chart (See row information)   Objective:   Today's Vitals   07/25/21 1029  BP: 104/62  Pulse: 77  Temp: 97.7 F (36.5 C)  SpO2: 98%  Weight: 153 lb 12.8 oz (69.8 kg)    Wt Readings from Last 3 Encounters:  07/25/21 153 lb 12.8 oz (69.8 kg)  02/07/21 154 lb (69.9 kg)  01/18/21 156 lb 3.2 oz (70.9 kg)    Body mass index is 24.09 kg/m.  General appearance: alert, no distress, WD/WN,  female HEENT: normocephalic,  sclerae anicteric, TMs pearly, nares patent, no discharge or erythema, pharynx normal Oral cavity: MMM, no lesions Neck: supple, no lymphadenopathy, no thyromegaly, no masses Heart: RRR, normal S1, S2, no murmurs Lungs: CTA bilaterally, no wheezes, rhonchi, or rales Abdomen: +bs, soft, non tender, non distended, no masses, no hepatomegaly, no splenomegaly Musculoskeletal: nontender, no swelling, no obvious deformity Extremities: no edema, no cyanosis, no clubbing Pulses: 2+ symmetric, upper and lower extremities, normal cap refill Neurological: alert, oriented x 3, CN2-12 intact, strength normal upper extremities and lower extremities, sensation normal throughout, DTRs 2+ throughout, no cerebellar signs, gait normal Psychiatric: normal affect, behavior normal, pleasant  Breast: defer Gyn: defer Rectal: defer   Medicare Attestation I have personally reviewed: The patient's medical and social history Their use of alcohol, tobacco or illicit drugs Their current medications and supplements The patient's functional ability including ADLs,fall risks, home safety risks, cognitive, and hearing and visual impairment Diet and physical activities Evidence for depression or mood disorders  The patient's weight, height, BMI, and visual acuity have been recorded in the chart.  I have made referrals, counseling, and provided education to the patient based on review of the above and I have provided the patient with a written personalized care plan for preventive services.     Magda Bernheim ANP-C  [facility]  07/25/2021

## 2021-07-25 ENCOUNTER — Encounter: Payer: Self-pay | Admitting: Nurse Practitioner

## 2021-07-25 ENCOUNTER — Ambulatory Visit (INDEPENDENT_AMBULATORY_CARE_PROVIDER_SITE_OTHER): Payer: Medicare Other | Admitting: Nurse Practitioner

## 2021-07-25 ENCOUNTER — Other Ambulatory Visit: Payer: Self-pay

## 2021-07-25 VITALS — BP 104/62 | HR 77 | Temp 97.7°F | Wt 153.8 lb

## 2021-07-25 DIAGNOSIS — E782 Mixed hyperlipidemia: Secondary | ICD-10-CM | POA: Diagnosis not present

## 2021-07-25 DIAGNOSIS — M1A9XX0 Chronic gout, unspecified, without tophus (tophi): Secondary | ICD-10-CM | POA: Diagnosis not present

## 2021-07-25 DIAGNOSIS — R6889 Other general symptoms and signs: Secondary | ICD-10-CM

## 2021-07-25 DIAGNOSIS — I10 Essential (primary) hypertension: Secondary | ICD-10-CM

## 2021-07-25 DIAGNOSIS — F419 Anxiety disorder, unspecified: Secondary | ICD-10-CM

## 2021-07-25 DIAGNOSIS — R7309 Other abnormal glucose: Secondary | ICD-10-CM | POA: Diagnosis not present

## 2021-07-25 DIAGNOSIS — E559 Vitamin D deficiency, unspecified: Secondary | ICD-10-CM

## 2021-07-25 DIAGNOSIS — Z0001 Encounter for general adult medical examination with abnormal findings: Secondary | ICD-10-CM | POA: Diagnosis not present

## 2021-07-25 DIAGNOSIS — K760 Fatty (change of) liver, not elsewhere classified: Secondary | ICD-10-CM

## 2021-07-25 DIAGNOSIS — Z0000 Encounter for general adult medical examination without abnormal findings: Secondary | ICD-10-CM

## 2021-07-25 NOTE — Patient Instructions (Signed)

## 2021-07-26 LAB — CBC WITH DIFFERENTIAL/PLATELET
Absolute Monocytes: 323 cells/uL (ref 200–950)
Basophils Absolute: 49 cells/uL (ref 0–200)
Basophils Relative: 1 %
Eosinophils Absolute: 152 cells/uL (ref 15–500)
Eosinophils Relative: 3.1 %
HCT: 42.4 % (ref 35.0–45.0)
Hemoglobin: 14.4 g/dL (ref 11.7–15.5)
Lymphs Abs: 1823 cells/uL (ref 850–3900)
MCH: 29.4 pg (ref 27.0–33.0)
MCHC: 34 g/dL (ref 32.0–36.0)
MCV: 86.7 fL (ref 80.0–100.0)
MPV: 8.9 fL (ref 7.5–12.5)
Monocytes Relative: 6.6 %
Neutro Abs: 2553 cells/uL (ref 1500–7800)
Neutrophils Relative %: 52.1 %
Platelets: 203 10*3/uL (ref 140–400)
RBC: 4.89 10*6/uL (ref 3.80–5.10)
RDW: 12.8 % (ref 11.0–15.0)
Total Lymphocyte: 37.2 %
WBC: 4.9 10*3/uL (ref 3.8–10.8)

## 2021-07-26 LAB — HEMOGLOBIN A1C
Hgb A1c MFr Bld: 5.6 % of total Hgb (ref <5.7)
Mean Plasma Glucose: 114 mg/dL
eAG (mmol/L): 6.3 mmol/L

## 2021-07-26 LAB — COMPLETE METABOLIC PANEL WITH GFR
AG Ratio: 1.8 (calc) (ref 1.0–2.5)
ALT: 16 U/L (ref 6–29)
AST: 20 U/L (ref 10–35)
Albumin: 4.7 g/dL (ref 3.6–5.1)
Alkaline phosphatase (APISO): 82 U/L (ref 37–153)
BUN/Creatinine Ratio: 27 (calc) — ABNORMAL HIGH (ref 6–22)
BUN: 27 mg/dL — ABNORMAL HIGH (ref 7–25)
CO2: 31 mmol/L (ref 20–32)
Calcium: 10.1 mg/dL (ref 8.6–10.4)
Chloride: 101 mmol/L (ref 98–110)
Creat: 0.99 mg/dL (ref 0.60–1.00)
Globulin: 2.6 g/dL (calc) (ref 1.9–3.7)
Glucose, Bld: 90 mg/dL (ref 65–99)
Potassium: 3.7 mmol/L (ref 3.5–5.3)
Sodium: 139 mmol/L (ref 135–146)
Total Bilirubin: 0.6 mg/dL (ref 0.2–1.2)
Total Protein: 7.3 g/dL (ref 6.1–8.1)
eGFR: 61 mL/min/{1.73_m2} (ref >=60)

## 2021-07-26 LAB — LIPID PANEL
Cholesterol: 138 mg/dL (ref <200)
HDL: 44 mg/dL — ABNORMAL LOW (ref >=50)
LDL Cholesterol (Calc): 74 mg/dL (calc)
Non-HDL Cholesterol (Calc): 94 mg/dL (calc) (ref <130)
Total CHOL/HDL Ratio: 3.1 (calc) (ref <5.0)
Triglycerides: 123 mg/dL (ref <150)

## 2021-07-26 LAB — TSH: TSH: 1.02 mIU/L (ref 0.40–4.50)

## 2021-08-04 DIAGNOSIS — K219 Gastro-esophageal reflux disease without esophagitis: Secondary | ICD-10-CM | POA: Diagnosis not present

## 2021-08-04 DIAGNOSIS — J301 Allergic rhinitis due to pollen: Secondary | ICD-10-CM | POA: Diagnosis not present

## 2021-08-04 DIAGNOSIS — R519 Headache, unspecified: Secondary | ICD-10-CM | POA: Diagnosis not present

## 2021-08-08 DIAGNOSIS — J342 Deviated nasal septum: Secondary | ICD-10-CM | POA: Diagnosis not present

## 2021-08-10 ENCOUNTER — Other Ambulatory Visit: Payer: Self-pay

## 2021-08-10 ENCOUNTER — Ambulatory Visit (INDEPENDENT_AMBULATORY_CARE_PROVIDER_SITE_OTHER): Payer: Medicare Other | Admitting: Dermatology

## 2021-08-10 DIAGNOSIS — L650 Telogen effluvium: Secondary | ICD-10-CM

## 2021-08-10 DIAGNOSIS — L820 Inflamed seborrheic keratosis: Secondary | ICD-10-CM | POA: Diagnosis not present

## 2021-08-10 DIAGNOSIS — L570 Actinic keratosis: Secondary | ICD-10-CM

## 2021-08-10 NOTE — Patient Instructions (Signed)

## 2021-08-10 NOTE — Progress Notes (Signed)
Follow-Up Visit   Subjective  [name] is a 70 y.o. female who presents for the following: Follow-up (F/u for ISKs from last visit treated with Ln2 to recheck. Pt states that she has a new spot on her neck to look at today. ). She has been improving with her hair loss with red light treatment.  The following portions of the chart were reviewed this encounter and updated as appropriate:  Tobacco  Allergies  Meds  Problems  Med Hx  Surg Hx  Fam Hx     Review of Systems: No other skin or systemic complaints except as noted in HPI or Assessment and Plan.  Objective  Well appearing patient in no apparent distress; mood and affect are within normal limits.  A focused examination was performed including scalp, face, hands, neck. Relevant physical exam findings are noted in the Assessment and Plan.  chest x 1 Erythematous thin papules/macules with gritty scale.   hands, face, neck x 34 (34) Erythematous keratotic or waxy stuck-on papule or plaque.   Scalp Diffuse thinning of hair  Assessment & Plan  AK (actinic keratosis) chest x 1  Actinic keratoses are precancerous spots that appear secondary to cumulative UV radiation exposure/sun exposure over time. They are chronic with expected duration over 1 year. A portion of actinic keratoses will progress to squamous cell carcinoma of the skin. It is not possible to reliably predict which spots will progress to skin cancer and so treatment is recommended to prevent development of skin cancer.  Recommend daily broad spectrum sunscreen SPF 30+ to sun-exposed areas, reapply every 2 hours as needed.  Recommend staying in the shade or wearing long sleeves, sun glasses (UVA+UVB protection) and wide brim hats (4-inch brim around the entire circumference of the hat). Call for new or changing lesions.  Prior to procedure, discussed risks of blister formation, small wound, skin dyspigmentation, or rare scar following cryotherapy. Recommend  Vaseline ointment to treated areas while healing.  Destruction of lesion - chest x 1 Complexity: simple   Destruction method: cryotherapy   Informed consent: discussed and consent obtained   Timeout:  patient name, date of birth, surgical site, and procedure verified Lesion destroyed using liquid nitrogen: Yes   Region frozen until ice ball extended beyond lesion: Yes   Outcome: patient tolerated procedure well with no complications   Post-procedure details: wound care instructions given    Inflamed seborrheic keratosis hands, face, neck x 34  Prior to procedure, discussed risks of blister formation, small wound, skin dyspigmentation, or rare scar following cryotherapy. Recommend Vaseline ointment to treated areas while healing.  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  Chronic; persistent; improving.  Not to goal. Continue current regimen for continue hair growth with red light treatment. Telogen effluvium is a benign, self limited condition causing increased hair shedding usually for several months. It does not progress to baldness. It can be triggered by recent illness, recent surgery, thyroid disease, low iron stores, vitamin D deficiency, fad diets or rapid weight  loss, hormonal changes such as pregnancy or birth control pills, and some medication. Usually the hair loss starts 2-3 months after the illness or health change. Rarely, it can continue for longer than a year.  Return in about 12 weeks  (around 11/02/2021) for 10-12 week ISK f/u.  IHarriett Sine, CMA, am acting as scribe for Sarina Ser, MD.  Documentation: I have reviewed the above documentation for accuracy and completeness, and I agree with the above.  Sarina Ser, MD

## 2021-08-11 DIAGNOSIS — J301 Allergic rhinitis due to pollen: Secondary | ICD-10-CM | POA: Diagnosis not present

## 2021-08-12 ENCOUNTER — Other Ambulatory Visit: Payer: Self-pay | Admitting: Otolaryngology

## 2021-08-12 ENCOUNTER — Encounter: Payer: Self-pay | Admitting: Dermatology

## 2021-08-12 DIAGNOSIS — K219 Gastro-esophageal reflux disease without esophagitis: Secondary | ICD-10-CM

## 2021-08-18 DIAGNOSIS — J301 Allergic rhinitis due to pollen: Secondary | ICD-10-CM | POA: Diagnosis not present

## 2021-08-22 ENCOUNTER — Other Ambulatory Visit: Payer: Self-pay

## 2021-08-22 ENCOUNTER — Ambulatory Visit
Admission: RE | Admit: 2021-08-22 | Discharge: 2021-08-22 | Disposition: A | Discharge status: Home / Self Care | Payer: Medicare Other | Source: Ambulatory Visit | Attending: Otolaryngology | Admitting: Otolaryngology

## 2021-08-22 DIAGNOSIS — K219 Gastro-esophageal reflux disease without esophagitis: Secondary | ICD-10-CM | POA: Insufficient documentation

## 2021-08-24 ENCOUNTER — Other Ambulatory Visit: Payer: Self-pay | Admitting: Otolaryngology

## 2021-08-24 ENCOUNTER — Other Ambulatory Visit: Payer: Self-pay | Admitting: Internal Medicine

## 2021-08-24 DIAGNOSIS — Z79899 Other long term (current) drug therapy: Secondary | ICD-10-CM

## 2021-08-24 DIAGNOSIS — K219 Gastro-esophageal reflux disease without esophagitis: Secondary | ICD-10-CM

## 2021-08-24 MED ORDER — PHENTERMINE HCL 37.5 MG PO TABS: ORAL_TABLET | ORAL | 1 refills | Status: DC

## 2021-08-25 DIAGNOSIS — J301 Allergic rhinitis due to pollen: Secondary | ICD-10-CM | POA: Diagnosis not present

## 2021-08-26 DIAGNOSIS — Z23 Encounter for immunization: Secondary | ICD-10-CM | POA: Diagnosis not present

## 2021-09-01 DIAGNOSIS — J301 Allergic rhinitis due to pollen: Secondary | ICD-10-CM | POA: Diagnosis not present

## 2021-09-02 DIAGNOSIS — J301 Allergic rhinitis due to pollen: Secondary | ICD-10-CM | POA: Diagnosis not present

## 2021-09-08 DIAGNOSIS — J301 Allergic rhinitis due to pollen: Secondary | ICD-10-CM | POA: Diagnosis not present

## 2021-09-15 DIAGNOSIS — J301 Allergic rhinitis due to pollen: Secondary | ICD-10-CM | POA: Diagnosis not present

## 2021-09-16 DIAGNOSIS — K2100 Gastro-esophageal reflux disease with esophagitis, without bleeding: Secondary | ICD-10-CM | POA: Diagnosis not present

## 2021-09-19 ENCOUNTER — Other Ambulatory Visit: Payer: Self-pay | Admitting: Adult Health

## 2021-09-19 DIAGNOSIS — E782 Mixed hyperlipidemia: Secondary | ICD-10-CM

## 2021-09-21 DIAGNOSIS — K2100 Gastro-esophageal reflux disease with esophagitis, without bleeding: Secondary | ICD-10-CM | POA: Diagnosis not present

## 2021-09-21 DIAGNOSIS — K449 Diaphragmatic hernia without obstruction or gangrene: Secondary | ICD-10-CM | POA: Diagnosis not present

## 2021-09-29 DIAGNOSIS — J301 Allergic rhinitis due to pollen: Secondary | ICD-10-CM | POA: Diagnosis not present

## 2021-10-06 DIAGNOSIS — J301 Allergic rhinitis due to pollen: Secondary | ICD-10-CM | POA: Diagnosis not present

## 2021-10-13 DIAGNOSIS — J301 Allergic rhinitis due to pollen: Secondary | ICD-10-CM | POA: Diagnosis not present

## 2021-10-24 ENCOUNTER — Ambulatory Visit: Payer: Medicare Other | Admitting: Neurology

## 2021-10-25 ENCOUNTER — Ambulatory Visit: Payer: Medicare Other | Admitting: Dermatology

## 2021-10-26 ENCOUNTER — Encounter: Payer: Self-pay | Admitting: Internal Medicine

## 2021-10-26 ENCOUNTER — Other Ambulatory Visit: Payer: Self-pay | Admitting: Internal Medicine

## 2021-10-26 MED ORDER — AMOXICILLIN 250 MG PO CAPS: ORAL_CAPSULE | ORAL | 0 refills | Status: DC

## 2021-10-27 DIAGNOSIS — J301 Allergic rhinitis due to pollen: Secondary | ICD-10-CM | POA: Diagnosis not present

## 2021-11-03 DIAGNOSIS — J301 Allergic rhinitis due to pollen: Secondary | ICD-10-CM | POA: Diagnosis not present

## 2021-11-10 DIAGNOSIS — J309 Allergic rhinitis, unspecified: Secondary | ICD-10-CM | POA: Diagnosis not present

## 2021-11-10 DIAGNOSIS — J301 Allergic rhinitis due to pollen: Secondary | ICD-10-CM | POA: Diagnosis not present

## 2021-11-17 DIAGNOSIS — J301 Allergic rhinitis due to pollen: Secondary | ICD-10-CM | POA: Diagnosis not present

## 2021-11-22 DIAGNOSIS — Z853 Personal history of malignant neoplasm of breast: Secondary | ICD-10-CM | POA: Diagnosis not present

## 2021-11-22 LAB — HM MAMMOGRAPHY

## 2021-11-24 ENCOUNTER — Telehealth: Payer: Self-pay | Admitting: *Deleted

## 2021-11-24 DIAGNOSIS — J301 Allergic rhinitis due to pollen: Secondary | ICD-10-CM | POA: Diagnosis not present

## 2021-11-24 NOTE — Telephone Encounter (Signed)
This RN returned call per pt's VM requesting a call - no other information left. This RN obtained the pt's identified VM- this RN left message call was being returned. Request left to call again.

## 2021-11-29 ENCOUNTER — Other Ambulatory Visit: Payer: Self-pay

## 2021-11-29 ENCOUNTER — Encounter: Payer: Self-pay | Admitting: Dermatology

## 2021-11-29 ENCOUNTER — Other Ambulatory Visit: Payer: Self-pay | Admitting: *Deleted

## 2021-11-29 ENCOUNTER — Ambulatory Visit (INDEPENDENT_AMBULATORY_CARE_PROVIDER_SITE_OTHER): Payer: Medicare Other | Admitting: Dermatology

## 2021-11-29 DIAGNOSIS — L821 Other seborrheic keratosis: Secondary | ICD-10-CM

## 2021-11-29 DIAGNOSIS — L988 Other specified disorders of the skin and subcutaneous tissue: Secondary | ICD-10-CM | POA: Diagnosis not present

## 2021-11-29 DIAGNOSIS — D225 Melanocytic nevi of trunk: Secondary | ICD-10-CM

## 2021-11-29 DIAGNOSIS — L820 Inflamed seborrheic keratosis: Secondary | ICD-10-CM

## 2021-11-29 DIAGNOSIS — L578 Other skin changes due to chronic exposure to nonionizing radiation: Secondary | ICD-10-CM | POA: Diagnosis not present

## 2021-11-29 DIAGNOSIS — D485 Neoplasm of uncertain behavior of skin: Secondary | ICD-10-CM

## 2021-11-29 DIAGNOSIS — D0512 Intraductal carcinoma in situ of left breast: Secondary | ICD-10-CM

## 2021-11-29 NOTE — Progress Notes (Signed)
Follow-Up Visit   Subjective  [name] is a 71 y.o. female who presents for the following: Irritated skin lesions (On the arms, hands and R mid back - itchy, irritated, patient would like removed today.) and Facial Elastosis (Patient would like to discuss treatment options.). The patient has spots, moles and lesions to be evaluated, some may be new or changing and the patient has concerns that these could be cancer.  The following portions of the chart were reviewed this encounter and updated as appropriate:   Tobacco   Allergies   Meds   Problems   Med Hx   Surg Hx   Fam Hx      Review of Systems:  No other skin or systemic complaints except as noted in HPI or Assessment and Plan.  Objective  Well appearing patient in no apparent distress; mood and affect are within normal limits.  A focused examination was performed including the hands, arms, and back. Relevant physical exam findings are noted in the Assessment and Plan.  R mid back 0.6 cm flesh colored papule.   B/L hands and arms x >30 (30) Erythematous stuck-on, waxy papule or plaque  Face Rhytides and volume loss.    Assessment & Plan  Neoplasm of uncertain behavior of skin R mid back  Epidermal / dermal shaving  Lesion diameter (cm):  0.6 Informed consent: discussed and consent obtained   Timeout: patient name, date of birth, surgical site, and procedure verified   Procedure prep:  Patient was prepped and draped in usual sterile fashion Prep type:  Isopropyl alcohol Anesthesia: the lesion was anesthetized in a standard fashion   Anesthetic:  1% lidocaine w/ epinephrine 1-100,000 buffered w/ 8.4% NaHCO3 Instrument used: flexible razor blade   Hemostasis achieved with: pressure, aluminum chloride and electrodesiccation   Outcome: patient tolerated procedure well   Post-procedure details: sterile dressing applied and wound care instructions given   Dressing type: bandage and petrolatum    Specimen 1 -  Surgical pathology Differential Diagnosis: D48.5 irritated nevus r/o dysplasia  Check Margins: No  Inflamed seborrheic keratosis (30) B/L hands and arms x >30  Destruction of lesion - B/L hands and arms x >30 Complexity: simple   Destruction method: cryotherapy   Informed consent: discussed and consent obtained   Timeout:  patient name, date of birth, surgical site, and procedure verified Lesion destroyed using liquid nitrogen: Yes   Region frozen until ice ball extended beyond lesion: Yes   Outcome: patient tolerated procedure well with no complications   Post-procedure details: wound care instructions given    Elastosis of skin Face  Discussed mid face (2 syringes of Voluma) and oral commissure fillers (start with 1 syringe of Restylane Defyne) as well as blepharoplasty for the upper eyelids.  Seborrheic Keratoses - Stuck-on, waxy, tan-brown papules and/or plaques  - Benign-appearing - Discussed benign etiology and prognosis. - Observe - Call for any changes  Actinic Damage - chronic, secondary to cumulative UV radiation exposure/sun exposure over time - diffuse scaly erythematous macules with underlying dyspigmentation - Recommend daily broad spectrum sunscreen SPF 30+ to sun-exposed areas, reapply every 2 hours as needed.  - Recommend staying in the shade or wearing long sleeves, sun glasses (UVA+UVB protection) and wide brim hats (4-inch brim around the entire circumference of the hat). - Call for new or changing lesions.  Return in about 3 months (around 02/27/2022) for ISK f/u .  Luther Redo, CMA, am acting as scribe for Sarina Ser,  MD . Documentation: I have reviewed the above documentation for accuracy and completeness, and I agree with the above.  Sarina Ser, MD

## 2021-11-29 NOTE — Patient Instructions (Signed)

## 2021-11-30 ENCOUNTER — Telehealth: Payer: Self-pay

## 2021-11-30 DIAGNOSIS — J301 Allergic rhinitis due to pollen: Secondary | ICD-10-CM | POA: Diagnosis not present

## 2021-11-30 NOTE — Telephone Encounter (Signed)
Patient informed of pathology results 

## 2021-11-30 NOTE — Telephone Encounter (Signed)
-----   Message from Ralene Bathe, MD sent at 11/30/2021  4:54 PM EST ----- Diagnosis Skin , right mid back MELANOCYTIC NEVUS, INTRADERMAL TYPE, BASE INVOLVED  Benign mole No further treatment needed

## 2021-12-01 ENCOUNTER — Encounter: Payer: Self-pay | Admitting: *Deleted

## 2021-12-01 DIAGNOSIS — J301 Allergic rhinitis due to pollen: Secondary | ICD-10-CM | POA: Diagnosis not present

## 2021-12-01 NOTE — Progress Notes (Signed)
Per pt request RN successfully faxed mammogram order to [facility].

## 2021-12-07 DIAGNOSIS — M25561 Pain in right knee: Secondary | ICD-10-CM | POA: Diagnosis not present

## 2021-12-07 DIAGNOSIS — M25512 Pain in left shoulder: Secondary | ICD-10-CM | POA: Diagnosis not present

## 2021-12-08 DIAGNOSIS — J301 Allergic rhinitis due to pollen: Secondary | ICD-10-CM | POA: Diagnosis not present

## 2021-12-09 ENCOUNTER — Encounter: Payer: Self-pay | Admitting: Internal Medicine

## 2021-12-09 ENCOUNTER — Encounter: Payer: Self-pay | Admitting: Adult Health

## 2021-12-09 ENCOUNTER — Other Ambulatory Visit: Payer: Self-pay

## 2021-12-09 ENCOUNTER — Ambulatory Visit (INDEPENDENT_AMBULATORY_CARE_PROVIDER_SITE_OTHER): Payer: Medicare Other | Admitting: Adult Health

## 2021-12-09 VITALS — BP 150/90 | HR 99 | Temp 97.7°F | Wt 155.0 lb

## 2021-12-09 DIAGNOSIS — G4701 Insomnia due to medical condition: Secondary | ICD-10-CM

## 2021-12-09 DIAGNOSIS — J0101 Acute recurrent maxillary sinusitis: Secondary | ICD-10-CM

## 2021-12-09 MED ORDER — TRAZODONE HCL 50 MG PO TABS: ORAL_TABLET | ORAL | 0 refills | Status: DC

## 2021-12-09 MED ORDER — MOXIFLOXACIN HCL 400 MG PO TABS: 400.0000 mg | ORAL_TABLET | Freq: Every day | ORAL | 0 refills | Status: DC

## 2021-12-09 MED ORDER — PREDNISONE 20 MG PO TABS: ORAL_TABLET | ORAL | 0 refills | Status: DC

## 2021-12-09 NOTE — Progress Notes (Signed)
Assessment and Plan:  [name] was seen today for acute visit.  Diagnoses and all orders for this visit:  Acute recurrent maxillary sinusitis Chronic rhinitis/post nasal drip following ENT with recurrent sinusitis and recent amox/azithro use Only day 2, continue other therapies, try steroid taper If no improvement in 3 days or worse despite steroid start Moxifloxacin Discussed importance of avoiding unnecessary abx use and to complete course once started Sleep in uncarpeted room with humidifier, push fluids, have carpets removed ASAP, if still having sx have home inspector come to check for mold, etc ED if progressive/worse despite treatment with severe HA, confusion or fever -     predniSONE (DELTASONE) 20 MG tablet; 2 tablets daily for 3 days, 1 tablet daily for 4 days. -     moxifloxacin (AVELOX) 400 MG tablet; Take 1 tablet (400 mg total) by mouth daily at 8 pm for 7 days.  Insomnia due to medical condition -     traZODone (DESYREL) 50 MG tablet; 1/2-2 tablet as needed for sleep   Further disposition pending results of labs. Discussed med's effects and SE's.   Over 30 minutes of exam, counseling, chart review, and critical decision making was performed.   Future Appointments  Date Time Provider [place]  01/18/2022  8:30 AM Nicholas Lose, MD [facility] None  01/24/2022  8:15 AM Ralene Bathe, MD [facility] None  02/06/2022 10:00 AM Unk Pinto, MD [facility] None  02/13/2022  9:00 AM Noreene Filbert, MD [facility] None    ------------------------------------------------------------------------------------------------------------------   HPI BP (!) 150/90    Pulse 99    Temp 97.7 F (36.5 C)    Wt 155 lb (70.3 kg)    SpO2 99%    BMI 24.28 kg/m  71 y.o.female presents for evaluation of possible sinusitis. She did test negative for covid 19 at home today prior to being seen.   She reports rhinitis/congestion at baseline, persistent x 2 years (since moving into current  town home, no previous hx, changes vent filters monthly, having carpets removed shortly but has been having frequent sinusitis in the interim), much worse suddenly yesterday and today, with bilateral maxillary tenderness, mild headache, felt mildly feverish this AM but no fever.   Has seen Dr. Constance Holster ENT for extensive workup, mostly recently saw him 11/10/2021, per his note had normal CT sinus, most recently he added singulair to flonase and claritin. Has failed ipratoprium in the past. She reports since onset has also started humidifier with vicks, saline irrigations.   Has seen allergist and doing shots weekly x 2 years without benefit  Also admits very poor sleep due to chronic post nasal drip, sleeping on 3 pillows, will try sleeping in room without carpet tonight, has failed benadryl, would like to try something for sleep.   She has recently completed courses of zpak and amoxicillin, reports poorly tolerates doxycycline.   Past Medical History:  Diagnosis Date   Allergy    Anxiety    Bell palsy 02/04/2016   Left   Cancer ([place]) 2011   [place]   Carpal tunnel syndrome on both sides 5/42/7062   Complication of anesthesia    Hyperlipidemia    Hypertension 2005   Plantar fasciitis    PONV (postoperative nausea and vomiting)      No Known Allergies  Current Outpatient Medications on File Prior to Visit  Medication Sig   allopurinol (ZYLOPRIM) 300 MG tablet TAKE 1 TABLET DAILY FOR GOUT PREVENTION   Ascorbic Acid (VITAMIN C) 1000 MG tablet  Take 1,000 mg by mouth daily.   aspirin EC 81 MG tablet Take 1 tablet (81 mg total) by mouth daily.   Cholecalciferol (VITAMIN D PO) Take 10,000 Units by mouth daily.   Cyanocobalamin (VITAMIN B-12 PO) Take 1 tablet by mouth daily.   EPINEPHrine 0.3 mg/0.3 mL IJ SOAJ injection INJECT 1 PEN IN THE MUSCLE ONE TIME AS DIRECTED   fenofibrate (TRICOR) 145 MG tablet TAKE 1 TABLET BY MOUTH ONCE DAILY FOR  TRIGLYCERIDES   fluticasone (FLONASE) 50 MCG/ACT nasal  spray Use 1 to 2 sprays each Nares 1 to 2 x /day   loratadine (CLARITIN) 10 MG tablet Take 10 mg by mouth daily.   montelukast (SINGULAIR) 10 MG tablet Take 10 mg by mouth at bedtime.   omeprazole (PRILOSEC) 40 MG capsule Take 40 mg by mouth daily.   phentermine (ADIPEX-P) 37.5 MG tablet Take  1 tablet  every Morning  for Dieting & Weight Loss   pravastatin (PRAVACHOL) 20 MG tablet TAKE 1 TABLET AT BEDTIME FOR CHOLESTEROL   pyridOXINE (VITAMIN B-6) 100 MG tablet Take 100 mg by mouth daily.   triamterene-hydrochlorothiazide (MAXZIDE-25) 37.5-25 MG tablet Take 1/2 to 1 tablet Daily for BP & Fluid Retention   zinc gluconate 50 MG tablet Take 50 mg by mouth daily.   topiramate (TOPAMAX) 50 MG tablet TAKE 2 TO 3 TABLETS BY MOUTH DAILY FOR DIETING AND WEIGHT LOSS (Patient not taking: Reported on 12/09/2021)   No current facility-administered medications on file prior to visit.   Allergies: No Known Allergies Surgical History:  She  has a past surgical history that includes Abdominal hysterectomy (1990); Tonsillectomy; Tendon repair; basal cell carcinoma (2011); and Breast lumpectomy with radioactive seed localization (Left, 12/05/2019). Social History:   reports that she has never smoked. She has never used smokeless tobacco. She reports current alcohol use. She reports that she does not use drugs.   ROS: all negative except above.   Physical Exam:  BP (!) 150/90    Pulse 99    Temp 97.7 F (36.5 C)    Wt 155 lb (70.3 kg)    SpO2 99%    BMI 24.28 kg/m   General Appearance: Well nourished, in no acute distress. Eyes: PERRLA, EOMs, conjunctiva no swelling or erythema Sinuses: No Frontal tenderness, + bil maxillary tenderness without erythema or edema ENT/Mouth: Ext aud canals clear, TMs without erythema, bulging. No erythema, swelling, or exudate on post pharynx.  Tonsils absent. Hearing normal.  Neck: Supple Respiratory: Respiratory effort normal, BS equal bilaterally without rales, rhonchi,  wheezing or stridor.  Cardio: RRR with no MRGs. Brisk peripheral pulses without edema.  Lymphatics: Non tender without lymphadenopathy.  Musculoskeletal: normal gait.  Skin: Warm, dry without rashes, lesions, ecchymosis.  Neuro: Cranial nerves intact. Normal muscle tone Psych: Awake and oriented X 3, normal affect, Insight and Judgment appropriate.     Izora Ribas, NP 12:15 PM [facility]

## 2021-12-09 NOTE — Patient Instructions (Signed)
Moxifloxacin tablets What is this medication? MOXIFLOXACIN (mox i FLOX a sin) is a quinolone antibiotic. It is used to treat certain kinds of bacterial infections. It will not work for colds, flu, or other viral infections. This medicine may be used for other purposes; ask your health care provider or pharmacist if you have questions. COMMON BRAND NAME(S): Avelox, Avelox ABC Pack What should I tell my care team before I take this medication? They need to know if you have any of these conditions: bone problems diabetes heart disease high blood pressure history of irregular heartbeat history of low levels of potassium in the blood joint problems kidney disease liver disease mental illness myasthenia gravis seizures tendon problems tingling of the fingers or toes, or other nerve disorder an unusual or allergic reaction to moxifloxacin, other quinolone antibiotics, other medicines, foods, dyes, or preservatives pregnant or trying to get pregnant breast-feeding How should I use this medication? Take this medicine by mouth with a full glass of water. Follow the directions on the prescription label. You can take it with or without food. If it upsets your stomach, take it with food. Take your medicine at regular intervals. Do not take your medicine more often than directed. Take all of your medicine as directed even if you think you are better. Do not skip doses or stop your medicine early. Avoid antacids, aluminum, calcium, iron, magnesium, and zinc products for 4 hours before and 8 hours after taking a dose of this medicine. A special MedGuide will be given to you by the pharmacist with each prescription and refill. Be sure to read this information carefully each time. Talk to your pediatrician regarding the use of this medicine in children. Special care may be needed. Overdosage: If you think you have taken too much of this medicine contact a poison control center or emergency room at  once. NOTE: This medicine is only for you. Do not share this medicine with others. What if I miss a dose? If you miss a dose, take it as soon as you can. If it is almost time for your next dose, take only that dose. Do not take double or extra doses. What may interact with this medication? Do not take this medicine with any of the following medications: cisapride dronedarone pimozide thioridazine This medicine may also interact with the following medications: antacids birth control pills certain medicines for diabetes, like glipizide, glyburide, or insulin certain medicines that treat or prevent blood clots like warfarin didanosine buffered tablets or powder multivitamins NSAIDS, medicines for pain and inflammation, like ibuprofen or naproxen other medicines that prolong the QT interval (cause an abnormal heart rhythm) like dofetilide, ziprasidone sucralfate This list may not describe all possible interactions. Give your health care provider a list of all the medicines, herbs, non-prescription drugs, or dietary supplements you use. Also tell them if you smoke, drink alcohol, or use illegal drugs. Some items may interact with your medicine. What should I watch for while using this medication? Tell your doctor or health care provider if your symptoms do not start to get better or if they get worse. This medicine may cause serious skin reactions. They can happen weeks to months after starting the medicine. Contact your health care provider right away if you notice fevers or flu-like symptoms with a rash. The rash may be red or purple and then turn into blisters or peeling of the skin. Or, you might notice a red rash with swelling of the face, lips or lymph nodes  in your neck or under your arms. Do not treat diarrhea with over the counter products. Contact your doctor if you have diarrhea that lasts more than 2 days or if it is severe and watery. This medicine may increase blood sugar. Ask your  health care provider if changes in diet or medicines are needed if you have diabetes. You may get drowsy or dizzy. Do not drive, use machinery, or do anything that needs mental alertness until you know how this medicine affects you. Do not stand or sit up quickly, especially if you are an older patient. This reduces the risk of dizzy or fainting spells. This medicine can make you more sensitive to the sun. Keep out of the sun. If you cannot avoid being in the sun, wear protective clothing and use sunscreen. Do not use sun lamps or tanning beds/booths. What side effects may I notice from receiving this medication? Side effects that you should report to your doctor or health care professional as soon as possible: allergic reactions like skin rash or hives, swelling of the face, lips, or tongue anxious bloody or watery diarrhea breathing problems confusion depressed mood fast, irregular heartbeat fever hallucination, loss of contact with reality joint, muscle, or tendon pain or swelling loss of memory pain, tingling, numbness in the hands or feet redness, blistering, peeling or loosening of the skin, including inside the mouth signs and symptoms of aortic dissection such as sudden chest, stomach, or back pain signs and symptoms of high blood sugar such as being more thirsty or hungry or having to urinate more than normal. You may also feel very tired or have blurry vision. signs and symptoms of low blood sugar such as feeling anxious; confusion; dizziness; increased hunger; unusually weak or tired; sweating; shakiness; cold; irritable; headache; blurred vision; fast heartbeat; loss of consciousness; pale skin suicidal thoughts or other mood changes sunburn unusually weak or tired Side effects that usually do not require medical attention (report to your doctor or health care professional if they continue or are bothersome): diarrhea dizziness dry mouth headache nausea trouble  sleeping This list may not describe all possible side effects. Call your doctor for medical advice about side effects. You may report side effects to FDA at 1-800-FDA-1088. Where should I keep my medication? Keep out of the reach of children. Store at room temperature between 15 and 30 degrees C (59 and 86 degrees F). Throw away any unused medicine after the expiration date. NOTE: This sheet is a summary. It may not cover all possible information. If you have questions about this medicine, talk to your doctor, pharmacist, or health care provider.  2022 Elsevier/Gold Standard (2019-04-04 00:00:00)

## 2021-12-10 ENCOUNTER — Other Ambulatory Visit: Payer: Self-pay | Admitting: Adult Health

## 2021-12-10 ENCOUNTER — Encounter: Payer: Self-pay | Admitting: Adult Health

## 2021-12-10 MED ORDER — DOXYCYCLINE HYCLATE 100 MG PO CAPS: ORAL_CAPSULE | ORAL | 0 refills | Status: DC

## 2021-12-15 DIAGNOSIS — J301 Allergic rhinitis due to pollen: Secondary | ICD-10-CM | POA: Diagnosis not present

## 2021-12-16 ENCOUNTER — Ambulatory Visit (INDEPENDENT_AMBULATORY_CARE_PROVIDER_SITE_OTHER): Payer: Medicare Other | Admitting: Podiatry

## 2021-12-16 ENCOUNTER — Encounter: Payer: Self-pay | Admitting: Podiatry

## 2021-12-16 ENCOUNTER — Other Ambulatory Visit: Payer: Self-pay

## 2021-12-16 ENCOUNTER — Ambulatory Visit (INDEPENDENT_AMBULATORY_CARE_PROVIDER_SITE_OTHER): Payer: Medicare Other

## 2021-12-16 DIAGNOSIS — M7751 Other enthesopathy of right foot: Secondary | ICD-10-CM

## 2021-12-16 DIAGNOSIS — L989 Disorder of the skin and subcutaneous tissue, unspecified: Secondary | ICD-10-CM | POA: Diagnosis not present

## 2021-12-16 MED ORDER — BETAMETHASONE SOD PHOS & ACET 6 (3-3) MG/ML IJ SUSP: 3.0000 mg | Freq: Once | INTRAMUSCULAR | Status: DC

## 2021-12-16 NOTE — Progress Notes (Signed)
° °  Subjective: 71 y.o. female presenting to the office today for follow up evaluation of right foot pain.  Patient was last seen in the office on 05/28/2020.  She says that she did have significant relief for several months however especially in the last few weeks she has had increased pain and tenderness associated to the right forefoot.  She presents for follow-up treatment and evaluation  Past Medical History:  Diagnosis Date   Allergy    Anxiety    Bell palsy 02/04/2016   Left   Cancer ([place]) 2011   [place]   Carpal tunnel syndrome on both sides 4/38/8875   Complication of anesthesia    Hyperlipidemia    Hypertension 2005   Plantar fasciitis    PONV (postoperative nausea and vomiting)      Objective:  Physical Exam General: Alert and oriented x3 in no acute distress  Dermatology: Hyperkeratotic lesion(s) present on the plantar third MTPJ right foot. Pain on palpation with a central nucleated core noted. Skin is warm, dry and supple bilateral lower extremities. Negative for open lesions or macerations.  Vascular: Palpable pedal pulses bilaterally. No edema or erythema noted. Capillary refill within normal limits.  Neurological: Epicritic and protective threshold grossly intact bilaterally.   Musculoskeletal Exam: Pain on palpation at the keratotic lesion(s) noted. Pain with palpation noted to the 3rd MPJ of the right foot. Range of motion within normal limits bilateral. Muscle strength 5/5 in all groups bilateral.  Radiographic exam: Normal osseous mineralization.  No fractures identified.  Joint spaces preserved.  Healed surgery to the first metatarsal with orthopedic screws that appear to be intact.  There does appear to be some possible arthritic changes to the TMT joints specifically 1, 2, 3.  Assessment: 1. 3rd MPJ capsulitis right  2. Pre-ulcerative callus lesion right   Plan of Care:  1. Patient evaluated.  The patient got significant lasting relief for several months after  injection and debridement last visit.  We will go ahead with the same regimen that we performed before 2. Excisional debridement of keratoic lesion(s) using a chisel blade was performed without incident.  3. Dressed area with light dressing. 4. Injection of 0.5 mLs Celestone Soluspan injected into the 3rd MPJ of the right foot.  5. Patient is to return to the clinic PRN.   Edrick Kins, DPM [facility]  Dr. Edrick Kins, [address]                                        [place], [place] [address]                Office [phone]  Fax [phone]

## 2021-12-19 ENCOUNTER — Encounter: Payer: Self-pay | Admitting: Internal Medicine

## 2021-12-19 DIAGNOSIS — D125 Benign neoplasm of sigmoid colon: Secondary | ICD-10-CM | POA: Diagnosis not present

## 2021-12-19 DIAGNOSIS — K649 Unspecified hemorrhoids: Secondary | ICD-10-CM | POA: Diagnosis not present

## 2021-12-19 DIAGNOSIS — Z86010 Personal history of colonic polyps: Secondary | ICD-10-CM | POA: Diagnosis not present

## 2021-12-19 DIAGNOSIS — K5730 Diverticulosis of large intestine without perforation or abscess without bleeding: Secondary | ICD-10-CM | POA: Diagnosis not present

## 2021-12-19 LAB — HM COLONOSCOPY

## 2021-12-21 DIAGNOSIS — D125 Benign neoplasm of sigmoid colon: Secondary | ICD-10-CM | POA: Diagnosis not present

## 2021-12-29 DIAGNOSIS — J301 Allergic rhinitis due to pollen: Secondary | ICD-10-CM | POA: Diagnosis not present

## 2022-01-02 DIAGNOSIS — J301 Allergic rhinitis due to pollen: Secondary | ICD-10-CM | POA: Diagnosis not present

## 2022-01-02 DIAGNOSIS — J310 Chronic rhinitis: Secondary | ICD-10-CM | POA: Diagnosis not present

## 2022-01-04 ENCOUNTER — Encounter: Payer: Self-pay | Admitting: Internal Medicine

## 2022-01-10 ENCOUNTER — Encounter: Payer: Medicare Other | Admitting: Internal Medicine

## 2022-01-12 ENCOUNTER — Encounter: Payer: Medicare Other | Admitting: Internal Medicine

## 2022-01-12 DIAGNOSIS — J301 Allergic rhinitis due to pollen: Secondary | ICD-10-CM | POA: Diagnosis not present

## 2022-01-17 NOTE — Assessment & Plan Note (Signed)
12/05/2019: Left lumpectomy by Dr. Lucia Gaskins: Intermediate grade DCIS, 0.3 cm, margins clear, ER 100%, PR 90% Staging: Tis NX stage 0  Treatment [place]: [place] 2.Adjuvant antiestrogen therapy with tamoxifen(started 5 mg now taking 5 mg every other day) Discontinued it (dizziness, fogginess and confusion and fatigue ---------------------------------------------------------------------------------------------------------------------------------------------------  Breast Cancer Surveillance: 1. Breast Exam:01/18/22 Benign 2. Mammogram: 11/22/21: Benign Density Cat B at [facility]  Left breast tenderness: Benign postoperative tenderness.  No palpable lumps or nodules of concern RTC in 1 year

## 2022-01-17 NOTE — Progress Notes (Signed)
Patient Care Team: Unk Pinto, MD as PCP - General (Internal Medicine) Avon Gully, NP as Nurse Practitioner (Obstetrics and Gynecology) Clarene Essex, MD as Consulting Physician (Gastroenterology) Suella Broad, MD as Consulting Physician (Physical Medicine and Rehabilitation) Jari Pigg, MD as Consulting Physician (Dermatology) Nicholas Lose, MD as Consulting Physician (Hematology and Oncology) Alphonsa Overall, MD as Consulting Physician (General Surgery) Noreene Filbert, MD as Referring Physician (Radiation Oncology)  DIAGNOSIS:    ICD-10-CM   1. Ductal carcinoma in situ (DCIS) of left breast  D05.12       SUMMARY OF ONCOLOGIC HISTORY: Oncology History  Ductal carcinoma in situ (DCIS) of left breast  11/17/2019 Initial Diagnosis   Screening mammogram detected left breast density UOQ 5 mm at 10 o'clock position.  Stereotactic biopsy revealed intermediate grade DCIS ER/PR positive   11/19/2019 Cancer Staging   Staging form: Breast, AJCC 8th Edition - Clinical stage from 11/19/2019: Stage 0 (cTis (DCIS), cN0, cM0, G2, ER+, PR+, HER2: Not Assessed)   12/05/2019 Surgery   Left lumpectomy [name]) [phone]): DCIS, intermediate grade, 0.3cm, clear margins. No regional lymph nodes examined.   12/05/2019 Cancer Staging   Staging form: Breast, AJCC 8th Edition - Pathologic stage from 12/05/2019: Stage 0 (pTis (DCIS), pN0, cM0, ER+, PR+)   12/31/2019 - 02/03/2020 Radiation Therapy   Adjuvant radiation at [facility]   01/2020 - 01/2020 Anti-estrogen oral therapy   Tamoxifen 5 mg daily, discontinued due to severe debilitating side effects which included dizziness, lightheadedness, mental fogginess, irritability, hot flashes, etc.     CHIEF COMPLIANT: Follow-up of left breast DCIS   INTERVAL HISTORY: [name] is a 71 y.o. with above-mentioned history of left breast DCIS treated with a left lumpectomy, radiation, and she could not take antiestrogen therapy with tamoxifen  because of adverse effects. Mammogram on 11/22/2021 showed no evidence of malignancy bilaterally. She presents to the clinic today for follow-up.   ALLERGIES:  has No Known Allergies.  MEDICATIONS:  Current Outpatient Medications  Medication Sig Dispense Refill   allopurinol (ZYLOPRIM) 300 MG tablet TAKE 1 TABLET DAILY FOR GOUT PREVENTION 90 tablet 1   Ascorbic Acid (VITAMIN C) 1000 MG tablet Take 1,000 mg by mouth daily.     aspirin EC 81 MG tablet Take 1 tablet (81 mg total) by mouth daily.     Biotin 10 MG TABS Take by mouth.     Cholecalciferol (VITAMIN D PO) Take 10,000 Units by mouth daily.     Cholecalciferol 25 MCG (1000 UT) tablet Take by mouth.     Cyanocobalamin (VITAMIN B-12 PO) Take 1 tablet by mouth daily.     cyanocobalamin 1000 MCG tablet Take by mouth.     EPINEPHrine 0.3 mg/0.3 mL IJ SOAJ injection INJECT 1 PEN IN THE MUSCLE ONE TIME AS DIRECTED     fenofibrate (TRICOR) 145 MG tablet TAKE 1 TABLET BY MOUTH ONCE DAILY FOR  TRIGLYCERIDES 90 tablet 0   fluticasone (FLONASE) 50 MCG/ACT nasal spray Use 1 to 2 sprays each Nares 1 to 2 x /day 48 g 3   fluticasone (FLONASE) 50 MCG/ACT nasal spray Place into the nose.     loratadine (CLARITIN) 10 MG tablet Take 10 mg by mouth daily.     montelukast (SINGULAIR) 10 MG tablet Take 10 mg by mouth at bedtime.     montelukast (SINGULAIR) 10 MG tablet Take by mouth.     omeprazole (PRILOSEC) 40 MG capsule Take 40 mg by mouth daily.     omeprazole (PRILOSEC)  40 MG capsule Take by mouth.     phentermine (ADIPEX-P) 37.5 MG tablet Take  1 tablet  every Morning  for Dieting & Weight Loss 90 tablet 1   pravastatin (PRAVACHOL) 20 MG tablet TAKE 1 TABLET AT BEDTIME FOR CHOLESTEROL 90 tablet 2   predniSONE (DELTASONE) 20 MG tablet 2 tablets daily for 3 days, 1 tablet daily for 4 days. 10 tablet 0   pyridOXINE (VITAMIN B-6) 100 MG tablet Take 100 mg by mouth daily.     topiramate (TOPAMAX) 50 MG tablet TAKE 2 TO 3 TABLETS BY MOUTH DAILY FOR  DIETING AND WEIGHT LOSS (Patient not taking: Reported on 12/09/2021) 270 tablet 1   traZODone (DESYREL) 50 MG tablet 1/2-2 tablet as needed for sleep 30 tablet 0   triamterene-hydrochlorothiazide (MAXZIDE-25) 37.5-25 MG tablet Take 1/2 to 1 tablet Daily for BP & Fluid Retention 90 tablet 3   zinc gluconate 50 MG tablet Take 50 mg by mouth daily.     Current Facility-Administered Medications  Medication Dose Route Frequency Provider Last Rate Last Admin   betamethasone acetate-betamethasone sodium phosphate (CELESTONE) injection 3 mg  3 mg Intra-articular Once Edrick Kins, DPM        PHYSICAL EXAMINATION: ECOG PERFORMANCE STATUS: 1 - Symptomatic but completely ambulatory  There were no vitals filed for this visit. There were no vitals filed for this visit.  BREAST: No palpable masses or nodules in either right or left breasts. No palpable axillary supraclavicular or infraclavicular adenopathy no breast tenderness or nipple discharge. (exam performed in the presence of a chaperone)  LABORATORY DATA:  I have reviewed the data as listed CMP Latest Ref Rng & Units 07/25/2021 01/04/2021 05/26/2020  Glucose 65 - 99 mg/dL 90 85 85  BUN 7 - 25 mg/dL 27(H) 29(H) 19  Creatinine 0.60 - 1.00 mg/dL 0.99 1.15(H) 1.04(H)  Sodium 135 - 146 mmol/L 139 138 139  Potassium 3.5 - 5.3 mmol/L 3.7 3.7 3.7  Chloride 98 - 110 mmol/L 101 99 101  CO2 20 - 32 mmol/L '31 28 31  ' Calcium 8.6 - 10.4 mg/dL 10.1 10.5(H) 10.4  Total Protein 6.1 - 8.1 g/dL 7.3 7.9 7.3  Total Bilirubin 0.2 - 1.2 mg/dL 0.6 0.6 0.7  Alkaline Phos 38 - 126 U/L - - -  AST 10 - 35 U/L '20 21 25  ' ALT 6 - 29 U/L '16 17 19    ' Lab Results  Component Value Date   WBC 4.9 07/25/2021   HGB 14.4 07/25/2021   HCT 42.4 07/25/2021   MCV 86.7 07/25/2021   PLT 203 07/25/2021   NEUTROABS 2,553 07/25/2021    ASSESSMENT & PLAN:  Ductal carcinoma in situ (DCIS) of left breast 12/05/2019: Left lumpectomy by Dr. Lucia [name]: Intermediate grade DCIS, 0.3 cm,  margins clear, ER 100%, PR 90% Staging: Tis NX stage 0   Treatment plan: 1.  Adjuvant radiation therapy at [facility] completed 02/03/20 2.  Adjuvant antiestrogen therapy with tamoxifen (started 5 mg now taking 5 mg every other day) Discontinued it (dizziness, fogginess and confusion and fatigue  ---------------------------------------------------------------------------------------------------------------------------------------------------   Breast Cancer Surveillance: 1. Breast Exam:01/18/22 Benign 2. Mammogram: 11/22/21: Benign Density Cat B at [facility]   Left breast tenderness: Benign postoperative tenderness.  No palpable lumps or nodules of concern RTC in 1 year    No orders of the defined types were placed in this encounter.  The patient has a good understanding of the overall plan. she agrees with it. she will call with any  problems that may develop before the next visit here.  Total time spent: 20 mins including face to face time and time spent for planning, charting and coordination of care  Rulon Eisenmenger, MD, MPH 01/18/2022  I, Thana Ates, am acting as scribe for Dr. Nicholas Lose.  I have reviewed the above documentation for accuracy and completeness, and I agree with the above.

## 2022-01-18 ENCOUNTER — Other Ambulatory Visit: Payer: Self-pay

## 2022-01-18 ENCOUNTER — Inpatient Hospital Stay: Payer: Medicare Other | Attending: Hematology and Oncology | Admitting: Hematology and Oncology

## 2022-01-18 DIAGNOSIS — Z7982 Long term (current) use of aspirin: Secondary | ICD-10-CM | POA: Insufficient documentation

## 2022-01-18 DIAGNOSIS — Z79899 Other long term (current) drug therapy: Secondary | ICD-10-CM | POA: Diagnosis not present

## 2022-01-18 DIAGNOSIS — Z86000 Personal history of in-situ neoplasm of breast: Secondary | ICD-10-CM | POA: Insufficient documentation

## 2022-01-18 DIAGNOSIS — Z7952 Long term (current) use of systemic steroids: Secondary | ICD-10-CM | POA: Insufficient documentation

## 2022-01-18 DIAGNOSIS — Z923 Personal history of irradiation: Secondary | ICD-10-CM | POA: Diagnosis not present

## 2022-01-18 DIAGNOSIS — D0512 Intraductal carcinoma in situ of left breast: Secondary | ICD-10-CM

## 2022-01-19 DIAGNOSIS — J301 Allergic rhinitis due to pollen: Secondary | ICD-10-CM | POA: Diagnosis not present

## 2022-01-24 ENCOUNTER — Other Ambulatory Visit: Payer: Self-pay

## 2022-01-24 ENCOUNTER — Ambulatory Visit (INDEPENDENT_AMBULATORY_CARE_PROVIDER_SITE_OTHER): Payer: Medicare Other | Admitting: Dermatology

## 2022-01-24 DIAGNOSIS — L821 Other seborrheic keratosis: Secondary | ICD-10-CM

## 2022-01-24 DIAGNOSIS — L820 Inflamed seborrheic keratosis: Secondary | ICD-10-CM

## 2022-01-24 DIAGNOSIS — L578 Other skin changes due to chronic exposure to nonionizing radiation: Secondary | ICD-10-CM

## 2022-01-24 NOTE — Patient Instructions (Addendum)

## 2022-01-24 NOTE — Progress Notes (Signed)
° °  Follow-Up Visit   Subjective  [name] is a 71 y.o. female who presents for the following: Follow-up (ISK follow up of arms and hands treated with LN2). The patient has spots, moles and lesions to be evaluated, some may be new or changing and the patient has concerns that these could be cancer.  The following portions of the chart were reviewed this encounter and updated as appropriate:   Tobacco   Allergies   Meds   Problems   Med Hx   Surg Hx   Fam Hx      Review of Systems:  No other skin or systemic complaints except as noted in HPI or Assessment and Plan.  Objective  Well appearing patient in no apparent distress; mood and affect are within normal limits.  A focused examination was performed including arms, hands. Relevant physical exam findings are noted in the Assessment and Plan.  L dorsum hand, L arm, R dorsum hand, R arm, neck x 30 (30) Stuck on waxy paps with erythema   Assessment & Plan   Actinic Damage - chronic, secondary to cumulative UV radiation exposure/sun exposure over time - diffuse scaly erythematous macules with underlying dyspigmentation - Recommend daily broad spectrum sunscreen SPF 30+ to sun-exposed areas, reapply every 2 hours as needed.  - Recommend staying in the shade or wearing long sleeves, sun glasses (UVA+UVB protection) and wide brim hats (4-inch brim around the entire circumference of the hat). - Call for new or changing lesions.  Seborrheic Keratoses - Stuck-on, waxy, tan-brown papules and/or plaques  - Benign-appearing - Discussed benign etiology and prognosis. - Observe - Call for any changes  Inflamed seborrheic keratosis (30) L dorsum hand, L arm, R dorsum hand, R arm, neck x 30  Destruction of lesion - L dorsum hand, L arm, R dorsum hand, R arm, neck x 30 Complexity: simple   Destruction method: cryotherapy   Informed consent: discussed and consent obtained   Timeout:  patient name, date of birth, surgical site, and  procedure verified Lesion destroyed using liquid nitrogen: Yes   Region frozen until ice ball extended beyond lesion: Yes   Outcome: patient tolerated procedure well with no complications   Post-procedure details: wound care instructions given     Return in about 8 weeks (around 03/21/2022) for ISK f/u.  I, Othelia Pulling, RMA, am acting as scribe for Sarina Ser, MD . Documentation: I have reviewed the above documentation for accuracy and completeness, and I agree with the above.  Sarina Ser, MD

## 2022-01-26 ENCOUNTER — Encounter: Payer: Self-pay | Admitting: Dermatology

## 2022-01-26 DIAGNOSIS — J301 Allergic rhinitis due to pollen: Secondary | ICD-10-CM | POA: Diagnosis not present

## 2022-01-27 ENCOUNTER — Other Ambulatory Visit: Payer: Self-pay | Admitting: Nurse Practitioner

## 2022-01-27 DIAGNOSIS — E782 Mixed hyperlipidemia: Secondary | ICD-10-CM

## 2022-02-05 NOTE — Progress Notes (Signed)
Comprehensive Evaluation &  Examination  Future Appointments  Date Time Provider Department  02/06/2022 10:00 AM Unk Pinto, MD [facility]  02/13/2022  9:00 AM Noreene Filbert, MD [facility]  03/20/2022  8:30 AM Ralene Bathe, MD [facility]  01/22/2023  8:30 AM Nicholas Lose, MD [facility]  02/12/2023 11:00 AM Unk Pinto, MD [facility]        This very nice 71 y.o.  MWF presents for a comprehensive evaluation and management of multiple medical co-morbidities.  Patient has been followed for HTN, HLD, Prediabetes  and Vitamin D Deficiency. Patient has Gout & GERD controlled respectfully on her Allopurinol & Omeprazole .        Today,  she's c/o chronic postnasal drainage despite trials on Claritin, Flonase, nasal Atrovent,  & PSE-PE without any appreciable benefit.         Had Lt Breast Lumpectomy with CIS - clear margins in Jan 2021 by Dr Alphonsa Overall and had po Radiotherapy and is  followed annually by Dr Lindi Adie.  She was intolerant to Tamoxifen.         HTN predates circa 1998. Patient's BP has been controlled at home and patient denies any cardiac symptoms as chest pain, palpitations, shortness of breath, dizziness or ankle swelling. Today's BP is at goal -  130/82.        Patient's hyperlipidemia (2015) is controlled with diet and Fenofibrate.  Patient denies myalgias or other medication SE's. Last lipids were at goal :  Lab Results  Component Value Date   CHOL 138 07/25/2021   HDL 44 (L) 07/25/2021   LDLCALC 74 07/25/2021   TRIG 123 07/25/2021   CHOLHDL 3.1 07/25/2021         Patient has hx/o prediabetes (A1c 5.9% /2012) and patient denies reactive hypoglycemic symptoms, visual blurring, diabetic polys or paresthesias. Last A1c was normal & at goal :  Lab Results  Component Value Date   HGBA1C 5.6 07/25/2021         Finally, patient has history of Vitamin D Deficiency ("33" /2018) and last Vitamin D was at goal:  Lab Results  Component Value Date   VD25OH  84 01/04/2021     Current Outpatient Medications on File Prior to Visit  Medication Sig   allopurinol  300 MG tablet TAKE 1 TABLET DAILY    VITAMIN C 1000 MG tablet Take  daily.   aspirin EC 81 MG tablet Take 1 tablet  daily.   VITAMIN D   10,000 Units Take   daily.   Cholecalciferol 25 MCG (1000 UT) tablet Take    VITAMIN B-12 1000 MCG tablet Take 1 tablet daily.   EPINEPHrine 0.3 mg/0.3 mL injection INJECT 1 PEN IM  1 x/  AS DIRECTED   fenofibrate  145 MG tablet TAKE 1 TABLET  DAILY    FLONASE  nasal spray Use 1 to 2 sprays each Nares 1 to 2 x /day   loratadine 10 MG tablet Take  daily.   omeprazole 40 MG capsule Take    phentermine  37.5 MG tablet Take  1 tablet  every Morning    VITAMIN B-6 100 MG tablet Take 1 daily.   traZODone 50 MG tablet 1/2-2 tablet as needed for sleep   triamterene-hctz  37.5-25 MG tablet Take 1/2 to 1 tablet Daily   zinc 50 MG tablet Take daily.    No Known Allergies   Past Medical History:  Diagnosis Date   Allergy    Anxiety  Bell palsy 02/04/2016   Left   Cancer ([place]) 2011   [facility]CC   Carpal tunnel syndrome on both sides 1/96/2229   Complication of anesthesia    Hyperlipidemia    Hypertension 2005   Plantar fasciitis    PONV (postoperative nausea and vomiting)      Health Maintenance  Topic Date Due   Pneumonia Vaccine 71+ Years old (2 - PCV) 02/08/2018   COVID-19 Vaccine (5 - Booster for [place] series) 08/02/2021   DEXA SCAN  07/25/2022 (Originally 12/16/2015)   TETANUS/TDAP  07/25/2022 (Originally 11/28/2019)   MAMMOGRAM  11/22/2022   HPV VACCINES  Aged Out   Zoster Vaccines- Shingrix  Discontinued     Immunization History  Administered Date(s) Administered   Influenza,inj,quad  08/27/2017   Influenza  10/02/2018, 10/27/2019   PFIZER Comirnaty  Covid-19 Vacci 09/05/2020, 06/07/2021   PFIZER SARS-COV-2 Vacci 01/03/2020, 01/24/2020   PPD Test 05/14/2014   Pneumococcal -23 02/08/2017    - Patient had Colonoscopies in 2006 &  2011 w/ an adenomatous polyp ([place]).   - Last Colon  - 03/27/2016 - Dr Watt Climes - recc 5 yr f/u due May 2022- Overdue  - Patient relates that she had a (-) Colonoscopy  Jan 2023 by Dr Aggie Hacker                                                                                                                                                                                                                                                 Last MGM - 11/22/2021   Past Surgical History:  Procedure Laterality Date   ABDOMINAL HYSTERECTOMY  1990   basal cell carcinoma  2011   nose   BREAST LUMPECTOMY WITH RADIOACTIVE SEED LOCALIZATION Left 12/05/2019   Procedure: LEFT BREAST LUMPECTOMY WITH RADIOACTIVE SEED LOCALIZATION;  Surgeon: Alphonsa Overall, MD;  Location: [place];  Service: General;  Laterality: Left;   TENDON REPAIR     plantar fasciitis   TONSILLECTOMY       Family History  Problem Relation Age of Onset   Hypertension Mother    Heart attack Mother    Hyperlipidemia Mother    Heart disease Mother 1       fatal MI   Stroke Mother    Arthritis Mother        rheumatoid   Hypertension Brother  Cancer Brother        throat, leukemia   Parkinson's disease Brother    Cancer Father        lung     Social History   Tobacco Use   Smoking status: Never   Smokeless tobacco: Never  Vaping Use   Vaping Use: Never used  Substance Use Topics   Alcohol use: Yes   Drug use: Never      ROS Constitutional: Denies fever, chills, weight loss/gain, headaches, insomnia,  night sweats, and change in appetite. Does c/o fatigue. Eyes: Denies redness, blurred vision, diplopia, discharge, itchy, watery eyes.  ENT: Denies discharge, congestion, post nasal drip, epistaxis, sore throat, earache, hearing loss, dental pain, Tinnitus, Vertigo, Sinus pain, snoring.  Cardio: Denies chest pain, palpitations, irregular heartbeat, syncope, dyspnea, diaphoresis, orthopnea, PND, claudication,  edema Respiratory: denies cough, dyspnea, DOE, pleurisy, hoarseness, laryngitis, wheezing.  Gastrointestinal: Denies dysphagia, heartburn, reflux, water brash, pain, cramps, nausea, vomiting, bloating, diarrhea, constipation, hematemesis, melena, hematochezia, jaundice, hemorrhoids Genitourinary: Denies dysuria, frequency, urgency, nocturia, hesitancy, discharge, hematuria, flank pain Breast: Breast lumps, nipple discharge, bleeding.  Musculoskeletal: Denies arthralgia, myalgia, stiffness, Jt. Swelling, pain, limp, and strain/sprain. Denies falls. Skin: Denies puritis, rash, hives, warts, acne, eczema, changing in skin lesion Neuro: No weakness, tremor, incoordination, spasms, paresthesia, pain Psychiatric: Denies confusion, memory loss, sensory loss. Denies Depression. Endocrine: Denies change in weight, skin, hair change, nocturia, and paresthesia, diabetic polys, visual blurring, hyper / hypo glycemic episodes.  Heme/Lymph: No excessive bleeding, bruising, enlarged lymph nodes.  Physical Exam  BP 130/82    Pulse 83    Temp 97.9 F (36.6 C)    Resp 17    Ht '5\' 7"'$  (1.702 m)    Wt 150 lb 3.2 oz (68.1 kg)    SpO2 97%    BMI 23.52 kg/m   General Appearance: Well nourished, well groomed and in no apparent distress.  Eyes: PERRLA, EOMs, conjunctiva no swelling or erythema, normal fundi and vessels. Sinuses: No frontal/maxillary tenderness ENT/Mouth: EACs patent / TMs  nl. Nares clear without erythema, swelling, mucoid exudates. Oral hygiene is good. No erythema, swelling, or exudate. Tongue normal, non-obstructing. Tonsils not swollen or erythematous. Hearing normal.  Neck: Supple, thyroid not palpable. No bruits, nodes or JVD. Respiratory: Respiratory effort normal.  BS equal and clear bilateral without rales, rhonci, wheezing or stridor. Cardio: Heart sounds are normal with regular rate and rhythm and no murmurs, rubs or gallops. Peripheral pulses are normal and equal bilaterally without  edema. No aortic or femoral bruits. Chest: symmetric with normal excursions and percussion. Breasts: Symmetric, without lumps, nipple discharge, retractions, or fibrocystic changes.  Abdomen: Flat, soft with bowel sounds active. Nontender, no guarding, rebound, hernias, masses, or organomegaly.  Lymphatics: Non tender without lymphadenopathy.  Musculoskeletal: Full ROM all peripheral extremities, joint stability, 5/5 strength, and normal gait. Skin: Warm and dry without rashes, lesions, cyanosis, clubbing or  ecchymosis.  Neuro: Cranial nerves intact, reflexes equal bilaterally. Normal muscle tone, no cerebellar symptoms. Sensation intact.  Pysch: Alert and oriented X 3, normal affect, Insight and Judgment appropriate.    Assessment and Plan   1. Essential hypertension  - EKG 12-Lead - Urinalysis, Routine w reflex microscopic - Microalbumin / creatinine urine ratio - CBC with Differential/Platelet - COMPLETE METABOLIC PANEL WITH GFR - Magnesium - TSH  2. Hyperlipidemia, mixed  - EKG 12-Lead - Lipid panel - TSH  3. Abnormal glucose  - EKG 12-Lead - Hemoglobin A1c - Insulin, random  4. Vitamin  D deficiency  - VITAMIN D 25 Hydroxy   5. Chronic gout without tophus  - Uric acid  6. Screening for colorectal cancer  - Ambulatory referral to Gastroenterology  7. Screening for ischemic heart disease  - EKG 12-Lead  8. FHx: heart disease  - EKG 12-Lead  9. Ductal carcinoma in situ (DCIS) of left breast   10. Medication management  - Urinalysis, Routine w reflex microscopic - Microalbumin / creatinine urine ratio - CBC with Differential/Platelet - COMPLETE METABOLIC PANEL WITH GFR - Magnesium - Lipid panel - TSH - Hemoglobin A1c - Insulin, random - VITAMIN D 25 Hydroxy          Patient was counseled in prudent diet to achieve/maintain BMI less than 25 for weight control, BP monitoring, regular exercise and medications. Discussed med's effects and SE's.  Screening labs and tests as requested with regular follow-up as recommended. Over 40 minutes of exam, counseling, chart review and high complex critical decision making was performed.   Kirtland Bouchard, MD

## 2022-02-05 NOTE — Patient Instructions (Signed)

## 2022-02-06 ENCOUNTER — Encounter: Payer: Self-pay | Admitting: Internal Medicine

## 2022-02-06 ENCOUNTER — Other Ambulatory Visit: Payer: Self-pay

## 2022-02-06 ENCOUNTER — Ambulatory Visit (INDEPENDENT_AMBULATORY_CARE_PROVIDER_SITE_OTHER): Payer: Medicare Other | Admitting: Internal Medicine

## 2022-02-06 VITALS — BP 130/82 | HR 83 | Temp 97.9°F | Resp 17 | Ht 67.0 in | Wt 150.2 lb

## 2022-02-06 DIAGNOSIS — Z1211 Encounter for screening for malignant neoplasm of colon: Secondary | ICD-10-CM

## 2022-02-06 DIAGNOSIS — R7309 Other abnormal glucose: Secondary | ICD-10-CM

## 2022-02-06 DIAGNOSIS — Z79899 Other long term (current) drug therapy: Secondary | ICD-10-CM

## 2022-02-06 DIAGNOSIS — Z8249 Family history of ischemic heart disease and other diseases of the circulatory system: Secondary | ICD-10-CM

## 2022-02-06 DIAGNOSIS — D0512 Intraductal carcinoma in situ of left breast: Secondary | ICD-10-CM

## 2022-02-06 DIAGNOSIS — E559 Vitamin D deficiency, unspecified: Secondary | ICD-10-CM | POA: Diagnosis not present

## 2022-02-06 DIAGNOSIS — M1A9XX0 Chronic gout, unspecified, without tophus (tophi): Secondary | ICD-10-CM

## 2022-02-06 DIAGNOSIS — I10 Essential (primary) hypertension: Secondary | ICD-10-CM

## 2022-02-06 DIAGNOSIS — E782 Mixed hyperlipidemia: Secondary | ICD-10-CM

## 2022-02-06 DIAGNOSIS — Z136 Encounter for screening for cardiovascular disorders: Secondary | ICD-10-CM | POA: Diagnosis not present

## 2022-02-06 MED ORDER — PHENTERMINE HCL 37.5 MG PO TABS: ORAL_TABLET | ORAL | 1 refills | Status: DC

## 2022-02-06 MED ORDER — PSEUDOEPHEDRINE HCL ER 120 MG PO TB12
ORAL_TABLET | ORAL | 3 refills | Status: DC
Start: 2022-02-06 — End: 2022-09-09

## 2022-02-07 ENCOUNTER — Other Ambulatory Visit: Payer: Self-pay | Admitting: Internal Medicine

## 2022-02-07 ENCOUNTER — Encounter: Payer: Self-pay | Admitting: Internal Medicine

## 2022-02-07 ENCOUNTER — Telehealth: Payer: Self-pay | Admitting: *Deleted

## 2022-02-07 DIAGNOSIS — N3000 Acute cystitis without hematuria: Secondary | ICD-10-CM | POA: Diagnosis not present

## 2022-02-07 LAB — CBC WITH DIFFERENTIAL/PLATELET
Absolute Monocytes: 329 cells/uL (ref 200–950)
Basophils Absolute: 41 cells/uL (ref 0–200)
Basophils Relative: 0.9 %
Eosinophils Absolute: 297 cells/uL (ref 15–500)
Eosinophils Relative: 6.6 %
HCT: 39.9 % (ref 35.0–45.0)
Hemoglobin: 14.1 g/dL (ref 11.7–15.5)
Lymphs Abs: 1670 cells/uL (ref 850–3900)
MCH: 31.3 pg (ref 27.0–33.0)
MCHC: 35.3 g/dL (ref 32.0–36.0)
MCV: 88.7 fL (ref 80.0–100.0)
MPV: 9.3 fL (ref 7.5–12.5)
Monocytes Relative: 7.3 %
Neutro Abs: 2165 cells/uL (ref 1500–7800)
Neutrophils Relative %: 48.1 %
Platelets: 203 10*3/uL (ref 140–400)
RBC: 4.5 10*6/uL (ref 3.80–5.10)
RDW: 13 % (ref 11.0–15.0)
Total Lymphocyte: 37.1 %
WBC: 4.5 10*3/uL (ref 3.8–10.8)

## 2022-02-07 LAB — LIPID PANEL
Cholesterol: 127 mg/dL (ref <200)
HDL: 52 mg/dL (ref >=50)
LDL Cholesterol (Calc): 57 mg/dL (calc)
Non-HDL Cholesterol (Calc): 75 mg/dL (calc) (ref <130)
Total CHOL/HDL Ratio: 2.4 (calc) (ref <5.0)
Triglycerides: 95 mg/dL (ref <150)

## 2022-02-07 LAB — COMPLETE METABOLIC PANEL WITH GFR
AG Ratio: 1.7 (calc) (ref 1.0–2.5)
ALT: 16 U/L (ref 6–29)
AST: 21 U/L (ref 10–35)
Albumin: 4.5 g/dL (ref 3.6–5.1)
Alkaline phosphatase (APISO): 80 U/L (ref 37–153)
BUN: 25 mg/dL (ref 7–25)
CO2: 31 mmol/L (ref 20–32)
Calcium: 9.7 mg/dL (ref 8.6–10.4)
Chloride: 102 mmol/L (ref 98–110)
Creat: 0.94 mg/dL (ref 0.60–1.00)
Globulin: 2.7 g/dL (calc) (ref 1.9–3.7)
Glucose, Bld: 91 mg/dL (ref 65–99)
Potassium: 3.5 mmol/L (ref 3.5–5.3)
Sodium: 141 mmol/L (ref 135–146)
Total Bilirubin: 0.8 mg/dL (ref 0.2–1.2)
Total Protein: 7.2 g/dL (ref 6.1–8.1)
eGFR: 65 mL/min/{1.73_m2} (ref >=60)

## 2022-02-07 LAB — URINALYSIS, ROUTINE W REFLEX MICROSCOPIC
Bilirubin Urine: NEGATIVE
Glucose, UA: NEGATIVE
Hgb urine dipstick: NEGATIVE
Hyaline Cast: NONE SEEN /LPF
Ketones, ur: NEGATIVE
Nitrite: NEGATIVE
Protein, ur: NEGATIVE
RBC / HPF: NONE SEEN /HPF (ref 0–2)
Specific Gravity, Urine: 1.011 (ref 1.001–1.035)
Squamous Epithelial / HPF: NONE SEEN /HPF (ref <=5)
pH: 5.5 (ref 5.0–8.0)

## 2022-02-07 LAB — TSH: TSH: 1.41 mIU/L (ref 0.40–4.50)

## 2022-02-07 LAB — VITAMIN D 25 HYDROXY (VIT D DEFICIENCY, FRACTURES): Vit D, 25-Hydroxy: 73 ng/mL (ref 30–100)

## 2022-02-07 LAB — HEMOGLOBIN A1C
Hgb A1c MFr Bld: 5.6 % of total Hgb (ref <5.7)
Mean Plasma Glucose: 114 mg/dL
eAG (mmol/L): 6.3 mmol/L

## 2022-02-07 LAB — MICROALBUMIN / CREATININE URINE RATIO
Creatinine, Urine: 52 mg/dL (ref 20–275)
Microalb Creat Ratio: 4 mcg/mg creat (ref <30)
Microalb, Ur: 0.2 mg/dL

## 2022-02-07 LAB — MICROSCOPIC MESSAGE

## 2022-02-07 LAB — MAGNESIUM: Magnesium: 2.1 mg/dL (ref 1.5–2.5)

## 2022-02-07 LAB — INSULIN, RANDOM: Insulin: 7.2 u[IU]/mL

## 2022-02-07 LAB — URIC ACID: Uric Acid, Serum: 4.2 mg/dL (ref 2.5–7.0)

## 2022-02-07 MED ORDER — NITROFURANTOIN MONOHYD MACRO 100 MG PO CAPS: ORAL_CAPSULE | ORAL | 0 refills | Status: DC

## 2022-02-07 NOTE — Progress Notes (Signed)
<><><><><><><><><><><><><><><><><><><><><><><><><><><><><><><><><> ?<><><><><><><><><><><><><><><><><><><><><><><><><><><><><><><><><> ?-   Test results slightly outside the reference range are not unusual. ?If there is anything important, I will review this with you,  ?otherwise it is considered normal test values.  ?If you have further questions,  ?please do not hesitate to contact me at the office or via My Chart.  ?<><><><><><><><><><><><><><><><><><><><><><><><><><><><><><><><><> ?<><><><><><><><><><><><><><><><><><><><><><><><><><><><><><><><><> ? ?-  U/A suspect for UTI , So ordered a urine culture & will take 2-4 day to get results ? ?- Sent in Rx for Antibiotic to [facility] to start on,  ?                                                        but may need to change pending the culture results.  ?<><><><><><><><><><><><><><><><><><><><><><><><><><><><><><><><><> ?<><><><><><><><><><><><><><><><><><><><><><><><><><><><><><><><><> ? ?- Uric Acid / gout test - Normal & OK  ?<><><><><><><><><><><><><><><><><><><><><><><><><><><><><><><><><> ?<><><><><><><><><><><><><><><><><><><><><><><><><><><><><><><><><> ? ?- Total Chol = 127   & LDL 57 - Both  Excellent  ? ?- Very low risk for Heart Attack  / Stroke ?============================================================ ?============================================================ ? ?-  A1c - Normal - No Diabetes - Great  !  ?<><><><><><><><><><><><><><><><><><><><><><><><><><><><><><><><><> ?<><><><><><><><><><><><><><><><><><><><><><><><><><><><><><><><><> ? ?- Vitamin D - 73 - Excellent - please keep dose same  ?<><><><><><><><><><><><><><><><><><><><><><><><><><><><><><><><><> ?<><><><><><><><><><><><><><><><><><><><><><><><><><><><><><><><><> ? ?- All Else - CBC - Kidneys - Electrolytes - Liver - Magnesium & Thyroid   ? ?- all  Normal /  OK ?<><><><><><><><><><><><><><><><><><><><><><><><><><><><><><><><><> ?<><><><><><><><><><><><><><><><><><><><><><><><><><><><><><><><><> ? ?- Keep up the [place] Work !  ? ?<><><><><><><><><><><><><><><><><><><><><><><><><><><><><><><><><> ?<><><><><><><><><><><><><><><><><><><><><><><><><><><><><><><><><> ? ? ? ? ? ? ? ? ? ? ? ? ? ? ? ? ? ? ? ? ? ? ? ? ? ? ? ?

## 2022-02-07 NOTE — Telephone Encounter (Signed)
Patient called to cancel follow up appointment with Dr. Baruch Gouty. She has been examined by her other physician. She did not state whether she wanted to continue to follow with Dr. Baruch Gouty. ?

## 2022-02-09 DIAGNOSIS — J301 Allergic rhinitis due to pollen: Secondary | ICD-10-CM | POA: Diagnosis not present

## 2022-02-10 ENCOUNTER — Other Ambulatory Visit: Payer: Self-pay | Admitting: Internal Medicine

## 2022-02-10 DIAGNOSIS — N3000 Acute cystitis without hematuria: Secondary | ICD-10-CM

## 2022-02-10 LAB — URINE CULTURE
MICRO NUMBER:: 13129320
SPECIMEN QUALITY:: ADEQUATE

## 2022-02-10 MED ORDER — AMOXICILLIN-POT CLAVULANATE 875-125 MG PO TABS: ORAL_TABLET | ORAL | 0 refills | Status: DC

## 2022-02-10 NOTE — Progress Notes (Signed)
<><><><><><><><><><><><><><><><><><><><><><><><><><><><><><><><><> ?<><><><><><><><><><><><><><><><><><><><><><><><><><><><><><><><><> ?-   Test results slightly outside the reference range are not unusual. ?If there is anything important, I will review this with you,  ?otherwise it is considered normal test values.  ?If you have further questions,  ?please do not hesitate to contact me at the office or via My Chart.  ?<><><><><><><><><><><><><><><><><><><><><><><><><><><><><><><><><> ?<><><><><><><><><><><><><><><><><><><><><><><><><><><><><><><><><> ? ?-  Urine culture (+) positive for a Klebsiella bacteria which is sometimes a difficult Infection  &  in your case only one of the oral antibiotics (Nitrofurantoin = g Macrobid) can be used.  ? ?- The other possible choices are injectable shots  which would be almost impossible to take shots 1 to 3 x /day for 7 to 10 days  ? ?- So Macrobid  capsules 2 x /day for 10 days usually works well for this infection  - Will send in Rx to your Drug store  ? ?- Also very important to drink lots of fluids / liquids ? ?- I usually also recommend to take Vitamin C 1,000 mg  2 x /day, since Vit C is an acid and acidifies the urine to hopefully prevent bacteria growing in the urine ? ?- Please call the office to schedule a Nurse visit in about a month ( middle of April)  to recheck your urine for infection to assure the infection is cleared up.  ? ? ? ? ?

## 2022-02-13 ENCOUNTER — Ambulatory Visit: Payer: Medicare Other | Admitting: Radiation Oncology

## 2022-02-21 DIAGNOSIS — J300 Vasomotor rhinitis: Secondary | ICD-10-CM | POA: Diagnosis not present

## 2022-02-21 DIAGNOSIS — K219 Gastro-esophageal reflux disease without esophagitis: Secondary | ICD-10-CM | POA: Diagnosis not present

## 2022-02-21 DIAGNOSIS — R053 Chronic cough: Secondary | ICD-10-CM | POA: Diagnosis not present

## 2022-02-28 ENCOUNTER — Encounter: Payer: Self-pay | Admitting: Internal Medicine

## 2022-03-01 ENCOUNTER — Ambulatory Visit (INDEPENDENT_AMBULATORY_CARE_PROVIDER_SITE_OTHER): Payer: Medicare Other

## 2022-03-01 DIAGNOSIS — N3000 Acute cystitis without hematuria: Secondary | ICD-10-CM

## 2022-03-01 NOTE — Progress Notes (Signed)
Patient presents today for a nurse visit to recheck her urine. Patient sates she is feeling good and has no symptoms.  ?

## 2022-03-02 LAB — URINALYSIS, ROUTINE W REFLEX MICROSCOPIC
Bilirubin Urine: NEGATIVE
Glucose, UA: NEGATIVE
Hgb urine dipstick: NEGATIVE
Ketones, ur: NEGATIVE
Leukocytes,Ua: NEGATIVE
Nitrite: NEGATIVE
Protein, ur: NEGATIVE
Specific Gravity, Urine: 1.023 (ref 1.001–1.035)
pH: 5.5 (ref 5.0–8.0)

## 2022-03-02 LAB — URINE CULTURE
MICRO NUMBER:: 13226850
SPECIMEN QUALITY:: ADEQUATE

## 2022-03-02 NOTE — Progress Notes (Signed)
<><><><><><><><><><><><><><><><><><><><><><><><> ?<><><><><><><><><><><><><><><><><><><><><><><><> ? ?-    Urine appears cleared up & no sign of Infection  - Great !  ? ?<><><><><><><><><><><><><><><><><><><><><><><><> ?<><><><><><><><><><><><><><><><><><><><><><><><> ? ? ? ?

## 2022-03-03 ENCOUNTER — Encounter: Payer: Self-pay | Admitting: Internal Medicine

## 2022-03-06 ENCOUNTER — Encounter: Payer: Self-pay | Admitting: Internal Medicine

## 2022-03-20 ENCOUNTER — Ambulatory Visit: Payer: Medicare Other | Admitting: Dermatology

## 2022-05-09 ENCOUNTER — Ambulatory Visit (INDEPENDENT_AMBULATORY_CARE_PROVIDER_SITE_OTHER): Payer: Medicare Other | Admitting: Podiatry

## 2022-05-09 DIAGNOSIS — G5761 Lesion of plantar nerve, right lower limb: Secondary | ICD-10-CM | POA: Diagnosis not present

## 2022-05-09 DIAGNOSIS — R52 Pain, unspecified: Secondary | ICD-10-CM

## 2022-05-09 MED ORDER — BETAMETHASONE SOD PHOS & ACET 6 (3-3) MG/ML IJ SUSP: 3.0000 mg | Freq: Once | INTRAMUSCULAR | Status: DC

## 2022-05-09 MED ORDER — METHYLPREDNISOLONE 4 MG PO TBPK: ORAL_TABLET | ORAL | 0 refills | Status: DC

## 2022-05-09 MED ORDER — MELOXICAM 7.5 MG PO TABS: 7.5000 mg | ORAL_TABLET | Freq: Every day | ORAL | 0 refills | Status: DC

## 2022-05-09 NOTE — Progress Notes (Signed)
   HPI: 71 y.o. female presenting today for new complaint of pain to the second and third intermetatarsal spaces.  Patient states that she has noticed over the last 2 weeks increased pain and tenderness to this area.  She denies a history of injury.  She does have a history of surgery to the right forefoot.  She has been experiencing aching pain and she noticed that the third and fourth digits are actually splaying/spreading apart.  She is very concerned and presents for further treatment and evaluation  Past Medical History:  Diagnosis Date   Allergy    Anxiety    Bell palsy 02/04/2016   Left   Cancer ([place]) 2011   [place]   Carpal tunnel syndrome on both sides 0/17/7939   Complication of anesthesia    Hyperlipidemia    Hypertension 2005   Plantar fasciitis    PONV (postoperative nausea and vomiting)     Past Surgical History:  Procedure Laterality Date   ABDOMINAL HYSTERECTOMY  1990   basal cell carcinoma  2011   nose   BREAST LUMPECTOMY WITH RADIOACTIVE SEED LOCALIZATION Left 12/05/2019   Procedure: LEFT BREAST LUMPECTOMY WITH RADIOACTIVE SEED LOCALIZATION;  Surgeon: Alphonsa Overall, MD;  Location: [facility];  Service: General;  Laterality: Left;   TENDON REPAIR     plantar fasciitis   TONSILLECTOMY      No Known Allergies   Physical Exam: General: The patient is alert and oriented x3 in no acute distress.  Dermatology: Skin is warm, dry and supple bilateral lower extremities. Negative for open lesions or macerations.  Vascular: Palpable pedal pulses bilaterally. Capillary refill within normal limits.  Negative for any significant edema or erythema  Neurological: Light touch and protective threshold grossly intact  Musculoskeletal Exam: Splay toe deformity noted between the third and fourth digits of the right foot.  There is also pain on palpation to the second and third intermetatarsal spaces.  Pain with lateral compression of the metatarsal heads.  Consistent  with findings of Morton's neuroma  Assessment: 1.  Morton's neuroma second and third intermetatarsal spaces right foot 2.  Splay toe deformity third and fourth digits right foot   Plan of Care:  1. Patient evaluated.  2. Injection of 0.5cc Celestone Soluspan injected into the second and third intermetatarsal spaces of the right foot.  3. Rx Medrol Dosepak 4. Rx Meloxicam 7.'5mg'$  daily PRN 5. Continue metatarsal pads 6. Return to clinic 4 weeks     Edrick Kins, DPM [facility]  Dr. Edrick Kins, DPM    [address], [place] [address]                Office [phone]  Fax [phone]

## 2022-06-09 ENCOUNTER — Ambulatory Visit: Payer: Medicare Other | Admitting: Podiatry

## 2022-06-13 ENCOUNTER — Ambulatory Visit (INDEPENDENT_AMBULATORY_CARE_PROVIDER_SITE_OTHER): Payer: Medicare Other | Admitting: Podiatry

## 2022-06-13 DIAGNOSIS — M7671 Peroneal tendinitis, right leg: Secondary | ICD-10-CM

## 2022-06-13 DIAGNOSIS — G5761 Lesion of plantar nerve, right lower limb: Secondary | ICD-10-CM | POA: Diagnosis not present

## 2022-06-13 MED ORDER — BETAMETHASONE SOD PHOS & ACET 6 (3-3) MG/ML IJ SUSP
3.0000 mg | Freq: Once | INTRAMUSCULAR | Status: AC
  Administered 2022-06-13: 3 mg via INTRA_ARTICULAR

## 2022-06-13 NOTE — Progress Notes (Signed)
   HPI: 71 y.o. female presenting today for follow-up evaluation of a Morton's neuroma to the right foot.  Patient states that the injection helped significantly.  The pain has decreased.  She did wear the cam boot for a few weeks but she has noticed pain and tenderness along the peroneal tendon which she believes was caused by the cam boot.  She discontinued the cam boot about 3 weeks ago but she continues to have pain and tenderness to the lateral aspect of the right ankle.  Past Medical History:  Diagnosis Date   Allergy    Anxiety    Bell palsy 02/04/2016   Left   Cancer ([place]) 2011   [place]   Carpal tunnel syndrome on both sides 4/97/0263   Complication of anesthesia    Hyperlipidemia    Hypertension 2005   Plantar fasciitis    PONV (postoperative nausea and vomiting)     Past Surgical History:  Procedure Laterality Date   ABDOMINAL HYSTERECTOMY  1990   basal cell carcinoma  2011   nose   BREAST LUMPECTOMY WITH RADIOACTIVE SEED LOCALIZATION Left 12/05/2019   Procedure: LEFT BREAST LUMPECTOMY WITH RADIOACTIVE SEED LOCALIZATION;  Surgeon: Alphonsa Overall, MD;  Location: [facility];  Service: General;  Laterality: Left;   TENDON REPAIR     plantar fasciitis   TONSILLECTOMY      No Known Allergies   Physical Exam: General: The patient is alert and oriented x3 in no acute distress.  Dermatology: Skin is warm, dry and supple bilateral lower extremities. Negative for open lesions or macerations.  Vascular: Palpable pedal pulses bilaterally. Capillary refill within normal limits.  Negative for any significant edema or erythema  Neurological: Light touch and protective threshold grossly intact  Musculoskeletal Exam: Splay toe deformity noted between the third and fourth digits of the right foot.  There is also pain on palpation to the second and third intermetatarsal spaces.  Pain with lateral compression of the metatarsal heads.  Consistent with findings of Morton's  neuroma Today the patient is also experiencing pain with palpation along the peroneal tendon right just proximal to the lateral malleolus  Assessment: 1.  Morton's neuroma second and third intermetatarsal spaces right foot 2.  Splay toe deformity third and fourth digits right foot 3.  Peroneal tendinitis right   Plan of Care:  1. Patient evaluated.  2. Injection of 0.5cc Celestone Soluspan injected into the second and third intermetatarsal spaces of the right foot as well as along the peroneal tendon sheath proximal to the ankle joint lateral aspect of the right ankle.  3.  Continue meloxicam 7.'5mg'$  daily PRN 4.  Today the patient is wearing sketchers arch fit sandals.  Continue good supportive shoes and sandals 5.  Return to clinic as needed     Edrick Kins, DPM [facility]  Dr. Edrick Kins, DPM    [address], [place] [address]                Office [phone]  Fax [phone]

## 2022-07-06 ENCOUNTER — Other Ambulatory Visit: Payer: Self-pay | Admitting: Nurse Practitioner

## 2022-07-06 DIAGNOSIS — E782 Mixed hyperlipidemia: Secondary | ICD-10-CM

## 2022-07-12 ENCOUNTER — Ambulatory Visit: Payer: Medicare Other | Admitting: Dermatology

## 2022-08-14 ENCOUNTER — Ambulatory Visit: Payer: Medicare Other | Admitting: Nurse Practitioner

## 2022-08-18 DIAGNOSIS — M5136 Other intervertebral disc degeneration, lumbar region: Secondary | ICD-10-CM | POA: Diagnosis not present

## 2022-08-18 DIAGNOSIS — M7061 Trochanteric bursitis, right hip: Secondary | ICD-10-CM | POA: Diagnosis not present

## 2022-09-04 ENCOUNTER — Other Ambulatory Visit: Payer: Self-pay | Admitting: Internal Medicine

## 2022-09-04 ENCOUNTER — Encounter: Payer: Self-pay | Admitting: Internal Medicine

## 2022-09-04 DIAGNOSIS — Z79899 Other long term (current) drug therapy: Secondary | ICD-10-CM

## 2022-09-04 MED ORDER — PHENTERMINE HCL 37.5 MG PO TABS: ORAL_TABLET | ORAL | 1 refills | Status: DC

## 2022-09-05 DIAGNOSIS — R053 Chronic cough: Secondary | ICD-10-CM | POA: Diagnosis not present

## 2022-09-05 DIAGNOSIS — J310 Chronic rhinitis: Secondary | ICD-10-CM | POA: Diagnosis not present

## 2022-09-05 DIAGNOSIS — J300 Vasomotor rhinitis: Secondary | ICD-10-CM | POA: Diagnosis not present

## 2022-09-05 DIAGNOSIS — K219 Gastro-esophageal reflux disease without esophagitis: Secondary | ICD-10-CM | POA: Diagnosis not present

## 2022-09-09 ENCOUNTER — Other Ambulatory Visit: Payer: Self-pay | Admitting: Internal Medicine

## 2022-09-09 ENCOUNTER — Encounter: Payer: Self-pay | Admitting: Internal Medicine

## 2022-09-09 DIAGNOSIS — U071 COVID-19: Secondary | ICD-10-CM

## 2022-09-09 MED ORDER — BENZONATATE 200 MG PO CAPS: ORAL_CAPSULE | ORAL | 1 refills | Status: DC

## 2022-09-09 MED ORDER — PROMETHAZINE-DM 6.25-15 MG/5ML PO SYRP: ORAL_SOLUTION | ORAL | 1 refills | Status: DC

## 2022-09-09 MED ORDER — MOLNUPIRAVIR EUA 200MG CAPSULE: ORAL_CAPSULE | ORAL | 0 refills | Status: DC

## 2022-09-09 MED ORDER — DEXAMETHASONE 4 MG PO TABS: ORAL_TABLET | ORAL | 0 refills | Status: DC

## 2022-09-11 ENCOUNTER — Encounter: Payer: Self-pay | Admitting: Internal Medicine

## 2022-09-14 ENCOUNTER — Ambulatory Visit: Payer: Medicare Other | Admitting: Nurse Practitioner

## 2022-09-21 ENCOUNTER — Other Ambulatory Visit: Payer: Self-pay | Admitting: Internal Medicine

## 2022-09-21 ENCOUNTER — Encounter: Payer: Self-pay | Admitting: Internal Medicine

## 2022-09-21 MED ORDER — NYSTATIN 100000 UNIT/ML MT SUSP: OROMUCOSAL | 3 refills | Status: DC

## 2022-10-04 NOTE — Progress Notes (Unsigned)
MEDICARE ANNUAL WELLNESS VISIT AND FOLLOW UP  Assessment:    Diagnoses and all orders for this visit:  Encounter for Medicare annual wellness exam Due 1 year  Hypertension, benign essential, goal below 140/90 Continue medication Monitor blood pressure at home; call if consistently over 130/80 Continue DASH diet.   Reminder to go to the ER if any CP, SOB, nausea, dizziness, severe HA, changes vision/speech, left arm numbness and tingling and jaw pain. CBC, CMP  Fatty liver (2005 by U/S) Maintain healthy weight, avoid alcohol/tylenol, will monitor LFTs  Gout of big toe Continue allopurinol Diet discussed Check uric acid as needed  Prediabetes Discussed disease and risks Discussed diet/exercise, weight management  A1C q72m due  Mixed hyperlipidemia Continue medications Continue low cholesterol diet and exercise.  Check lipid panel.  TSH  Bmi -23 Continue diet and exercise  Vitamin D deficiency Continue Vit D supplementation to maintain value in therapeutic level of 60-100   Anxiety Rare use of xanax  Stress management techniques discussed, increase water, good sleep hygiene discussed, increase exercise, and increase veggies.   Environmental allergies Continue OTC allergy pills Continue to follow with Dr. RConstance Holster DCIS of L breast Follows with oncology, [id number] up S/p lumpectomy, completed radiation, intolerant of tamoxifen  Last mammogram 12/21 negative  Over 30 minutes of exam, counseling, chart review, and critical decision making was performed  Future Appointments  Date Time Provider [place]11/07/2022 10:30 AM WiAlycia RossettiNP [facility] None  10/09/2022  3:15 PM KoRalene BatheMD [facility] None  01/22/2023  8:30 AM GuNicholas LoseMD [facility] None  02/12/2023 11:00 AM McUnk PintoMD [facility] None    Plan:   During the course of the visit the patient was educated and counseled about appropriate screening and preventive  services including:   Pneumococcal vaccine  Influenza vaccine Td vaccine Prevnar 13 Screening electrocardiogram Screening mammography Bone densitometry screening Colorectal cancer screening Diabetes screening Glaucoma screening Nutrition counseling  Advanced directives: given info/requested copies   Subjective:   Tanya RASHADs a [age].o. female who presents for annual medicare visit and follow on on chronic medical conditions.    She is followed by ortho Dr. EvAmalia Haileyor her feet, [facility].   She has sinus and allergy issues, getting injections by allergist Dr. RoConstance Holster Was questioning GERD and put on Omeprazole x 1 month as well as no coffee/chocolate but has noticed no changes in cough, post nasal drip. Has follow up appointment this week.  She was diagnosed with DCIS Lt breast on 11/13/2019. Underwent L breast Lumpectomy on Dec 05, 2019 by Dr NeLucia GaskinsWas on tamoxifen by Dr. GuLindi Adieut couldn't tolerate and was d/c'd, close monitoring q6m60mternating with surgeon, and completed adjuvent radiation treatments by Dr. ChrBaruch Goutyammogram 10/2020 was benign repeat 1 year.   She has hx of anxiety, currently prescribed xanax, takes occasionally.   BMI is There is no height or weight on file to calculate BMI., she is working on diet and exercise. Wt Readings from Last 3 Encounters:  03/01/22 151 lb 9.6 oz (68.8 kg)  02/06/22 150 lb 3.2 oz (68.1 kg)  01/18/22 152 lb 12.8 oz (69.3 kg)   Her blood pressure has been controlled at home, today their BP is   BP Readings from Last 3 Encounters:  03/01/22 104/62  02/06/22 130/82  01/18/22 (!) 147/68   She does workout. She denies chest pain, shortness of breath,  dizziness.   She is on cholesterol medication (pravastatin 20 mg three days a week, fenofibrate) and denies myalgias. Her cholesterol is not at goal. The cholesterol last visit was:   Lab Results  Component Value Date   CHOL 127  02/06/2022   HDL 52 02/06/2022   LDLCALC 57 02/06/2022   TRIG 95 02/06/2022   CHOLHDL 2.4 02/06/2022   She has been working on diet and exercise for prediabetes, and denies foot ulcerations, hyperglycemia, hypoglycemia , increased appetite, nausea, paresthesia of the feet, polydipsia, polyuria, visual disturbances, vomiting and weight loss. Last A1C in the office was:  Lab Results  Component Value Date   HGBA1C 5.6 02/06/2022     Last GFR Lab Results  Component Value Date   GFRNONAA 48 (L) 01/04/2021   Patient is on Vitamin D supplement. Lab Results  Component Value Date   VD25OH 73 02/06/2022     Patient is on allopurinol for gout and does not report a recent flare.  Lab Results  Component Value Date   LABURIC 4.2 02/06/2022   LFTs monitored closely due to hx of fatty liver per [place] in 2005, improved with recent weight loss.  Lab Results  Component Value Date   ALT 16 02/06/2022   AST 21 02/06/2022   ALKPHOS 76 11/19/2019   BILITOT 0.8 02/06/2022     Medication Review Current Outpatient Medications on File Prior to Visit  Medication Sig Dispense Refill   allopurinol (ZYLOPRIM) 300 MG tablet TAKE 1 TABLET DAILY FOR GOUT PREVENTION 90 tablet 1   Ascorbic Acid (VITAMIN C) 1000 MG tablet Take 1,000 mg by mouth daily.     aspirin EC 81 MG tablet Take 1 tablet (81 mg total) by mouth daily.     benzonatate (TESSALON) 200 MG capsule Take 1 perle 3 x / day to prevent cough 30 capsule 1   Cholecalciferol (VITAMIN D PO) Take 10,000 Units by mouth daily.     Cholecalciferol 25 MCG (1000 UT) tablet Take by mouth.     Cyanocobalamin (VITAMIN B-12 PO) Take 1 tablet by mouth daily.     cyanocobalamin 1000 MCG tablet Take by mouth.     dexamethasone (DECADRON) 4 MG tablet Take 1 tab 3 x day - 3 days, then 2 x day - 3 days, then 1 tab daily 20 tablet 0   EPINEPHrine 0.3 mg/0.3 mL IJ SOAJ injection INJECT 1 PEN IN THE MUSCLE ONE TIME AS DIRECTED     fenofibrate (TRICOR) 145 MG tablet  Take  1 tablet  Daily for Triglycerides  (Blood Fats)                                                    /              TAKE                          BY                    MOUTH                   ONCE DAILY 90 tablet 3   fluticasone (FLONASE) 50 MCG/ACT nasal spray Use 1 to 2 sprays each Nares 1 to 2 x /day 48 g 3  loratadine (CLARITIN) 10 MG tablet Take 10 mg by mouth daily.     magic mouthwash (nystatin, hydrocortisone, diphenhydrAMINE) suspension Take  1 teaspoon (5 ml)   Swish & Swallow  every 2 hours for mouth ulcers 240 mL 3   meloxicam (MOBIC) 7.5 MG tablet Take 1 tablet (7.5 mg total) by mouth daily. 30 tablet 0   molnupiravir EUA (LAGEVRIO) 200 mg CAPS capsule Take 4 capsules  2 x /day (every 12 hours) for 5 days for Covid Infection 40 capsule 0   omeprazole (PRILOSEC) 40 MG capsule Take by mouth.     phentermine (ADIPEX-P) 37.5 MG tablet Take  1 tablet  every Morning  for Dieting & Weight Loss 90 tablet 1   promethazine-dextromethorphan (PROMETHAZINE-DM) 6.25-15 MG/5ML syrup Take 1 tsp every 4 hours if needed for cough 240 mL 1   pyridOXINE (VITAMIN B-6) 100 MG tablet Take 100 mg by mouth daily.     traZODone (DESYREL) 50 MG tablet 1/2-2 tablet as needed for sleep 30 tablet 0   triamterene-hydrochlorothiazide (MAXZIDE-25) 37.5-25 MG tablet Take 1/2 to 1 tablet Daily for BP & Fluid Retention 90 tablet 3   zinc gluconate 50 MG tablet Take 50 mg by mouth daily.     No current facility-administered medications on file prior to visit.    Allergies: No Known Allergies  Current Problems (verified) has Hypertension, benign essential, goal below 140/90; Hyperlipidemia; Environmental allergies; Anxiety; History of Bell's palsy; Prediabetes; BMI 23.0-23.9, adult; Gout of big toe; Fatty liver (2005 by U/S); Family history of cardiac disorder in mother; and Ductal carcinoma in situ (DCIS) of left breast on their problem list.  Screening Tests Immunization History  Administered Date(s)  Administered   Influenza,inj,quad, With Preservative 08/27/2017   Influenza-Unspecified 10/02/2018, 10/27/2019   PFIZER Comirnaty(Gray Top)Covid-19 Tri-Sucrose Vaccine 09/05/2020, 06/07/2021   PFIZER(Purple Top)SARS-COV-2 Vaccination 01/03/2020, 01/24/2020   PPD Test 05/14/2014   Pneumococcal Polysaccharide-23 02/08/2017    Preventative care: Last colonoscopy: Mar 27 2016, Dr. Watt Climes scheduled for preop 07/2021 Last mammogram:  11/16/20 benign repeat 1 year Last pap smear/pelvic exam: Hysterctomy, 2014 DEXA: - discussed with oncologist to schedule with mammogram, declines today   Prior vaccinations: TD or Tdap: 2011 declines  Influenza: Declined, reaction  Pneumococcal:2018 Prevnar13: declines today  Shingles/Zostavax: 2014 Shingrix: 4 years ago 2 vaccines Covid 19: 2/2, 2021, 09/05/20, 06/07/21 pfizer   Names of Other Physician/Practitioners you currently use: 1. [facility]- here for primary care 2. [employer], last visit 2022 3. Dr. Imagene Riches,  dentist, last visit 2022  Patient Care Team: Unk Pinto, MD as PCP - General (Internal Medicine) Avon Gully, NP as Nurse Practitioner (Obstetrics and Gynecology) Clarene Essex, MD as Consulting Physician (Gastroenterology) Suella Broad, MD as Consulting Physician (Physical Medicine and Rehabilitation) Jari Pigg, MD as Consulting Physician (Dermatology) Nicholas Lose, MD as Consulting Physician (Hematology and Oncology) Alphonsa Overall, MD as Consulting Physician (General Surgery) Noreene Filbert, MD as Referring Physician (Radiation Oncology)  Surgical: She  has a past surgical history that includes Abdominal hysterectomy (1990); Tonsillectomy; Tendon repair; basal cell carcinoma (2011); and Breast lumpectomy with radioactive seed localization (Left, 12/05/2019). Family Her family history includes Arthritis in her mother; Cancer in her brother and father; Heart attack in her  mother; Heart disease (age of onset: 77) in her mother; Hyperlipidemia in her mother; Hypertension in her brother and mother; Parkinson's disease in her brother; Stroke in her mother. Social history  She reports that she has never smoked. She  has never used smokeless tobacco. She reports current alcohol use. She reports that she does not use drugs.  MEDICARE WELLNESS OBJECTIVES: Physical activity:   Cardiac risk factors:   Depression/mood screen:      07/25/2021   10:54 AM  Depression screen PHQ 2/9  Decreased Interest 0  Down, Depressed, Hopeless 0  PHQ - 2 Score 0    ADLs:      No data to display            Cognitive Testing  Alert? Yes  Normal Appearance?Yes  Oriented to person? Yes  Place? Yes   Time? Yes  Recall of three objects?  Yes  Can perform simple calculations? Yes  Displays appropriate judgment?Yes  Can read the correct time from a watch face?Yes  EOL planning:     Objective:   There were no vitals filed for this visit.   Wt Readings from Last 3 Encounters:  03/01/22 151 lb 9.6 oz (68.8 kg)  02/06/22 150 lb 3.2 oz (68.1 kg)  01/18/22 152 lb 12.8 oz (69.3 kg)    There is no height or weight on file to calculate BMI.  General appearance: alert, no distress, WD/WN,  female HEENT: normocephalic, sclerae anicteric, TMs pearly, nares patent, no discharge or erythema, pharynx normal Oral cavity: MMM, no lesions Neck: supple, no lymphadenopathy, no thyromegaly, no masses Heart: RRR, normal S1, S2, no murmurs Lungs: CTA bilaterally, no wheezes, rhonchi, or rales Abdomen: +bs, soft, non tender, non distended, no masses, no hepatomegaly, no splenomegaly Musculoskeletal: nontender, no swelling, no obvious deformity Extremities: no edema, no cyanosis, no clubbing Pulses: 2+ symmetric, upper and lower extremities, normal cap refill Neurological: alert, oriented x 3, CN2-12 intact, strength normal upper extremities and lower extremities, sensation normal  throughout, DTRs 2+ throughout, no cerebellar signs, gait normal Psychiatric: normal affect, behavior normal, pleasant  Breast: defer Gyn: defer Rectal: defer   Medicare Attestation I have personally reviewed: The patient's medical and social history Their use of alcohol, tobacco or illicit drugs Their current medications and supplements The patient's functional ability including ADLs,fall risks, home safety risks, cognitive, and hearing and visual impairment Diet and physical activities Evidence for depression or mood disorders  The patient's weight, height, BMI, and visual acuity have been recorded in the chart.  I have made referrals, counseling, and provided education to the patient based on review of the above and I have provided the patient with a written personalized care plan for preventive services.     Magda Bernheim ANP-C  [facility]  10/04/2022

## 2022-10-05 ENCOUNTER — Ambulatory Visit: Payer: Medicare Other | Admitting: Nurse Practitioner

## 2022-10-05 ENCOUNTER — Ambulatory Visit (INDEPENDENT_AMBULATORY_CARE_PROVIDER_SITE_OTHER): Payer: Medicare Other | Admitting: Nurse Practitioner

## 2022-10-05 ENCOUNTER — Encounter: Payer: Self-pay | Admitting: Nurse Practitioner

## 2022-10-05 VITALS — BP 114/62 | HR 83 | Temp 97.7°F | Ht 67.0 in | Wt 152.4 lb

## 2022-10-05 DIAGNOSIS — M62838 Other muscle spasm: Secondary | ICD-10-CM | POA: Diagnosis not present

## 2022-10-05 DIAGNOSIS — Z79899 Other long term (current) drug therapy: Secondary | ICD-10-CM | POA: Diagnosis not present

## 2022-10-05 DIAGNOSIS — Z0001 Encounter for general adult medical examination with abnormal findings: Secondary | ICD-10-CM | POA: Diagnosis not present

## 2022-10-05 DIAGNOSIS — K760 Fatty (change of) liver, not elsewhere classified: Secondary | ICD-10-CM | POA: Diagnosis not present

## 2022-10-05 DIAGNOSIS — F419 Anxiety disorder, unspecified: Secondary | ICD-10-CM

## 2022-10-05 DIAGNOSIS — K219 Gastro-esophageal reflux disease without esophagitis: Secondary | ICD-10-CM

## 2022-10-05 DIAGNOSIS — Z9109 Other allergy status, other than to drugs and biological substances: Secondary | ICD-10-CM

## 2022-10-05 DIAGNOSIS — M1A9XX0 Chronic gout, unspecified, without tophus (tophi): Secondary | ICD-10-CM

## 2022-10-05 DIAGNOSIS — R6889 Other general symptoms and signs: Secondary | ICD-10-CM | POA: Diagnosis not present

## 2022-10-05 DIAGNOSIS — E782 Mixed hyperlipidemia: Secondary | ICD-10-CM

## 2022-10-05 DIAGNOSIS — Z780 Asymptomatic menopausal state: Secondary | ICD-10-CM

## 2022-10-05 DIAGNOSIS — I10 Essential (primary) hypertension: Secondary | ICD-10-CM

## 2022-10-05 DIAGNOSIS — D0512 Intraductal carcinoma in situ of left breast: Secondary | ICD-10-CM

## 2022-10-05 DIAGNOSIS — R7309 Other abnormal glucose: Secondary | ICD-10-CM

## 2022-10-05 DIAGNOSIS — Z6823 Body mass index (BMI) 23.0-23.9, adult: Secondary | ICD-10-CM

## 2022-10-05 DIAGNOSIS — Z0000 Encounter for general adult medical examination without abnormal findings: Secondary | ICD-10-CM

## 2022-10-05 DIAGNOSIS — E559 Vitamin D deficiency, unspecified: Secondary | ICD-10-CM

## 2022-10-05 DIAGNOSIS — E2839 Other primary ovarian failure: Secondary | ICD-10-CM

## 2022-10-05 MED ORDER — ESOMEPRAZOLE MAGNESIUM 40 MG PO CPDR: 40.0000 mg | DELAYED_RELEASE_CAPSULE | Freq: Every day | ORAL | 3 refills | Status: DC

## 2022-10-05 NOTE — Patient Instructions (Signed)
Muscle Cramps and Spasms Muscle cramps and spasms are when muscles tighten by themselves. They usually get better within minutes. Muscle cramps are painful. They are usually stronger and last longer than muscle spasms. Muscle spasms may or may not be painful. They can last a few seconds or much longer. Cramps and spasms can affect any muscle, but they occur most often in the calf muscles of the leg. They are usually not caused by a serious problem. In many cases, the cause is not known. Some common causes include: Doing more physical work or exercise than your body is ready for. Using the muscles too much (overuse) by repeating certain movements too many times. Staying in a certain position for a long time. Playing a sport or doing an activity without preparing properly. Using bad form or technique while playing a sport or doing an activity. Not having enough water in your body (dehydration). Injury. Side effects of some medicines. Low levels of the salts and minerals in your blood (electrolytes), such as low potassium or calcium. Follow these instructions at home: Managing pain and stiffness     Massage, stretch, and relax the muscle. Do this for many minutes at a time. If told, put heat on tight or tense muscles as often as told by your doctor. Use the heat source that your doctor recommends, such as a moist heat pack or a heating pad. Place a towel between your skin and the heat source. Leave the heat on for 20-30 minutes. Remove the heat if your skin turns bright red. This is very important if you are not able to feel pain, heat, or cold. You may have a greater risk of getting burned. If told, put ice on the affected area. This may help if you are sore or have pain after a cramp or spasm. Put ice in a plastic bag. Place a towel between your skin and the bag. Leave the ice on for 20 minutes, 2-3 times a day. Try taking hot showers or baths to help relax tight muscles. Eating and  drinking Drink enough fluid to keep your pee (urine) pale yellow. Eat a healthy diet to help ensure that your muscles work well. This should include: Fruits and vegetables. Lean protein. Whole grains. Low-fat or nonfat dairy products. General instructions If you are having cramps often, avoid intense exercise for several days. Take over-the-counter and prescription medicines only as told by your doctor. Watch for any changes in your symptoms. Keep all follow-up visits as told by your doctor. This is important. Contact a doctor if: Your cramps or spasms get worse or happen more often. Your cramps or spasms do not get better with time. Summary Muscle cramps and spasms are when muscles tighten by themselves. They usually get better within minutes. Cramps and spasms occur most often in the calf muscles of the leg. Massage, stretch, and relax the muscle. This may help the cramp or spasm go away. Drink enough fluid to keep your pee (urine) pale yellow. This information is not intended to replace advice given to you by your health care provider. Make sure you discuss any questions you have with your health care provider. Document Revised: 06/03/2021 Document Reviewed: 06/03/2021 Elsevier Patient Education  [place].

## 2022-10-06 ENCOUNTER — Encounter: Payer: Self-pay | Admitting: Nurse Practitioner

## 2022-10-06 LAB — COMPLETE METABOLIC PANEL WITH GFR
AG Ratio: 2 (calc) (ref 1.0–2.5)
ALT: 22 U/L (ref 6–29)
AST: 24 U/L (ref 10–35)
Albumin: 4.5 g/dL (ref 3.6–5.1)
Alkaline phosphatase (APISO): 86 U/L (ref 37–153)
BUN: 20 mg/dL (ref 7–25)
CO2: 28 mmol/L (ref 20–32)
Calcium: 9.5 mg/dL (ref 8.6–10.4)
Chloride: 103 mmol/L (ref 98–110)
Creat: 0.87 mg/dL (ref 0.60–1.00)
Globulin: 2.3 g/dL (calc) (ref 1.9–3.7)
Glucose, Bld: 87 mg/dL (ref 65–99)
Potassium: 3.5 mmol/L (ref 3.5–5.3)
Sodium: 140 mmol/L (ref 135–146)
Total Bilirubin: 0.5 mg/dL (ref 0.2–1.2)
Total Protein: 6.8 g/dL (ref 6.1–8.1)
eGFR: 71 mL/min/{1.73_m2} (ref >=60)

## 2022-10-06 LAB — CBC WITH DIFFERENTIAL/PLATELET
Absolute Monocytes: 428 cells/uL (ref 200–950)
Basophils Absolute: 28 cells/uL (ref 0–200)
Basophils Relative: 0.6 %
Eosinophils Absolute: 368 cells/uL (ref 15–500)
Eosinophils Relative: 8 %
HCT: 39.7 % (ref 35.0–45.0)
Hemoglobin: 13.8 g/dL (ref 11.7–15.5)
Lymphs Abs: 1536 cells/uL (ref 850–3900)
MCH: 30.1 pg (ref 27.0–33.0)
MCHC: 34.8 g/dL (ref 32.0–36.0)
MCV: 86.5 fL (ref 80.0–100.0)
MPV: 9.2 fL (ref 7.5–12.5)
Monocytes Relative: 9.3 %
Neutro Abs: 2240 cells/uL (ref 1500–7800)
Neutrophils Relative %: 48.7 %
Platelets: 178 10*3/uL (ref 140–400)
RBC: 4.59 10*6/uL (ref 3.80–5.10)
RDW: 13 % (ref 11.0–15.0)
Total Lymphocyte: 33.4 %
WBC: 4.6 10*3/uL (ref 3.8–10.8)

## 2022-10-06 LAB — LIPID PANEL
Cholesterol: 153 mg/dL (ref <200)
HDL: 50 mg/dL (ref >=50)
LDL Cholesterol (Calc): 81 mg/dL (calc)
Non-HDL Cholesterol (Calc): 103 mg/dL (calc) (ref <130)
Total CHOL/HDL Ratio: 3.1 (calc) (ref <5.0)
Triglycerides: 127 mg/dL (ref <150)

## 2022-10-06 LAB — HEMOGLOBIN A1C
Hgb A1c MFr Bld: 5.8 % of total Hgb — ABNORMAL HIGH (ref <5.7)
Mean Plasma Glucose: 120 mg/dL
eAG (mmol/L): 6.6 mmol/L

## 2022-10-09 ENCOUNTER — Encounter: Payer: Self-pay | Admitting: Nurse Practitioner

## 2022-10-09 ENCOUNTER — Ambulatory Visit (INDEPENDENT_AMBULATORY_CARE_PROVIDER_SITE_OTHER): Payer: Medicare Other | Admitting: Dermatology

## 2022-10-09 DIAGNOSIS — L821 Other seborrheic keratosis: Secondary | ICD-10-CM | POA: Diagnosis not present

## 2022-10-09 DIAGNOSIS — L820 Inflamed seborrheic keratosis: Secondary | ICD-10-CM

## 2022-10-09 DIAGNOSIS — L578 Other skin changes due to chronic exposure to nonionizing radiation: Secondary | ICD-10-CM

## 2022-10-09 DIAGNOSIS — E2839 Other primary ovarian failure: Secondary | ICD-10-CM

## 2022-10-09 NOTE — Progress Notes (Signed)
   Follow-Up Visit   Subjective  [name] is a 71 y.o. female who presents for the following: Follow-up (Inflamed SK follow up - arms and neck treated with LN2) and Other (Spot on back that is itchy). The patient has spots, moles and lesions to be evaluated, some may be new or changing and the patient has concerns that these could be cancer.  The following portions of the chart were reviewed this encounter and updated as appropriate:   Tobacco  Allergies  Meds  Problems  Med Hx  Surg Hx  Fam Hx     Review of Systems:  No other skin or systemic complaints except as noted in HPI or Assessment and Plan.  Objective  Well appearing patient in no apparent distress; mood and affect are within normal limits.  A focused examination was performed including back, arms. Relevant physical exam findings are noted in the Assessment and Plan.  Right Upper Back x 2, arms x 22 (24) Erythematous stuck-on, waxy papule or plaque   Assessment & Plan   Actinic Damage - chronic, secondary to cumulative UV radiation exposure/sun exposure over time - diffuse scaly erythematous macules with underlying dyspigmentation - Recommend daily broad spectrum sunscreen SPF 30+ to sun-exposed areas, reapply every 2 hours as needed.  - Recommend staying in the shade or wearing long sleeves, sun glasses (UVA+UVB protection) and wide brim hats (4-inch brim around the entire circumference of the hat). - Call for new or changing lesions.  Seborrheic Keratoses - Stuck-on, waxy, tan-brown papules and/or plaques  - Benign-appearing - Discussed benign etiology and prognosis. - Observe - Call for any changes  Inflamed seborrheic keratosis (24) Right Upper Back x 2, arms x 22  Destruction of lesion - Right Upper Back x 2, arms x 22 Complexity: simple   Destruction method: cryotherapy   Informed consent: discussed and consent obtained   Timeout:  patient name, date of birth, surgical site, and procedure  verified Lesion destroyed using liquid nitrogen: Yes   Region frozen until ice ball extended beyond lesion: Yes   Outcome: patient tolerated procedure well with no complications   Post-procedure details: wound care instructions given     Return in about 3 months (around 01/09/2023) for ISK follow up.  I, Ashok Cordia, CMA, am acting as scribe for Sarina Ser, MD . Documentation: I have reviewed the above documentation for accuracy and completeness, and I agree with the above.  Sarina Ser, MD

## 2022-10-09 NOTE — Patient Instructions (Signed)
Cryotherapy Aftercare  Wash gently with soap and water everyday.   Apply Vaseline and Band-Aid daily until healed.     Due to recent changes in healthcare laws, you may see results of your pathology and/or laboratory studies on MyChart before the doctors have had a chance to review them. We understand that in some cases there may be results that are confusing or concerning to you. Please understand that not all results are received at the same time and often the doctors may need to interpret multiple results in order to provide you with the best plan of care or course of treatment. Therefore, we ask that you please give us 2 business days to thoroughly review all your results before contacting the office for clarification. Should we see a critical lab result, you will be contacted sooner.   If You Need Anything After Your Visit  If you have any questions or concerns for your doctor, please call our main line at [phone] and press option 4 to reach your doctor's medical assistant. If no one answers, please leave a voicemail as directed and we will return your call as soon as possible. Messages left after 4 pm will be answered the following business day.   You may also send us a message via MyChart. We typically respond to MyChart messages within 1-2 business days.  For prescription refills, please ask your pharmacy to contact our office. Our fax number is [phone].  If you have an urgent issue when the clinic is closed that cannot wait until the next business day, you can page your doctor at the number below.    Please note that while we do our best to be available for urgent issues outside of office hours, we are not available 24/7.   If you have an urgent issue and are unable to reach us, you may choose to seek medical care at your doctor's office, retail clinic, urgent care center, or emergency room.  If you have a medical emergency, please immediately call 911 or go to the  emergency department.  Pager Numbers  - Dr. Kowalski: [phone]  - Dr. Moye: [phone]  - Dr. Stewart: [phone]  In the event of inclement weather, please call our main line at [phone] for an update on the status of any delays or closures.  Dermatology Medication Tips: Please keep the boxes that topical medications come in in order to help keep track of the instructions about where and how to use these. Pharmacies typically print the medication instructions only on the boxes and not directly on the medication tubes.   If your medication is too expensive, please contact our office at [phone] option 4 or send us a message through MyChart.   We are unable to tell what your co-pay for medications will be in advance as this is different depending on your insurance coverage. However, we may be able to find a substitute medication at lower cost or fill out paperwork to get insurance to cover a needed medication.   If a prior authorization is required to get your medication covered by your insurance company, please allow us 1-2 business days to complete this process.  Drug prices often vary depending on where the prescription is filled and some pharmacies may offer cheaper prices.  The website www.goodrx.com contains coupons for medications through different pharmacies. The prices here do not account for what the cost may be with help from insurance (it may be cheaper with your insurance), but the website can   give you the price if you did not use any insurance.  - You can print the associated coupon and take it with your prescription to the pharmacy.  - You may also stop by our office during regular business hours and pick up a GoodRx coupon card.  - If you need your prescription sent electronically to a different pharmacy, notify our office through [place] MyChart or by phone at [phone] option 4.     Si Usted Necesita Algo Despus de Su Visita  Tambin puede  enviarnos un mensaje a travs de MyChart. Por lo general respondemos a los mensajes de MyChart en el transcurso de 1 a 2 das hbiles.  Para renovar recetas, por favor pida a su farmacia que se ponga en contacto con nuestra oficina. Nuestro nmero de fax es el [phone].  Si tiene un asunto urgente cuando la clnica est cerrada y que no puede esperar hasta el siguiente da hbil, puede llamar/localizar a su doctor(a) al nmero que aparece a continuacin.   Por favor, tenga en cuenta que aunque hacemos todo lo posible para estar disponibles para asuntos urgentes fuera del horario de oficina, no estamos disponibles las 24 horas del da, los 7 das de la semana.   Si tiene un problema urgente y no puede comunicarse con nosotros, puede optar por buscar atencin mdica  en el consultorio de su doctor(a), en una clnica privada, en un centro de atencin urgente o en una sala de emergencias.  Si tiene una emergencia mdica, por favor llame inmediatamente al 911 o vaya a la sala de emergencias.  Nmeros de bper  - Dr. Kowalski: [phone]  - Dra. Moye: [phone]  - Dra. Stewart: [phone]  En caso de inclemencias del tiempo, por favor llame a nuestra lnea principal al [phone] para una actualizacin sobre el estado de cualquier retraso o cierre.  Consejos para la medicacin en dermatologa: Por favor, guarde las cajas en las que vienen los medicamentos de uso tpico para ayudarle a seguir las instrucciones sobre dnde y cmo usarlos. Las farmacias generalmente imprimen las instrucciones del medicamento slo en las cajas y no directamente en los tubos del medicamento.   Si su medicamento es muy caro, por favor, pngase en contacto con nuestra oficina llamando al [phone] y presione la opcin 4 o envenos un mensaje a travs de MyChart.   No podemos decirle cul ser su copago por los medicamentos por adelantado ya que esto es diferente dependiendo de la cobertura de su seguro.  Sin embargo, es posible que podamos encontrar un medicamento sustituto a menor costo o llenar un formulario para que el seguro cubra el medicamento que se considera necesario.   Si se requiere una autorizacin previa para que su compaa de seguros cubra su medicamento, por favor permtanos de 1 a 2 das hbiles para completar este proceso.  Los precios de los medicamentos varan con frecuencia dependiendo del lugar de dnde se surte la receta y alguna farmacias pueden ofrecer precios ms baratos.  El sitio web www.goodrx.com tiene cupones para medicamentos de diferentes farmacias. Los precios aqu no tienen en cuenta lo que podra costar con la ayuda del seguro (puede ser ms barato con su seguro), pero el sitio web puede darle el precio si no utiliz ningn seguro.  - Puede imprimir el cupn correspondiente y llevarlo con su receta a la farmacia.  - Tambin puede pasar por nuestra oficina durante el horario de atencin regular y recoger una tarjeta de cupones de GoodRx.  -   Si necesita que su receta se enve electrnicamente a una farmacia diferente, informe a nuestra oficina a travs de MyChart de [place] o por telfono llamando al [phone] y presione la opcin 4.  

## 2022-10-14 ENCOUNTER — Encounter: Payer: Self-pay | Admitting: Dermatology

## 2022-11-13 DIAGNOSIS — H524 Presbyopia: Secondary | ICD-10-CM | POA: Diagnosis not present

## 2022-11-13 DIAGNOSIS — H40033 Anatomical narrow angle, bilateral: Secondary | ICD-10-CM | POA: Diagnosis not present

## 2022-11-13 DIAGNOSIS — H16223 Keratoconjunctivitis sicca, not specified as Sjogren's, bilateral: Secondary | ICD-10-CM | POA: Diagnosis not present

## 2022-11-13 DIAGNOSIS — H2513 Age-related nuclear cataract, bilateral: Secondary | ICD-10-CM | POA: Diagnosis not present

## 2022-11-17 ENCOUNTER — Other Ambulatory Visit: Payer: Self-pay | Admitting: Internal Medicine

## 2022-11-17 DIAGNOSIS — M109 Gout, unspecified: Secondary | ICD-10-CM

## 2022-11-24 DIAGNOSIS — Z780 Asymptomatic menopausal state: Secondary | ICD-10-CM | POA: Diagnosis not present

## 2022-11-24 DIAGNOSIS — M85852 Other specified disorders of bone density and structure, left thigh: Secondary | ICD-10-CM | POA: Diagnosis not present

## 2022-11-24 DIAGNOSIS — Z853 Personal history of malignant neoplasm of breast: Secondary | ICD-10-CM | POA: Diagnosis not present

## 2022-11-24 DIAGNOSIS — Z1231 Encounter for screening mammogram for malignant neoplasm of breast: Secondary | ICD-10-CM | POA: Diagnosis not present

## 2022-11-24 DIAGNOSIS — M85851 Other specified disorders of bone density and structure, right thigh: Secondary | ICD-10-CM | POA: Diagnosis not present

## 2022-11-24 LAB — HM DEXA SCAN

## 2022-11-24 LAB — HM MAMMOGRAPHY

## 2022-12-04 ENCOUNTER — Encounter: Payer: Self-pay | Admitting: Hematology and Oncology

## 2022-12-04 ENCOUNTER — Encounter: Payer: Self-pay | Admitting: Nurse Practitioner

## 2022-12-19 ENCOUNTER — Encounter: Payer: Self-pay | Admitting: Internal Medicine

## 2023-01-01 ENCOUNTER — Encounter: Payer: Self-pay | Admitting: Dermatology

## 2023-01-01 ENCOUNTER — Ambulatory Visit: Payer: PPO | Admitting: Dermatology

## 2023-01-01 VITALS — BP 134/81 | HR 92

## 2023-01-01 DIAGNOSIS — L821 Other seborrheic keratosis: Secondary | ICD-10-CM

## 2023-01-01 DIAGNOSIS — L988 Other specified disorders of the skin and subcutaneous tissue: Secondary | ICD-10-CM

## 2023-01-01 DIAGNOSIS — L578 Other skin changes due to chronic exposure to nonionizing radiation: Secondary | ICD-10-CM | POA: Diagnosis not present

## 2023-01-01 DIAGNOSIS — L820 Inflamed seborrheic keratosis: Secondary | ICD-10-CM | POA: Diagnosis not present

## 2023-01-01 NOTE — Patient Instructions (Signed)
Cryotherapy Aftercare  Wash gently with soap and water everyday.   Apply Vaseline daily until healed.   Recommend daily broad spectrum sunscreen SPF 30+ to sun-exposed areas, reapply every 2 hours as needed. Call for new or changing lesions.  Staying in the shade or wearing long sleeves, sun glasses (UVA+UVB protection) and wide brim hats (4-inch brim around the entire circumference of the hat) are also recommended for sun protection.     Due to recent changes in healthcare laws, you may see results of your pathology and/or laboratory studies on MyChart before the doctors have had a chance to review them. We understand that in some cases there may be results that are confusing or concerning to you. Please understand that not all results are received at the same time and often the doctors may need to interpret multiple results in order to provide you with the best plan of care or course of treatment. Therefore, we ask that you please give us 2 business days to thoroughly review all your results before contacting the office for clarification. Should we see a critical lab result, you will be contacted sooner.   If You Need Anything After Your Visit  If you have any questions or concerns for your doctor, please call our main line at [phone] and press option 4 to reach your doctor's medical assistant. If no one answers, please leave a voicemail as directed and we will return your call as soon as possible. Messages left after 4 pm will be answered the following business day.   You may also send us a message via MyChart. We typically respond to MyChart messages within 1-2 business days.  For prescription refills, please ask your pharmacy to contact our office. Our fax number is [phone].  If you have an urgent issue when the clinic is closed that cannot wait until the next business day, you can page your doctor at the number below.    Please note that while we do our best to be available for  urgent issues outside of office hours, we are not available 24/7.   If you have an urgent issue and are unable to reach us, you may choose to seek medical care at your doctor's office, retail clinic, urgent care center, or emergency room.  If you have a medical emergency, please immediately call 911 or go to the emergency department.  Pager Numbers  - Dr. Kowalski: [phone]  - Dr. Moye: [phone]  - Dr. Stewart: [phone]  In the event of inclement weather, please call our main line at [phone] for an update on the status of any delays or closures.  Dermatology Medication Tips: Please keep the boxes that topical medications come in in order to help keep track of the instructions about where and how to use these. Pharmacies typically print the medication instructions only on the boxes and not directly on the medication tubes.   If your medication is too expensive, please contact our office at [phone] option 4 or send us a message through MyChart.   We are unable to tell what your co-pay for medications will be in advance as this is different depending on your insurance coverage. However, we may be able to find a substitute medication at lower cost or fill out paperwork to get insurance to cover a needed medication.   If a prior authorization is required to get your medication covered by your insurance company, please allow us 1-2 business days to complete this process.  Drug prices   often vary depending on where the prescription is filled and some pharmacies may offer cheaper prices.  The website www.goodrx.com contains coupons for medications through different pharmacies. The prices here do not account for what the cost may be with help from insurance (it may be cheaper with your insurance), but the website can give you the price if you did not use any insurance.  - You can print the associated coupon and take it with your prescription to the pharmacy.  - You may also  stop by our office during regular business hours and pick up a GoodRx coupon card.  - If you need your prescription sent electronically to a different pharmacy, notify our office through [facility] or by phone at [phone] option 4.     Si Usted Necesita Algo Despus de Su Visita  Tambin puede enviarnos un mensaje a travs de Pharmacist, community. Por lo general respondemos a los mensajes de MyChart en el transcurso de 1 a 2 das hbiles.  Para renovar recetas, por favor pida a su farmacia que se ponga en contacto con nuestra oficina. Harland Dingwall de fax es [place] [phone].  Si tiene un asunto urgente cuando la clnica est cerrada y que no puede esperar hasta el siguiente da hbil, puede llamar/localizar a su doctor(a) al nmero que aparece a continuacin.   Por favor, tenga en cuenta que aunque hacemos todo lo posible para estar disponibles para asuntos urgentes fuera del horario de [place], no estamos disponibles las 24 horas del da, los 7 das de la [place].   Si tiene un problema urgente y no puede comunicarse con nosotros, puede optar por buscar atencin mdica  en el consultorio de su doctor(a), en una clnica privada, en un centro de atencin urgente o en una sala de emergencias.  Si tiene Engineering geologist, por favor llame inmediatamente al 911 o vaya a la sala de emergencias.  Nmeros de bper  - Dr. Nehemiah Massed: [phone]  - Dra. Moye: [phone]  - Dra. Nicole Kindred: [phone]  En caso de inclemencias del [place], por favor llame a Johnsie Kindred principal al [phone] para una actualizacin sobre el [place] de cualquier retraso o cierre.  Consejos para la medicacin en dermatologa: Por favor, guarde las cajas en las que vienen los medicamentos de uso tpico para ayudarle a seguir las instrucciones sobre dnde y cmo usarlos. Las farmacias generalmente imprimen las instrucciones del medicamento slo en las cajas y no directamente en los tubos del [place].   Si  su medicamento es muy caro, por favor, pngase en contacto con Zigmund Daniel llamando al [phone] y presione la opcin 4 o envenos un mensaje a travs de Pharmacist, community.   No podemos decirle cul ser su copago por los medicamentos por adelantado ya que esto es diferente dependiendo de la cobertura de su seguro. Sin embargo, es posible que podamos encontrar un medicamento sustituto a Electrical engineer un formulario para que el seguro cubra el medicamento que se considera necesario.   Si se requiere una autorizacin previa para que su compaa de seguros [place] su medicamento, por favor permtanos de 1 a 2 das hbiles para completar este proceso.  Los precios de los medicamentos varan con frecuencia dependiendo del Environmental consultant de dnde se surte la receta y alguna farmacias pueden ofrecer precios ms baratos.  El sitio web www.goodrx.com tiene cupones para medicamentos de Airline pilot. Los precios aqu no tienen en cuenta lo que podra costar con la ayuda del seguro (puede ser ms barato con  su seguro), pero el sitio web puede darle el precio si no Field seismologist.  - Puede imprimir el cupn correspondiente y llevarlo con su receta a la farmacia.  - Tambin puede pasar por nuestra oficina durante el horario de atencin regular y Charity fundraiser una tarjeta de cupones de GoodRx.  - Si necesita que su receta se enve electrnicamente a una farmacia diferente, informe a nuestra oficina a travs de MyChart de [place] o por telfono llamando al [phone] y presione la opcin 4.

## 2023-01-01 NOTE — Progress Notes (Unsigned)
   Follow-Up Visit   Subjective  CURLEY FAYETTE is a 72 y.o. female who presents for the following: Follow-up (ISK's. 3 month recheck. More areas to treat on arms, chest, back) and Facial Elastosis (Would like to discuss fillers). The patient has spots, moles and lesions to be evaluated, some may be new or changing and the patient has concerns that these could be cancer.  The following portions of the chart were reviewed this encounter and updated as appropriate:  Tobacco  Allergies  Meds  Problems  Med Hx  Surg Hx  Fam Hx     Review of Systems: No other skin or systemic complaints except as noted in HPI or Assessment and Plan.  Objective  Well appearing patient in no apparent distress; mood and affect are within normal limits.  A focused examination was performed including face, back, arms, hands, chest. Relevant physical exam findings are noted in the Assessment and Plan.  hands, arms x32 (32) Erythematous keratotic or waxy stuck-on papule or plaque.  face Rhytides and volume loss.    Assessment & Plan   Seborrheic Keratoses - Stuck-on, waxy, tan-brown papules and/or plaques  - Benign-appearing - Discussed benign etiology and prognosis. - Observe - Call for any changes  Inflamed seborrheic keratosis (32) hands, arms x32 Symptomatic, irritating, patient would like treated. Destruction of lesion - hands, arms x32 Complexity: simple   Destruction method: cryotherapy   Informed consent: discussed and consent obtained   Timeout:  patient name, date of birth, surgical site, and procedure verified Lesion destroyed using liquid nitrogen: Yes   Region frozen until ice ball extended beyond lesion: Yes   Outcome: patient tolerated procedure well with no complications   Post-procedure details: wound care instructions given    Actinic Damage - chronic, secondary to cumulative UV radiation exposure/sun exposure over time - diffuse scaly erythematous macules with underlying  dyspigmentation - Recommend daily broad spectrum sunscreen SPF 30+ to sun-exposed areas, reapply every 2 hours as needed.  - Recommend staying in the shade or wearing long sleeves, sun glasses (UVA+UVB protection) and wide brim hats (4-inch brim around the entire circumference of the hat). - Call for new or changing lesions.  Elastosis of skin face Discussed mid face filler. Recommend 1 syringe for each side.  Voluma mid face $700 per syringe (x2?) Restylane Defyne or Refyne for oral commissures. 1 syringe. $650 per syringe.   Return for Earlie Raveling Next available; ISK's 2-3 months.  I, Emelia Salisbury, CMA, am acting as scribe for Sarina Ser, MD. Documentation: I have reviewed the above documentation for accuracy and completeness, and I agree with the above.  Sarina Ser, MD

## 2023-01-02 ENCOUNTER — Encounter: Payer: Self-pay | Admitting: Dermatology

## 2023-01-04 ENCOUNTER — Ambulatory Visit (INDEPENDENT_AMBULATORY_CARE_PROVIDER_SITE_OTHER): Payer: Self-pay | Admitting: Dermatology

## 2023-01-04 VITALS — BP 127/76

## 2023-01-04 DIAGNOSIS — L988 Other specified disorders of the skin and subcutaneous tissue: Secondary | ICD-10-CM

## 2023-01-04 NOTE — Progress Notes (Signed)
   Follow-Up Visit   Subjective  [name] is a 72 y.o. female who presents for the following: Pt presents for facial fillers.  The following portions of the chart were reviewed this encounter and updated as appropriate:   Tobacco  Allergies  Meds  Problems  Med Hx  Surg Hx  Fam Hx     Review of Systems:  No other skin or systemic complaints except as noted in HPI or Assessment and Plan.  Objective  Well appearing patient in no apparent distress; mood and affect are within normal limits.  A focused examination was performed including face. Relevant physical exam findings are noted in the Assessment and Plan.  Face Rhytides and volume loss.                         Assessment & Plan  Elastosis of skin Face  Voluma to mid face x 2 syringes (2 ml) Restylane Defyne to marionette lines x 1 syringe (1 ml)  Filling material injection - Face Prior to the procedure, the patient's past medical history, allergies and the rare but potential risks and complications were reviewed with the patient and a signed consent was obtained. Pre and post-treatment care was discussed and instructions provided.  Location: cheeks, nasolabial folds, and marionette lines  Filler Type: Restylane Defyne Lot [id number] Exp 11/27/2023 and Milon Dikes Lot # [id number] Exp 08/05/2023  Procedure: The area was prepped thoroughly with Puracyn. After introducing the needle into the desired treatment area, the syringe plunger was drawn back to ensure there was no flash of blood prior to injecting the filler in order to minimize risk of intravascular injection and vascular occlusion. After injection of the filler, the treated areas were cleansed and iced to reduce swelling. Post-treatment instructions were reviewed with the patient.       Patient tolerated the procedure well. The patient will call with any problems, questions or concerns prior to their next appointment.  Return for Follow  up as scheduled.  I, Ashok Cordia, CMA, am acting as scribe for Sarina Ser, MD . Documentation: I have reviewed the above documentation for accuracy and completeness, and I agree with the above.  Sarina Ser, MD

## 2023-01-04 NOTE — Patient Instructions (Signed)
Due to recent changes in healthcare laws, you may see results of your pathology and/or laboratory studies on MyChart before the doctors have had a chance to review them. We understand that in some cases there may be results that are confusing or concerning to you. Please understand that not all results are received at the same time and often the doctors may need to interpret multiple results in order to provide you with the best plan of care or course of treatment. Therefore, we ask that you please give us 2 business days to thoroughly review all your results before contacting the office for clarification. Should we see a critical lab result, you will be contacted sooner.   If You Need Anything After Your Visit  If you have any questions or concerns for your doctor, please call our main line at [phone] and press option 4 to reach your doctor's medical assistant. If no one answers, please leave a voicemail as directed and we will return your call as soon as possible. Messages left after 4 pm will be answered the following business day.   You may also send us a message via MyChart. We typically respond to MyChart messages within 1-2 business days.  For prescription refills, please ask your pharmacy to contact our office. Our fax number is [phone].  If you have an urgent issue when the clinic is closed that cannot wait until the next business day, you can page your doctor at the number below.    Please note that while we do our best to be available for urgent issues outside of office hours, we are not available 24/7.   If you have an urgent issue and are unable to reach us, you may choose to seek medical care at your doctor's office, retail clinic, urgent care center, or emergency room.  If you have a medical emergency, please immediately call 911 or go to the emergency department.  Pager Numbers  - Dr. Kowalski: [phone]  - Dr. Moye: [phone]  - Dr. Stewart:  [phone]  In the event of inclement weather, please call our main line at [phone] for an update on the status of any delays or closures.  Dermatology Medication Tips: Please keep the boxes that topical medications come in in order to help keep track of the instructions about where and how to use these. Pharmacies typically print the medication instructions only on the boxes and not directly on the medication tubes.   If your medication is too expensive, please contact our office at [phone] option 4 or send us a message through MyChart.   We are unable to tell what your co-pay for medications will be in advance as this is different depending on your insurance coverage. However, we may be able to find a substitute medication at lower cost or fill out paperwork to get insurance to cover a needed medication.   If a prior authorization is required to get your medication covered by your insurance company, please allow us 1-2 business days to complete this process.  Drug prices often vary depending on where the prescription is filled and some pharmacies may offer cheaper prices.  The website www.goodrx.com contains coupons for medications through different pharmacies. The prices here do not account for what the cost may be with help from insurance (it may be cheaper with your insurance), but the website can give you the price if you did not use any insurance.  - You can print the associated coupon and take it with   your prescription to the pharmacy.  - You may also stop by our office during regular business hours and pick up a GoodRx coupon card.  - If you need your prescription sent electronically to a different pharmacy, notify our office through [facility] MyChart or by phone at [phone] option 4.     Si Usted Necesita Algo Despus de Su Visita  Tambin puede enviarnos un mensaje a travs de MyChart. Por lo general respondemos a los mensajes de MyChart en el transcurso de 1 a 2  das hbiles.  Para renovar recetas, por favor pida a su farmacia que se ponga en contacto con nuestra oficina. Nuestro nmero de fax es el [phone].  Si tiene un asunto urgente cuando la clnica est cerrada y que no puede esperar hasta el siguiente da hbil, puede llamar/localizar a su doctor(a) al nmero que aparece a continuacin.   Por favor, tenga en cuenta que aunque hacemos todo lo posible para estar disponibles para asuntos urgentes fuera del horario de oficina, no estamos disponibles las 24 horas del da, los 7 das de la semana.   Si tiene un problema urgente y no puede comunicarse con nosotros, puede optar por buscar atencin mdica  en el consultorio de su doctor(a), en una clnica privada, en un centro de atencin urgente o en una sala de emergencias.  Si tiene una emergencia mdica, por favor llame inmediatamente al 911 o vaya a la sala de emergencias.  Nmeros de bper  - Dr. Kowalski: [phone]  - Dra. Moye: [phone]  - Dra. Stewart: [phone]  En caso de inclemencias del tiempo, por favor llame a nuestra lnea principal al [phone] para una actualizacin sobre el estado de cualquier retraso o cierre.  Consejos para la medicacin en dermatologa: Por favor, guarde las cajas en las que vienen los medicamentos de uso tpico para ayudarle a seguir las instrucciones sobre dnde y cmo usarlos. Las farmacias generalmente imprimen las instrucciones del medicamento slo en las cajas y no directamente en los tubos del medicamento.   Si su medicamento es muy caro, por favor, pngase en contacto con nuestra oficina llamando al [phone] y presione la opcin 4 o envenos un mensaje a travs de MyChart.   No podemos decirle cul ser su copago por los medicamentos por adelantado ya que esto es diferente dependiendo de la cobertura de su seguro. Sin embargo, es posible que podamos encontrar un medicamento sustituto a menor costo o llenar un formulario para que el  seguro cubra el medicamento que se considera necesario.   Si se requiere una autorizacin previa para que su compaa de seguros cubra su medicamento, por favor permtanos de 1 a 2 das hbiles para completar este proceso.  Los precios de los medicamentos varan con frecuencia dependiendo del lugar de dnde se surte la receta y alguna farmacias pueden ofrecer precios ms baratos.  El sitio web www.goodrx.com tiene cupones para medicamentos de diferentes farmacias. Los precios aqu no tienen en cuenta lo que podra costar con la ayuda del seguro (puede ser ms barato con su seguro), pero el sitio web puede darle el precio si no utiliz ningn seguro.  - Puede imprimir el cupn correspondiente y llevarlo con su receta a la farmacia.  - Tambin puede pasar por nuestra oficina durante el horario de atencin regular y recoger una tarjeta de cupones de GoodRx.  - Si necesita que su receta se enve electrnicamente a una farmacia diferente, informe a nuestra oficina a travs de MyChart de [facility]   o por telfono llamando al [phone] y presione la opcin 4.  

## 2023-01-15 ENCOUNTER — Encounter: Payer: Self-pay | Admitting: Dermatology

## 2023-01-17 NOTE — Progress Notes (Signed)
Patient Care Team: Unk Pinto, MD as PCP - General (Internal Medicine) Avon Gully, NP as Nurse Practitioner (Obstetrics and Gynecology) Clarene Essex, MD as Consulting Physician (Gastroenterology) Suella Broad, MD as Consulting Physician (Physical Medicine and Rehabilitation) Jari Pigg, MD as Consulting Physician (Dermatology) Nicholas Lose, MD as Consulting Physician (Hematology and Oncology) Alphonsa Overall, MD as Consulting Physician (General Surgery) Noreene Filbert, MD as Referring Physician (Radiation Oncology)  DIAGNOSIS: No diagnosis found.  SUMMARY OF ONCOLOGIC HISTORY: Oncology History  Ductal carcinoma in situ (DCIS) of left breast  11/17/2019 Initial Diagnosis   Screening mammogram detected left breast density UOQ 5 mm at 10 o'clock position.  Stereotactic biopsy revealed intermediate grade DCIS ER/PR positive   11/19/2019 Cancer Staging   Staging form: Breast, AJCC 8th Edition - Clinical stage from 11/19/2019: Stage 0 (cTis (DCIS), cN0, cM0, G2, ER+, PR+, HER2: Not Assessed)   12/05/2019 Surgery   Left lumpectomy Lucia Gaskins) [phone]): DCIS, intermediate grade, 0.3cm, clear margins. No regional lymph nodes examined.   12/05/2019 Cancer Staging   Staging form: Breast, AJCC 8th Edition - Pathologic stage from 12/05/2019: Stage 0 (pTis (DCIS), pN0, cM0, ER+, PR+)   12/31/2019 - 02/03/2020 Radiation Therapy   Adjuvant radiation at [facility]   01/2020 - 01/2020 Anti-estrogen oral therapy   Tamoxifen 5 mg daily, discontinued due to severe debilitating side effects which included dizziness, lightheadedness, mental fogginess, irritability, hot flashes, etc.     CHIEF COMPLIANT: Follow-up of left breast DCIS     INTERVAL HISTORY: [name] is a 72 y.o. with above-mentioned history of left breast DCIS treated with a left lumpectomy, radiation, and she could not take antiestrogen therapy with tamoxifen because of adverse effects. She presents to the clinic today  for annual follow-up.    ALLERGIES:  is allergic to dexamethasone.  MEDICATIONS:  Current Outpatient Medications  Medication Sig Dispense Refill   allopurinol (ZYLOPRIM) 300 MG tablet TAKE 1 TABLET BY MOUTH ONCE DAILY FOR  GOUT  PREVENTION 90 tablet 3   Ascorbic Acid (VITAMIN C) 1000 MG tablet Take 1,000 mg by mouth daily.     aspirin EC 81 MG tablet Take 1 tablet (81 mg total) by mouth daily.     benzonatate (TESSALON) 200 MG capsule Take 1 perle 3 x / day to prevent cough 30 capsule 1   Cholecalciferol (VITAMIN D PO) Take 10,000 Units by mouth daily.     Cyanocobalamin (VITAMIN B-12 PO) Take 1 tablet by mouth daily.     esomeprazole (NEXIUM) 40 MG capsule Take 1 capsule (40 mg total) by mouth daily. 30 capsule 3   fenofibrate (TRICOR) 145 MG tablet Take  1 tablet  Daily for Triglycerides  (Blood Fats)                                                    /              TAKE                          BY                    MOUTH                   ONCE DAILY 90 tablet 3  Fexofenadine-Pseudoephedrine (ALLEGRA-D 24 HOUR PO) Take by mouth.     fluticasone (FLONASE) 50 MCG/ACT nasal spray Use 1 to 2 sprays each Nares 1 to 2 x /day 48 g 3   phentermine (ADIPEX-P) 37.5 MG tablet Take  1 tablet  every Morning  for Dieting & Weight Loss 90 tablet 1   pravastatin (PRAVACHOL) 20 MG tablet 3 days a week     pyridOXINE (VITAMIN B-6) 100 MG tablet Take 100 mg by mouth daily.     triamterene-hydrochlorothiazide (MAXZIDE-25) 37.5-25 MG tablet Take 1/2 to 1 tablet Daily for BP & Fluid Retention 90 tablet 3   zinc gluconate 50 MG tablet Take 50 mg by mouth daily.     No current facility-administered medications for this visit.    PHYSICAL EXAMINATION: ECOG PERFORMANCE STATUS: {CHL ONC ECOG PS:[phone]}  There were no vitals filed for this visit. There were no vitals filed for this visit.  BREAST:*** No palpable masses or nodules in either right or left breasts. No palpable axillary supraclavicular or  infraclavicular adenopathy no breast tenderness or nipple discharge. (exam performed in the presence of a chaperone)  LABORATORY DATA:  I have reviewed the data as listed    Latest Ref Rng & Units 10/05/2022   12:00 AM 02/06/2022    9:52 AM 07/25/2021   11:15 AM  CMP  Glucose 65 - 99 mg/dL 87  91  90   BUN 7 - 25 mg/dL '20  25  27   '$ Creatinine 0.60 - 1.00 mg/dL 0.87  0.94  0.99   Sodium 135 - 146 mmol/L 140  141  139   Potassium 3.5 - 5.3 mmol/L 3.5  3.5  3.7   Chloride 98 - 110 mmol/L 103  102  101   CO2 20 - 32 mmol/L '28  31  31   '$ Calcium 8.6 - 10.4 mg/dL 9.5  9.7  10.1   Total Protein 6.1 - 8.1 g/dL 6.8  7.2  7.3   Total Bilirubin 0.2 - 1.2 mg/dL 0.5  0.8  0.6   AST 10 - 35 U/L '24  21  20   '$ ALT 6 - 29 U/L '22  16  16     '$ Lab Results  Component Value Date   WBC 4.6 10/05/2022   HGB 13.8 10/05/2022   HCT 39.7 10/05/2022   MCV 86.5 10/05/2022   PLT 178 10/05/2022   NEUTROABS 2,240 10/05/2022    ASSESSMENT & PLAN:  No problem-specific Assessment & Plan notes found for this encounter.    No orders of the defined types were placed in this encounter.  The patient has a good understanding of the overall plan. she agrees with it. she will call with any problems that may develop before the next visit here. Total time spent: 30 mins including face to face time and time spent for planning, charting and co-ordination of care   Suzzette Righter, [place] 01/17/23    I Gardiner Coins am acting as a Education administrator for [employer]  ***

## 2023-01-18 ENCOUNTER — Ambulatory Visit: Payer: Medicare Other | Admitting: Hematology and Oncology

## 2023-01-22 ENCOUNTER — Inpatient Hospital Stay: Payer: PPO | Attending: Hematology and Oncology | Admitting: Hematology and Oncology

## 2023-01-22 VITALS — BP 136/76 | HR 82 | Temp 97.6°F | Resp 16 | Wt 159.7 lb

## 2023-01-22 DIAGNOSIS — D0512 Intraductal carcinoma in situ of left breast: Secondary | ICD-10-CM

## 2023-01-22 DIAGNOSIS — M8580 Other specified disorders of bone density and structure, unspecified site: Secondary | ICD-10-CM | POA: Diagnosis not present

## 2023-01-22 DIAGNOSIS — Z86000 Personal history of in-situ neoplasm of breast: Secondary | ICD-10-CM | POA: Diagnosis not present

## 2023-01-22 DIAGNOSIS — Z923 Personal history of irradiation: Secondary | ICD-10-CM | POA: Insufficient documentation

## 2023-01-22 NOTE — Assessment & Plan Note (Signed)
12/05/2019: Left lumpectomy by Dr. Lucia Gaskins: Intermediate grade DCIS, 0.3 cm, margins clear, ER 100%, PR 90% Staging: Tis NX stage 0   Treatment plan: 1.  Adjuvant radiation therapy at [facility] completed 02/03/20 2.  Adjuvant antiestrogen therapy with tamoxifen (started 5 mg now taking 5 mg every other day) Discontinued it (dizziness, fogginess and confusion and fatigue  ---------------------------------------------------------------------------------------------------------------------------------------------------   Breast Cancer Surveillance: 1. Breast Exam: 01/22/2023 benign 2. Mammogram: 11/24/2022: Benign Density Cat B at [place] scan 11/24/2022 at [facility]: T-score -1.4: Osteopenia recommend calcium and vitamin D   RTC in 1 year

## 2023-02-05 ENCOUNTER — Encounter: Payer: Self-pay | Admitting: Internal Medicine

## 2023-02-05 DIAGNOSIS — K219 Gastro-esophageal reflux disease without esophagitis: Secondary | ICD-10-CM | POA: Diagnosis not present

## 2023-02-06 IMAGING — RF DG ESOPHAGUS
8 series · 14 of 24 positions shown · non-contrast
Comparison: None.

CLINICAL DATA: Gastroesophageal reflux, sinus drainage for 2 years

EXAM:
ESOPHOGRAM / BARIUM SWALLOW / BARIUM TABLET STUDY
TECHNIQUE: Combined double contrast and single contrast examination performed
using effervescent crystals, thick barium liquid, and thin barium
liquid. The patient was observed with fluoroscopy swallowing a 13 mm
barium sulphate tablet.
FLUOROSCOPY TIME:  Fluoroscopy Time:  1.8 minute
Radiation Exposure Index (if provided by the fluoroscopic device):
36.4 mGy
Number of Acquired Spot Images: 0

[Series 1: cp_standard · 0.17mm/px · 1 of 1 slices shown (1 of 7)]
[im 1/1]
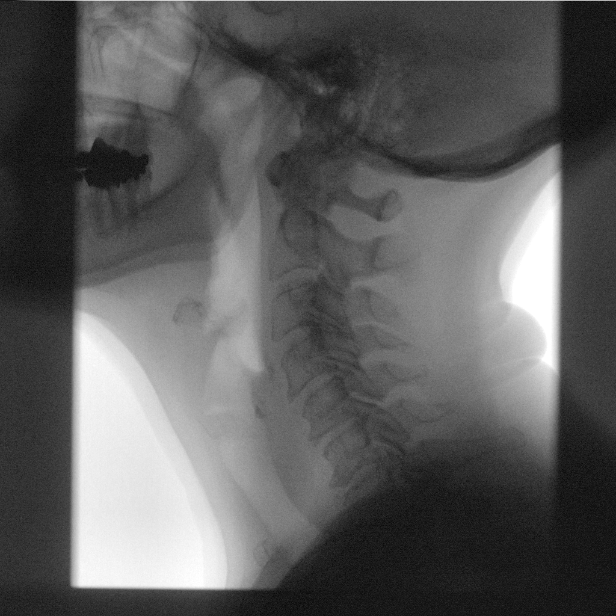

[Series 2: cp_standard · 0.17mm/px · 1 of 83 frames shown (2 of 7)]
[frame 42/83]
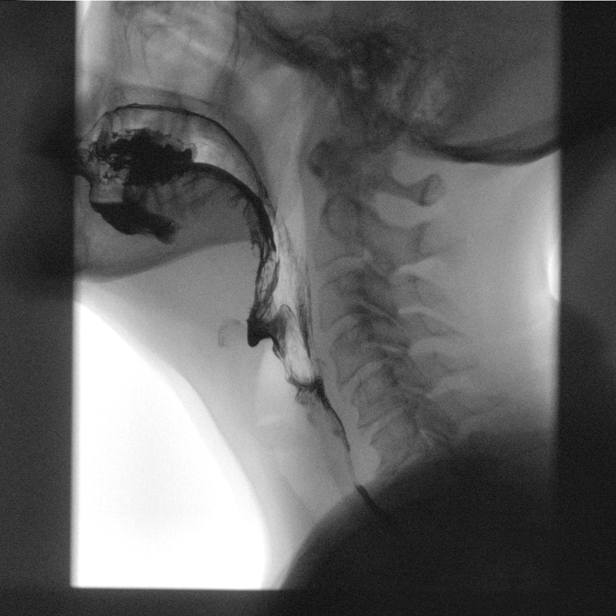

[Series 3: cp_standard · 0.17mm/px · 2 of 73 frames shown (3 of 7)]
[frame 11/73]
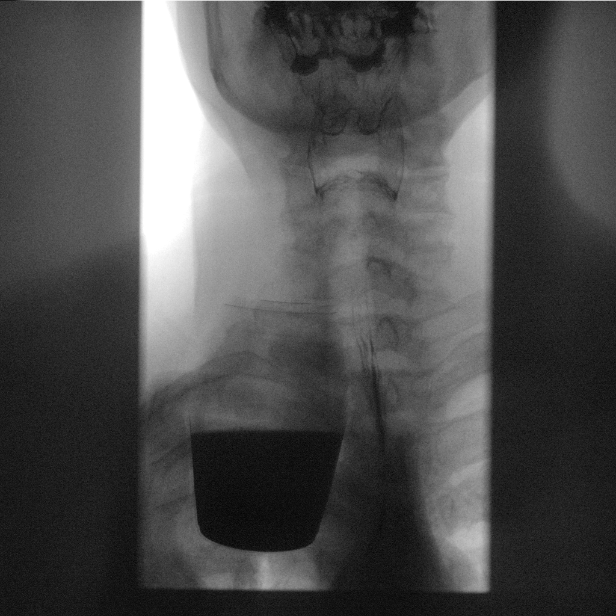
[frame 63/73]
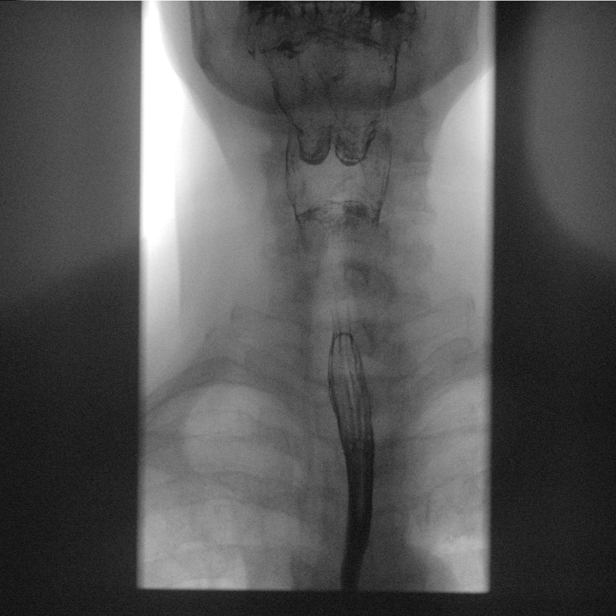

[Series 4: cp_standard · 0.17mm/px · 2 of 81 frames shown (4 of 7)]
[frame 13/81]
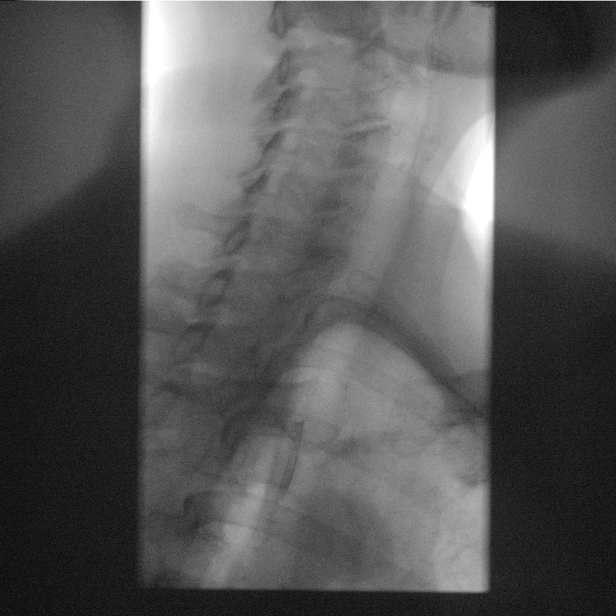
[frame 69/81]
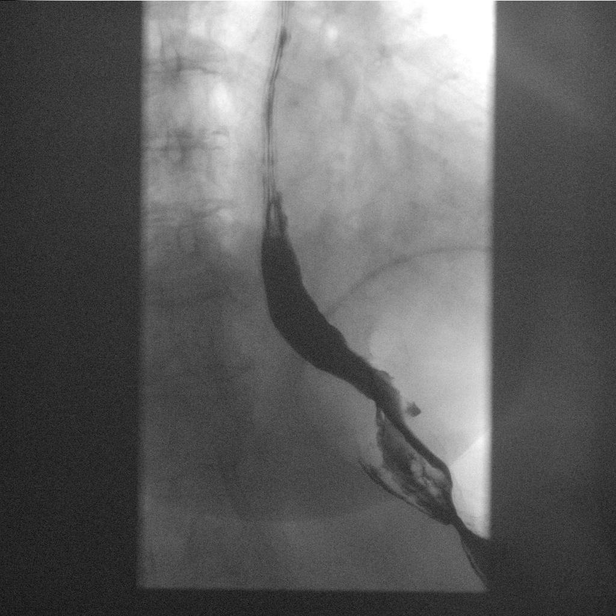

[Series 5: fluoro_barium 2fps_bw · 0.17mm/px · 2 of 20 frames shown]
[frame 11/20]
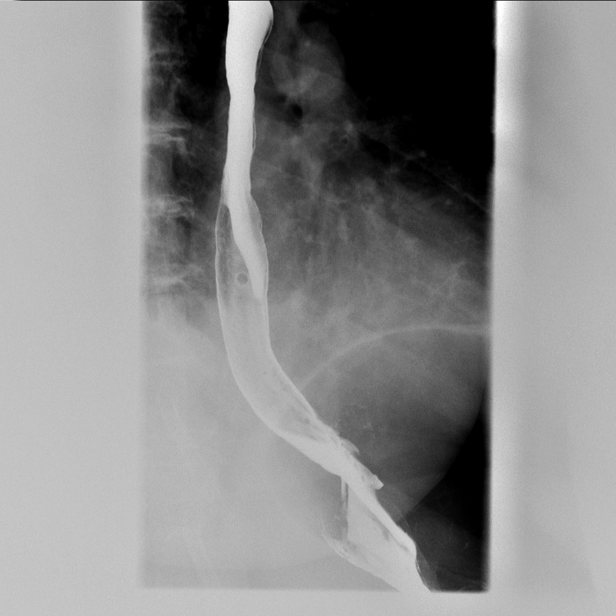
[frame 14/20]
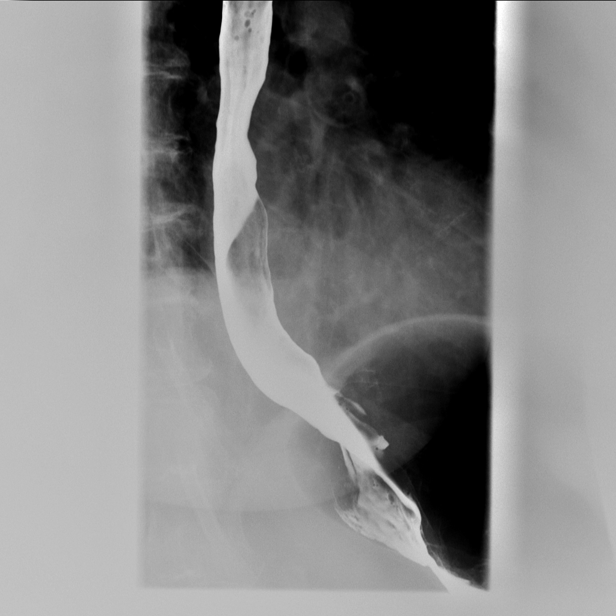

[Series 6: cp_standard · 0.25mm/px · 2 of 43 frames shown (5 of 7)]
[frame 7/43]
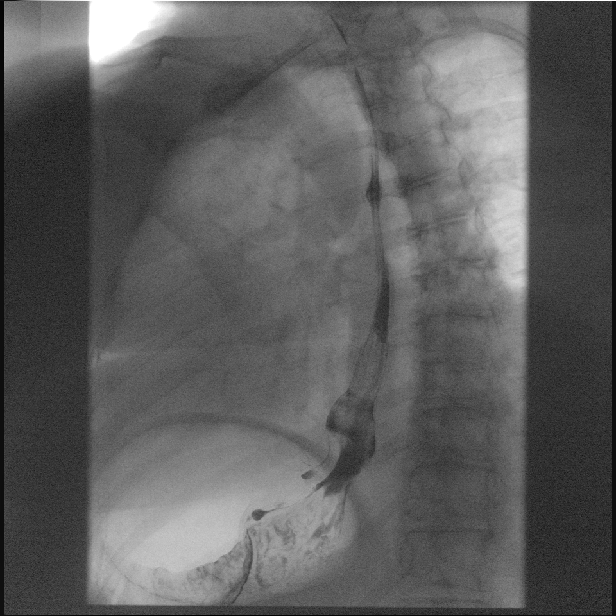
[frame 37/43]
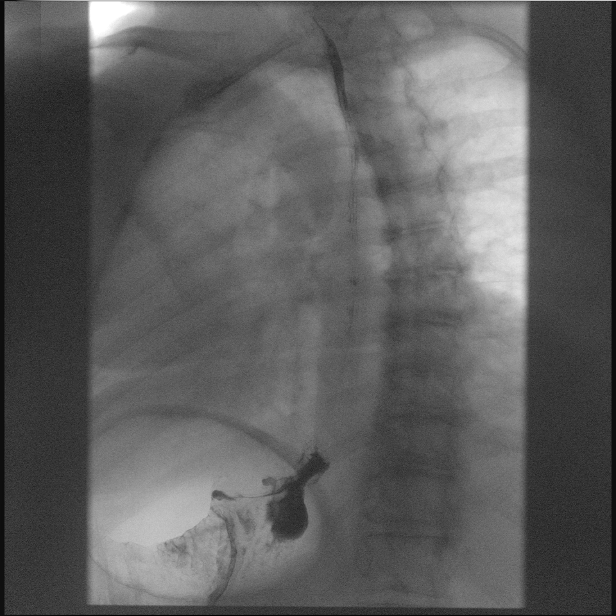

[Series 7: cp_standard · 0.25mm/px · 2 of 98 frames shown (6 of 7)]
[frame 50/98]
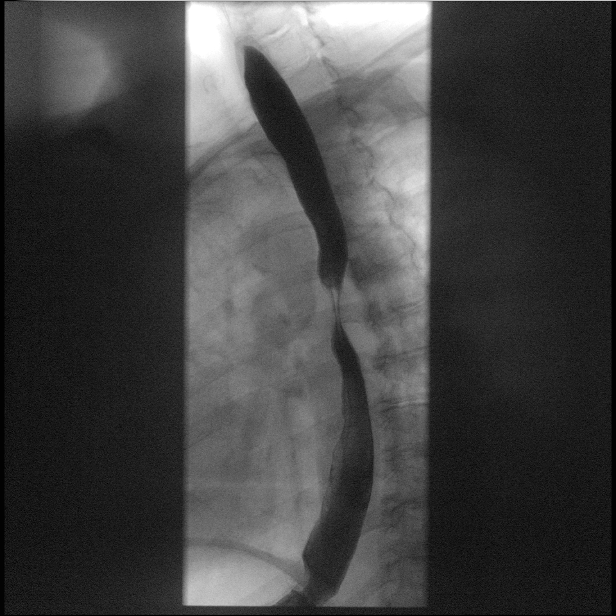
[frame 76/98]
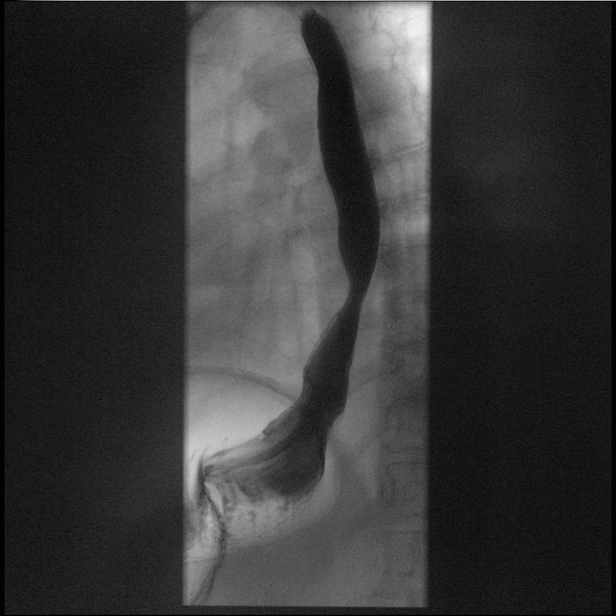

[Series 8: cp_standard · 0.25mm/px · 2 of 55 frames shown (7 of 7)]
[frame 9/55]
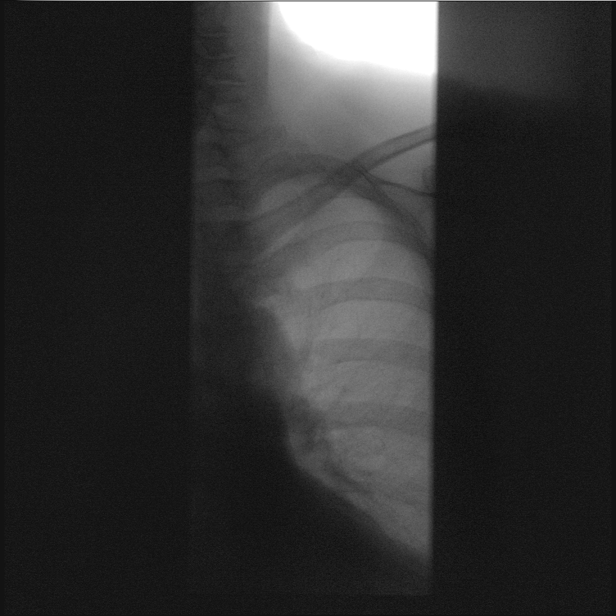
[frame 47/55]
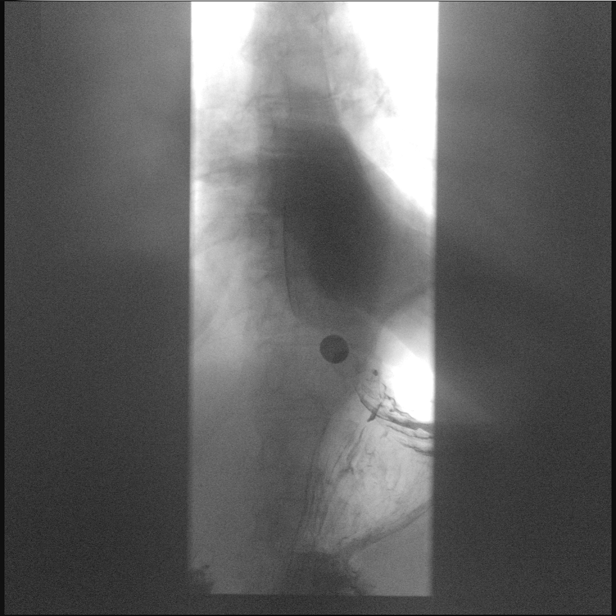

[14 of 24 positions shown; findings below may reference images not displayed]

FINDINGS: Normal pharyngeal anatomy and motility. Contrast flowed freely
through the esophagus without evidence of a stricture or mass.
Normal esophageal mucosa without evidence of irregularity or
ulceration. Esophageal motility was normal. Severe gastroesophageal
reflux extending to the cervical esophagus. No definite hiatal
hernia was demonstrated.

At the end of the examination a 13 mm barium tablet was administered
which transited through the esophagus and esophagogastric junction
without delay.

Degenerative disease with disc height loss at C4-5, C5-6 and C6-7.
IMPRESSION: 1. Severe gastroesophageal reflux extending to the cervical
esophagus.

## 2023-02-12 ENCOUNTER — Encounter: Payer: No Typology Code available for payment source | Admitting: Internal Medicine

## 2023-03-06 ENCOUNTER — Encounter: Payer: No Typology Code available for payment source | Admitting: Internal Medicine

## 2023-03-14 ENCOUNTER — Ambulatory Visit (INDEPENDENT_AMBULATORY_CARE_PROVIDER_SITE_OTHER): Payer: PPO | Admitting: Dermatology

## 2023-03-14 VITALS — BP 132/82 | HR 84

## 2023-03-14 DIAGNOSIS — L988 Other specified disorders of the skin and subcutaneous tissue: Secondary | ICD-10-CM | POA: Diagnosis not present

## 2023-03-14 DIAGNOSIS — L820 Inflamed seborrheic keratosis: Secondary | ICD-10-CM | POA: Diagnosis not present

## 2023-03-14 DIAGNOSIS — L821 Other seborrheic keratosis: Secondary | ICD-10-CM

## 2023-03-14 DIAGNOSIS — L578 Other skin changes due to chronic exposure to nonionizing radiation: Secondary | ICD-10-CM

## 2023-03-14 NOTE — Progress Notes (Signed)
   Follow-Up Visit   Subjective  [name] is a 72 y.o. female who presents for the following: here for isk follow up, reports spots at chest, neck, arms and hands The patient has spots, moles and lesions to be evaluated, some may be new or changing and the patient may have concern these could be cancer.  The following portions of the chart were reviewed this encounter and updated as appropriate: medications, allergies, medical history  Review of Systems:  No other skin or systemic complaints except as noted in HPI or Assessment and Plan.  Objective  Well appearing patient in no apparent distress; mood and affect are within normal limits.  A focused examination was performed of the following areas: Face, chest, neck, arms and hands  Relevant exam findings are noted in the Assessment and Plan.  arms and neck x 17 (17) Erythematous stuck-on, waxy papule or plaque   Assessment & Plan   FACIAL ELASTOSIS Exam: Rhytides and volume loss.  Treatment Plan: Discussed filler followup schedule and will discuss treatment at follow up appointment  Consider 2 syringes mandible   Recommend daily broad spectrum sunscreen SPF 30+ to sun-exposed areas, reapply every 2 hours as needed. Call for new or changing lesions.  Staying in the shade or wearing long sleeves, sun glasses (UVA+UVB protection) and wide brim hats (4-inch brim around the entire circumference of the hat) are also recommended for sun protection.   Inflamed seborrheic keratosis (17) arms and neck x 17  Symptomatic, irritating, patient would like treated.  Destruction of lesion - arms and neck x 17 Complexity: simple   Destruction method: cryotherapy   Informed consent: discussed and consent obtained   Timeout:  patient name, date of birth, surgical site, and procedure verified Lesion destroyed using liquid nitrogen: Yes   Region frozen until ice ball extended beyond lesion: Yes   Outcome: patient tolerated procedure well  with no complications   Post-procedure details: wound care instructions given    Elastosis of skin  Actinic skin damage  Seborrheic keratosis  SEBORRHEIC KERATOSIS - Stuck-on, waxy, tan-brown papules and/or plaques  - Benign-appearing - Discussed benign etiology and prognosis. - Observe - Call for any changes  ACTINIC DAMAGE - chronic, secondary to cumulative UV radiation exposure/sun exposure over time - diffuse scaly erythematous macules with underlying dyspigmentation - Recommend daily broad spectrum sunscreen SPF 30+ to sun-exposed areas, reapply every 2 hours as needed.  - Recommend staying in the shade or wearing long sleeves, sun glasses (UVA+UVB protection) and wide brim hats (4-inch brim around the entire circumference of the hat). - Call for new or changing lesions.  Return for keep follow up as scheduled, 4 month isk follow up.  IAsher Muir, CMA, am acting as scribe for Armida Sans, MD.  Documentation: I have reviewed the above documentation for accuracy and completeness, and I agree with the above.  Armida Sans, MD

## 2023-03-14 NOTE — Patient Instructions (Addendum)

## 2023-03-21 ENCOUNTER — Encounter: Payer: Self-pay | Admitting: Internal Medicine

## 2023-03-21 NOTE — Progress Notes (Signed)
Comprehensive Evaluation &  Examination   Future Appointments  Date Time Provider Department  03/22/2023                    cpe 10:00 AM Lucky Cowboy, MD [facility]  04/25/2023  2:00 PM Deirdre Evener, MD [facility]  06/20/2023  2:45 PM Deirdre Evener, MD [facility]  10/08/2023                  wellness  11:00 AM Raynelle Dick, NP [facility]  01/24/2024  8:30 AM Serena Croissant, MD [facility]  04/01/2024                      cpe 10:00 AM Lucky Cowboy, MD [facility]        This very nice 72 y.o.  MWF presents for a comprehensive evaluation and management of multiple medical co-morbidities.  Patient has been followed for HTN, HLD, Prediabetes  and Vitamin D Deficiency. Patient has Gout & GERD controlled  on her Allopurinol & Omeprazole .         Had Lt Breast Lumpectomy with CIS - clear margins in Jan 2021 by Dr Ovidio Kin and had po Radiotherapy and is  followed annually by Dr Pamelia Hoit.  She was intolerant to Tamoxifen.         HTN predates circa 1998. Patient's BP has been controlled at home and patient denies any cardiac symptoms as chest pain, palpitations, shortness of breath, dizziness or ankle swelling. Today's BP is at goal - 120/74 .        Patient's hyperlipidemia (2015) is controlled with diet and Pravastatin /Fenofibrate.  Patient denies myalgias or other medication SE's. Last lipids were at goal :  Lab Results  Component Value Date   CHOL 153 10/05/2022   HDL 50 10/05/2022   LDLCALC 81 10/05/2022   TRIG 127 10/05/2022   CHOLHDL 3.1 10/05/2022         Patient has hx/o prediabetes (A1c 5.9% /2012) and patient denies reactive hypoglycemic symptoms, visual blurring, diabetic polys or paresthesias. Last A1c was near  goal :  Lab Results  Component Value Date   HGBA1C 5.8 (H) 10/05/2022         Finally, patient has history of Vitamin D Deficiency ("33" /2018) and last Vitamin D was at goal:  Lab Results  Component Value Date   VD25OH 73 02/06/2022      Current Outpatient Medications  Medication Instructions   allopurinol  300 MG tablet TAKE 1 TABLET DAILY   aspirin EC 81 mg  Daily   VITAMIN D   10,000 Units  Daily   VITAMIN B-12 1 tablet Daily   fenofibrate 145 MG tablet Take  1 tablet  Daily    ALLEGRA-D 24 R Daily as needed    phentermine  37.5 MG tablet Take  1 tablet  every Morning     pravastatin 20 MG tablet 3 days a week   pyridOXINE (VIT B6)  100 mg Daily   triamterene-hctz 37.5-25  Take 1/2 to 1 tablet Daily    vitamin C 1,000 mg Daily   zinc 50 mg Daily    No Known Allergies   Past Medical History:  Diagnosis Date   Allergy    Anxiety    Bell palsy 02/04/2016   Left   Cancer (HCC) 2011   BCC   Carpal tunnel syndrome on both sides 01/23/2017   Complication of anesthesia  Hyperlipidemia    Hypertension 2005   Plantar fasciitis    PONV (postoperative nausea and vomiting)      Health Maintenance  Topic Date Due   Pneumonia Vaccine 59+ Years old (2 - PCV) 02/08/2018   COVID-19 Vaccine (5 - Booster for Pfizer series) 08/02/2021   DEXA SCAN  07/25/2022 (Originally 12/16/2015)   TETANUS/TDAP  07/25/2022 (Originally 11/28/2019)   MAMMOGRAM  11/22/2022   HPV VACCINES  Aged Out   Zoster Vaccines- Shingrix  Discontinued     Immunization History  Administered Date(s) Administered   Influenza,inj,quad  08/27/2017   Influenza  10/02/2018, 10/27/2019   PFIZER C  Covid-19 Vacc 09/05/2020, 06/07/2021   PFIZER SARS-COV-2 Vacc 01/03/2020, 01/24/2020   PPD Test 05/14/2014   Pneumococcal -23 02/08/2017    - Patient had Colonoscopies in 2006 & 2011 w/ an adenomatous polyp ([facility]).   - Colon - Jan 2023 - Dr Ewing Schlein - Recc 5 year f/u - due Jan 2028  Last MGM  - 11/24/2022 - & dexaBMD - Osteopenia T1-1.4   Past Surgical History:  Procedure Laterality Date   ABDOMINAL HYSTERECTOMY  1990   basal cell carcinoma  2011   nose   BREAST LUMPECTOMY WITH RADIOACTIVE SEED LOCALIZATION Left 12/05/2019   Procedure: LEFT BREAST  LUMPECTOMY WITH RADIOACTIVE SEED LOCALIZATION;  Surgeon: Ovidio Kin, MD   TENDON REPAIR     plantar fasciitis   TONSILLECTOMY       Family History  Problem Relation Age of Onset   Hypertension Mother    Heart attack Mother    Hyperlipidemia Mother    Heart disease Mother 68       fatal MI   Stroke Mother    Arthritis Mother        rheumatoid   Hypertension Brother    Cancer Brother        throat, leukemia   Parkinson's disease Brother    Cancer Father        lung     Social History   Tobacco Use   Smoking status: Never   Smokeless tobacco: Never  Vaping Use   Vaping Use: Never used  Substance Use Topics   Alcohol use: Yes   Drug use: Never      ROS Constitutional: Denies fever, chills, weight loss/gain, headaches, insomnia,  night sweats, and change in appetite. Does c/o fatigue. Eyes: Denies redness, blurred vision, diplopia, discharge, itchy, watery eyes.  ENT: Denies discharge, congestion, post nasal drip, epistaxis, sore throat, earache, hearing loss, dental pain, Tinnitus, Vertigo, Sinus pain, snoring.  Cardio: Denies chest pain, palpitations, irregular heartbeat, syncope, dyspnea, diaphoresis, orthopnea, PND, claudication, edema Respiratory: denies cough, dyspnea, DOE, pleurisy, hoarseness, laryngitis, wheezing.  Gastrointestinal: Denies dysphagia, heartburn, reflux, water brash, pain, cramps, nausea, vomiting, bloating, diarrhea, constipation, hematemesis, melena, hematochezia, jaundice, hemorrhoids Genitourinary: Denies dysuria, frequency, urgency, nocturia, hesitancy, discharge, hematuria, flank pain Breast: Breast lumps, nipple discharge, bleeding.  Musculoskeletal: Denies arthralgia, myalgia, stiffness, Jt. Swelling, pain, limp, and strain/sprain. Denies falls. Skin: Denies puritis, rash, hives, warts, acne, eczema, changing in skin lesion Neuro: No weakness, tremor, incoordination, spasms, paresthesia, pain Psychiatric: Denies confusion, memory loss,  sensory loss. Denies Depression. Endocrine: Denies change in weight, skin, hair change, nocturia, and paresthesia, diabetic polys, visual blurring, hyper / hypo glycemic episodes.  Heme/Lymph: No excessive bleeding, bruising, enlarged lymph nodes.  Physical Exam  BP 120/74   Pulse 90   Temp 97.9 F (36.6 C)   Resp 16   Ht 5\' 7"  (1.702 m)  Wt 153 lb 9.6 oz (69.7 kg)   SpO2 99%   BMI 24.06 kg/m   General Appearance: Well nourished, well groomed and in no apparent distress.  Eyes: PERRLA, EOMs, conjunctiva no swelling or erythema, normal fundi and vessels. Sinuses: No frontal/maxillary tenderness ENT/Mouth: EACs patent / TMs  nl. Nares clear without erythema, swelling, mucoid exudates. Oral hygiene is good. No erythema, swelling, or exudate. Tongue normal, non-obstructing. Tonsils not swollen or erythematous. Hearing normal.  Neck: Supple, thyroid not palpable. No bruits, nodes or JVD. Respiratory: Respiratory effort normal.  BS equal and clear bilateral without rales, rhonci, wheezing or stridor. Cardio: Heart sounds are normal with regular rate and rhythm and no murmurs, rubs or gallops. Peripheral pulses are normal and equal bilaterally without edema. No aortic or femoral bruits. Chest: symmetric with normal excursions and percussion. Breasts: Symmetric, without lumps, nipple discharge, retractions, or fibrocystic changes.  Abdomen: Flat, soft with bowel sounds active. Nontender, no guarding, rebound, hernias, masses, or organomegaly.  Lymphatics: Non tender without lymphadenopathy.  Musculoskeletal: Full ROM all peripheral extremities, joint stability, 5/5 strength, and normal gait. Skin: Warm and dry without rashes, lesions, cyanosis, clubbing or  ecchymosis.  Neuro: Cranial nerves intact, reflexes equal bilaterally. Normal muscle tone, no cerebellar symptoms. Sensation intact.  Pysch: Alert and oriented X 3, normal affect, Insight and Judgment appropriate.    Assessment and  Plan   1. Essential hypertension  - EKG 12-Lead - Urinalysis, Routine w reflex microscopic - Microalbumin / creatinine urine ratio - CBC with Differential/Platelet - COMPLETE METABOLIC PANEL WITH GFR - Magnesium - TSH  2. Hyperlipidemia, mixed  - EKG 12-Lead - Lipid panel - TSH  3. Abnormal glucose  - EKG 12-Lead - Hemoglobin A1c - Insulin, random  4. Vitamin D deficiency  - VITAMIN D 25 Hydroxy   5. Chronic gout without tophus  - Uric acid  6. Screening for colorectal cancer  - Ambulatory referral to Gastroenterology  7. Screening for ischemic heart disease  - EKG 12-Lead  8. FHx: heart disease  - EKG 12-Lead  9. Ductal carcinoma in situ (DCIS) of left breast   10. Medication management  - Urinalysis, Routine w reflex microscopic - Microalbumin / creatinine urine ratio - CBC with Differential/Platelet - COMPLETE METABOLIC PANEL WITH GFR - Magnesium - Lipid panel - TSH - Hemoglobin A1c - Insulin, random - VITAMIN D 25 Hydroxy         Patient was counseled in prudent diet to achieve/maintain BMI less than 25 for weight control, BP monitoring, regular exercise and medications. Discussed med's effects and SE's. Screening labs and tests as requested with regular follow-up as recommended. Over 40 minutes of exam, counseling, chart review and high complex critical decision making was performed.   Marinus Maw, MD

## 2023-03-21 NOTE — Patient Instructions (Signed)

## 2023-03-22 ENCOUNTER — Other Ambulatory Visit: Payer: Self-pay

## 2023-03-22 ENCOUNTER — Encounter: Payer: Self-pay | Admitting: Internal Medicine

## 2023-03-22 ENCOUNTER — Ambulatory Visit (INDEPENDENT_AMBULATORY_CARE_PROVIDER_SITE_OTHER): Payer: PPO | Admitting: Internal Medicine

## 2023-03-22 VITALS — BP 120/74 | HR 90 | Temp 97.9°F | Resp 16 | Ht 67.0 in | Wt 153.6 lb

## 2023-03-22 DIAGNOSIS — R7309 Other abnormal glucose: Secondary | ICD-10-CM | POA: Diagnosis not present

## 2023-03-22 DIAGNOSIS — Z136 Encounter for screening for cardiovascular disorders: Secondary | ICD-10-CM

## 2023-03-22 DIAGNOSIS — M1A9XX0 Chronic gout, unspecified, without tophus (tophi): Secondary | ICD-10-CM | POA: Diagnosis not present

## 2023-03-22 DIAGNOSIS — E782 Mixed hyperlipidemia: Secondary | ICD-10-CM | POA: Diagnosis not present

## 2023-03-22 DIAGNOSIS — I10 Essential (primary) hypertension: Secondary | ICD-10-CM | POA: Diagnosis not present

## 2023-03-22 DIAGNOSIS — Z8249 Family history of ischemic heart disease and other diseases of the circulatory system: Secondary | ICD-10-CM

## 2023-03-22 DIAGNOSIS — Z0000 Encounter for general adult medical examination without abnormal findings: Secondary | ICD-10-CM | POA: Diagnosis not present

## 2023-03-22 DIAGNOSIS — Z79899 Other long term (current) drug therapy: Secondary | ICD-10-CM | POA: Diagnosis not present

## 2023-03-22 DIAGNOSIS — K219 Gastro-esophageal reflux disease without esophagitis: Secondary | ICD-10-CM

## 2023-03-22 DIAGNOSIS — E559 Vitamin D deficiency, unspecified: Secondary | ICD-10-CM

## 2023-03-22 DIAGNOSIS — Z1211 Encounter for screening for malignant neoplasm of colon: Secondary | ICD-10-CM

## 2023-03-22 DIAGNOSIS — D0512 Intraductal carcinoma in situ of left breast: Secondary | ICD-10-CM

## 2023-03-22 LAB — CBC WITH DIFFERENTIAL/PLATELET
Basophils Absolute: 41 cells/uL (ref 0–200)
Hemoglobin: 14.8 g/dL (ref 11.7–15.5)
MCV: 86.8 fL (ref 80.0–100.0)
Neutro Abs: 2016 cells/uL (ref 1500–7800)
RDW: 12.9 % (ref 11.0–15.0)

## 2023-03-22 MED ORDER — PHENTERMINE HCL 37.5 MG PO TABS: ORAL_TABLET | ORAL | 1 refills | Status: DC

## 2023-03-22 MED ORDER — OMEPRAZOLE 40 MG PO CPDR: 40.0000 mg | DELAYED_RELEASE_CAPSULE | Freq: Every day | ORAL | 3 refills | Status: DC

## 2023-03-22 MED ORDER — METOCLOPRAMIDE HCL 10 MG PO TABS
ORAL_TABLET | ORAL | 1 refills | Status: DC
Start: 2023-03-22 — End: 2023-07-03

## 2023-03-22 MED ORDER — METFORMIN HCL ER 500 MG PO TB24
ORAL_TABLET | ORAL | 3 refills | Status: DC
Start: 2023-03-22 — End: 2024-04-04

## 2023-03-22 MED ORDER — TOPIRAMATE 50 MG PO TABS
ORAL_TABLET | ORAL | 1 refills | Status: DC
Start: 2023-03-22 — End: 2023-09-19

## 2023-03-22 MED ORDER — METFORMIN HCL ER 500 MG PO TB24: ORAL_TABLET | ORAL | 3 refills | Status: DC

## 2023-03-23 ENCOUNTER — Encounter: Payer: Self-pay | Admitting: Internal Medicine

## 2023-03-23 LAB — HEMOGLOBIN A1C
Hgb A1c MFr Bld: 6 % of total Hgb — ABNORMAL HIGH (ref <5.7)
Mean Plasma Glucose: 126 mg/dL
eAG (mmol/L): 7 mmol/L

## 2023-03-23 LAB — CBC WITH DIFFERENTIAL/PLATELET
Absolute Monocytes: 405 cells/uL (ref 200–950)
Basophils Relative: 0.9 %
Eosinophils Absolute: 140 cells/uL (ref 15–500)
Eosinophils Relative: 3.1 %
HCT: 42.1 % (ref 35.0–45.0)
Lymphs Abs: 1899 cells/uL (ref 850–3900)
MCH: 30.5 pg (ref 27.0–33.0)
MCHC: 35.2 g/dL (ref 32.0–36.0)
MPV: 9.3 fL (ref 7.5–12.5)
Monocytes Relative: 9 %
Neutrophils Relative %: 44.8 %
Platelets: 210 10*3/uL (ref 140–400)
RBC: 4.85 10*6/uL (ref 3.80–5.10)
Total Lymphocyte: 42.2 %
WBC: 4.5 10*3/uL (ref 3.8–10.8)

## 2023-03-23 LAB — URINALYSIS, ROUTINE W REFLEX MICROSCOPIC
Bacteria, UA: NONE SEEN /HPF
Bilirubin Urine: NEGATIVE
Glucose, UA: NEGATIVE
Hgb urine dipstick: NEGATIVE
Hyaline Cast: NONE SEEN /LPF
Ketones, ur: NEGATIVE
Nitrite: NEGATIVE
Protein, ur: NEGATIVE
Specific Gravity, Urine: 1.023 (ref 1.001–1.035)
Squamous Epithelial / HPF: NONE SEEN /HPF (ref <=5)
pH: 5.5 (ref 5.0–8.0)

## 2023-03-23 LAB — LIPID PANEL
Cholesterol: 136 mg/dL (ref <200)
HDL: 44 mg/dL — ABNORMAL LOW (ref >=50)
LDL Cholesterol (Calc): 70 mg/dL (calc)
Non-HDL Cholesterol (Calc): 92 mg/dL (calc) (ref <130)
Total CHOL/HDL Ratio: 3.1 (calc) (ref <5.0)
Triglycerides: 138 mg/dL (ref <150)

## 2023-03-23 LAB — COMPLETE METABOLIC PANEL WITH GFR
AG Ratio: 1.8 (calc) (ref 1.0–2.5)
ALT: 21 U/L (ref 6–29)
AST: 27 U/L (ref 10–35)
Albumin: 4.6 g/dL (ref 3.6–5.1)
Alkaline phosphatase (APISO): 100 U/L (ref 37–153)
BUN/Creatinine Ratio: 26 (calc) — ABNORMAL HIGH (ref 6–22)
BUN: 26 mg/dL — ABNORMAL HIGH (ref 7–25)
CO2: 32 mmol/L (ref 20–32)
Calcium: 10.6 mg/dL — ABNORMAL HIGH (ref 8.6–10.4)
Chloride: 100 mmol/L (ref 98–110)
Creat: 1 mg/dL (ref 0.60–1.00)
Globulin: 2.5 g/dL (calc) (ref 1.9–3.7)
Glucose, Bld: 55 mg/dL — ABNORMAL LOW (ref 65–99)
Potassium: 3.8 mmol/L (ref 3.5–5.3)
Sodium: 142 mmol/L (ref 135–146)
Total Bilirubin: 0.7 mg/dL (ref 0.2–1.2)
Total Protein: 7.1 g/dL (ref 6.1–8.1)
eGFR: 60 mL/min/{1.73_m2} (ref >=60)

## 2023-03-23 LAB — VITAMIN D 25 HYDROXY (VIT D DEFICIENCY, FRACTURES): Vit D, 25-Hydroxy: 104 ng/mL — ABNORMAL HIGH (ref 30–100)

## 2023-03-23 LAB — MAGNESIUM: Magnesium: 2.1 mg/dL (ref 1.5–2.5)

## 2023-03-23 LAB — MICROALBUMIN / CREATININE URINE RATIO
Creatinine, Urine: 121 mg/dL (ref 20–275)
Microalb Creat Ratio: 4 mg/g creat (ref <30)
Microalb, Ur: 0.5 mg/dL

## 2023-03-23 LAB — TSH: TSH: 1.25 mIU/L (ref 0.40–4.50)

## 2023-03-23 LAB — INSULIN, RANDOM: Insulin: 10 u[IU]/mL

## 2023-03-23 LAB — MICROSCOPIC MESSAGE

## 2023-03-23 LAB — URIC ACID: Uric Acid, Serum: 4.6 mg/dL (ref 2.5–7.0)

## 2023-03-23 NOTE — Progress Notes (Signed)
^<^<^<^<^<^<^<^<^<^<^<^<^<^<^<^<^<^<^<^<^<^<^<^<^<^<^<^<^<^<^<^<^<^<^<^<^ ^>^>^>^>^>^>^>^>^>^>^>>^>^>^>^>^>^>^>^>^>^>^>^>^>^>^>^>^>^>^>^>^>^>^>^>^ -Test results slightly outside the reference range are not unusual. If there is anything important, I will review this with you,  otherwise it is considered normal test values.  If you have further questions,  please do not hesitate to contact me at the office or via My Chart.  ^<^<^<^<^<^<^<^<^<^<^<^<^<^<^<^<^<^<^<^<^<^<^<^<^<^<^<^<^<^<^<^<^<^<^<^<^ ^>^>^>^>^>^>^>^>^>^>^>^>^>^>^>^>^>^>^>^>^>^>^>^>^>^>^>^>^>^>^>^>^>^>^>^>^  - Chol = 136  &   LDL = 70  -     Both  Excellent   - Very low risk for Heart Attack  / Stroke  ^<^<^<^<^<^<^<^<^<^<^<^<^<^<^<^<^<^<^<^<^<^<^<^<^<^<^<^<^<^<^<^<^<^<^<^<^ ^>^>^>^>^>^>^>^>^>^>^>^>^>^>^>^>^>^>^>^>^>^>^>^>^>^>^>^>^>^>^>^>^>^>^>^>^  -  A1c = 6.0%  Blood sugar and A1c are elevated in the borderline and                                                                 early or pre-diabetes range which has the same   300% increased risk for heart attack, stroke, cancer and                                                  alzheimer- type vascular dementia as full blown diabetes.   But the good news is that diet, exercise with weight loss can                                                                                    cure the early diabetes at this point.  ^<^<^<^<^<^<^<^<^<^<^<^<^<^<^<^<^<^<^<^<^<^<^<^<^<^<^<^<^<^<^<^<^<^<^<^<^ ^>^>^>^>^>^>^>^>^>^>^>^>^>^>^>^>^>^>^>^>^>^>^>^>^>^>^>^>^>^>^>^>^>^>^>^>^  -  It is very important that you work harder with diet by                                    avoiding all foods that are white except chicken, fish & calliflower.  - Avoid white rice  (brown & wild rice is OK),   - Avoid white potatoes  (sweet potatoes in moderation is OK),   White bread or wheat bread or anything made out of                                               white flour like bagels, donuts,  rolls, buns, biscuits, cakes,  - pastries, cookies, pizza crust, and pasta (made from white flour & egg whites)   - vegetarian pasta or spinach or wheat pasta is OK.  - Multigrain breads like Arnold's, Pepperidge [employer] or   multigrain sandwich thins or high fiber breads like   Eureka bread or "Dave's Killer" breads that are 4 to 5 grams fiber per slice !  are best.    Diet, exercise and weight loss can reverse and cure diabetes in the early stages.    - Diet, exercise and weight  loss is very important in the                                              control and prevention of complications of diabetes which                                                                  affects every system in your body, ie.   -Brain - dementia/stroke,  - eyes - glaucoma/blindness,  - heart - heart attack/heart failure,  - kidneys - dialysis,  - stomach - gastric paralysis,  - intestines - malabsorption,  - nerves - severe painful neuritis,  - circulation - gangrene & loss of a leg(s)  - and finally  . . . . . . . . . . . . . . . . . .    - cancer and Alzheimer's  ^<^<^<^<^<^<^<^<^<^<^<^<^<^<^<^<^<^<^<^<^<^<^<^<^<^<^<^<^<^<^<^<^<^<^<^<^ ^>^>^>^>^>^>^>^>^>^>^>^>^>^>^>^>^>^>^>^>^>^>^>^>^>^>^>^>^>^>^>^>^>^>^>^>^  - Vitamin D = 104  - Excellent   - Please keep dose same  !  ^<^<^<^<^<^<^<^<^<^<^<^<^<^<^<^<^<^<^<^<^<^<^<^<^<^<^<^<^<^<^<^<^<^<^<^<^ ^>^>^>^>^>^>^>^>^>^>^>^>^>^>^>^>^>^>^>^>^>^>^>^>^>^>^>^>^>^>^>^>^>^>^>^>^  - Uric Acid / Gout Test  is normal & OK   ^<^<^<^<^<^<^<^<^<^<^<^<^<^<^<^<^<^<^<^<^<^<^<^<^<^<^<^<^<^<^<^<^<^<^<^<^ ^>^>^>^>^>^>^>^>^>^>^>^>^>^>^>^>^>^>^>^>^>^>^>^>^>^>^>^>^>^>^>^>^>^>^>^>^  -  All Else - CBC - Kidneys - Electrolytes - Liver - Magnesium & Thyroid    - all  Normal / OK  ^<^<^<^<^<^<^<^<^<^<^<^<^<^<^<^<^<^<^<^<^<^<^<^<^<^<^<^<^<^<^<^<^<^<^<^<^ ^>^>^>^>^>^>^>^>^>^>^>^>^>^>^>^>^>^>^>^>^>^>^>^>^>^>^>^>^>^>^>^>^>^>^>^>^  - You may start the Metformin - 1  daily with supper for 1st week  and                                              then increase up to 1 tablet  2 x /day at [facility]  ^<^<^<^<^<^<^<^<^<^<^<^<^<^<^<^<^<^<^<^<^<^<^<^<^<^<^<^<^<^<^<^<^<^<^<^<^ ^>^>^>^>^>^>^>^>^>^>^>^>^>^>^>^>^>^>^>^>^>^>^>^>^>^>^>^>^>^>^>^>^>^>^>^>^  -

## 2023-03-24 ENCOUNTER — Encounter: Payer: Self-pay | Admitting: Internal Medicine

## 2023-03-26 ENCOUNTER — Encounter: Payer: Self-pay | Admitting: Dermatology

## 2023-03-28 ENCOUNTER — Other Ambulatory Visit: Payer: Self-pay | Admitting: Gastroenterology

## 2023-03-28 DIAGNOSIS — K2100 Gastro-esophageal reflux disease with esophagitis, without bleeding: Secondary | ICD-10-CM | POA: Diagnosis not present

## 2023-03-28 DIAGNOSIS — K449 Diaphragmatic hernia without obstruction or gangrene: Secondary | ICD-10-CM

## 2023-04-02 ENCOUNTER — Telehealth: Payer: Self-pay | Admitting: Gastroenterology

## 2023-04-02 NOTE — Telephone Encounter (Signed)
Hi Dr. Barron Alvine,   We received a referral or patient to be considered for TIF procedure. Records from [facility] were obtained and scanned into Media for yo to review and advise on scheduling.  Thanks

## 2023-04-03 ENCOUNTER — Encounter: Payer: Self-pay | Admitting: Gastroenterology

## 2023-04-06 ENCOUNTER — Encounter
Admission: RE | Admit: 2023-04-06 | Discharge: 2023-04-06 | Disposition: A | Discharge status: Home / Self Care | Payer: PPO | Source: Ambulatory Visit | Attending: Gastroenterology | Admitting: Gastroenterology

## 2023-04-06 DIAGNOSIS — K2100 Gastro-esophageal reflux disease with esophagitis, without bleeding: Secondary | ICD-10-CM | POA: Insufficient documentation

## 2023-04-06 DIAGNOSIS — K449 Diaphragmatic hernia without obstruction or gangrene: Secondary | ICD-10-CM | POA: Diagnosis not present

## 2023-04-06 DIAGNOSIS — K219 Gastro-esophageal reflux disease without esophagitis: Secondary | ICD-10-CM | POA: Diagnosis not present

## 2023-04-06 MED ORDER — TECHNETIUM TC 99M SULFUR COLLOID
2.0000 | Freq: Once | INTRAVENOUS | Status: AC | PRN
  Administered 2023-04-06: 2 via ORAL

## 2023-04-25 ENCOUNTER — Ambulatory Visit (INDEPENDENT_AMBULATORY_CARE_PROVIDER_SITE_OTHER): Payer: Self-pay | Admitting: Dermatology

## 2023-04-25 VITALS — BP 134/77 | HR 89

## 2023-04-25 DIAGNOSIS — L988 Other specified disorders of the skin and subcutaneous tissue: Secondary | ICD-10-CM

## 2023-04-25 NOTE — Progress Notes (Signed)
   Follow-Up Visit   Subjective  [name] is a 72 y.o. female who presents for the following: filler for facial elastosis discussed filller Patient is mostly interested in doing the oral commissure/marionette lines.  She has been advised in the past and again today that midface filler would also be helpful in her overall rejuvenation.  She understands but wants to stick to the lower face today.  The following portions of the chart were reviewed this encounter and updated as appropriate: medications, allergies, medical history  Review of Systems:  No other skin or systemic complaints except as noted in HPI or Assessment and Plan.  Objective  Well appearing patient in no apparent distress; mood and affect are within normal limits.  A focused examination was performed of the face. Relevant physical exam findings are noted in the Assessment and Plan or shown in photos.  Before photos    Injection map photo     Assessment & Plan    Facial Elastosis  Prior to the procedure, the patient's past medical history, allergies and the rare but potential risks and complications were reviewed with the patient and a signed consent was obtained. Pre and post-treatment care was discussed and instructions provided.   Location: marionette lines and anterior chin crease  Filler Type: Restylane refyne  Procedure: The area was prepped thoroughly with Puracyn. After introducing the needle into the desired treatment area, the syringe plunger was drawn back to ensure there was no flash of blood prior to injecting the filler in order to minimize risk of intravascular injection and vascular occlusion. After injection of the filler, the treated areas were cleansed and iced to reduce swelling. Post-treatment instructions were reviewed with the patient.       Patient tolerated the procedure well. The patient will call with any problems, questions or concerns prior to their next appointment.  Return for  keep follow up as scheduled.  IAsher Muir, CMA, am acting as scribe for Armida Sans, MD.  Documentation: I have reviewed the above documentation for accuracy and completeness, and I agree with the above.  Armida Sans, MD

## 2023-04-25 NOTE — Patient Instructions (Addendum)
Due to recent changes in healthcare laws, you may see results of your pathology and/or laboratory studies on MyChart before the doctors have had a chance to review them. We understand that in some cases there may be results that are confusing or concerning to you. Please understand that not all results are received at the same time and often the doctors may need to interpret multiple results in order to provide you with the best plan of care or course of treatment. Therefore, we ask that you please give us 2 business days to thoroughly review all your results before contacting the office for clarification. Should we see a critical lab result, you will be contacted sooner.   If You Need Anything After Your Visit  If you have any questions or concerns for your doctor, please call our main line at [phone] and press option 4 to reach your doctor's medical assistant. If no one answers, please leave a voicemail as directed and we will return your call as soon as possible. Messages left after 4 pm will be answered the following business day.   You may also send us a message via MyChart. We typically respond to MyChart messages within 1-2 business days.  For prescription refills, please ask your pharmacy to contact our office. Our fax number is [phone].  If you have an urgent issue when the clinic is closed that cannot wait until the next business day, you can page your doctor at the number below.    Please note that while we do our best to be available for urgent issues outside of office hours, we are not available 24/7.   If you have an urgent issue and are unable to reach us, you may choose to seek medical care at your doctor's office, retail clinic, urgent care center, or emergency room.  If you have a medical emergency, please immediately call 911 or go to the emergency department.  Pager Numbers  - Dr. Kowalski: [phone]  - Dr. Moye: [phone]  - Dr. Stewart:  [phone]  In the event of inclement weather, please call our main line at [phone] for an update on the status of any delays or closures.  Dermatology Medication Tips: Please keep the boxes that topical medications come in in order to help keep track of the instructions about where and how to use these. Pharmacies typically print the medication instructions only on the boxes and not directly on the medication tubes.   If your medication is too expensive, please contact our office at [phone] option 4 or send us a message through MyChart.   We are unable to tell what your co-pay for medications will be in advance as this is different depending on your insurance coverage. However, we may be able to find a substitute medication at lower cost or fill out paperwork to get insurance to cover a needed medication.   If a prior authorization is required to get your medication covered by your insurance company, please allow us 1-2 business days to complete this process.  Drug prices often vary depending on where the prescription is filled and some pharmacies may offer cheaper prices.  The website www.goodrx.com contains coupons for medications through different pharmacies. The prices here do not account for what the cost may be with help from insurance (it may be cheaper with your insurance), but the website can give you the price if you did not use any insurance.  - You can print the associated coupon and take it with   your prescription to the pharmacy.  - You may also stop by our office during regular business hours and pick up a GoodRx coupon card.  - If you need your prescription sent electronically to a different pharmacy, notify our office through [place] MyChart or by phone at [phone] option 4.     Si Usted Necesita Algo Despus de Su Visita  Tambin puede enviarnos un mensaje a travs de MyChart. Por lo general respondemos a los mensajes de MyChart en el transcurso de 1 a 2  das hbiles.  Para renovar recetas, por favor pida a su farmacia que se ponga en contacto con nuestra oficina. Nuestro nmero de fax es el [phone].  Si tiene un asunto urgente cuando la clnica est cerrada y que no puede esperar hasta el siguiente da hbil, puede llamar/localizar a su doctor(a) al nmero que aparece a continuacin.   Por favor, tenga en cuenta que aunque hacemos todo lo posible para estar disponibles para asuntos urgentes fuera del horario de oficina, no estamos disponibles las 24 horas del da, los 7 das de la semana.   Si tiene un problema urgente y no puede comunicarse con nosotros, puede optar por buscar atencin mdica  en el consultorio de su doctor(a), en una clnica privada, en un centro de atencin urgente o en una sala de emergencias.  Si tiene una emergencia mdica, por favor llame inmediatamente al 911 o vaya a la sala de emergencias.  Nmeros de bper  - Dr. Kowalski: [phone]  - Dra. Moye: [phone]  - Dra. Stewart: [phone]  En caso de inclemencias del tiempo, por favor llame a nuestra lnea principal al [phone] para una actualizacin sobre el estado de cualquier retraso o cierre.  Consejos para la medicacin en dermatologa: Por favor, guarde las cajas en las que vienen los medicamentos de uso tpico para ayudarle a seguir las instrucciones sobre dnde y cmo usarlos. Las farmacias generalmente imprimen las instrucciones del medicamento slo en las cajas y no directamente en los tubos del medicamento.   Si su medicamento es muy caro, por favor, pngase en contacto con nuestra oficina llamando al [phone] y presione la opcin 4 o envenos un mensaje a travs de MyChart.   No podemos decirle cul ser su copago por los medicamentos por adelantado ya que esto es diferente dependiendo de la cobertura de su seguro. Sin embargo, es posible que podamos encontrar un medicamento sustituto a menor costo o llenar un formulario para que el  seguro cubra el medicamento que se considera necesario.   Si se requiere una autorizacin previa para que su compaa de seguros cubra su medicamento, por favor permtanos de 1 a 2 das hbiles para completar este proceso.  Los precios de los medicamentos varan con frecuencia dependiendo del lugar de dnde se surte la receta y alguna farmacias pueden ofrecer precios ms baratos.  El sitio web www.goodrx.com tiene cupones para medicamentos de diferentes farmacias. Los precios aqu no tienen en cuenta lo que podra costar con la ayuda del seguro (puede ser ms barato con su seguro), pero el sitio web puede darle el precio si no utiliz ningn seguro.  - Puede imprimir el cupn correspondiente y llevarlo con su receta a la farmacia.  - Tambin puede pasar por nuestra oficina durante el horario de atencin regular y recoger una tarjeta de cupones de GoodRx.  - Si necesita que su receta se enve electrnicamente a una farmacia diferente, informe a nuestra oficina a travs de MyChart de [place]   o por telfono llamando al [phone] y presione la opcin 4.  

## 2023-05-02 ENCOUNTER — Encounter: Payer: Self-pay | Admitting: Dermatology

## 2023-06-20 ENCOUNTER — Ambulatory Visit: Payer: PPO | Admitting: Dermatology

## 2023-06-20 VITALS — BP 119/76 | HR 100

## 2023-06-20 DIAGNOSIS — W908XXA Exposure to other nonionizing radiation, initial encounter: Secondary | ICD-10-CM | POA: Diagnosis not present

## 2023-06-20 DIAGNOSIS — L578 Other skin changes due to chronic exposure to nonionizing radiation: Secondary | ICD-10-CM | POA: Diagnosis not present

## 2023-06-20 DIAGNOSIS — L821 Other seborrheic keratosis: Secondary | ICD-10-CM

## 2023-06-20 DIAGNOSIS — L988 Other specified disorders of the skin and subcutaneous tissue: Secondary | ICD-10-CM

## 2023-06-20 DIAGNOSIS — L820 Inflamed seborrheic keratosis: Secondary | ICD-10-CM | POA: Diagnosis not present

## 2023-06-20 NOTE — Progress Notes (Signed)
   Follow-Up Visit   Subjective  [name] is a 72 y.o. female who presents for the following: Irritated skin lesions on the trunk and extremities. Patient would like them treated today. The patient has spots, moles and lesions to be evaluated, some may be new or changing and the patient may have concern these could be cancer.  The following portions of the chart were reviewed this encounter and updated as appropriate: medications, allergies, medical history  Review of Systems:  No other skin or systemic complaints except as noted in HPI or Assessment and Plan.  Objective  Well appearing patient in no apparent distress; mood and affect are within normal limits.   A focused examination was performed of the following areas: the face, neck, trunk, and extremities   Relevant exam findings are noted in the Assessment and Plan.  Back x 5, L arm x 9, R arm x 15 (17) Erythematous stuck-on, waxy papule or plaque    Assessment & Plan   SEBORRHEIC KERATOSIS - Stuck-on, waxy, tan-brown papules and/or plaques  - Benign-appearing - Discussed benign etiology and prognosis. - Observe - Call for any changes  ACTINIC DAMAGE - chronic, secondary to cumulative UV radiation exposure/sun exposure over time - diffuse scaly erythematous macules with underlying dyspigmentation - Recommend daily broad spectrum sunscreen SPF 30+ to sun-exposed areas, reapply every 2 hours as needed.  - Recommend staying in the shade or wearing long sleeves, sun glasses (UVA+UVB protection) and wide brim hats (4-inch brim around the entire circumference of the hat). - Call for new or changing lesions.  Inflamed seborrheic keratosis (17) Back x 5, L arm x 9, R arm x 15  Symptomatic, irritating, patient would like treated.  Destruction of lesion - Back x 5, L arm x 9, R arm x 15 (17) Complexity: simple   Destruction method: cryotherapy   Informed consent: discussed and consent obtained   Timeout:  patient  name, date of birth, surgical site, and procedure verified Lesion destroyed using liquid nitrogen: Yes   Region frozen until ice ball extended beyond lesion: Yes   Outcome: patient tolerated procedure well with no complications   Post-procedure details: wound care instructions given    FACIAL ELASTOSIS Exam: Rhytides and volume loss.  Treatment Plan: Consider adding more Voluma or being seen by plastic surgeon for a face lift.  Recommend daily broad spectrum sunscreen SPF 30+ to sun-exposed areas, reapply every 2 hours as needed. Call for new or changing lesions.  Staying in the shade or wearing long sleeves, sun glasses (UVA+UVB protection) and wide brim hats (4-inch brim around the entire circumference of the hat) are also recommended for sun protection.   Return in about 4 months (around 10/21/2023) for TBSE.  Maylene Roes, CMA, am acting as scribe for Armida Sans, MD .  Documentation: I have reviewed the above documentation for accuracy and completeness, and I agree with the above.  Armida Sans, MD

## 2023-06-20 NOTE — Patient Instructions (Signed)

## 2023-06-24 ENCOUNTER — Encounter: Payer: Self-pay | Admitting: Dermatology

## 2023-07-03 ENCOUNTER — Ambulatory Visit: Payer: PPO | Admitting: Gastroenterology

## 2023-07-03 ENCOUNTER — Encounter: Payer: Self-pay | Admitting: Gastroenterology

## 2023-07-03 VITALS — BP 130/70 | HR 88 | Ht 66.0 in | Wt 152.1 lb

## 2023-07-03 DIAGNOSIS — K219 Gastro-esophageal reflux disease without esophagitis: Secondary | ICD-10-CM | POA: Diagnosis not present

## 2023-07-03 DIAGNOSIS — K449 Diaphragmatic hernia without obstruction or gangrene: Secondary | ICD-10-CM | POA: Diagnosis not present

## 2023-07-03 NOTE — Patient Instructions (Addendum)
You have been scheduled for an endoscopy. Please follow written instructions given to you at your visit today.  If you use inhalers (even only as needed), please bring them with you on the day of your procedure.  If you take any of the following medications, they will need to be adjusted prior to your procedure:   DO NOT TAKE 7 DAYS PRIOR TO TEST- Trulicity (dulaglutide) Ozempic, Wegovy (semaglutide) Mounjaro (tirzepatide) Bydureon Bcise (exanatide extended release)  DO NOT TAKE 1 DAY PRIOR TO YOUR TEST Rybelsus (semaglutide) Adlyxin (lixisenatide) Victoza (liraglutide) Byetta (exanatide) ___________________________________________________________________________   Stop Phentermine 7 days before procedure-last dose on 07/24/23.  _______________________________________________________  If your blood pressure at your visit was 140/90 or greater, please contact your primary care physician to follow up on this.  _______________________________________________________  If you are age 72 or older, your body mass index should be between 23-30. Your Body mass index is 24.55 kg/m. If this is out of the aforementioned range listed, please consider follow up with your Primary Care Provider.  If you are age 26 or younger, your body mass index should be between 19-25. Your Body mass index is 24.55 kg/m. If this is out of the aformentioned range listed, please consider follow up with your Primary Care Provider.   __________________________________________________________  The [facility] providers would like to encourage you to use [facility] to communicate with providers for non-urgent requests or questions.  Due to long hold times on the telephone, sending your provider a message by [facility] may be a faster and more efficient way to get a response.  Please allow 48 business hours for a response.  Please remember that this is for non-urgent requests.   Due to recent changes in healthcare laws, you  may see the results of your imaging and laboratory studies on MyChart before your provider has had a chance to review them.  We understand that in some cases there may be results that are confusing or concerning to you. Not all laboratory results come back in the same time frame and the provider may be waiting for multiple results in order to interpret others.  Please give [place] 48 hours in order for your provider to thoroughly review all the results before contacting the office for clarification of your results.     Thank you for choosing me and [facility].  Vito Cirigliano, D.O.

## 2023-07-03 NOTE — Progress Notes (Signed)
Chief Complaint: GERD   Referring Provider:     Vida Rigger, MD    HPI:     [name] is a 72 y.o. female with a history of HTN, HLD, prediabetes, vitamin D deficiency, gout, GERD, referred to me by Dr. Ewing Schlein for evaluation of possible antireflux intervention with Transoral Incisionless Fundoplication (TIF) with a goal to stop or significantly reduce acid suppression therapy.  Reflux symptoms started about 3 years ago.  Started with increased sinus drainage, then developed regurgitation, increased throat clearing, dry cough.  Symptoms occurs throughout the day and nocturnal breakthrough as well.  Has been treated with high-dose PPI (briefly went up to omeprazole 80 mg bid) and added Pepcid 20 mg at nighttime without much relief.  Sleeps with HOB elevated, avoid eating within 3+ hours of bedtime.  Was evaluated by Dr. Pollyann Kennedy in the [facility].  CT sinuses were normal, exam unremarkable to include normal indirect laryngoscopy, and felt sxs were predominantly reflux related.    Last seen by Dr. Ewing Schlein on 03/30/2023, and referred for possible endoscopic fundoplication for refractory reflux.    GERD history: -Index symptoms: Nocturnal regurgitation, sinus drainage, dry cough, increased throat clearing, waterbrash. No dysphagia.  -Exacerbating features: Supine/night time -Medications trialed: Omeprazole, Pepcid, Carafate -Current medications: Omeprazole 40 mg daily, Pepcid 20 mg daily -Complications: Hiatal hernia  GERD evaluation: - Last EGD: 08/2021 - Barium esophagram: 08/22/2021: Severe gastroesophageal reflux extending to the cervical esophagus.  Otherwise normal - Esophageal Manometry: None - pH/Impedance: None - Bravo: None - GES: 04/06/2023: Normal  Endoscopic History: - 09/21/2021: EGD: Small hiatal hernia, normal stomach and duodenum  - 12/19/2021: Colonoscopy: Hemorrhoids, diverticulosis, small adenoma in the sigmoid colon.  Repeat in 5 years.   GERD-HRQL  Questionnaire Score: 13/50 (on PPI and H2RA)       Latest Ref Rng & Units 03/22/2023    9:55 AM 10/05/2022   12:00 AM 02/06/2022    9:52 AM  CBC  WBC 3.8 - 10.8 Thousand/uL 4.5  4.6  4.5   Hemoglobin 11.7 - 15.5 g/dL 25.3  66.4  40.3   Hematocrit 35.0 - 45.0 % 42.1  39.7  39.9   Platelets 140 - 400 Thousand/uL 210  178  203       Latest Ref Rng & Units 03/22/2023    9:55 AM 10/05/2022   12:00 AM 02/06/2022    9:52 AM  CMP  Glucose 65 - 99 mg/dL 55  87  91   BUN 7 - 25 mg/dL 26  20  25    Creatinine 0.60 - 1.00 mg/dL 4.74  2.59  5.63   Sodium 135 - 146 mmol/L 142  140  141   Potassium 3.5 - 5.3 mmol/L 3.8  3.5  3.5   Chloride 98 - 110 mmol/L 100  103  102   CO2 20 - 32 mmol/L 32  28  31   Calcium 8.6 - 10.4 mg/dL 87.5  9.5  9.7   Total Protein 6.1 - 8.1 g/dL 7.1  6.8  7.2   Total Bilirubin 0.2 - 1.2 mg/dL 0.7  0.5  0.8   AST 10 - 35 U/L 27  24  21    ALT 6 - 29 U/L 21  22  16       Past Medical History:  Diagnosis Date   Allergy    Anxiety    Basal cell carcinoma 2011   BCC  Bell palsy 02/04/2016   Left   Breast cancer (HCC)    Carpal tunnel syndrome on both sides 01/23/2017   Colon polyps    Complication of anesthesia    GERD (gastroesophageal reflux disease)    Hyperlipidemia    Hypertension 2005   IBS (irritable bowel syndrome)    Plantar fasciitis    Pneumonia    PONV (postoperative nausea and vomiting)      Past Surgical History:  Procedure Laterality Date   ABDOMINAL HYSTERECTOMY  11/27/1988   basal cell carcinoma  11/27/2009   nose   BREAST LUMPECTOMY WITH RADIOACTIVE SEED LOCALIZATION Left 12/05/2019   Procedure: LEFT BREAST LUMPECTOMY WITH RADIOACTIVE SEED LOCALIZATION;  Surgeon: Ovidio Kin, MD;  Location: [facility];  Service: General;  Laterality: Left;   MASTECTOMY, PARTIAL Left    TENDON REPAIR Bilateral    plantar fasciitis   TONSILLECTOMY     Family History  Problem Relation Age of Onset   Hypertension Mother    Heart  attack Mother    Hyperlipidemia Mother    Heart disease Mother 43       fatal MI   Stroke Mother    Arthritis Mother        rheumatoid   Cancer Father        lung   Hypertension Brother    Cancer Brother        throat, leukemia   Parkinson's disease Brother    Social History   Tobacco Use   Smoking status: Never   Smokeless tobacco: Never  Vaping Use   Vaping status: Never Used  Substance Use Topics   Alcohol use: Yes   Drug use: Never   Current Outpatient Medications  Medication Sig Dispense Refill   allopurinol (ZYLOPRIM) 300 MG tablet TAKE 1 TABLET BY MOUTH ONCE DAILY FOR  GOUT  PREVENTION 90 tablet 3   Ascorbic Acid (VITAMIN C) 1000 MG tablet Take 1,000 mg by mouth daily.     aspirin EC 81 MG tablet Take 1 tablet (81 mg total) by mouth daily.     Cholecalciferol (VITAMIN D PO) Take 10,000 Units by mouth daily.     Cyanocobalamin (VITAMIN B-12 PO) Take 1 tablet by mouth daily.     famotidine (PEPCID) 40 MG tablet Take 1 tablet by mouth at bedtime.     fenofibrate (TRICOR) 145 MG tablet Take  1 tablet  Daily for Triglycerides  (Blood Fats)                                                    /              TAKE                          BY                    MOUTH                   ONCE DAILY 90 tablet 3   Fexofenadine-Pseudoephedrine (ALLEGRA-D 24 HOUR PO) Take by mouth.     metFORMIN (GLUCOPHAGE-XR) 500 MG 24 hr tablet Take 2 tablets 2 x /day with Meals for Diabetes. 360 tablet 3   omeprazole (PRILOSEC) 40 MG capsule Take 1 capsule (40  mg total) by mouth daily. 90 capsule 3   phentermine (ADIPEX-P) 37.5 MG tablet Take  1 tablet  every Morning  for Dieting & Weight Loss 90 tablet 1   pravastatin (PRAVACHOL) 20 MG tablet 3 days a week     pyridOXINE (VITAMIN B-6) 100 MG tablet Take 100 mg by mouth daily.     topiramate (TOPAMAX) 50 MG tablet Take 1/2 to 1 tablet 2 x /day at Suppertime & Bedtime for Dieting & Weight Loss 180 tablet 1   triamterene-hydrochlorothiazide  (MAXZIDE-25) 37.5-25 MG tablet Take 1/2 to 1 tablet Daily for BP & Fluid Retention 90 tablet 3   zinc gluconate 50 MG tablet Take 50 mg by mouth daily.     No current facility-administered medications for this visit.   Allergies  Allergen Reactions   Dexamethasone Palpitations and Other (See Comments)    Jittery      Review of Systems: All systems reviewed and negative except where noted in HPI.     Physical Exam:    Wt Readings from Last 3 Encounters:  07/03/23 152 lb 2 oz (69 kg)  03/22/23 153 lb 9.6 oz (69.7 kg)  01/22/23 159 lb 11.2 oz (72.4 kg)    Ht 5\' 6"  (1.676 m)   Wt 152 lb 2 oz (69 kg)   BMI 24.55 kg/m  Constitutional:  Pleasant, in no acute distress. Psychiatric: Normal mood and affect. Behavior is normal. Neurological: Alert and oriented to person place and time. Skin: Skin is warm and dry. No rashes noted.   ASSESSMENT AND PLAN;   1) GERD 2) Hiatal hernia 72 year old female with increasingly bothersome reflux symptoms which have been refractory to high-dose PPI and H2 blockers.  Has clear objective evidence of reflux on previous esophagram.  Discussed the pathophysiology of reflux at length today.  Discussed treatment options, to include modifying medical management vs antireflux surgical options.  She is very interested in the latter.  Discussed Transoral Incisionless Fundoplication (TIF), Lynx, Nissen fundoplication, partial fundoplication, with plan for the following:  - EGD to evaluate for erosive esophagitis, degree of LES laxity, and measure grade/severity of hiatal hernia for preoperative assessment - Discussed possibility of concomitant laparoscopic hiatal hernia repair depending on size of hiatal hernia and degree of crural laxity noted on endoscopy - Continue omeprazole and Pepcid for the time being - Continue antireflux lifestyle/dietary modifications as currently doing - Discussed postoperative dietary and exercise/activity modifications  The  indications, risks, and benefits of EGD were explained to the patient in detail. Risks include but are not limited to bleeding, perforation, adverse reaction to medications, and cardiopulmonary compromise. Sequelae include but are not limited to the possibility of surgery, hospitalization, and mortality. The patient verbalized understanding and wished to proceed. All questions answered, referred to scheduler. Further recommendations pending results of the exam.     Tanya Cleverly, [name], FACG  07/03/2023, 9:19 AM   Tanya Cowboy, MD

## 2023-07-08 ENCOUNTER — Encounter: Payer: Self-pay | Admitting: Internal Medicine

## 2023-07-25 ENCOUNTER — Encounter: Payer: Self-pay | Admitting: Gastroenterology

## 2023-07-30 ENCOUNTER — Encounter: Payer: Self-pay | Admitting: Certified Registered Nurse Anesthetist

## 2023-08-03 ENCOUNTER — Encounter: Payer: PPO | Admitting: Gastroenterology

## 2023-08-05 ENCOUNTER — Encounter: Payer: Self-pay | Admitting: Gastroenterology

## 2023-08-06 ENCOUNTER — Encounter: Payer: Self-pay | Admitting: Gastroenterology

## 2023-08-06 ENCOUNTER — Ambulatory Visit (AMBULATORY_SURGERY_CENTER): Payer: PPO | Admitting: Gastroenterology

## 2023-08-06 VITALS — BP 163/91 | HR 79 | Temp 97.3°F | Resp 12 | Ht 66.0 in | Wt 152.0 lb

## 2023-08-06 DIAGNOSIS — I10 Essential (primary) hypertension: Secondary | ICD-10-CM | POA: Diagnosis not present

## 2023-08-06 DIAGNOSIS — F419 Anxiety disorder, unspecified: Secondary | ICD-10-CM | POA: Diagnosis not present

## 2023-08-06 DIAGNOSIS — K449 Diaphragmatic hernia without obstruction or gangrene: Secondary | ICD-10-CM | POA: Diagnosis not present

## 2023-08-06 DIAGNOSIS — K219 Gastro-esophageal reflux disease without esophagitis: Secondary | ICD-10-CM

## 2023-08-06 DIAGNOSIS — E785 Hyperlipidemia, unspecified: Secondary | ICD-10-CM | POA: Diagnosis not present

## 2023-08-06 MED ORDER — SODIUM CHLORIDE 0.9 % IV SOLN
500.0000 mL | INTRAVENOUS | Status: DC
Start: 2023-08-06 — End: 2023-09-21

## 2023-08-06 NOTE — Progress Notes (Signed)
1522 Robinul 0.1 mg IV given due large amount of secretions upon assessment.  MD made aware, vss

## 2023-08-06 NOTE — Progress Notes (Signed)
GASTROENTEROLOGY PROCEDURE H&P NOTE   Primary Care Physician: Lucky Cowboy, MD    Reason for Procedure:  GERD, reoperative evaluation, regurgitation, hiatal hernia  Plan:    EGD  Patient is appropriate for endoscopic procedure(s) in the ambulatory ([facility]) setting.  The nature of the procedure, as well as the risks, benefits, and alternatives were carefully and thoroughly reviewed with the patient. Ample time for discussion and questions allowed. The patient understood, was satisfied, and agreed to proceed.     HPI: [name] is a 72 y.o. female who presents for EGD for evaluation of GERD and hiatal hernia, along with preoperative evaluation for possible antireflux surgery.  Was last seen by me in the office on 07/03/2023.  No changes in clinical history in the interim.  Currently treated with omeprazole 40 mg daily and Pepcid 20 mg daily.   Past Medical History:  Diagnosis Date   Allergy    Anxiety    Basal cell carcinoma 2011   BCC   Bell palsy 02/04/2016   Left   Breast cancer (HCC)    Carpal tunnel syndrome on both sides 01/23/2017   Colon polyps    Complication of anesthesia    GERD (gastroesophageal reflux disease)    Hyperlipidemia    Hypertension 2005   IBS (irritable bowel syndrome)    Plantar fasciitis    Pneumonia    PONV (postoperative nausea and vomiting)     Past Surgical History:  Procedure Laterality Date   ABDOMINAL HYSTERECTOMY  11/27/1988   basal cell carcinoma  11/27/2009   nose   BREAST LUMPECTOMY WITH RADIOACTIVE SEED LOCALIZATION Left 12/05/2019   Procedure: LEFT BREAST LUMPECTOMY WITH RADIOACTIVE SEED LOCALIZATION;  Surgeon: Ovidio Kin, MD;  Location: [facility];  Service: General;  Laterality: Left;   MASTECTOMY, PARTIAL Left    TENDON REPAIR Bilateral    plantar fasciitis   TONSILLECTOMY      Prior to Admission medications   Medication Sig Start Date End Date Taking? Authorizing Provider  allopurinol  (ZYLOPRIM) 300 MG tablet TAKE 1 TABLET BY MOUTH ONCE DAILY FOR  GOUT  PREVENTION 11/18/22  Yes Lucky Cowboy, MD  Ascorbic Acid (VITAMIN C) 1000 MG tablet Take 1,000 mg by mouth daily.   Yes [provider]  Cholecalciferol (VITAMIN D PO) Take 10,000 Units by mouth daily.   Yes [provider]  Cyanocobalamin (VITAMIN B-12 PO) Take 1 tablet by mouth daily.   Yes [provider]  famotidine (PEPCID) 40 MG tablet Take 1 tablet by mouth at bedtime. 02/05/23 02/05/24 Yes [provider]  fenofibrate (TRICOR) 145 MG tablet Take  1 tablet  Daily for Triglycerides  (Blood Fats)                                                    /              TAKE                          BY                    MOUTH                   ONCE DAILY 07/06/22  Yes Lucky Cowboy, MD  Fexofenadine-Pseudoephedrine (ALLEGRA-D 24 HOUR PO) Take by mouth.   Yes [provider]  metFORMIN (GLUCOPHAGE-XR) 500 MG 24 hr tablet Take 2 tablets 2 x /day with Meals for Diabetes. 03/22/23  Yes Lucky Cowboy, MD  omeprazole (PRILOSEC) 40 MG capsule Take 1 capsule (40 mg total) by mouth daily. 03/22/23  Yes Lucky Cowboy, MD  pravastatin (PRAVACHOL) 20 MG tablet 3 days a week   Yes [provider]  pyridOXINE (VITAMIN B-6) 100 MG tablet Take 100 mg by mouth daily.   Yes [provider]  triamterene-hydrochlorothiazide (MAXZIDE-25) 37.5-25 MG tablet Take 1/2 to 1 tablet Daily for BP & Fluid Retention 01/04/21  Yes Lucky Cowboy, MD  zinc gluconate 50 MG tablet Take 50 mg by mouth daily.   Yes [provider]  aspirin EC 81 MG tablet Take 1 tablet (81 mg total) by mouth daily. 02/05/20   Serena Croissant, MD  phentermine (ADIPEX-P) 37.5 MG tablet Take  1 tablet  every Morning  for Dieting & Weight Loss 03/22/23   Lucky Cowboy, MD  topiramate (TOPAMAX) 50 MG tablet Take 1/2 to 1 tablet 2 x /day at Suppertime & Bedtime for Dieting & Weight Loss 03/22/23   Lucky Cowboy, MD     Current Outpatient Medications  Medication Sig Dispense Refill   allopurinol (ZYLOPRIM) 300 MG tablet TAKE 1 TABLET BY MOUTH ONCE DAILY FOR  GOUT  PREVENTION 90 tablet 3   Ascorbic Acid (VITAMIN C) 1000 MG tablet Take 1,000 mg by mouth daily.     Cholecalciferol (VITAMIN D PO) Take 10,000 Units by mouth daily.     Cyanocobalamin (VITAMIN B-12 PO) Take 1 tablet by mouth daily.     famotidine (PEPCID) 40 MG tablet Take 1 tablet by mouth at bedtime.     fenofibrate (TRICOR) 145 MG tablet Take  1 tablet  Daily for Triglycerides  (Blood Fats)                                                    /              TAKE                          BY                    MOUTH                   ONCE DAILY 90 tablet 3   Fexofenadine-Pseudoephedrine (ALLEGRA-D 24 HOUR PO) Take by mouth.     metFORMIN (GLUCOPHAGE-XR) 500 MG 24 hr tablet Take 2 tablets 2 x /day with Meals for Diabetes. 360 tablet 3   omeprazole (PRILOSEC) 40 MG capsule Take 1 capsule (40 mg total) by mouth daily. 90 capsule 3   pravastatin (PRAVACHOL) 20 MG tablet 3 days a week     pyridOXINE (VITAMIN B-6) 100 MG tablet Take 100 mg by mouth daily.     triamterene-hydrochlorothiazide (MAXZIDE-25) 37.5-25 MG tablet Take 1/2 to 1 tablet Daily for BP & Fluid Retention 90 tablet 3   zinc gluconate 50 MG tablet Take 50 mg by mouth daily.     aspirin EC 81 MG tablet Take 1 tablet (81 mg total) by mouth daily.     phentermine (ADIPEX-P) 37.5 MG  tablet Take  1 tablet  every Morning  for Dieting & Weight Loss 90 tablet 1   topiramate (TOPAMAX) 50 MG tablet Take 1/2 to 1 tablet 2 x /day at Suppertime & Bedtime for Dieting & Weight Loss 180 tablet 1   Current Facility-Administered Medications  Medication Dose Route Frequency Provider Last Rate Last Admin   0.9 %  sodium chloride infusion  500 mL Intravenous Continuous Elnoria Livingston V, DO        Allergies as of 08/06/2023 - Review Complete 08/06/2023  Allergen Reaction Noted   Dexamethasone  Palpitations and Other (See Comments) 10/05/2022    Family History  Problem Relation Age of Onset   Hypertension Mother    Heart attack Mother    Hyperlipidemia Mother    Heart disease Mother 76       fatal MI   Stroke Mother    Arthritis Mother        rheumatoid   Cancer Father        lung   Hypertension Brother    Cancer Brother        throat, leukemia   Parkinson's disease Brother     Social History   Socioeconomic History   Marital status: Married    Spouse name: Not on file   Number of children: Not on file   Years of education: Not on file   Highest education level: Not on file  Occupational History   Not on file  Tobacco Use   Smoking status: Never   Smokeless tobacco: Never  Vaping Use   Vaping status: Never Used  Substance and Sexual Activity   Alcohol use: Yes   Drug use: Never   Sexual activity: Not on file  Other Topics Concern   Not on file  Social History Narrative   Not on file   Social Determinants of Health   Financial Resource Strain: Not on file  Food Insecurity: Low Risk  (02/05/2023)   [facility]   Hunger Vital Sign    Worried About Running Out of Food in the Last Year: Patient unable to answer    Ran Out of Food in the Last Year: Never true  Transportation Needs: Not on file (02/05/2023)  Physical Activity: Not on file  Stress: Not on file  Social Connections: Not on file  Intimate Partner Violence: Not on file    Physical Exam: Vital signs in last 24 hours: @BP  (!) 157/76   Pulse 74   Temp (!) 97.3 F (36.3 C)   Ht 5\' 6"  (1.676 m)   Wt 152 lb (68.9 kg)   SpO2 96%   BMI 24.53 kg/m  GEN: NAD EYE: Sclerae anicteric ENT: MMM CV: Non-tachycardic Pulm: CTA b/l GI: Soft, NT/ND NEURO:  Alert & Oriented x 3   Doristine Locks, DO  Gastroenterology   08/06/2023 3:16 PM

## 2023-08-06 NOTE — Patient Instructions (Signed)
Please read handouts provided. Continue present medications. Resume previous diet. Schedule follow-up with Dr. Barron Alvine in clinic.   YOU HAD AN ENDOSCOPIC PROCEDURE TODAY AT THE [facility]:   Refer to the procedure report that was given to you for any specific questions about what was found during the examination.  If the procedure report does not answer your questions, please call your gastroenterologist to clarify.  If you requested that your care partner not be given the details of your procedure findings, then the procedure report has been included in a sealed envelope for you to review at your convenience later.  YOU SHOULD EXPECT: Some feelings of bloating in the abdomen. Passage of more gas than usual.  Walking can help get rid of the air that was put into your GI tract during the procedure and reduce the bloating. If you had a lower endoscopy (such as a colonoscopy or flexible sigmoidoscopy) you may notice spotting of blood in your stool or on the toilet paper. If you underwent a bowel prep for your procedure, you may not have a normal bowel movement for a few days.  Please Note:  You might notice some irritation and congestion in your nose or some drainage.  This is from the oxygen used during your procedure.  There is no need for concern and it should clear up in a day or so.  SYMPTOMS TO REPORT IMMEDIATELY:  Following upper endoscopy (EGD)  Vomiting of blood or coffee ground material  New chest pain or pain under the shoulder blades  Painful or persistently difficult swallowing  New shortness of breath  Fever of 100F or higher  Black, tarry-looking stools  For urgent or emergent issues, a gastroenterologist can be reached at any hour by calling [phone]. Do not use MyChart messaging for urgent concerns.    DIET:  We do recommend a small meal at first, but then you may proceed to your regular diet.  Drink plenty of fluids but you should avoid alcoholic  beverages for 24 hours.  ACTIVITY:  You should plan to take it easy for the rest of today and you should NOT DRIVE or use heavy machinery until tomorrow (because of the sedation medicines used during the test).    FOLLOW UP: Our staff will call the number listed on your records the next business day following your procedure.  We will call around 7:15- 8:00 am to check on you and address any questions or concerns that you may have regarding the information given to you following your procedure. If we do not reach you, we will leave a message.     If any biopsies were taken you will be contacted by phone or by letter within the next 1-3 weeks.  Please call us at [phone] if you have not heard about the biopsies in 3 weeks.    SIGNATURES/CONFIDENTIALITY: You and/or your care partner have signed paperwork which will be entered into your electronic medical record.  These signatures attest to the fact that that the information above on your After Visit Summary has been reviewed and is understood.  Full responsibility of the confidentiality of this discharge information lies with you and/or your care-partner.

## 2023-08-06 NOTE — Op Note (Signed)
[facility] Patient Name: [name] Procedure Date: 08/06/2023 3:12 PM MRN: [id number] Endoscopist: Doristine Locks , MD, [id number] Age: 72 Referring MD:  Date of Birth: [birth year] Gender: Female Account #: [id number] Procedure:                Upper GI endoscopy Indications:              Heartburn, Esophageal reflux, Preoperative                            assessment, Esophagram in 07/2021 with severe                            gastroesophageal reflux extending to the cervical                            esophagus. Medicines:                Monitored Anesthesia Care Procedure:                Pre-Anesthesia Assessment:                           - Prior to the procedure, a History and Physical                            was performed, and patient medications and                            allergies were reviewed. The patient's tolerance of                            previous anesthesia was also reviewed. The risks                            and benefits of the procedure and the sedation                            options and risks were discussed with the patient.                            All questions were answered, and informed consent                            was obtained. Prior Anticoagulants: The patient has                            taken no anticoagulant or antiplatelet agents. ASA                            Grade Assessment: II - A patient with mild systemic                            disease. After reviewing the risks and benefits,  the patient was deemed in satisfactory condition to                            undergo the procedure.                           After obtaining informed consent, the endoscope was                            passed under direct vision. Throughout the                            procedure, the patient's blood pressure, pulse, and                            oxygen saturations were monitored continuously. The                             Olympus scope [id number] was introduced through the                            mouth, and advanced to the third part of duodenum.                            The upper GI endoscopy was accomplished without                            difficulty. The patient tolerated the procedure                            well. Scope In: Scope Out: Findings:                 The upper third of the esophagus, middle third of                            the esophagus and lower third of the esophagus were                            normal.                           The Z-line was regular and was found 39 cm from the                            incisors.                           A sliding type 1-2 cm axial height hiatal hernia                            was present.                           The gastroesophageal flap valve was visualized  endoscopically and classified as Hill Grade III                            (minimal fold, loose to endoscope, hiatal hernia                            likely).                           The entire examined stomach was normal.                           The examined duodenum was normal. Complications:            No immediate complications. Estimated Blood Loss:     Estimated blood loss: none. Impression:               - Normal upper third of esophagus, middle third of                            esophagus and lower third of esophagus.                           - Z-line regular, 39 cm from the incisors.                           - 1-2 cm axial height sliding hiatal hernia.                           - Gastroesophageal flap valve classified as Hill                            Grade III (minimal fold, loose to endoscope, hiatal                            hernia likely).                           - Normal stomach.                           - Normal examined duodenum.                           - No specimens collected. Recommendation:            - Patient has a contact number available for                            emergencies. The signs and symptoms of potential                            delayed complications were discussed with the                            patient. Return to normal activities tomorrow.  Written discharge instructions were provided to the                            patient.                           - Resume previous diet.                           - Continue present medications.                           - Based on the degree of LES laxity, the transverse                            width of the hiatal hernia, and advanced Hill grade                            3 valve, if planning antireflux surgery, I                            recommend concomitant laparoscopic hiatal hernia                            repair with Transoral Incisionless Fundoplication                            (cTIF) rather than TIF alone.                           - Schedule follow-up appointment with me in the GI                            Clinic. Doristine Locks, MD 08/06/2023 3:39:05 PM

## 2023-08-07 ENCOUNTER — Telehealth: Payer: Self-pay

## 2023-08-07 ENCOUNTER — Telehealth: Payer: Self-pay | Admitting: *Deleted

## 2023-08-07 ENCOUNTER — Encounter: Payer: Self-pay | Admitting: Gastroenterology

## 2023-08-07 NOTE — Telephone Encounter (Signed)
  Follow up Call-     08/06/2023    2:21 PM  Call back number  Post procedure Call Back phone  # [phone]  Permission to leave phone message Yes     Patient questions:  Do you have a fever, pain , or abdominal swelling? No. Pain Score  0 *  Have you tolerated food without any problems? Yes.    Have you been able to return to your normal activities? Yes.    Do you have any questions about your discharge instructions: Diet   No. Medications  No. Follow up visit  No.  Do you have questions or concerns about your Care? No.  Actions: * If pain score is 4 or above: No action needed, pain <4.

## 2023-08-07 NOTE — Telephone Encounter (Signed)
-----   Message from Shellia Cleverly sent at 08/06/2023  4:41 PM EDT ----- Can you please send a referral for this patient to Dr. Andrey Campanile for consideration of concomitant laparoscopic hiatal hernia repair and TIF.    Her workup is otherwise completely done.  I will check with Dr. Andrey Campanile to make sure he is ok with it, but I think we can actually start looking to see if we can get her on the OR schedule in October for either the 10th or 24th since we already have patients in the OR those days.  I know it is a long shot, but can see if we have any more time on our block?  Thanks.

## 2023-08-07 NOTE — Telephone Encounter (Signed)
I have sent a note to Sheralyn Boatman, surgery scheduling coordinator at [facility] regarding hiatal hernia repair/cTIF to be completed 09/20/23. Will contact hospital to add TIF procedure once patient has been placed on schedule for [facility] repair by Dr Andrey Campanile.

## 2023-08-22 ENCOUNTER — Other Ambulatory Visit: Payer: Self-pay | Admitting: *Deleted

## 2023-08-22 DIAGNOSIS — K449 Diaphragmatic hernia without obstruction or gangrene: Secondary | ICD-10-CM

## 2023-08-22 DIAGNOSIS — K219 Gastro-esophageal reflux disease without esophagitis: Secondary | ICD-10-CM

## 2023-08-22 NOTE — Telephone Encounter (Signed)
I have spoken to [place] at [facility] Scheduling to add endoscopy and TIF panels on to surgery already scheduled with Dr Andrey Campanile on 09/20/23 at 230 pm.   I have spoken to patient to advise that this procedure will occur immediately following Dr Tawana Scale portion of surgery and I have given her verbal post-op diet/weight restrictions. In addition, I have made these restrictions available to be seen in mychart and have sent a paper copy of instructions to patient's home address.   Patient is scheduled for 2 week TIF follow up with Dr Barron Alvine on 10/09/23 at 240 pm and patient also verbalizes understanding of this. She denies any additional questions at this time.

## 2023-08-22 NOTE — Telephone Encounter (Signed)
Received a call from [place] with [facility]. They would like to add patient on for cTIF on 09/20/23 at 2:30 pm. I have provided Morrie Sheldon with your direct office contact information for future scheduling. Thank you!

## 2023-08-31 NOTE — Progress Notes (Signed)
Second request for pre op orders in [facility]: Spoke with Toniann Fail at [facility].

## 2023-09-02 NOTE — Progress Notes (Signed)
Anesthesia Review:  PCP: [facility] Cowboy LOV 03/22/23  Cardiologist : Chest x-ray : EKG : 03/22/23  Echo : Stress test: Cardiac Cath :  Activity level:  Sleep Study/ CPAP : Fasting Blood Sugar :      / Checks Blood Sugar -- times a day:   Blood Thinner/ Instructions /Last Dose: ASA / Instructions/ Last Dose :    81 mg aspirin    DM- type  Hgba1c-  Phentermine-  Metformin0 none day of surgery

## 2023-09-02 NOTE — Patient Instructions (Signed)
SURGICAL WAITING ROOM VISITATION  Patients having surgery or a procedure may have no more than 2 support people in the waiting area - these visitors may rotate.    Children under the age of 19 must have an adult with them who is not the patient.  Due to an increase in RSV and influenza rates and associated hospitalizations, children ages 85 and under may not visit patients in [facility] hospitals.  If the patient needs to stay at the hospital during part of their recovery, the visitor guidelines for inpatient rooms apply. Pre-op nurse will coordinate an appropriate time for 1 support person to accompany patient in pre-op.  This support person may not rotate.    Please refer to the [facility] website for the visitor guidelines for Inpatients (after your surgery is over and you are in a regular room).       Your procedure is scheduled on:  09/20/23    Report to [facility] Main Entrance    Report to admitting at AM   Call this number if you have problems the morning of surgery [phone]   Do not eat food :After Midnight.   After Midnight you may have the following liquids until ______ AM/ PM DAY OF SURGERY  Water Non-Citrus Juices (without pulp, NO RED-Apple, White grape, White cranberry) Black Coffee (NO MILK/CREAM OR CREAMERS, sugar ok)  Clear Tea (NO MILK/CREAM OR CREAMERS, sugar ok) regular and decaf                             Plain Jell-O (NO RED)                                           Fruit ices (not with fruit pulp, NO RED)                                     Popsicles (NO RED)                                                               Sports drinks like Gatorade (NO RED)              Drink 2 Ensure/G2 drinks AT 10:00 PM the night before surgery.        The day of surgery:  Drink ONE (1) Pre-Surgery Clear Ensure or G2 at AM the morning of surgery. Drink in one sitting. Do not sip.  This drink was given to you during your hospital  pre-op appointment  visit. Nothing else to drink after completing the  Pre-Surgery Clear Ensure or G2.          If you have questions, please contact your surgeon's office.   FOLLOW BOWEL PREP AND ANY ADDITIONAL PRE OP INSTRUCTIONS YOU RECEIVED FROM YOUR SURGEON'S OFFICE!!!     Oral Hygiene is also important to reduce your risk of infection.  Remember - BRUSH YOUR TEETH THE MORNING OF SURGERY WITH YOUR REGULAR TOOTHPASTE  DENTURES WILL BE REMOVED PRIOR TO SURGERY PLEASE DO NOT APPLY "Poly grip" OR ADHESIVES!!!   Do NOT smoke after Midnight   Stop all vitamins and herbal supplements 7 days before surgery.   Take these medicines the morning of surgery with A SIP OF WATER:   DO NOT TAKE ANY ORAL DIABETIC MEDICATIONS DAY OF YOUR SURGERY  Bring CPAP mask and tubing day of surgery.                              You may not have any metal on your body including hair pins, jewelry, and body piercing             Do not wear make-up, lotions, powders, perfumes/cologne, or deodorant  Do not wear nail polish including gel and S&S, artificial/acrylic nails, or any other type of covering on natural nails including finger and toenails. If you have artificial nails, gel coating, etc. that needs to be removed by a nail salon please have this removed prior to surgery or surgery may need to be canceled/ delayed if the surgeon/ anesthesia feels like they are unable to be safely monitored.   Do not shave  48 hours prior to surgery.               Men may shave face and neck.   Do not bring valuables to the hospital. [facility] IS NOT             RESPONSIBLE   FOR VALUABLES.   Contacts, glasses, dentures or bridgework may not be worn into surgery.   Bring small overnight bag day of surgery.   DO NOT BRING YOUR HOME MEDICATIONS TO THE HOSPITAL. PHARMACY WILL DISPENSE MEDICATIONS LISTED ON YOUR MEDICATION LIST TO YOU DURING YOUR ADMISSION IN THE HOSPITAL!    Patients discharged on  the day of surgery will not be allowed to drive home.  Someone NEEDS to stay with you for the first 24 hours after anesthesia.   Special Instructions: Bring a copy of your healthcare power of attorney and living will documents the day of surgery if you haven't scanned them before.              Please read over the following fact sheets you were given: IF YOU HAVE QUESTIONS ABOUT YOUR PRE-OP INSTRUCTIONS PLEASE CALL [phone]   If you received a COVID test during your pre-op visit  it is requested that you wear a mask when out in public, stay away from anyone that may not be feeling well and notify your surgeon if you develop symptoms. If you test positive for Covid or have been in contact with anyone that has tested positive in the last 10 days please notify you surgeon.    [facility] - Preparing for Surgery Before surgery, you can play an important role.  Because skin is not sterile, your skin needs to be as free of germs as possible.  You can reduce the number of germs on your skin by washing with CHG (chlorahexidine gluconate) soap before surgery.  CHG is an antiseptic cleaner which kills germs and bonds with the skin to continue killing germs even after washing. Please DO NOT use if you have an allergy to CHG or antibacterial soaps.  If your skin becomes reddened/irritated stop using the CHG and inform your nurse when you arrive at  Short Stay. Do not shave (including legs and underarms) for at least 48 hours prior to the first CHG shower.  You may shave your face/neck. Please follow these instructions carefully:  1.  Shower with CHG Soap the night before surgery and the  morning of Surgery.  2.  If you choose to wash your hair, wash your hair first as usual with your  normal  shampoo.  3.  After you shampoo, rinse your hair and body thoroughly to remove the  shampoo.                           4.  Use CHG as you would any other liquid soap.  You can apply chg directly  to the skin and wash                        Gently with a scrungie or clean washcloth.  5.  Apply the CHG Soap to your body ONLY FROM THE NECK DOWN.   Do not use on face/ open                           Wound or open sores. Avoid contact with eyes, ears mouth and genitals (private parts).                       Wash face,  Genitals (private parts) with your normal soap.             6.  Wash thoroughly, paying special attention to the area where your surgery  will be performed.  7.  Thoroughly rinse your body with warm water from the neck down.  8.  DO NOT shower/wash with your normal soap after using and rinsing off  the CHG Soap.                9.  Pat yourself dry with a clean towel.            10.  Wear clean pajamas.            11.  Place clean sheets on your bed the night of your first shower and do not  sleep with pets. Day of Surgery : Do not apply any lotions/deodorants the morning of surgery.  Please wear clean clothes to the hospital/surgery center.  FAILURE TO FOLLOW THESE INSTRUCTIONS MAY RESULT IN THE CANCELLATION OF YOUR SURGERY PATIENT SIGNATURE_________________________________  NURSE SIGNATURE__________________________________  ________________________________________________________________________

## 2023-09-03 ENCOUNTER — Ambulatory Visit: Payer: Self-pay | Admitting: General Surgery

## 2023-09-03 DIAGNOSIS — K760 Fatty (change of) liver, not elsewhere classified: Secondary | ICD-10-CM

## 2023-09-03 DIAGNOSIS — K219 Gastro-esophageal reflux disease without esophagitis: Secondary | ICD-10-CM | POA: Diagnosis not present

## 2023-09-03 DIAGNOSIS — K449 Diaphragmatic hernia without obstruction or gangrene: Secondary | ICD-10-CM | POA: Diagnosis not present

## 2023-09-03 NOTE — Telephone Encounter (Signed)
I do not. It was actually a work in appointment.

## 2023-09-03 NOTE — Telephone Encounter (Signed)
PT is calling to find out if we have anything earlier than 240pm on 11/12 follow up. Please advise.

## 2023-09-04 ENCOUNTER — Encounter (HOSPITAL_COMMUNITY): Payer: Self-pay

## 2023-09-04 ENCOUNTER — Encounter (HOSPITAL_COMMUNITY)
Admission: RE | Admit: 2023-09-04 | Discharge: 2023-09-04 | Disposition: A | Discharge status: Home / Self Care | Payer: PPO | Source: Ambulatory Visit | Attending: General Surgery

## 2023-09-04 ENCOUNTER — Other Ambulatory Visit: Payer: Self-pay

## 2023-09-04 VITALS — BP 140/81 | HR 77 | Temp 98.2°F | Resp 16 | Ht 68.0 in | Wt 156.0 lb

## 2023-09-04 DIAGNOSIS — Z01812 Encounter for preprocedural laboratory examination: Secondary | ICD-10-CM | POA: Insufficient documentation

## 2023-09-04 DIAGNOSIS — Z01818 Encounter for other preprocedural examination: Secondary | ICD-10-CM

## 2023-09-04 DIAGNOSIS — K760 Fatty (change of) liver, not elsewhere classified: Secondary | ICD-10-CM | POA: Insufficient documentation

## 2023-09-04 HISTORY — DX: Unspecified osteoarthritis, unspecified site: M19.90

## 2023-09-04 HISTORY — DX: Prediabetes: R73.03

## 2023-09-04 LAB — COMPREHENSIVE METABOLIC PANEL
ALT: 27 U/L (ref 0–44)
AST: 31 U/L (ref 15–41)
Albumin: 4.4 g/dL (ref 3.5–5.0)
Alkaline Phosphatase: 86 U/L (ref 38–126)
Anion gap: 9 (ref 5–15)
BUN: 18 mg/dL (ref 8–23)
CO2: 28 mmol/L (ref 22–32)
Calcium: 9.4 mg/dL (ref 8.9–10.3)
Chloride: 102 mmol/L (ref 98–111)
Creatinine, Ser: 0.94 mg/dL (ref 0.44–1.00)
GFR, Estimated: >60 mL/min (ref >60)
Glucose, Bld: 98 mg/dL (ref 70–99)
Potassium: 3.7 mmol/L (ref 3.5–5.1)
Sodium: 139 mmol/L (ref 135–145)
Total Bilirubin: 0.6 mg/dL (ref 0.3–1.2)
Total Protein: 7.4 g/dL (ref 6.5–8.1)

## 2023-09-04 LAB — CBC WITH DIFFERENTIAL/PLATELET
Abs Immature Granulocytes: 0.02 10*3/uL (ref 0.00–0.07)
Basophils Absolute: 0 10*3/uL (ref 0.0–0.1)
Basophils Relative: 1 %
Eosinophils Absolute: 0.2 10*3/uL (ref 0.0–0.5)
Eosinophils Relative: 4 %
HCT: 41.8 % (ref 36.0–46.0)
Hemoglobin: 14.3 g/dL (ref 12.0–15.0)
Immature Granulocytes: 0 %
Lymphocytes Relative: 34 %
Lymphs Abs: 1.6 10*3/uL (ref 0.7–4.0)
MCH: 31.7 pg (ref 26.0–34.0)
MCHC: 34.2 g/dL (ref 30.0–36.0)
MCV: 92.7 fL (ref 80.0–100.0)
Monocytes Absolute: 0.4 10*3/uL (ref 0.1–1.0)
Monocytes Relative: 8 %
Neutro Abs: 2.5 10*3/uL (ref 1.7–7.7)
Neutrophils Relative %: 53 %
Platelets: 179 10*3/uL (ref 150–400)
RBC: 4.51 MIL/uL (ref 3.87–5.11)
RDW: 14 % (ref 11.5–15.5)
WBC: 4.6 10*3/uL (ref 4.0–10.5)
nRBC: 0 % (ref 0.0–0.2)

## 2023-09-04 NOTE — Telephone Encounter (Signed)
Called PT and left VM to advise we had no other appointments available.

## 2023-09-17 DIAGNOSIS — M7062 Trochanteric bursitis, left hip: Secondary | ICD-10-CM | POA: Diagnosis not present

## 2023-09-17 DIAGNOSIS — M7061 Trochanteric bursitis, right hip: Secondary | ICD-10-CM | POA: Diagnosis not present

## 2023-09-19 ENCOUNTER — Other Ambulatory Visit: Payer: Self-pay | Admitting: Internal Medicine

## 2023-09-19 DIAGNOSIS — Z79899 Other long term (current) drug therapy: Secondary | ICD-10-CM

## 2023-09-20 ENCOUNTER — Ambulatory Visit (HOSPITAL_BASED_OUTPATIENT_CLINIC_OR_DEPARTMENT_OTHER): Payer: PPO | Admitting: Physician Assistant

## 2023-09-20 ENCOUNTER — Ambulatory Visit (HOSPITAL_COMMUNITY): Payer: PPO | Admitting: Physician Assistant

## 2023-09-20 ENCOUNTER — Encounter (HOSPITAL_COMMUNITY): Payer: Self-pay | Admitting: General Surgery

## 2023-09-20 ENCOUNTER — Observation Stay (HOSPITAL_COMMUNITY)
Admission: RE | Admit: 2023-09-20 | Discharge: 2023-09-21 | Disposition: A | Discharge status: Home / Self Care | Payer: PPO | Source: Ambulatory Visit | Attending: Internal Medicine | Admitting: Internal Medicine

## 2023-09-20 ENCOUNTER — Encounter (HOSPITAL_COMMUNITY)
Admission: RE | Disposition: A | Discharge status: Home / Self Care | Payer: Self-pay | Source: Ambulatory Visit | Attending: Internal Medicine

## 2023-09-20 DIAGNOSIS — Z85828 Personal history of other malignant neoplasm of skin: Secondary | ICD-10-CM | POA: Insufficient documentation

## 2023-09-20 DIAGNOSIS — I10 Essential (primary) hypertension: Secondary | ICD-10-CM | POA: Diagnosis not present

## 2023-09-20 DIAGNOSIS — K219 Gastro-esophageal reflux disease without esophagitis: Secondary | ICD-10-CM | POA: Diagnosis not present

## 2023-09-20 DIAGNOSIS — R7309 Other abnormal glucose: Secondary | ICD-10-CM | POA: Diagnosis not present

## 2023-09-20 DIAGNOSIS — K449 Diaphragmatic hernia without obstruction or gangrene: Principal | ICD-10-CM

## 2023-09-20 DIAGNOSIS — Z853 Personal history of malignant neoplasm of breast: Secondary | ICD-10-CM | POA: Insufficient documentation

## 2023-09-20 DIAGNOSIS — Z01818 Encounter for other preprocedural examination: Secondary | ICD-10-CM

## 2023-09-20 HISTORY — PX: SAVORY DILATION: SHX5439

## 2023-09-20 HISTORY — PX: HIATAL HERNIA REPAIR: SHX195

## 2023-09-20 HISTORY — PX: ESOPHAGOGASTRODUODENOSCOPY: SHX5428

## 2023-09-20 HISTORY — DX: Prediabetes: R73.03

## 2023-09-20 HISTORY — PX: TRANSORAL INCISIONLESS FUNDOPLICATION: SHX6840

## 2023-09-20 LAB — HEMOGLOBIN A1C
Hgb A1c MFr Bld: 5.5 % (ref 4.8–5.6)
Mean Plasma Glucose: 111.15 mg/dL

## 2023-09-20 LAB — GLUCOSE, CAPILLARY
Glucose-Capillary: 100 mg/dL — ABNORMAL HIGH (ref 70–99)
Glucose-Capillary: 159 mg/dL — ABNORMAL HIGH (ref 70–99)

## 2023-09-20 SURGERY — REPAIR, HERNIA, HIATAL, LAPAROSCOPIC
Anesthesia: General

## 2023-09-20 MED ORDER — PROPOFOL 500 MG/50ML IV EMUL
INTRAVENOUS | Status: DC | PRN
  Administered 2023-09-20: 50 ug/kg/min via INTRAVENOUS

## 2023-09-20 MED ORDER — PROPOFOL 10 MG/ML IV BOLUS
INTRAVENOUS | Status: DC | PRN
  Administered 2023-09-20: 140 mg via INTRAVENOUS

## 2023-09-20 MED ORDER — CEFAZOLIN SODIUM-DEXTROSE 2-4 GM/100ML-% IV SOLN
2.0000 g | Freq: Once | INTRAVENOUS | Status: AC
  Administered 2023-09-20: 2 g via INTRAVENOUS
  Filled 2023-09-20: qty 100

## 2023-09-20 MED ORDER — HYDRALAZINE HCL 20 MG/ML IJ SOLN: 5.0000 mg | Freq: Four times a day (QID) | INTRAMUSCULAR | Status: DC | PRN

## 2023-09-20 MED ORDER — 0.9 % SODIUM CHLORIDE (POUR BTL) OPTIME
TOPICAL | Status: DC | PRN
  Administered 2023-09-20: 500 mL

## 2023-09-20 MED ORDER — INSULIN ASPART 100 UNIT/ML IJ SOLN
0.0000 [IU] | INTRAMUSCULAR | Status: DC
  Administered 2023-09-21: 2 [IU] via SUBCUTANEOUS

## 2023-09-20 MED ORDER — CEFAZOLIN SODIUM-DEXTROSE 2-4 GM/100ML-% IV SOLN: 2.0000 g | INTRAVENOUS | Status: DC

## 2023-09-20 MED ORDER — ROCURONIUM BROMIDE 10 MG/ML (PF) SYRINGE
PREFILLED_SYRINGE | INTRAVENOUS | Status: AC
  Filled 2023-09-20: qty 10

## 2023-09-20 MED ORDER — ACETAMINOPHEN 500 MG PO TABS
1000.0000 mg | ORAL_TABLET | ORAL | Status: AC
  Administered 2023-09-20: 1000 mg via ORAL
  Filled 2023-09-20: qty 2

## 2023-09-20 MED ORDER — HYDROMORPHONE HCL 1 MG/ML IJ SOLN
0.5000 mg | INTRAMUSCULAR | Status: DC | PRN
  Administered 2023-09-20 (×2): 1 mg via INTRAVENOUS
  Filled 2023-09-20 (×2): qty 1

## 2023-09-20 MED ORDER — ACETAMINOPHEN 325 MG PO TABS
650.0000 mg | ORAL_TABLET | Freq: Four times a day (QID) | ORAL | Status: DC | PRN
Start: 2023-09-20 — End: 2023-09-21

## 2023-09-20 MED ORDER — DEXMEDETOMIDINE HCL IN NACL 80 MCG/20ML IV SOLN
INTRAVENOUS | Status: DC | PRN
Start: 2023-09-20 — End: 2023-09-20
  Administered 2023-09-20: 10 ug via INTRAVENOUS

## 2023-09-20 MED ORDER — ACETAMINOPHEN 650 MG RE SUPP: 650.0000 mg | Freq: Four times a day (QID) | RECTAL | Status: DC | PRN

## 2023-09-20 MED ORDER — ALLOPURINOL 100 MG PO TABS
100.0000 mg | ORAL_TABLET | Freq: Every day | ORAL | Status: DC
  Administered 2023-09-21: 100 mg via ORAL
  Filled 2023-09-20: qty 1

## 2023-09-20 MED ORDER — MIDAZOLAM HCL 2 MG/2ML IJ SOLN
INTRAMUSCULAR | Status: AC
  Filled 2023-09-20: qty 2

## 2023-09-20 MED ORDER — FENTANYL CITRATE (PF) 100 MCG/2ML IJ SOLN
INTRAMUSCULAR | Status: DC | PRN
  Administered 2023-09-20 (×4): 50 ug via INTRAVENOUS

## 2023-09-20 MED ORDER — ONDANSETRON HCL 4 MG/2ML IJ SOLN
4.0000 mg | Freq: Four times a day (QID) | INTRAMUSCULAR | Status: DC
  Administered 2023-09-20 – 2023-09-21 (×3): 4 mg via INTRAVENOUS
  Filled 2023-09-20 (×3): qty 2

## 2023-09-20 MED ORDER — SIMETHICONE 80 MG PO CHEW
80.0000 mg | CHEWABLE_TABLET | Freq: Four times a day (QID) | ORAL | Status: DC | PRN
  Administered 2023-09-20: 80 mg via ORAL
  Filled 2023-09-20: qty 1

## 2023-09-20 MED ORDER — LACTATED RINGERS IV SOLN: INTRAVENOUS | Status: DC

## 2023-09-20 MED ORDER — LIDOCAINE VISCOUS HCL 2 % MT SOLN: 5.0000 mL | Freq: Three times a day (TID) | OROMUCOSAL | Status: DC | PRN

## 2023-09-20 MED ORDER — ORAL CARE MOUTH RINSE: 15.0000 mL | Freq: Once | OROMUCOSAL | Status: AC

## 2023-09-20 MED ORDER — ACETAMINOPHEN-CODEINE 120-12 MG/5ML PO SOLN: 15.0000 mL | ORAL | Status: DC | PRN

## 2023-09-20 MED ORDER — SUGAMMADEX SODIUM 200 MG/2ML IV SOLN
INTRAVENOUS | Status: DC | PRN
  Administered 2023-09-20: 150 mg via INTRAVENOUS

## 2023-09-20 MED ORDER — LIDOCAINE HCL (CARDIAC) PF 100 MG/5ML IV SOSY
PREFILLED_SYRINGE | INTRAVENOUS | Status: DC | PRN
  Administered 2023-09-20: 100 mg via INTRAVENOUS

## 2023-09-20 MED ORDER — ONDANSETRON HCL 4 MG PO TABS: 4.0000 mg | ORAL_TABLET | Freq: Four times a day (QID) | ORAL | Status: DC | PRN

## 2023-09-20 MED ORDER — DEXAMETHASONE SODIUM PHOSPHATE 4 MG/ML IJ SOLN
4.0000 mg | INTRAMUSCULAR | Status: AC
  Administered 2023-09-20: 4 mg via INTRAVENOUS
  Filled 2023-09-20: qty 1

## 2023-09-20 MED ORDER — SCOPOLAMINE 1 MG/3DAYS TD PT72
1.0000 | MEDICATED_PATCH | Freq: Once | TRANSDERMAL | Status: DC
  Administered 2023-09-20: 1.5 mg via TRANSDERMAL
  Filled 2023-09-20: qty 1

## 2023-09-20 MED ORDER — ONDANSETRON HCL 4 MG/2ML IJ SOLN
INTRAMUSCULAR | Status: AC
  Filled 2023-09-20: qty 2

## 2023-09-20 MED ORDER — ONDANSETRON HCL 4 MG/2ML IJ SOLN: 4.0000 mg | Freq: Four times a day (QID) | INTRAMUSCULAR | Status: DC | PRN

## 2023-09-20 MED ORDER — TRAZODONE HCL 50 MG PO TABS
25.0000 mg | ORAL_TABLET | Freq: Every evening | ORAL | Status: DC | PRN
  Administered 2023-09-20: 25 mg via ORAL
  Filled 2023-09-20: qty 1

## 2023-09-20 MED ORDER — ALBUTEROL SULFATE (2.5 MG/3ML) 0.083% IN NEBU: 2.5000 mg | INHALATION_SOLUTION | RESPIRATORY_TRACT | Status: DC | PRN

## 2023-09-20 MED ORDER — SODIUM CHLORIDE 0.9 % IV SOLN: INTRAVENOUS | Status: DC

## 2023-09-20 MED ORDER — CHLORHEXIDINE GLUCONATE CLOTH 2 % EX PADS: 6.0000 | MEDICATED_PAD | Freq: Once | CUTANEOUS | Status: DC

## 2023-09-20 MED ORDER — DEXMEDETOMIDINE HCL IN NACL 80 MCG/20ML IV SOLN
INTRAVENOUS | Status: AC
  Filled 2023-09-20: qty 40

## 2023-09-20 MED ORDER — ACETAMINOPHEN 10 MG/ML IV SOLN
1000.0000 mg | Freq: Once | INTRAVENOUS | Status: DC | PRN
Start: 2023-09-20 — End: 2023-09-20

## 2023-09-20 MED ORDER — ONDANSETRON HCL 4 MG/2ML IJ SOLN
INTRAMUSCULAR | Status: DC | PRN
  Administered 2023-09-20: 4 mg via INTRAVENOUS

## 2023-09-20 MED ORDER — TRIAMTERENE-HCTZ 37.5-25 MG PO TABS
0.5000 | ORAL_TABLET | Freq: Every day | ORAL | Status: DC
  Administered 2023-09-21: 0.5 via ORAL
  Filled 2023-09-20: qty 1

## 2023-09-20 MED ORDER — BUPIVACAINE LIPOSOME 1.3 % IJ SUSP
INTRAMUSCULAR | Status: AC
Start: 2023-09-20 — End: ?
  Filled 2023-09-20: qty 20

## 2023-09-20 MED ORDER — FENTANYL CITRATE PF 50 MCG/ML IJ SOSY
25.0000 ug | PREFILLED_SYRINGE | INTRAMUSCULAR | Status: DC | PRN
  Administered 2023-09-20: 25 ug via INTRAVENOUS

## 2023-09-20 MED ORDER — FENTANYL CITRATE (PF) 100 MCG/2ML IJ SOLN
INTRAMUSCULAR | Status: AC
  Filled 2023-09-20: qty 2

## 2023-09-20 MED ORDER — CHLORHEXIDINE GLUCONATE 0.12 % MT SOLN
15.0000 mL | Freq: Once | OROMUCOSAL | Status: AC
  Administered 2023-09-20: 15 mL via OROMUCOSAL

## 2023-09-20 MED ORDER — PANTOPRAZOLE SODIUM 40 MG PO TBEC
40.0000 mg | DELAYED_RELEASE_TABLET | Freq: Two times a day (BID) | ORAL | Status: DC
  Administered 2023-09-20 – 2023-09-21 (×2): 40 mg via ORAL
  Filled 2023-09-20 (×2): qty 1

## 2023-09-20 MED ORDER — EPHEDRINE SULFATE (PRESSORS) 50 MG/ML IJ SOLN
INTRAMUSCULAR | Status: DC | PRN
  Administered 2023-09-20: 5 mg via INTRAVENOUS

## 2023-09-20 MED ORDER — FENTANYL CITRATE PF 50 MCG/ML IJ SOSY
PREFILLED_SYRINGE | INTRAMUSCULAR | Status: AC
  Filled 2023-09-20: qty 1

## 2023-09-20 MED ORDER — BUPIVACAINE-EPINEPHRINE (PF) 0.25% -1:200000 IJ SOLN
INTRAMUSCULAR | Status: DC | PRN
  Administered 2023-09-20: 50 mL

## 2023-09-20 MED ORDER — METOCLOPRAMIDE HCL 5 MG/ML IJ SOLN
10.0000 mg | Freq: Four times a day (QID) | INTRAMUSCULAR | Status: DC
  Administered 2023-09-20 – 2023-09-21 (×3): 10 mg via INTRAVENOUS
  Filled 2023-09-20 (×3): qty 2

## 2023-09-20 MED ORDER — MIDAZOLAM HCL 5 MG/5ML IJ SOLN
INTRAMUSCULAR | Status: DC | PRN
  Administered 2023-09-20: 2 mg via INTRAVENOUS

## 2023-09-20 MED ORDER — ONDANSETRON HCL 4 MG/2ML IJ SOLN
4.0000 mg | Freq: Once | INTRAMUSCULAR | Status: AC
  Administered 2023-09-20: 4 mg via INTRAVENOUS
  Filled 2023-09-20: qty 2

## 2023-09-20 MED ORDER — ROCURONIUM BROMIDE 100 MG/10ML IV SOLN
INTRAVENOUS | Status: DC | PRN
  Administered 2023-09-20: 60 mg via INTRAVENOUS

## 2023-09-20 MED ORDER — OXYCODONE HCL 5 MG PO TABS
5.0000 mg | ORAL_TABLET | ORAL | Status: DC | PRN
  Administered 2023-09-21 (×2): 5 mg via ORAL
  Filled 2023-09-20 (×2): qty 1

## 2023-09-20 MED ORDER — DEXAMETHASONE SODIUM PHOSPHATE 10 MG/ML IJ SOLN
INTRAMUSCULAR | Status: AC
  Filled 2023-09-20: qty 1

## 2023-09-20 MED ORDER — FAMOTIDINE IN NACL 20-0.9 MG/50ML-% IV SOLN
20.0000 mg | Freq: Once | INTRAVENOUS | Status: AC
  Administered 2023-09-20: 20 mg via INTRAVENOUS
  Filled 2023-09-20 (×2): qty 50

## 2023-09-20 MED ORDER — DROPERIDOL 2.5 MG/ML IJ SOLN: 0.6250 mg | Freq: Once | INTRAMUSCULAR | Status: DC | PRN

## 2023-09-20 MED ORDER — BUPIVACAINE-EPINEPHRINE 0.25% -1:200000 IJ SOLN
INTRAMUSCULAR | Status: AC
  Filled 2023-09-20: qty 1

## 2023-09-20 MED ORDER — SCOPOLAMINE 1 MG/3DAYS TD PT72: 1.0000 | MEDICATED_PATCH | TRANSDERMAL | Status: DC

## 2023-09-20 SURGICAL SUPPLY — 59 items
ANTIFOG SOL W/FOAM PAD STRL (MISCELLANEOUS) ×2
APL PRP STRL LF DISP 70% ISPRP (MISCELLANEOUS) ×2
APPLIER CLIP ROT 10 11.4 M/L (STAPLE)
APR CLP MED LRG 11.4X10 (STAPLE)
BAG COUNTER SPONGE SURGICOUNT (BAG) IMPLANT
BAG SPNG CNTER NS LX DISP (BAG)
BLADE SURG SZ11 CARB STEEL (BLADE) ×2 IMPLANT
CABLE HIGH FREQUENCY MONO STRZ (ELECTRODE) IMPLANT
CHLORAPREP W/TINT 26 (MISCELLANEOUS) ×2 IMPLANT
CLIP APPLIE ROT 10 11.4 M/L (STAPLE) IMPLANT
DEVICE SUT QUICK LOAD TK 5 (SUTURE) ×2 IMPLANT
DEVICE SUT TI-KNOT TK 5X26 (SUTURE) ×2 IMPLANT
DEVICE SUTURE ENDOST 10MM (ENDOMECHANICALS) ×2 IMPLANT
DISSECTOR BLUNT TIP ENDO 5MM (MISCELLANEOUS) ×2 IMPLANT
DRAIN PENROSE 0.5X18 (DRAIN) IMPLANT
DRAPE UTILITY XL STRL (DRAPES) ×4 IMPLANT
DRSG TEGADERM 2-3/8X2-3/4 SM (GAUZE/BANDAGES/DRESSINGS) IMPLANT
ELECT REM PT RETURN 15FT ADLT (MISCELLANEOUS) ×2 IMPLANT
GAUZE 4X4 16PLY ~~LOC~~+RFID DBL (SPONGE) ×2 IMPLANT
GAUZE SPONGE 2X2 8PLY STRL LF (GAUZE/BANDAGES/DRESSINGS) IMPLANT
GLOVE BIO SURGEON STRL SZ7.5 (GLOVE) ×2 IMPLANT
GLOVE INDICATOR 8.0 STRL GRN (GLOVE) ×2 IMPLANT
GOWN STRL REUS W/ TWL XL LVL3 (GOWN DISPOSABLE) ×2 IMPLANT
GOWN STRL REUS W/TWL XL LVL3 (GOWN DISPOSABLE) ×2
GRASPER SUT TROCAR 14GX15 (MISCELLANEOUS) IMPLANT
IRRIG SUCT STRYKERFLOW 2 WTIP (MISCELLANEOUS) ×2
IRRIGATION SUCT STRKRFLW 2 WTP (MISCELLANEOUS) IMPLANT
KIT BASIN OR (CUSTOM PROCEDURE TRAY) ×2 IMPLANT
KIT ESOPHYX Z+ (Miscellaneous) IMPLANT
KIT TURNOVER KIT A (KITS) IMPLANT
NDL HYPO 22X1.5 SAFETY MO (MISCELLANEOUS) ×2 IMPLANT
NEEDLE HYPO 22X1.5 SAFETY MO (MISCELLANEOUS) ×2
NS IRRIG 1000ML POUR BTL (IV SOLUTION) ×2 IMPLANT
PACK UNIVERSAL I (CUSTOM PROCEDURE TRAY) IMPLANT
SCISSORS LAP 5X45 EPIX DISP (ENDOMECHANICALS) ×2 IMPLANT
SET TUBE SMOKE EVAC HIGH FLOW (TUBING) ×2 IMPLANT
SHEARS HARMONIC 45 ACE (MISCELLANEOUS) ×2 IMPLANT
SLEEVE ADV FIXATION 12X100MM (TROCAR) IMPLANT
SLEEVE ADV FIXATION 5X100MM (TROCAR) ×4 IMPLANT
SOLUTION ANTFG W/FOAM PAD STRL (MISCELLANEOUS) ×2 IMPLANT
SPIKE FLUID TRANSFER (MISCELLANEOUS) ×2 IMPLANT
STRIP CLOSURE SKIN 1/2X4 (GAUZE/BANDAGES/DRESSINGS) IMPLANT
SUT ETHIBOND 2 0 SH (SUTURE)
SUT ETHIBOND 2 0 SH 36X2 (SUTURE) IMPLANT
SUT MNCRL AB 4-0 PS2 18 (SUTURE) ×2 IMPLANT
SUT SURGIDAC NAB ES-9 0 48 120 (SUTURE) ×2 IMPLANT
SUT VICRYL 0 UR6 27IN ABS (SUTURE) ×2 IMPLANT
SYR 20ML ECCENTRIC (SYRINGE) ×2 IMPLANT
SYR 20ML LL LF (SYRINGE) ×2 IMPLANT
TIP INNERVISION DETACH 40FR (MISCELLANEOUS) IMPLANT
TIP INNERVISION DETACH 50FR (MISCELLANEOUS) IMPLANT
TIP INNERVISION DETACH 56FR (MISCELLANEOUS) IMPLANT
TOWEL OR 17X26 10 PK STRL BLUE (TOWEL DISPOSABLE) ×2 IMPLANT
TOWEL OR NON WOVEN STRL DISP B (DISPOSABLE) ×2 IMPLANT
TRAY FOLEY MTR SLVR 16FR STAT (SET/KITS/TRAYS/PACK) ×2 IMPLANT
TROCAR ADV FIXATION 12X100MM (TROCAR) ×2 IMPLANT
TROCAR ADV FIXATION 5X100MM (TROCAR) ×2 IMPLANT
TROCAR XCEL NON-BLD 5MMX100MML (ENDOMECHANICALS) ×2 IMPLANT
TUBING CONNECTING 10 (TUBING) IMPLANT

## 2023-09-20 NOTE — H&P (Signed)
History and Physical  [name] LOV:[id number] DOB: December 20, 1950 DOA: 09/20/2023  PCP: Lucky Cowboy, MD   Chief Complaint: Observation status post laparoscopic hiatal hernia repair and cTIF  HPI: [name] is a 72 y.o. female with medical history significant for pretension, hyperlipidemia, severe GERD, DCIS of the left breast status post successful treatment on surveillance being admitted for observation overnight to the hospitalist service after laparoscopic hiatal hernia repair and transoral incision was fundoplication today.  Patient was seen and examined in the PACU postoperatively, surgery proceeded without complication.  Pain is currently well-controlled, patient is quite sleepy but has no specific complaints.  On review of the medical record, she has a history of severe heartburn, taking 2 medications and required to sleep elevated.  Has a dry cough throughout the day, severe GERD was diagnosed when she first went to see ENT due to sinus drainage and postnasal drip.  Review of Systems: Please see HPI for pertinent positives and negatives. A complete 10 system review of systems could not be performed as the patient is currently somnolent.  Past Medical History:  Diagnosis Date   Allergy    Anxiety    Arthritis    Basal cell carcinoma 2011   BCC   Bell palsy 02/04/2016   Left   Breast cancer (HCC)    Carpal tunnel syndrome on both sides 01/23/2017   Colon polyps    Complication of anesthesia    GERD (gastroesophageal reflux disease)    Hyperlipidemia    Hypertension 2005   IBS (irritable bowel syndrome)    Plantar fasciitis    Pneumonia    PONV (postoperative nausea and vomiting)    Pre-diabetes    Prediabetes    Past Surgical History:  Procedure Laterality Date   ABDOMINAL HYSTERECTOMY  11/27/1988   basal cell carcinoma  11/27/2009   nose   BREAST LUMPECTOMY WITH RADIOACTIVE SEED LOCALIZATION Left 12/05/2019   Procedure: LEFT BREAST LUMPECTOMY WITH  RADIOACTIVE SEED LOCALIZATION;  Surgeon: Ovidio Kin, MD;  Location: [facility];  Service: General;  Laterality: Left;   MASTECTOMY, PARTIAL Left    TENDON REPAIR Bilateral    plantar fasciitis   TONSILLECTOMY      Social History:  reports that she has never smoked. She has never used smokeless tobacco. She reports current alcohol use. She reports that she does not use drugs.   Allergies  Allergen Reactions   Dexamethasone Palpitations and Other (See Comments)    Jittery     Family History  Problem Relation Age of Onset   Hypertension Mother    Heart attack Mother    Hyperlipidemia Mother    Heart disease Mother 27       fatal MI   Stroke Mother    Arthritis Mother        rheumatoid   Cancer Father        lung   Hypertension Brother    Cancer Brother        throat, leukemia   Parkinson's disease Brother      Prior to Admission medications   Medication Sig Start Date End Date Taking? Authorizing Provider  allopurinol (ZYLOPRIM) 300 MG tablet TAKE 1 TABLET BY MOUTH ONCE DAILY FOR  GOUT  PREVENTION 11/18/22  Yes Lucky Cowboy, MD  Ascorbic Acid (VITAMIN C) 1000 MG tablet Take 1,000 mg by mouth daily.   Yes [provider]  Cholecalciferol (VITAMIN D PO) Take 10,000 Units by mouth daily.   Yes [provider]  Cyanocobalamin (VITAMIN B-12 PO) Take 1 tablet by mouth daily.   Yes [provider]  famotidine (PEPCID) 40 MG tablet Take 40 mg by mouth at bedtime. 02/05/23 02/05/24 Yes [provider]  fenofibrate (TRICOR) 145 MG tablet Take  1 tablet  Daily for Triglycerides  (Blood Fats)                                                    /              TAKE                          BY                    MOUTH                   ONCE DAILY 07/06/22  Yes Lucky Cowboy, MD  Fexofenadine-Pseudoephedrine (ALLEGRA-D 24 HOUR PO) Take 1 tablet by mouth daily.   Yes [provider]  metFORMIN (GLUCOPHAGE-XR) 500 MG 24 hr tablet  Take 2 tablets 2 x /day with Meals for Diabetes. Patient taking differently: Take 500 mg by mouth daily. 03/22/23  Yes Lucky Cowboy, MD  omeprazole (PRILOSEC) 40 MG capsule Take 1 capsule (40 mg total) by mouth daily. 03/22/23  Yes Lucky Cowboy, MD  pravastatin (PRAVACHOL) 20 MG tablet Take 20 mg by mouth every Monday, Wednesday, and Friday.   Yes [provider]  pyridOXINE (VITAMIN B-6) 100 MG tablet Take 100 mg by mouth daily.   Yes [provider]  triamterene-hydrochlorothiazide (MAXZIDE-25) 37.5-25 MG tablet Take 1/2 to 1 tablet Daily for BP & Fluid Retention Patient taking differently: Take 0.5 tablets by mouth daily. 01/04/21  Yes Lucky Cowboy, MD  zinc gluconate 50 MG tablet Take 50 mg by mouth daily.   Yes [provider]  aspirin EC 81 MG tablet Take 1 tablet (81 mg total) by mouth daily. 02/05/20   Serena Croissant, MD  phentermine (ADIPEX-P) 37.5 MG tablet Take 1 tablet every Morning for Dieting & Weight Loss                                /                                                                   TAKE                                         BY                                                 MOUTH 09/19/23   Lucky Cowboy, MD  topiramate (TOPAMAX) 50 MG tablet Take 1 to 2 tablets  at Night for Dieting & Weight Loss                                   /                                                                   TAKE                                         BY                                                 MOUTH 09/19/23   Lucky Cowboy, MD    Physical Exam: BP (!) 143/71 (BP Location: Right Arm)   Pulse 65   Temp 98.7 F (37.1 C)   Resp 20   Wt 69.9 kg   SpO2 100%   BMI 23.42 kg/m   General:  Alert, oriented, calm, in no acute distress, patient sleepy in PACU but arousable. Eyes: EOMI, clear conjuctivae, white sclerea Neck: supple, no masses, trachea mildline  Cardiovascular: RRR, no murmurs or rubs, no peripheral edema   Respiratory: clear to auscultation bilaterally, no wheezes, no crackles  Abdomen: soft, appropriately tender, nondistended, normal bowel tones heard  Skin: dry, no rashes  Musculoskeletal: no joint effusions, normal range of motion  Psychiatric: appropriate affect, normal speech  Neurologic: extraocular muscles intact, clear speech, moving all extremities with intact sensorium         Labs on Admission:  Basic Metabolic Panel: No results for input(s): "NA", "K", "CL", "CO2", "GLUCOSE", "BUN", "CREATININE", "CALCIUM", "MG", "PHOS" in the last 168 hours. Liver Function Tests: No results for input(s): "AST", "ALT", "ALKPHOS", "BILITOT", "PROT", "ALBUMIN" in the last 168 hours. No results for input(s): "LIPASE", "AMYLASE" in the last 168 hours. No results for input(s): "AMMONIA" in the last 168 hours. CBC: No results for input(s): "WBC", "NEUTROABS", "HGB", "HCT", "MCV", "PLT" in the last 168 hours. Cardiac Enzymes: No results for input(s): "CKTOTAL", "CKMB", "CKMBINDEX", "TROPONINI" in the last 168 hours.  BNP (last 3 results) No results for input(s): "BNP" in the last 8760 hours.  ProBNP (last 3 results) No results for input(s): "PROBNP" in the last 8760 hours.  CBG: Recent Labs  Lab 09/20/23 1229  GLUCAP 100*    Radiological Exams on Admission: No results found.  Assessment/Plan [name] is a 72 y.o. female with medical history significant for hypertension, prediabetes, hyperlipidemia, severe GERD, DCIS of the left breast status post successful treatment on surveillance being admitted for observation overnight to the hospitalist service after laparoscopic hiatal hernia repair and transoral incisionless fundoplication today.   Status post laparoscopic hiatal hernia repair and transoral incisionless fundoplication  -Overnight observation -Pain control and nausea control as needed -Diet per gastroenterology  Prediabetic-last hemoglobin A1c 6.0, blood sugar currently  well-controlled -Moderate dose sliding scale insulin  GERD-Per GI  Hypertension-resume home Maxide in the morning, IV hydralazine as needed in the  meantime  DVT prophylaxis: SCDs only     Code Status: Full Code  Consults called: Patient will be followed in-house by gastroenterology service  Admission status: Observation  Time spent: 46 minutes  Mariangel Ringley Sharlette Dense MD [facility] Pager [phone]  If 7PM-7AM, please contact night-coverage [email or web address] Password [email or web address]  09/20/2023, 3:50 PM

## 2023-09-20 NOTE — Interval H&P Note (Signed)
History and Physical Interval Note:  09/20/2023 12:32 PM  [name]  has presented today for surgery, with the diagnosis of GERD AND SLIDING HIATAL HERNIA.  The various methods of treatment have been discussed with the patient and family. After consideration of risks, benefits and other options for treatment, the patient has consented to  Procedure(s): LAPAROSCOPIC REPAIR OF SLIDING HIATAL HERNIA (N/A) ESOPHAGOGASTRODUODENOSCOPY (EGD) (N/A) TRANSORAL INCISIONLESS FUNDOPLICATION (N/A) as a surgical intervention.  The patient's history has been reviewed, patient examined, no change in status, stable for surgery.  I have reviewed the patient's chart and labs.  Questions were answered to the patient's satisfaction.     Verlin Dike [employer]

## 2023-09-20 NOTE — Anesthesia Postprocedure Evaluation (Signed)
Anesthesia Post Note  Patient: [name]  Procedure(s) Performed: LAPAROSCOPIC REPAIR OF SLIDING HIATAL HERNIA ESOPHAGOGASTRODUODENOSCOPY (EGD) TRANSORAL INCISIONLESS FUNDOPLICATION SAVORY DILATION     Patient location during evaluation: PACU Anesthesia Type: General Level of consciousness: awake and alert Pain management: pain level controlled Vital Signs Assessment: post-procedure vital signs reviewed and stable Respiratory status: spontaneous breathing, nonlabored ventilation and respiratory function stable Cardiovascular status: blood pressure returned to baseline Postop Assessment: no apparent nausea or vomiting Anesthetic complications: no   No notable events documented.  Last Vitals:  Vitals:   09/20/23 1630 09/20/23 1645  BP: 138/67 (!) 144/70  Pulse: 66 64  Resp: 20 20  Temp:    SpO2: 92% 94%    Last Pain:  Vitals:   09/20/23 1630  TempSrc:   PainSc: Asleep                 Shanda Howells

## 2023-09-20 NOTE — Anesthesia Preprocedure Evaluation (Addendum)
Anesthesia Evaluation  Patient identified by MRN, date of birth, ID band Patient awake    Reviewed: Allergy & Precautions, NPO status , Patient's Chart, lab work & pertinent test results  History of Anesthesia Complications (+) PONV and history of anesthetic complications  Airway Mallampati: II  TM Distance: >3 FB Neck ROM: Full    Dental no notable dental hx.    Pulmonary neg pulmonary ROS   Pulmonary exam normal        Cardiovascular hypertension, Normal cardiovascular exam     Neuro/Psych negative neurological ROS     GI/Hepatic Neg liver ROS, hiatal hernia,GERD  Medicated,,  Endo/Other    Renal/GU negative Renal ROS     Musculoskeletal  (+) Arthritis ,    Abdominal   Peds  Hematology negative hematology ROS (+)   Anesthesia Other Findings Day of surgery medications reviewed with patient.  Reproductive/Obstetrics                              Anesthesia Physical Anesthesia Plan  ASA: 2  Anesthesia Plan: General   Post-op Pain Management: Ofirmev IV (intra-op)*   Induction: Intravenous  PONV Risk Score and Plan: 4 or greater and Treatment may vary due to age or medical condition, Ondansetron, Propofol infusion and TIVA  Airway Management Planned: Oral ETT  Additional Equipment: None  Intra-op Plan:   Post-operative Plan: Extubation in OR  Informed Consent: I have reviewed the patients History and Physical, chart, labs and discussed the procedure including the risks, benefits and alternatives for the proposed anesthesia with the patient or authorized representative who has indicated his/her understanding and acceptance.     Dental advisory given  Plan Discussed with: CRNA  Anesthesia Plan Comments:         Anesthesia Quick Evaluation

## 2023-09-20 NOTE — Consult Note (Signed)
CC: here for surgery  Requesting provider: dr Barron Alvine  HPI: [name] is an 72 y.o. female who is here for laparoscopic sliding hiatal hernia repair and concomitant TIF.  She denies changes since seen in clinic.   Old hpi: She is here to discuss the combined procedure of a transoral incisionless fundoplication with hiatal hernia repair  She states that she takes 2 heartburn medications. She has a sleep elevated. She has poor sleep. She feels that she can feel something coming up her chest and throat. She does not regurgitate food per se. She feels a fullness in her upper chest. She will generally have some nausea at times generally it can occur after eating. She also has a dry cough throughout the day and night more so at night. She states that she does not initially have heartburn per se. She really cannot tell a difference when she takes her heartburn medication or not. She saw ENT because she has a lot of sinus drainage and postnasal drip. She states that the ENT doctor told her that he thought her issues were due to reflux. She has an occasional stuck sensation with solids especially if she overeats. For example if she tried to eat an entire sandwich in 1 sitting she would have a sensation of it getting stuck. She occasionally feels full on small amounts. No tobacco use. No epigastric pain. No diarrhea or constipation. Denies prior abdominal surgery.  GERD evaluation: - Last EGD: 08/2021 & 07/2023 - Barium esophagram: 08/22/2021: Severe gastroesophageal reflux extending to the cervical esophagus. Otherwise normal - Esophageal Manometry: None - pH/Impedance: None - Bravo: None - GES: 04/06/2023: Normal  Endoscopic History: - 09/21/2021: EGD: Small hiatal hernia, normal stomach and duodenum    Past Medical History:  Diagnosis Date   Allergy    Anxiety    Arthritis    Basal cell carcinoma 2011   BCC   Bell palsy 02/04/2016   Left   Breast cancer (HCC)    Carpal tunnel  syndrome on both sides 01/23/2017   Colon polyps    Complication of anesthesia    GERD (gastroesophageal reflux disease)    Hyperlipidemia    Hypertension 2005   IBS (irritable bowel syndrome)    Plantar fasciitis    Pneumonia    PONV (postoperative nausea and vomiting)    Pre-diabetes    Prediabetes     Past Surgical History:  Procedure Laterality Date   ABDOMINAL HYSTERECTOMY  11/27/1988   basal cell carcinoma  11/27/2009   nose   BREAST LUMPECTOMY WITH RADIOACTIVE SEED LOCALIZATION Left 12/05/2019   Procedure: LEFT BREAST LUMPECTOMY WITH RADIOACTIVE SEED LOCALIZATION;  Surgeon: Ovidio Kin, MD;  Location: [facility];  Service: General;  Laterality: Left;   MASTECTOMY, PARTIAL Left    TENDON REPAIR Bilateral    plantar fasciitis   TONSILLECTOMY      Family History  Problem Relation Age of Onset   Hypertension Mother    Heart attack Mother    Hyperlipidemia Mother    Heart disease Mother 35       fatal MI   Stroke Mother    Arthritis Mother        rheumatoid   Cancer Father        lung   Hypertension Brother    Cancer Brother        throat, leukemia   Parkinson's disease Brother     Social:  reports that she has never smoked. She has never  used smokeless tobacco. She reports current alcohol use. She reports that she does not use drugs.  Allergies:  Allergies  Allergen Reactions   Dexamethasone Palpitations and Other (See Comments)    Jittery     Medications: I have reviewed the patient's current medications.   ROS - all of the below systems have been reviewed with the patient and positives are indicated with bold text General: chills, fever or night sweats Eyes: blurry vision or double vision ENT: epistaxis or sore throat Allergy/Immunology: itchy/watery eyes or nasal congestion Hematologic/Lymphatic: bleeding problems, blood clots or swollen lymph nodes Endocrine: temperature intolerance or unexpected weight changes Breast: new or  changing breast lumps or nipple discharge Resp: cough, shortness of breath, or wheezing CV: chest pain or dyspnea on exertion GI: as per HPI GU: dysuria, trouble voiding, or hematuria MSK: joint pain or joint stiffness Neuro: TIA or stroke symptoms Derm: pruritus and skin lesion changes Psych: anxiety and depression  PE Blood pressure (!) 154/77, pulse 75, temperature 98.1 F (36.7 C), temperature source Oral, resp. rate 18, weight 69.9 kg, SpO2 97%. Constitutional: NAD; conversant; no deformities Eyes: Moist conjunctiva; no lid lag; anicteric; PERRL Neck: Trachea midline; no thyromegaly Lungs: Normal respiratory effort; no tactile fremitus CV: RRR; no palpable thrills; no pitting edema GI: Abd soft, nt; no palpable hepatosplenomegaly MSK: Normal gait; no clubbing/cyanosis Psychiatric: Appropriate affect; alert and oriented x3 Lymphatic: No palpable cervical or axillary lymphadenopathy Skin:no rash/lesions  Results for orders placed or performed during the hospital encounter of 09/20/23 (from the past 48 hour(s))  Glucose, capillary     Status: Abnormal   Collection Time: 09/20/23 12:29 PM  Result Value Ref Range   Glucose-Capillary 100 (H) 70 - 99 mg/dL    Comment: Glucose reference range applies only to samples taken after fasting for at least 8 hours.    No results found.  Imaging: reviewed  A/P: [name] is an 72 y.o. female with  Gastroesophageal reflux disease with hiatal hernia   To OR for lap sliding hiatal hernia repair with cTIF All questions asked and answered IV abx ERAS  Mary Sella. Andrey Campanile, MD, FACS General, Bariatric, & Minimally Invasive Surgery [facility]

## 2023-09-20 NOTE — Op Note (Signed)
PATIENT: [name]   PRE-OPERATIVE DIAGNOSIS:  GERD/ HIATAL HERNIA   POST-OPERATIVE DIAGNOSIS:  GERD/ SLIDING HIATAL HERNIA   PROCEDURE:  Procedure(s): LAPAROSCOPIC REPAIR OF HIATAL HERNIA - Starlette Thurow ESOPHAGOGASTRODUODENOSCOPY (EGD) TRANSORAL INCISIONLESS FUNDOPLICATION - Cirigliano LAPAROSCOPIC BILATERAL TAP BLOCK     SURGEON:  Surgeon(s): Gaynelle Adu, MD  Doristine Locks MD   ASSISTANTS: Karie Soda MD FACS   ANESTHESIA:   general   DRAINS: none    LOCAL MEDICATIONS USED:  MARCAINE    and OTHER exparel   SPECIMEN:  No Specimen   DISPOSITION OF SPECIMEN:  N/A   COUNTS:  YES   INDICATION FOR PROCEDURE: Patient has a longstanding history of GERD and an endoscopic evidence of a probable sliding hiatal hernia.  The patient was evaluated by gastroenterology for concomitant TIF procedure with hiatal hernia repair.  Please see outside records for additional information   PROCEDURE: Patient received oral Tylenol and gabapentin preoperatively.  The patient also received additional antiemetic medications preoperatively.  The patient received 5000 units of subcutaneous heparin.  The patient was taken to the OR 1 at [facility] and placed upon on the operating room table.  General endotracheal anesthesia was established.  Sequential compression devices were placed.  The patient's arms were tucked at the side with the appropriate padding.  The patient received IV antibiotic prior to skin incision.  Surgical timeout was performed.   Access to the abdomen was obtained in the left upper quadrant at Palmer's point using Optiview technique.  A small incision was made then using a 0 degree 5 mm laparoscope through a 5 mm trocar I advanced it through all layers of the abdominal wall and carefully entered the abdominal cavity.  Pneumoperitoneum was smoothly established up to a patient pressure of 15 mmHg without any change in patient vital signs.  There is no evidence of injury to  surrounding viscera.  The patient was placed in steep reverse Trendelenburg.  A 5 mm trocar was placed slightly above into the left of the umbilicus.  A 12 mm trocar was placed in the right midabdomen followed by a 5 mm trocar in the lateral right abdomen.  A 5 mm trocar was placed in the subxiphoid position.  I bilateral Exparel/MARCAINE tap block was performed for postoperative pain relief.  The 5 mm subxiphoid trocar was removed and a Nathanson liver retractor was used to lift up the left lobe of the liver. The patient had a fatty liver.  The stomach and diaphragm were inspected.  There was a small hiatal hernia.    The gastrohepatic ligament was incised with harmonic scalpel.  My assistant grasped the lesser curvature adipose tissue and reflected it to patient's left.  Identified the right crus of the diaphragm.  I gently incised it with harmonic scalpel.  Then using blunt dissection with a  Prestige grasper I was able to identify the left crus of the diaphragm.  We are able to achieve a circumferential mobilization of the distal esophagus at the GE junction.  I took down some anterior attachments as well.  The fundus attachment to the left crus was also taken down with a Harmonic scalpel.  I did not have to mobilize any of the short gastric vessels.  The patient had intra-abdominal esophageal length.  The aorta was visualized.  There is no significant distal esophageal lipoma.  The posterior vagus nerve was identified.  The gap between the left and the right crus was not that large.  I  did a circumferential mobilization of the lower esophagus in the mediastinum both anterior and posterior left and right sided.  Did not visualize the anterior vagus.  It was probably about a 2 cm transverse hernia.  I closed the left and right crus with 2 interrupted 0 Ethibond sutures.  There was a little bit of bleeding from the right crus with the first suture.  However it stopped.  I lowered the pneumoperitoneum to 8 mmHg and  observe the area for a few minutes and there is no evidence of additional bleeding.  This provided a snug fit around the esophagus that left some room to pass a large bougie.  Upper abdominal cavity was reinspected.  No evidence of bleeding.  The [facility] liver retractor was removed without injury to the liver.  The 12 mm trocar was removed and a 0 Vicryl in the PMI suture passer was used to close the fascial defect at that location.  Additional Exparel Marcaine mixture was infiltrated in this location.  Pneumoperitoneum was released.  Remaining trochars were removed.  Skin incisions were closed by me in a subcuticular fashion and followed by Dermabond.  All needle, instrument, and sponge counts were correct x2.  The patient remained intubated.   After laparoscopic hiatal hernia repair was complete, Dr. Doristine Locks, a gastroenterologist with endoscopic procedure expertise and trained in endoscopic fundoplication, proceeded to perform endoscopic fundoplication and he will dictate that separately.    I was present during the initial portion of when the endoscope was advanced by Dr. Barron Alvine down the esophagus into the stomach.  There is no evidence of injury to the esophagus.  There is no evidence of mucosal disruption.    PLAN OF CARE: Admit for overnight observation   PATIENT DISPOSITION:  PACU - hemodynamically stable.   Delay start of Pharmacological VTE agent (>24hrs) due to surgical blood loss or risk of bleeding:  no   Mary Sella. Andrey Campanile, MD, FACS General, Bariatric, & Minimally Invasive Surgery [facility], [place]

## 2023-09-20 NOTE — H&P (Signed)
Chief Complaint: GERD, hiatal hernia    HPI:     [name] is a 72 y.o. female presenting for concomitant laparoscopic hiatal hernia repair and Transoral Incisionless Fundoplication (cTIF) for refractory reflux symptoms despite appropriate trial of acid suppression therapy.  Reflux symptoms started about 3 years ago.  Started with increased sinus drainage, then developed regurgitation, increased throat clearing, dry cough.  Symptoms occurs throughout the day and nocturnal breakthrough as well.  Has been treated with high-dose PPI (briefly went up to omeprazole 80 mg bid) and added Pepcid 20 mg at nighttime without much relief.  Sleeps with HOB elevated, avoid eating within 3+ hours of bedtime.  Was evaluated by Dr. Pollyann Kennedy in the ENT Clinic.  CT sinuses were normal, exam unremarkable to include normal indirect laryngoscopy, and felt sxs were predominantly reflux related.      GERD history: -Index symptoms: Nocturnal regurgitation, sinus drainage, dry cough, increased throat clearing, waterbrash. No dysphagia.  -Exacerbating features: Supine/night time -Medications trialed: Omeprazole, Pepcid, Carafate -Current medications: Omeprazole 40 mg daily, Pepcid 20 mg daily -Complications: Hiatal hernia   GERD evaluation: - Last EGD: 07/2023 - Barium esophagram: 08/22/2021: Severe gastroesophageal reflux extending to the cervical esophagus.  Otherwise normal - Esophageal Manometry: None - pH/Impedance: None - Bravo: None - GES: 04/06/2023: Normal   Endoscopic History: - 09/21/2021: EGD: Small hiatal hernia, normal stomach and duodenum - 08/06/2023: EGD: Sliding 1-2 cm hiatal hernia with Hill grade 3 valve.  Otherwise normal   - 12/19/2021: Colonoscopy: Hemorrhoids, diverticulosis, small adenoma in the sigmoid colon.  Repeat in 5 years.   Past Medical History:  Diagnosis Date   Allergy    Anxiety    Arthritis    Basal cell carcinoma 2011   BCC   Bell palsy 02/04/2016   Left    Breast cancer (HCC)    Carpal tunnel syndrome on both sides 01/23/2017   Colon polyps    Complication of anesthesia    GERD (gastroesophageal reflux disease)    Hyperlipidemia    Hypertension 2005   IBS (irritable bowel syndrome)    Plantar fasciitis    Pneumonia    PONV (postoperative nausea and vomiting)    Pre-diabetes    Prediabetes      Past Surgical History:  Procedure Laterality Date   ABDOMINAL HYSTERECTOMY  11/27/1988   basal cell carcinoma  11/27/2009   nose   BREAST LUMPECTOMY WITH RADIOACTIVE SEED LOCALIZATION Left 12/05/2019   Procedure: LEFT BREAST LUMPECTOMY WITH RADIOACTIVE SEED LOCALIZATION;  Surgeon: Ovidio Kin, MD;  Location: [facility];  Service: General;  Laterality: Left;   MASTECTOMY, PARTIAL Left    TENDON REPAIR Bilateral    plantar fasciitis   TONSILLECTOMY     Family History  Problem Relation Age of Onset   Hypertension Mother    Heart attack Mother    Hyperlipidemia Mother    Heart disease Mother 67       fatal MI   Stroke Mother    Arthritis Mother        rheumatoid   Cancer Father        lung   Hypertension Brother    Cancer Brother        throat, leukemia   Parkinson's disease Brother    Social History   Tobacco Use   Smoking status: Never   Smokeless tobacco: Never  Vaping Use   Vaping status: Never Used  Substance Use Topics   Alcohol use: Yes  Comment: rare   Drug use: Never   Current Facility-Administered Medications  Medication Dose Route Frequency Provider Last Rate Last Admin   0.9 %  sodium chloride infusion   Intravenous Continuous Ernesteen Mihalic V, DO       ceFAZolin (ANCEF) IVPB 2g/100 mL premix  2 g Intravenous Once Nida Manfredi V, DO       Chlorhexidine Gluconate Cloth 2 % PADS 6 each  6 each Topical Once Gaynelle Adu, MD       dexamethasone (DECADRON) injection 4 mg  4 mg Intravenous On Call to OR Gaynelle Adu, MD       famotidine (PEPCID) IVPB 20 mg premix  20 mg Intravenous Once  Drezden Seitzinger V, DO       lactated ringers infusion   Intravenous Continuous Lowella Curb, MD 10 mL/hr at 09/20/23 1200 New Bag at 09/20/23 1200   ondansetron (ZOFRAN) injection 4 mg  4 mg Intravenous Once Lennix Rotundo V, DO       scopolamine (TRANSDERM-SCOP) 1 MG/3DAYS 1.5 mg  1 patch Transdermal Once Alana Dayton V, DO   1.5 mg at 09/20/23 1203   Allergies  Allergen Reactions   Dexamethasone Palpitations and Other (See Comments)    Jittery      Review of Systems: All systems reviewed and negative except where noted in HPI.     Physical Exam:    Wt Readings from Last 3 Encounters:  09/20/23 69.9 kg  09/04/23 70.8 kg  08/06/23 68.9 kg    BP (!) 154/77   Pulse 75   Temp 98.1 F (36.7 C) (Oral)   Resp 18   Wt 69.9 kg   SpO2 97%   BMI 23.42 kg/m  Constitutional:  Pleasant, in no acute distress. Psychiatric: Normal mood and affect. Behavior is normal. Cardiovascular: Normal rate, regular rhythm. No edema Pulmonary/chest: Effort normal and breath sounds normal. No wheezing, rales or rhonchi. Abdominal: Soft, nondistended, nontender. Bowel sounds active throughout. There are no masses palpable. No hepatomegaly. Neurological: Alert and oriented to person place and time. Skin: Skin is warm and dry. No rashes noted.   ASSESSMENT AND PLAN;   1) GERD 2) Hiatal hernia - Plan for concomitant laparoscopic hiatal hernia repair and Transoral Incisionless Fundoplication (cTIF) in the main OR today - Plan for admission from PACU for overnight observation - Additional recommendations pending completion of surgery today.   Verlin Dike Ryann Pauli, DO, FACG  09/20/2023, 12:28 PM   No ref. provider found

## 2023-09-20 NOTE — Anesthesia Procedure Notes (Signed)
Procedure Name: Intubation Date/Time: 09/20/2023 1:48 PM  Performed by: Nathen May, CRNAPre-anesthesia Checklist: Patient identified, Emergency Drugs available, Suction available and Patient being monitored Patient Re-evaluated:Patient Re-evaluated prior to induction Oxygen Delivery Method: Circle System Utilized Preoxygenation: Pre-oxygenation with 100% oxygen Induction Type: IV induction Ventilation: Mask ventilation without difficulty Laryngoscope Size: Mac and 3 Tube type: Oral Tube size: 7.0 mm Number of attempts: 1 Airway Equipment and Method: Stylet and Oral airway Placement Confirmation: ETT inserted through vocal cords under direct vision, positive ETCO2 and breath sounds checked- equal and bilateral Secured at: 22 cm Tube secured with: Tape Dental Injury: Teeth and Oropharynx as per pre-operative assessment

## 2023-09-20 NOTE — Transfer of Care (Signed)
Immediate Anesthesia Transfer of Care Note  Patient: [name]  Procedure(s) Performed: LAPAROSCOPIC REPAIR OF SLIDING HIATAL HERNIA ESOPHAGOGASTRODUODENOSCOPY (EGD) TRANSORAL INCISIONLESS FUNDOPLICATION SAVORY DILATION  Patient Location: PACU  Anesthesia Type:General  Level of Consciousness: drowsy  Airway & Oxygen Therapy: Patient Spontanous Breathing and Patient connected to face mask oxygen  Post-op Assessment: Report given to RN and Post -op Vital signs reviewed and stable  Post vital signs: Reviewed and stable  Last Vitals:  Vitals Value Taken Time  BP 143/71 09/20/23 1541  Temp 37.1 C 09/20/23 1540  Pulse 64 09/20/23 1542  Resp 20 09/20/23 1542  SpO2 100 % 09/20/23 1542  Vitals shown include unfiled device data.  Last Pain:  Vitals:   09/20/23 1200  TempSrc:   PainSc: 0-No pain      Patients Stated Pain Goal: 4 (09/20/23 1200)  Complications: No notable events documented.

## 2023-09-21 ENCOUNTER — Other Ambulatory Visit (HOSPITAL_COMMUNITY): Payer: Self-pay

## 2023-09-21 ENCOUNTER — Other Ambulatory Visit: Payer: Self-pay

## 2023-09-21 ENCOUNTER — Encounter (HOSPITAL_COMMUNITY): Payer: Self-pay | Admitting: General Surgery

## 2023-09-21 DIAGNOSIS — K219 Gastro-esophageal reflux disease without esophagitis: Secondary | ICD-10-CM

## 2023-09-21 DIAGNOSIS — K449 Diaphragmatic hernia without obstruction or gangrene: Principal | ICD-10-CM

## 2023-09-21 LAB — GLUCOSE, CAPILLARY
Glucose-Capillary: 148 mg/dL — ABNORMAL HIGH (ref 70–99)
Glucose-Capillary: 129 mg/dL — ABNORMAL HIGH (ref 70–99)
Glucose-Capillary: 114 mg/dL — ABNORMAL HIGH (ref 70–99)
Glucose-Capillary: 143 mg/dL — ABNORMAL HIGH (ref 70–99)
Glucose-Capillary: 97 mg/dL (ref 70–99)

## 2023-09-21 MED ORDER — KETOROLAC TROMETHAMINE 15 MG/ML IJ SOLN
15.0000 mg | Freq: Three times a day (TID) | INTRAMUSCULAR | Status: DC
  Administered 2023-09-21 (×2): 15 mg via INTRAVENOUS
  Filled 2023-09-21 (×2): qty 1

## 2023-09-21 MED ORDER — PANTOPRAZOLE SODIUM 20 MG PO TBEC
DELAYED_RELEASE_TABLET | ORAL | 1 refills | Status: DC
  Filled 2023-09-21: qty 70, 35d supply, fill #0

## 2023-09-21 MED ORDER — ACETAMINOPHEN 500 MG PO TABS
1000.0000 mg | ORAL_TABLET | Freq: Four times a day (QID) | ORAL | Status: DC
  Administered 2023-09-21: 1000 mg via ORAL
  Filled 2023-09-21: qty 2

## 2023-09-21 MED ORDER — ONDANSETRON 4 MG PO TBDP
4.0000 mg | ORAL_TABLET | Freq: Four times a day (QID) | ORAL | 1 refills | Status: DC | PRN
  Filled 2023-09-21: qty 30, 8d supply, fill #0

## 2023-09-21 MED ORDER — LIDOCAINE 5 % EX PTCH
1.0000 | MEDICATED_PATCH | CUTANEOUS | Status: DC
  Administered 2023-09-21: 1 via TRANSDERMAL
  Filled 2023-09-21: qty 1

## 2023-09-21 MED ORDER — SIMETHICONE 80 MG PO CHEW
80.0000 mg | CHEWABLE_TABLET | Freq: Four times a day (QID) | ORAL | 1 refills | Status: DC | PRN
  Filled 2023-09-21: qty 30, 8d supply, fill #0

## 2023-09-21 MED ORDER — OXYCODONE HCL 5 MG PO TABS
5.0000 mg | ORAL_TABLET | ORAL | 0 refills | Status: DC | PRN
  Filled 2023-09-21: qty 30, 5d supply, fill #0

## 2023-09-21 MED ORDER — METOCLOPRAMIDE HCL 10 MG PO TABS
10.0000 mg | ORAL_TABLET | Freq: Four times a day (QID) | ORAL | 1 refills | Status: DC | PRN
  Filled 2023-09-21: qty 30, 8d supply, fill #0

## 2023-09-21 NOTE — Discharge Instructions (Addendum)
Please follow discharge & diet instructions from Dr [facility].  Please call their office with questions regarding diet, medications, trouble with oral intake  [phone]  [facility],  LAPAROSCOPIC SURGERY: POST OP INSTRUCTIONS Always review your discharge instruction sheet given to you by the facility where your surgery was performed.   PAIN CONTROL  First take acetaminophen (Tylenol) to control your pain after surgery.  Follow directions on package.  Taking acetaminophen (Tylenol)  regularly after surgery will help to control your pain and lower the amount of prescription pain medication you may need.  You should not take more than 3,000 mg (3 grams) of acetaminophen (Tylenol) in 24 hours.  You should not take ibuprofen (Advil), aleve, motrin, naprosyn or other NSAIDS if you have a history of stomach ulcers or chronic kidney disease.  A prescription for pain medication may be given to you upon discharge.  Take your pain medication as prescribed, if you still have uncontrolled pain after taking acetaminophen (Tylenol) or ibuprofen (Advil). Use ice packs to help control pain. If you need a refill on your pain medication, please contact your pharmacy.  They will contact our office to request authorization. Prescriptions will not be filled after 5pm or on week-ends.  HOME MEDICATIONS Take your usually prescribed medications unless otherwise directed.  DIET Follow diet instructions given to you by GI medicine/Dr Cirigliano   CONSTIPATION It is common to experience some constipation after surgery and if you are taking pain medication.  Increasing fluid intake and taking a stool softener (such as Colace) will usually help or prevent this problem from occurring.  A mild laxative (Milk of Magnesia or Miralax) should be taken according to package instructions if there are no bowel movements after 48 hours.  WOUND/INCISION CARE Most patients will experience some  swelling and bruising in the area of the incisions.  Ice packs will help.  Swelling and bruising can take several days to resolve.  Unless discharge instructions indicate otherwise, follow guidelines below  STERI-STRIPS - you may remove your outer bandages 48 hours after surgery, and you may shower at that time.  You have steri-strips (small skin tapes) in place directly over the incision.  These strips should be left on the skin for 7-10 days.   DERMABOND/SKIN GLUE - you may shower in 24 hours.  The glue will flake off over the next 2-3 weeks. Any sutures or staples will be removed at the office during your follow-up visit.  ACTIVITIES You may resume regular (light) daily activities beginning the next day--such as daily self-care, walking, climbing stairs--gradually increasing activities as tolerated.  You may have sexual intercourse when it is comfortable.  Refrain from any heavy lifting or straining until approved by your doctor. You may drive when you are no longer taking prescription pain medication, you can comfortably wear a seatbelt, and you can safely maneuver your car and apply brakes.  FOLLOW-UP You should see your doctor in the office for a follow-up appointment approximately 2-3 weeks after your surgery.  You should have been given your post-op/follow-up appointment when your surgery was scheduled.  If you did not receive a post-op/follow-up appointment, make sure that you call for this appointment within a day or two after you arrive home to insure a convenient appointment time.  OTHER INSTRUCTIONS   WHEN TO CALL YOUR DOCTOR (You can also call [facility] if you are having trouble reaching [facility]): Fever over 101.0 Inability to urinate Continued bleeding from incision. Increased pain,  redness, or drainage from the incision. Increasing abdominal pain Trouble with oral intake  The clinic staff is available to answer your questions during regular business hours.  Please don't hesitate  to call and ask to speak to one of the nurses for clinical concerns.  If you have a medical emergency, go to the nearest emergency room or call 911.  A surgeon from [facility] is always on call at the hospital. [address], [address], [place], [place]  [address] ? [address] [address], [place], [place]   [address] [phone] ? [phone] ? FAX [phone] Web site: [email or web address]  [facility]  Post-Op TIF Diet and General Guidelines:  Following a special diet after surgery is necessary for healing. You may encounter "good and bad days." Often foods that go down easily one day may give you problems the next. Realize this is part of the healing process. Your diet will progress slowly from liquids to soft foods. Eating six to eight small, frequent meals per day is recommended to ensure adequate nutrition intake and prevent overeating at one meal (distention). Crush all home medications until your diet has returned to 100% regular consistency.  Tips for Tolerating Your Diet: Warm fluids prior to eating may help to lubricate/relax your throat. Introduce foods one at a time to determine tolerance. Assess if milky foods coat your throat too much. Toasted and overcooked grains may be better tolerated as they will disintegrate instead of swell. Make meats tender by slow cooking, chewing, adding tenderizer spice or over the counter papain (papaya), bromelain (pineapple) or ficain (figs) meat protein enzymes. Sit upright while eating and drinking. Remain upright 20 minutes after eating and drinking. Sip fluids slowly, avoid straws and carbonation and do not chew gum to prevent air/gas intake. When diet has progressed to solids chew well (20-30 chews per bite) and eat slowly (30 minutes per meal). Stay hydrated; Dark urine, chapped lips and white tongue are symptoms of dehydration.  Dietary Advancement:                                     Stage 1 Clear  Liquid Diet This diet begins while you are in the hospital. Room temperature and warm liquids may be best tolerated Food Allowed: Water Fruit Juices Broth Jello Decaf tea/coffee (without milk or creamer) [place] ice/popsicles  Stage 2 Full Liquids After you are discharged home and tolerating clear liquids, you may advance to thick liquids. You will remain on this diet for 2 weeks. Food Allowed: Anything from stage 1 diet Milk Thinned hot cereals (cream of wheat) Strained/blended cream soups Ice cream/sherbet/custard/pudding Yogurt (without fruit seeds) Milkshakes/frappe Fruit and vegetable juices Protein drink/supplement: Boost, Ensure, [employer], Special K, Slimfast (These can help add more protein/calories to prevent fatigue, weight loss and muscle loss with restricted diet.  Stage 3 Soft/Blended Diet After 2 weeks of Stage 2 diet, start adding soft/blended foods into your diet. Chew and blend foods well. Add one new thing at a time to assess tolerance. You will remain on this diet for 2 weeks. Foods to add into diet: Anything from Stage 1 and Stage 2 diet Moist fish Blended chicken or ham Tofu Eggs Meats mixed with mayonnaise, mustard or salad dressings Dairy including cottage cheese and soft cheese Legumes including beans, lentils, hummus, bean soup, peas-mashed easily with a fork Pureed squash, potatoes, carrots, cauliflower, green beans Applesauce and  mashed bananas Crispy foods including seedless crackers, toasted breads/bread products, overcooked pasta/rice  Stage 4 Soft Diet After 2 weeks of Stage 3 diet advance to the final stage by adding foods that are soft in consistency and easily digested. You will remain on this diet for 2 weeks. Foods to add into diet: Anything from Stage 1, Stage 2, and Stage 3 diet Thinly shaved deli meats Well chewed meat and moist poultry Shrimp, crab, imitation products Soft Cooked vegetables Seedless, skinless  /peeled fruits   Avoid dry, baked chicken, tough steak, nuts, seeds, popcorn, raw fruits and raw vegetables until 8 weeks post op from day of surgery.   POST PROCEDURE RESTRICTIONS:  WEEK 1=NO LIFTING MORE THAN 5 POUNDS.  WEEKS 2 THROUGH 4=NO LIFTING MORE THAN 25 POUNDS.

## 2023-09-21 NOTE — Hospital Course (Signed)
[name] is a 72 y.o. female with medical history significant for HTN, HLD, severe GERD, DCIS of the left breast status post successful treatment on surveillance being admitted for observation overnight after laparoscopic hiatal hernia repair and transoral incisionless fundoplication on 10/24.  The morning following surgery she was having some complaints of left-sided neck pain.  There were no worrisome signs on exam notably no crepitus, stridor, abnormal breath sounds.  She was treated supportively with lidocaine patch and pain medications.  Pain slowly improved and she was considered stable for discharging home. She has adequate discharge instructions from surgery and outpatient follow-up scheduled.  Prescriptions were sent per specialist recommendations prior to discharge as well.

## 2023-09-21 NOTE — Progress Notes (Addendum)
[facility] ROUNDING NOTE   Subjective: TIF completed yesterday without complications.  Patient did well overnight without any acute events.  Tolerating liquids and p.o. medications without issue, minimal nausea. She is complaining of left upper neck/shoulder pain, worse with movement. No numbness tingling, CP, SOB.    With patient's neck pain she is concerned about going home.  Objective: Vital signs in last 24 hours: Temp:  [97.6 F (36.4 C)-98.7 F (37.1 C)] 98 F (36.7 C) (10/25 0506) Pulse Rate:  [55-76] 70 (10/25 0506) Resp:  [15-27] 18 (10/25 0506) BP: (134-165)/(62-88) 134/62 (10/25 0506) SpO2:  [92 %-100 %] 94 % (10/25 0506) Weight:  [69.9 kg] 69.9 kg (10/24 1141)   General: NAD Lungs: CTA b/l, no w/r/r Heart: RRR, no m/r/g Abdomen:  Minimal post operative TTP in epigastrium, without rebound or guarding. No peritoneal signs. Otherwise, soft, ND, +BS Ext: No c/c/e Musculoskeletal: TTP left sternocleidomastoid, normal lower extremity movement, sensation.    Intake/Output from previous day: 10/24 0701 - 10/25 0700 In: 1443.2 [P.O.:300; I.V.:1143.2] Out: 300 [Urine:300] Intake/Output this shift: No intake/output data recorded.   Lab Results: No results for input(s): "WBC", "HGB", "PLT", "MCV" in the last 72 hours. BMET No results for input(s): "NA", "K", "CL", "CO2", "GLUCOSE", "BUN", "CREATININE", "CALCIUM" in the last 72 hours. LFT No results for input(s): "PROT", "ALBUMIN", "AST", "ALT", "ALKPHOS", "BILITOT", "BILIDIR", "IBILI" in the last 72 hours. PT/INR No results for input(s): "INR" in the last 72 hours.    Imaging/Other results: No results found.    Assessment and Plan: Impression and Recommendations:  [name] is a 72 y.o. female s/p EGD with Transoral Incisionless Fundoplication (TIF) completed yesterday with no events on overnight observation. Ok for d/c to home today  with the following plan:   - Protonix 40 mg PO BID for 2  weeks, then 40 mg daily for 2 weeks, then 20 mg daily for 1 week, then prn  - Discharge with Zofran 4 mg PO prn Q6 hours for nausea  - Discharge with Reglan 10 mg PO prn Q6 hours for nausea  - Discharge with Simethicone 80 mg PO prn Q6 hours for bloating/abdominal discomfort   Diet:  - 2 weeks of liquid soft foods followed by 4 weeks slowly progressive diet back to regular  - Provided with handout for post operative diet plan   Post Op Activity:  - Week 1: encourage short distance walking, minimal physical activity, no lifting >5 lbs  - Week 2: Slow climbing stairs, no intense exercise, no lifting >5 lbs  - Week 3-6: No intense exercise, may lift up to 25 lbs  - Week 7: Resume normal activity   - To follow-up with Dr. Barron Alvine 10/09/23 at 2:40 in the [facility]  - Sincerely appreciate the assistance by the Hospital service in the admission and overnight observation of this patient.  Left Neck pain/SCM Worse with movement, no associated symptoms Likely related to positioning from surgery Toradol has been given here, add on lidocaine patches.  Warm compresses, tylenol as needed Can hopefully go home today with lidocaine patches, warm compresses, can send in tramadol 50 mg q 6 hours for 8 days for pain and tylenol for pain control  Doree Albee, DO  09/21/2023, 8:37 AM [facility]   Attending physician's note   I have reviewed the chart and discussed her care on rounds.  I performed a substantive portion of this encounter, including complete performance of at least one of the key  components, in conjunction with the APP. I agree with the APP's note, impression, and recommendations with my edits. Ok for discharge home with follow-up with me in the GI clinic.  [address], DO, FACG [phone] office

## 2023-09-21 NOTE — Discharge Summary (Signed)
Physician Discharge Summary   [name] VHQ:[id number] DOB: Jun 03, 1951 DOA: 09/20/2023  PCP: Tanya Cowboy, MD  Admit date: 09/20/2023 Discharge date: 09/21/2023   Admitted From: Home Disposition:  Home Discharging physician: Tanya Chamber, MD Barriers to discharge: none  Recommendations at discharge: Follow up with GI and surgery  Discharge Condition: stable CODE STATUS: Full Diet recommendation:  Diet Orders (From admission, onward)     Start     Ordered   09/20/23 1839  Diet clear liquid Fluid consistency: Thin  Diet effective now       Question:  Fluid consistency:  Answer:  Thin   09/20/23 1838            Hospital Course: [name] is a 72 y.o. female with medical history significant for HTN, HLD, severe GERD, DCIS of the left breast status post successful treatment on surveillance being admitted for observation overnight after laparoscopic hiatal hernia repair and transoral incisionless fundoplication on 10/24.  The morning following surgery she was having some complaints of left-sided neck pain.  There were no worrisome signs on exam notably no crepitus, stridor, abnormal breath sounds.  She was treated supportively with lidocaine patch and pain medications.  Pain slowly improved and she was considered stable for discharging home. She has adequate discharge instructions from surgery and outpatient follow-up scheduled.  Prescriptions were sent per specialist recommendations prior to discharge as well.  Assessment and Plan: No notes have been filed under this hospital service. Service: Hospitalist      The patient's acute and chronic medical conditions were treated accordingly. On day of discharge, patient was felt deemed stable for discharge. Patient/family member advised to call PCP or come back to ER if needed.   Principal Diagnosis: Hiatal hernia with GERD  Discharge Diagnoses: Active Hospital Problems   Diagnosis Date Noted   Hiatal hernia  with GERD 09/20/2023   Gastroesophageal reflux disease without esophagitis 09/20/2023   Hiatal hernia 09/20/2023    Resolved Hospital Problems  No resolved problems to display.     Discharge Instructions     Increase activity slowly   Complete by: As directed       Allergies as of 09/21/2023       Reactions   Dexamethasone Palpitations, Other (See Comments)   Jittery         Medication List     STOP taking these medications    famotidine 40 MG tablet Commonly known as: PEPCID   omeprazole 40 MG capsule Commonly known as: PRILOSEC       TAKE these medications    ALLEGRA-D 24 HOUR PO Take 1 tablet by mouth daily.   allopurinol 300 MG tablet Commonly known as: ZYLOPRIM TAKE 1 TABLET BY MOUTH ONCE DAILY FOR  GOUT  PREVENTION   aspirin EC 81 MG tablet Take 1 tablet (81 mg total) by mouth daily.   fenofibrate 145 MG tablet Commonly known as: TRICOR Take  1 tablet  Daily for Triglycerides  (Blood Fats)                                                    /              TAKE  BY                    MOUTH                   ONCE DAILY   metFORMIN 500 MG 24 hr tablet Commonly known as: GLUCOPHAGE-XR Take 2 tablets 2 x /day with Meals for Diabetes. What changed:  how much to take how to take this when to take this additional instructions   metoCLOPramide 10 MG tablet Commonly known as: REGLAN Take 1 tablet (10 mg total) by mouth every 6 (six) hours as needed for nausea.   ondansetron 4 MG disintegrating tablet Commonly known as: ZOFRAN-ODT Take 1 tablet (4 mg total) by mouth every 6 (six) hours as needed for nausea or vomiting.   oxyCODONE 5 MG immediate release tablet Commonly known as: Oxy IR/ROXICODONE Take 1 tablet (5 mg total) by mouth every 4 (four) hours as needed for moderate pain (pain score 4-6), severe pain (pain score 7-10) or breakthrough pain.   pantoprazole 20 MG tablet Commonly known as: Protonix Take 2 tablets  twice a day for 2 weeks, then 2 tablets daily for 2 weeks, then 1 tablet daily for 1 week then take daily as needed   phentermine 37.5 MG tablet Commonly known as: ADIPEX-P Take 1 tablet every Morning for Dieting & Weight Loss                                /                                                                   TAKE                                         BY                                                 MOUTH   pravastatin 20 MG tablet Commonly known as: PRAVACHOL Take 20 mg by mouth every Monday, Wednesday, and Friday.   pyridOXINE 100 MG tablet Commonly known as: VITAMIN B6 Take 100 mg by mouth daily.   simethicone 80 MG chewable tablet Commonly known as: MYLICON Chew 1 tablet (80 mg total) by mouth every 6 (six) hours as needed.   topiramate 50 MG tablet Commonly known as: TOPAMAX Take 1 to 2 tablets at Night for Dieting & Weight Loss                                   /  TAKE                                         BY                                                 MOUTH   triamterene-hydrochlorothiazide 37.5-25 MG tablet Commonly known as: MAXZIDE-25 Take 1/2 to 1 tablet Daily for BP & Fluid Retention What changed:  how much to take how to take this when to take this additional instructions   VITAMIN B-12 PO Take 1 tablet by mouth daily.   vitamin C 1000 MG tablet Take 1,000 mg by mouth daily.   VITAMIN D PO Take 10,000 Units by mouth daily.   zinc gluconate 50 MG tablet Take 50 mg by mouth daily.        Follow-up Information     Tanya Adu, MD. Go on 10/18/2023.   Specialty: General Surgery Why: For wound re-check ; Arrive at 1:15pm Contact information: [address] [place] [address] [phone]                Allergies  Allergen Reactions   Dexamethasone Palpitations and Other (See Comments)    Jittery     Consultations: GI General  surgery  Procedures: 09/20/23 PROCEDURE:  Procedure(s): LAPAROSCOPIC REPAIR OF HIATAL HERNIA - [name] ESOPHAGOGASTRODUODENOSCOPY (EGD) TRANSORAL INCISIONLESS FUNDOPLICATION - [name] LAPAROSCOPIC BILATERAL TAP BLOCK  Discharge Exam: BP (!) 146/78 (BP Location: Right Arm)   Pulse 74   Temp 98.2 F (36.8 C) (Oral)   Resp 17   Wt 69.9 kg   SpO2 95%   BMI 23.42 kg/m  Physical Exam Constitutional:      Appearance: Normal appearance.  HENT:     Head: Normocephalic and atraumatic.     Mouth/Throat:     Mouth: Mucous membranes are moist.  Eyes:     Extraocular Movements: Extraocular movements intact.  Cardiovascular:     Rate and Rhythm: Normal rate and regular rhythm.  Pulmonary:     Effort: Pulmonary effort is normal. No respiratory distress.     Breath sounds: Normal breath sounds. No stridor. No wheezing.  Abdominal:     General: Bowel sounds are normal. There is no distension.     Palpations: Abdomen is soft.  Musculoskeletal:        General: Normal range of motion.     Cervical back: Normal range of motion and neck supple. Tenderness (mild left lateral TTP; no crepitus; no stridor nor wheezing) present. No rigidity.  Skin:    General: Skin is warm and dry.  Neurological:     General: No focal deficit present.     Mental Status: She is alert.  Psychiatric:        Mood and Affect: Mood normal.      The results of significant diagnostics from this hospitalization (including imaging, microbiology, ancillary and laboratory) are listed below for reference.   Microbiology: No results found for this or any previous visit (from the past 240 hour(s)).   Labs: BNP (last 3 results) No results for input(s): "BNP" in the last 8760 hours. Basic Metabolic Panel: No results for input(s): "NA", "K", "CL", "CO2", "GLUCOSE", "BUN", "CREATININE", "CALCIUM", "MG", "PHOS" in the last  168 hours. Liver Function Tests: No results for input(s): "AST", "ALT", "ALKPHOS", "BILITOT",  "PROT", "ALBUMIN" in the last 168 hours. No results for input(s): "LIPASE", "AMYLASE" in the last 168 hours. No results for input(s): "AMMONIA" in the last 168 hours. CBC: No results for input(s): "WBC", "NEUTROABS", "HGB", "HCT", "MCV", "PLT" in the last 168 hours. Cardiac Enzymes: No results for input(s): "CKTOTAL", "CKMB", "CKMBINDEX", "TROPONINI" in the last 168 hours. BNP: Invalid input(s): "POCBNP" CBG: Recent Labs  Lab 09/21/23 0101 09/21/23 0504 09/21/23 0724 09/21/23 1247 09/21/23 1528  GLUCAP 148* 129* 114* 143* 97   D-Dimer No results for input(s): "DDIMER" in the last 72 hours. Hgb A1c Recent Labs    09/20/23 1714  HGBA1C 5.5   Lipid Profile No results for input(s): "CHOL", "HDL", "LDLCALC", "TRIG", "CHOLHDL", "LDLDIRECT" in the last 72 hours. Thyroid function studies No results for input(s): "TSH", "T4TOTAL", "T3FREE", "THYROIDAB" in the last 72 hours.  Invalid input(s): "FREET3" Anemia work up No results for input(s): "VITAMINB12", "FOLATE", "FERRITIN", "TIBC", "IRON", "RETICCTPCT" in the last 72 hours. Urinalysis    Component Value Date/Time   COLORURINE YELLOW 03/22/2023 0955   APPEARANCEUR CLEAR 03/22/2023 0955   LABSPEC 1.023 03/22/2023 0955   PHURINE 5.5 03/22/2023 0955   GLUCOSEU NEGATIVE 03/22/2023 0955   HGBUR NEGATIVE 03/22/2023 0955   BILIRUBINUR NEGATIVE 02/08/2017 0952   KETONESUR NEGATIVE 03/22/2023 0955   PROTEINUR NEGATIVE 03/22/2023 0955   UROBILINOGEN 0.2 05/14/2014 0959   NITRITE NEGATIVE 03/22/2023 0955   LEUKOCYTESUR 1+ (A) 03/22/2023 0955   Sepsis Labs No results for input(s): "WBC" in the last 168 hours.  Invalid input(s): "PROCALCITONIN", "LACTICIDVEN" Microbiology No results found for this or any previous visit (from the past 240 hour(s)).  Procedures/Studies: No results found.   Time coordinating discharge: Over 30 minutes    Tanya Chamber, MD  [facility] 09/21/2023, 4:58 PM

## 2023-09-21 NOTE — Progress Notes (Signed)
Reviewed d/c instructions with pt. All questions answered. Pt taken to front entrance via wheelchair to meet ride.

## 2023-09-21 NOTE — Op Note (Signed)
[facility] Patient Name: [name] Procedure Date: 09/20/2023 MRN: [id number] Attending MD: Doristine Locks , MD, [id number] Date of Birth: 1951/03/05 CSN: [id number] Age: 72 Admit Type: Outpatient Procedure:                Upper GI endoscopy w/ Transoral Incisionless                            Fundoplication and Savary dilation Indications:              For therapy of esophageal reflux, For therapy of                            hiatal hernia Providers:                Doristine Locks, DO, Gaynelle Adu, MD (co-surgeon),                            Priscella Mann, Technician Referring MD:              Medicines:                General Anesthesia Complications:            No immediate complications. Estimated Blood Loss:     Estimated blood loss was minimal. Procedure:                Pre-Anesthesia Assessment:                           - Prior to the procedure, a History and Physical                            was performed, and patient medications and                            allergies were reviewed. The patient's tolerance of                            previous anesthesia was also reviewed. The risks                            and benefits of the procedure and the sedation                            options and risks were discussed with the patient.                            All questions were answered, and informed consent                            was obtained. Prior Anticoagulants: The patient has                            taken no anticoagulant or antiplatelet agents. ASA  Grade Assessment: II - A patient with mild systemic                            disease. After reviewing the risks and benefits,                            the patient was deemed in satisfactory condition to                            undergo the procedure.                           After obtaining informed consent, the endoscope was                            passed  under direct vision. Throughout the                            procedure, the patient's blood pressure, pulse, and                            oxygen saturations were monitored continuously. The                            GIF-H190 ([id number]) Olympus endoscope was introduced                            through the mouth, and advanced to the second part                            of duodenum. The upper GI endoscopy was                            accomplished without difficulty. The patient                            tolerated the procedure well. Scope In: Scope Out: Findings:      The examined esophagus was normal. In anticipation of placement of the       larger caliber Esophyx device, the decision was made to perform       esophageal dilation. A guidewire was placed and the scope was withdrawn.       Dilation was performed with a Savary dilator with mild resistance at 17       mm. The dilation site was examined following endoscope reinsertion and       showed no bleeding, mucosal tear or perforation. Estimated blood loss:       none.      The Z-line was regular and was found 38 cm from the incisors. The       decision was made to perform transoral fundoplication with the EsophyX Z       system. Before the procedure, the gastroesophageal flap valve was       classified as Hill Grade II (fold present, opens with respiration). The       endoscope was withdrawn, placed through the plication device,  reinserted       into the patient and advanced past the level of the GE junction at 38 cm       from the incisors and into the stomach. Next, the endoscope was advanced       beyond the device and retroflexed. The first plication site was       identified at the 1 o'clock position. With the device in the proper       position, the helical retractor was deployed and tissue was pulled into       the mold before it was closed. The device was rotated, suction was       applied using the invaginator, then  the device was advanced slightly and       two H-shaped fasteners were placed. The device was reloaded and the       process repeated in order to deploy a total of six fasteners at the       first site. The device was then rotated to the 11 o'clock position after       which the helical retractor was used to grasp additional tissue within       the mold before rotation and deployment of a total of six fasteners at       the second site. To complete reconstruction of the valve, additional       fasteners were deployed at the following sites: four fasteners at 5       o'clock and four fasteners at 7 o'clock positions. In total, 20       fasteners contributed to create a valve measuring 3 cm in length which       involved 270 degrees of the circumference upon retroflexed view.       Following the procedure, the flap valve was reclassified as Hill Grade I       (prominent fold, tight to endoscope). The EsophyX device and endoscope       were then removed. Relook endoscopy was performed prior to the       conclusion of the case to confirm the above findings. Estimated blood       loss was minimal. There was a small, but appropriate mucosal disruption       in the proximal esophagus at the level of the UES. This was lavaged and       revealed disruption limited to the mucosal surface only.      The entire examined stomach was normal.      The examined duodenum was normal. Impression:               - Normal esophagus. Dilated with 17 mm Savary                            dilator.                           - Z-line regular, 38 cm from the incisors.                           - Normal stomach.                           - Normal examined duodenum.                           -  EsophyX transoral fundoplication was performed.                           - No specimens collected. Moderate Sedation:      Not Applicable - Patient had care per Anesthesia. Recommendation:           -Admit to the Hospitalist  service on the surgical                            ward for overnight observation with anticipated                            discharge tomorrow                           -Zofran 4 mg IV every 6 hours x24 hours, then prn                           -Reglan 10 mg every 6 hours x24 hours, then prn                           -Resume scopolamine patch x3 days (applied preop)                           -Protonix 40 mg p.o. BID x2 weeks, then 40 mg daily                            x2 weeks, then 20 mg daily x1 week then prn                           -Resume Pepcid 40 mg qhs at discharge for 2 weeks,                            then can change to prn                           -Gas-X (simethicone) 80 mg p.o. prn every 6 hours                            gas pain, abdominal discomfort                           -Tylenol 3 (APAP 120 mg/codeine 12 mg per 5 mL): 15                            mL's every 4 hours prn pain                           -Colace 100 mg p.o. twice daily if taking pain                            medications                           -  Clear liquid diet okay overnight                           -Okay to ambulate with assist around the ward                           -Please do not hesitate to contact me directly with                            any postoperative questions or concerns Procedure Code(s):        --- Professional ---                           [phone], Esophagogastroduodenoscopy, flexible,                            transoral; with insertion of guide wire followed by                            passage of dilator(s) through esophagus over guide                            wire Diagnosis Code(s):        --- Professional ---                           K21.9, Gastro-esophageal reflux disease without                            esophagitis                           K44.9, Diaphragmatic hernia without obstruction or                            gangrene CPT copyright 2022 American Medical  Association. All rights reserved. The codes documented in this report are preliminary and upon coder review may  be revised to meet current compliance requirements. Doristine Locks, MD 09/20/2023 3:52:16 PM Number of Addenda: 0

## 2023-09-21 NOTE — Progress Notes (Signed)
   09/21/23 1015  TOC Brief Assessment  Insurance and Status Reviewed  Patient has primary care physician Yes  Home environment has been reviewed Resides with spouse  Prior level of function: Independent at baseline  Prior/Current Home Services No current home services  Social Determinants of Health Reivew SDOH reviewed no interventions necessary  Readmission risk has been reviewed Yes  Transition of care needs no transition of care needs at this time

## 2023-09-21 NOTE — Progress Notes (Addendum)
1 Day Post-Op   Subjective/Chief Complaint: Liquids going well But having pretty bad L neck pain and some shoulder discomfort No nausea No dysphagia    Objective: Vital signs in last 24 hours: Temp:  [97.6 F (36.4 C)-98.7 F (37.1 C)] 98 F (36.7 C) (10/25 0506) Pulse Rate:  [55-76] 70 (10/25 0506) Resp:  [15-27] 18 (10/25 0506) BP: (134-165)/(62-88) 134/62 (10/25 0506) SpO2:  [92 %-100 %] 94 % (10/25 0506) Weight:  [69.9 kg] 69.9 kg (10/24 1141) Last BM Date : 09/20/23  Intake/Output from previous day: 10/24 0701 - 10/25 0700 In: 1443.2 [P.O.:300; I.V.:1143.2] Out: 300 [Urine:300] Intake/Output this shift: No intake/output data recorded.  Rubbing L neck Neck - symmetric, no crepitus Reg Nonlabored Soft, nd, incisions ok -, mild ttp as expected  Lab Results:  No results for input(s): "WBC", "HGB", "HCT", "PLT" in the last 72 hours. BMET No results for input(s): "NA", "K", "CL", "CO2", "GLUCOSE", "BUN", "CREATININE", "CALCIUM" in the last 72 hours. PT/INR No results for input(s): "LABPROT", "INR" in the last 72 hours. ABG No results for input(s): "PHART", "HCO3" in the last 72 hours.  Invalid input(s): "PCO2", "PO2"  Studies/Results: No results found.  Anti-infectives: Anti-infectives (From admission, onward)    Start     Dose/Rate Route Frequency Ordered Stop   09/20/23 1145  ceFAZolin (ANCEF) IVPB 2g/100 mL premix  Status:  Discontinued        2 g 200 mL/hr over 30 Minutes Intravenous On call to O.R. 09/20/23 1133 09/20/23 1140   09/20/23 1145  ceFAZolin (ANCEF) IVPB 2g/100 mL premix        2 g 200 mL/hr over 30 Minutes Intravenous  Once 09/20/23 1133 09/20/23 1341       Assessment/Plan: s/p Procedure(s): LAPAROSCOPIC REPAIR OF SLIDING HIATAL HERNIA (N/A) ESOPHAGOGASTRODUODENOSCOPY (EGD) (N/A) TRANSORAL INCISIONLESS FUNDOPLICATION (N/A) SAVORY DILATION  Vitals ok L neck discomfort could be from- instrumentation during surgery - no fever, no  tachy so doubt esohageal issue Will put on toradol and see how does with pain O/w oral intake good Diet progression per GI Discussed postop instructions from my perspective Assuming L neck pain improves and remains without fever/tachycardia and oral intake remains good this am - should be able to go home later today Will put f/u on chart. D/w dr Barron Alvine Pt to go out on liquid diet  LOS: 0 days    Gaynelle Adu 09/21/2023

## 2023-09-24 ENCOUNTER — Encounter: Payer: Self-pay | Admitting: Dermatology

## 2023-09-26 ENCOUNTER — Ambulatory Visit: Payer: PPO | Admitting: Dermatology

## 2023-09-26 ENCOUNTER — Encounter: Payer: Self-pay | Admitting: Dermatology

## 2023-09-26 DIAGNOSIS — L821 Other seborrheic keratosis: Secondary | ICD-10-CM | POA: Diagnosis not present

## 2023-09-26 DIAGNOSIS — L578 Other skin changes due to chronic exposure to nonionizing radiation: Secondary | ICD-10-CM

## 2023-09-26 DIAGNOSIS — W908XXA Exposure to other nonionizing radiation, initial encounter: Secondary | ICD-10-CM

## 2023-09-26 DIAGNOSIS — L820 Inflamed seborrheic keratosis: Secondary | ICD-10-CM | POA: Diagnosis not present

## 2023-09-26 NOTE — Patient Instructions (Addendum)

## 2023-09-26 NOTE — Progress Notes (Signed)
   Follow-Up Visit   Subjective  [name] is a 72 y.o. female who presents for the following: check spots arms, irritating, pt declines TBSE today The patient has spots, moles and lesions to be evaluated, some may be new or changing and the patient may have concern these could be cancer.   The following portions of the chart were reviewed this encounter and updated as appropriate: medications, allergies, medical history  Review of Systems:  No other skin or systemic complaints except as noted in HPI or Assessment and Plan.  Objective  Well appearing patient in no apparent distress; mood and affect are within normal limits.   A focused examination was performed of the following areas: Face, arms  Relevant exam findings are noted in the Assessment and Plan.  bil arms x 20 (20) Stuck on waxy paps with erythema    Assessment & Plan     Inflamed seborrheic keratosis (20) bil arms x 20  Symptomatic, irritating, patient would like treated.  Destruction of lesion - bil arms x 20 (20) Complexity: simple   Destruction method: cryotherapy   Informed consent: discussed and consent obtained   Timeout:  patient name, date of birth, surgical site, and procedure verified Lesion destroyed using liquid nitrogen: Yes   Region frozen until ice ball extended beyond lesion: Yes   Outcome: patient tolerated procedure well with no complications   Post-procedure details: wound care instructions given    ACTINIC DAMAGE - chronic, secondary to cumulative UV radiation exposure/sun exposure over time - diffuse scaly erythematous macules with underlying dyspigmentation - Recommend daily broad spectrum sunscreen SPF 30+ to sun-exposed areas, reapply every 2 hours as needed.  - Recommend staying in the shade or wearing long sleeves, sun glasses (UVA+UVB protection) and wide brim hats (4-inch brim around the entire circumference of the hat). - Call for new or changing lesions.  SEBORRHEIC  KERATOSIS - Stuck-on, waxy, tan-brown papules and/or plaques  - Benign-appearing - Discussed benign etiology and prognosis. - Observe - Call for any changes  Return for 3-4 months for ISK f/u.  I, Ardis Rowan, RMA, am acting as scribe for Armida Sans, MD .   Documentation: I have reviewed the above documentation for accuracy and completeness, and I agree with the above.  Armida Sans, MD

## 2023-09-30 NOTE — Patient Instructions (Signed)
Eating Plan After Hiatial Hernia Surgery   After a Nissen fundoplication procedure, it is common to have some difficulty swallowing. The part of your body that moves food and liquid from your mouth to your stomach (esophagus) will be swollen and may feel tight. It will take several weeks or months for your esophagus and stomach to heal. By following a special eating plan, you can prevent problems such as pain, swelling or pressure in the abdomen (bloating), gas, nausea, or diarrhea. What are tips for following this plan? Cooking Cook all foods until they are soft. Remove skins and seeds from fruits and vegetables before eating. Remove skin and gristle from meats. Grind or finely mince meats before eating. Avoid over-cooking meat. Dry, tough meat is more difficult to swallow. Avoid using oil when cooking, or use only a small amount of oil. Avoid using seasoning when cooking, or use only a small amount of seasoning. Toast bread before eating. This makes it easier to swallow. Meal planning Eat 6-8 small meals throughout the day. Right after the surgery, have a few meals that are only clear liquids. Clear liquids include: Water. Clear fruit juice, no pulp. Chicken, beef, or vegetable broth. Gelatin. Decaffeinated tea or coffee without milk. Popsicles or shaved ice. Depending on your progress, you may move to a full liquid diet as told by your health care provider. This includes clear liquids and the following: Dairy and alternative milks, such as soy milk. Strained creamed soups. Ice cream or sherbet. Pudding. Nutritional supplement drinks. Yogurt. A few days after surgery, you may be able to start eating a diet of soft foods. You may need to eat according to this plan for several weeks. Do not eat sweets or sweetened drinks at the beginning of a meal. Doing that may cause your stomach to empty faster than it should (dumping syndrome). Lifestyle Always sit upright when eating or  drinking. Eat slowly. Take small bites and chew food well before swallowing. Do not lie down after eating. Stay sitting up for 30 minutes or longer after each meal. Sip fluids between meals. Limit how much you drink at one time. With meals and snacks, have 4-8 oz (120-240 mL). This is equal to  cup-1 cup. Do not mix solid foods and liquids in the same mouthful. Drink enough fluid to keep your urine pale yellow. Do not chew gum or drink fluids through a straw. Doing those things may cause you to swallow extra air. General information Do not drink carbonated drinks or alcohol. Avoid foods and drinks that contain caffeine and chocolate. Avoid foods and drinks that contain citrus or tomato. Allow hot soups and drinks to cool before eating. Avoid foods that cause gas, such as beans, peas, broccoli, or cabbage. If dairy milk products cause diarrhea, avoid them or eat them in small amounts. Recommended foods Fruits Any soft-cooked fruits after skins and seeds are removed. Fruit juice. Vegetables Any soft-cooked vegetables after skins and seeds are removed. Vegetable juice. Grains Cooked cereals. Dry cereals softened with liquid. Cooked pasta, rice, or other grains. Toasted bread. Bland crackers, such as soda or graham crackers. Meats and other protein foods Tender cuts of meat, poultry, or fish after bones, skin, and gristle are removed. Poached, boiled, or scrambled eggs. Canned fish. Tofu. Creamy nut butters. Dairy Milk. Yogurt. Cottage cheese. Mild cheeses. Beverages Nutritional supplement drinks. Decaffeinated tea or coffee. Sports drinks. Fats and oils Butter. Margarine. Mayonnaise. Vegetable oil. Smooth salad dressing. Sweets and desserts Plain hard candy. Marshmallows. Pudding.  Ice cream. Gelatin. Sherbet. Seasoning and other foods Salt. Light seasonings. Mustard. Vinegar. The items listed above may not be a complete list of recommended foods and beverages. Contact a dietitian for  more information. Foods to avoid Fruits Oranges. Grapefruit. Lemons. Limes. Citrus juices. Dried fruit. Crunchy, raw fruits. Vegetables Tomato sauce. Tomato juice. Broccoli. Cauliflower. Cabbage. Brussels sprouts. Crunchy, raw vegetables. Grains High-fiber or bran cereal. Cereal with nuts, dried fruit, or coconut. Sweet breads, rolls, coffee cake, or donuts. Chewy or crusty breads. Popcorn. Meats and other protein foods Beans, peas, and lentils. Tough or fatty meats. Fried meats, chicken, or fish. Fried eggs. Nuts and seeds. Crunchy nut butters. Dairy Chocolate milk. Yogurt with chunks of fruit, nuts, seeds, or coconut. Strong cheeses. Beverages Carbonated soft drinks. Alcohol. Cocoa. Hot drinks. Fats and oils Bacon fat. Lard. Sweets and desserts Chocolate. Candy with nuts, coconut, or seeds. Peppermint. Cookies. Cakes. Pie crust. Seasoning and other foods Heavy seasonings. Chili sauce. Ketchup. Barbecue sauce. Rosita Fire. Horseradish. The items listed above may not be a complete list of foods and beverages to avoid. Contact a dietitian for more information. Summary Following this eating plan after a Nissen fundoplication is an important part of healing after surgery. After surgery, you will start with a clear liquid diet before you progress to full liquids and soft foods. You may need to eat soft foods for several weeks. Avoid eating foods that cause irritation, gas, nausea, diarrhea, or swelling or pressure in the abdomen (bloating), and avoid foods that are difficult to swallow. Talk with a dietitian about which dietary choices are best for you

## 2023-09-30 NOTE — Progress Notes (Unsigned)
Future Appointments  Date Time Provider Department  10/01/2023  2:00 PM Lucky Cowboy, MD [facility]  10/09/2023  2:40 PM Shellia Cleverly, DO [facility]  11/13/2023 10:30 AM Raynelle Dick, NP [facility]  01/16/2024 11:30 AM Deirdre Evener, MD [facility]  01/24/2024  8:30 AM Serena Croissant, MD [facility]  04/01/2024 10:00 AM Lucky Cowboy, MD [facility]     This very nice 72 y.o. MWF  was admitted to the hospital on  09/20/2023  and patient was discharged from the hospital on 09/21/2023 after laparoscopic hiatal hernia repair and transoral incisionless fundoplication by Dr Gaynelle Adu . The patient now presents for 10 day  follow up for transition from recent hospitalization.    Since hospital discharge , patient has done well.  The day after discharge  our clinical staff contacted the patient to assure stability and schedule a follow up appointment. The discharge summary, medications and diagnostic test results were reviewed before meeting with the patient. The patient was admitted for:   1. Hiatal hernia with GERD   2. Gastroesophageal reflux disease without esophagitis   3. Essential hypertension   4. Abnormal glucose      Hospitalization discharge instructions and medications are reconciled with the patient.      Patient is also followed with Hypertension, Hyperlipidemia, Pre-Diabetes and Vitamin D Deficiency.       Patient is treated for HTN (1998)  & BP has been controlled at home. Today's BP is at goal -  130/80. Patient has had no complaints of any cardiac type chest pain, palpitations, dyspnea/orthopnea/PND, dizziness, claudication, or dependent edema.       Hyperlipidemia is controlled with diet & meds. Patient denies myalgias or other med SE's. Last Lipids were  at goal :  Lab Results  Component Value Date   CHOL 136 03/22/2023   HDL 44 (L) 03/22/2023   LDLCALC 70 03/22/2023   TRIG 138 03/22/2023   CHOLHDL 3.1 03/22/2023         Also, the patient has PreDiabetes  and has had no symptoms of reactive hypoglycemia, diabetic polys, paresthesias or visual blurring.  Patient was started on Metformin 6 months ago for weight loss. Last A1c was Normal & at goal :  Lab Results  Component Value Date   HGBA1C 5.5 09/20/2023       Further, the patient also has history of Vitamin D Deficiency and supplements vitamin D without any suspected side-effects. Last vitamin D was  at goal -high normal :  Lab Results  Component Value Date   VD25OH 104 (H) 03/22/2023    Current Outpatient Medications on File Prior to Visit  Medication Sig   allopurinol  300 MG tablet TAKE 1 TABLETDAILY    VITAMIN C 1000 MG tablet Take daily.   aspirin EC 81 MG tablet Take 1 tablet daily.   VITAMIN D  Take 10,000 Units  daily.   VITAMIN B-12  Take 1 tablet daily.   Fenofibrate  145 MG tablet Take  1 tablet  Daily    Fexofenadine-Pseudoephedrine Take 1 tablet  daily.   metFORMIN -XR  500 MG  Take 2 tablets 2 x /day with Meals  (Patient taking differently: Take  daily.   metoCLOPramide  10 MG tablet Take 1 tablet every 6 (six) hours as needed for nausea.   ondansetron - ODT 4 MG Take 1 tablet every 6 hours as needed for nausea or vomiting.   oxyCODONE  5 MG  Take 1 tablet every 4   hours as needed    pantoprazole  20 MG tablet Take daily as needed   phentermine 37.5 MG tablet Take 1 tablet every Morning    PRAVACHOL 20 MG tablet Take  every MonWed Fri   pyridOXINE (VIT  B-6) 100 MG tablet Take  daily.   simethicone  80 MG chewable tablet  hours as needed.   topiramate 50 MG tablet Take 1 to 2 tablets at Night f   triamterene-hctz  37.5-25 MG tablet Take 1/2 to 1 tablet Daily(Patient taking differently: Take 0.5 tablets  daily.)   zinc 50 MG tablet Take 50 mg by mouth daily.     Allergies  Allergen Reactions   Dexamethasone Palpitations and Other (See Comments)    Jittery      PMHx:   Past Medical History:  Diagnosis Date    Actinic keratosis    Allergy    Anxiety    Arthritis    Basal cell carcinoma 2011   BCC nose   Bell palsy 02/04/2016   Left   Breast cancer (HCC)    Carpal tunnel syndrome on both sides 01/23/2017   Colon polyps    Complication of anesthesia    GERD (gastroesophageal reflux disease)    Hyperlipidemia    Hypertension 2005   IBS (irritable bowel syndrome)    Plantar fasciitis    Pneumonia    PONV (postoperative nausea and vomiting)    Pre-diabetes    Prediabetes    Immunization History  Administered Date(s) Administered   Influenza,inj,quad, With Preservative 08/27/2017   Influenza-Unspecified 10/02/2018, 10/27/2019   PFIZER Comirnaty(Gray Top)Covid-19 Tri-Sucrose Vaccine 09/05/2020, 06/07/2021   PFIZER(Purple Top)SARS-COV-2 Vaccination 01/03/2020, 01/24/2020   PPD Test 05/14/2014   Pneumococcal Polysaccharide-23 02/08/2017   Past Surgical History:  Procedure Laterality Date   ABDOMINAL HYSTERECTOMY  11/27/1988   basal cell carcinoma  11/27/2009   nose   BREAST LUMPECTOMY WITH RADIOACTIVE SEED LOCALIZATION Left 12/05/2019   Procedure: LEFT BREAST LUMPECTOMY WITH RADIOACTIVE SEED LOCALIZATION;  Surgeon: Ovidio Kin, MD;  Location: [facility];  Service: General;  Laterality: Left;   ESOPHAGOGASTRODUODENOSCOPY N/A 09/20/2023   Procedure: ESOPHAGOGASTRODUODENOSCOPY (EGD);  Surgeon: Shellia Cleverly, DO;  Location: [facility];  Service: Gastroenterology;  Laterality: N/A;   HIATAL HERNIA REPAIR N/A 09/20/2023   Procedure: LAPAROSCOPIC REPAIR OF SLIDING HIATAL HERNIA;  Surgeon: Gaynelle Adu, MD;  Location: [facility];  Service: General;  Laterality: N/A;   MASTECTOMY, PARTIAL Left    SAVORY DILATION  09/20/2023   Procedure: SAVORY DILATION;  Surgeon: Shellia Cleverly, DO;  Location: [facility];  Service: Gastroenterology;;   TENDON REPAIR Bilateral    plantar fasciitis   TONSILLECTOMY     TRANSORAL INCISIONLESS FUNDOPLICATION N/A 09/20/2023   Procedure: TRANSORAL  INCISIONLESS FUNDOPLICATION;  Surgeon: Shellia Cleverly, DO;  Location: [facility];  Service: Gastroenterology;  Laterality: N/A;   FHx:    Reviewed / unchanged  SHx:    Reviewed / unchanged  Systems Review:  Constitutional: Denies fever, chills, wt changes, headaches, insomnia, fatigue, night sweats, change in appetite. Eyes: Denies redness, blurred vision, diplopia, discharge, itchy, watery eyes.  ENT: Denies discharge, congestion, post nasal drip, epistaxis, sore throat, earache, hearing loss, dental pain, tinnitus, vertigo, sinus pain, snoring.  CV: Denies chest pain, palpitations, irregular heartbeat, syncope, dyspnea, diaphoresis, orthopnea, PND, claudication or edema. Respiratory: denies cough, dyspnea, DOE, pleurisy, hoarseness, laryngitis, wheezing.  Gastrointestinal: Denies dysphagia, odynophagia, heartburn, reflux, water brash,  abdominal pain or cramps, nausea, vomiting, bloating, diarrhea, constipation, hematemesis, melena, hematochezia  or hemorrhoids. Genitourinary: Denies dysuria, frequency, urgency, nocturia, hesitancy, discharge, hematuria or flank pain. Musculoskeletal: Denies arthralgias, myalgias, stiffness, jt. swelling, pain, limping or strain/sprain.  Skin: Denies pruritus, rash, hives, warts, acne, eczema or change in skin lesion(s). Neuro: No weakness, tremor, incoordination, spasms, paresthesia or pain. Psychiatric: Denies confusion, memory loss or sensory loss. Endo: Denies change in weight, skin or hair change.  Heme/Lymph: No excessive bleeding, bruising or enlarged lymph nodes.  Physical Exam  BP 130/80   Pulse 82   Temp 98 F (36.7 C)   Resp 16   Ht 5\' 6"  (1.676 m)   Wt 148 lb 3.2 oz (67.2 kg)   SpO2 96%   BMI 23.92 kg/m   Appears well nourished, well groomed  and in no distress.  Eyes: PERRLA, EOMs, conjunctiva no swelling or erythema. Sinuses: No frontal/maxillary tenderness ENT/Mouth: EAC's clear, TM's nl w/o erythema, bulging. Nares clear w/o  erythema, swelling, exudates. Oropharynx clear without erythema or exudates. Oral hygiene is good. Tongue normal, non obstructing. Hearing intact.  Neck: Supple. Thyroid nl. Car 2+/2+ without bruits, nodes or JVD. Chest: Respirations nl with BS clear & equal w/o rales, rhonchi, wheezing or stridor.  Cor: Heart sounds normal w/ regular rate and rhythm without sig. murmurs, gallops, clicks or rubs. Peripheral pulses normal and equal  without edema.  Abdomen: Soft & bowel sounds normal. Non-tender w/o guarding, rebound, hernias, masses or organomegaly.  Lymphatics: Unremarkable.  Musculoskeletal: Full ROM all peripheral extremities, joint stability, 5/5 strength and normal gait.  Skin: Warm, dry without exposed rashes, lesions or ecchymosis apparent.  Neuro: Cranial nerves intact, reflexes equal bilaterally. Sensory-motor testing grossly intact. Tendon reflexes grossly intact.  Pysch: Alert & oriented x 3.  Insight and judgement nl & appropriate. No ideations.  Assessment and Plan:   1. Hiatal hernia with GERD   2. Gastroesophageal reflux disease without esophagitis  - CBC with Differential/Platelet   3. Essential hypertension  - Continue medication, monitor blood pressure at home.  - Continue DASH diet.  Reminder to go to the ER if any CP,  SOB, nausea, dizziness, severe HA, changes vision/speech.  - CBC with Differential/Platelet - COMPLETE METABOLIC PANEL WITH GFR   4. Abnormal glucose  - COMPLETE METABOLIC PANEL WITH GFR   5. Medication management  - CBC with Differential/Platelet - COMPLETE METABOLIC PANEL WITH GFR  - Continue diet, exercise, lifestyle modifications.  - Monitor appropriate labs.       Discussed  regular exercise, BP monitoring, weight control to achieve/maintain BMI less than 25 and discussed meds and SE's. Recommended labs to assess and monitor clinical status with further disposition pending results of labs. Over 30 minutes of exam, counseling, chart  review was performed.   Marinus Maw, MD

## 2023-10-01 ENCOUNTER — Encounter: Payer: Self-pay | Admitting: Internal Medicine

## 2023-10-01 ENCOUNTER — Ambulatory Visit (INDEPENDENT_AMBULATORY_CARE_PROVIDER_SITE_OTHER): Payer: PPO | Admitting: Internal Medicine

## 2023-10-01 VITALS — BP 130/80 | HR 82 | Temp 98.0°F | Resp 16 | Ht 68.0 in | Wt 148.2 lb

## 2023-10-01 DIAGNOSIS — I10 Essential (primary) hypertension: Secondary | ICD-10-CM

## 2023-10-01 DIAGNOSIS — K219 Gastro-esophageal reflux disease without esophagitis: Secondary | ICD-10-CM | POA: Diagnosis not present

## 2023-10-01 DIAGNOSIS — K449 Diaphragmatic hernia without obstruction or gangrene: Secondary | ICD-10-CM

## 2023-10-01 DIAGNOSIS — R7309 Other abnormal glucose: Secondary | ICD-10-CM

## 2023-10-01 DIAGNOSIS — Z79899 Other long term (current) drug therapy: Secondary | ICD-10-CM | POA: Diagnosis not present

## 2023-10-02 ENCOUNTER — Encounter: Payer: Self-pay | Admitting: Internal Medicine

## 2023-10-02 LAB — COMPLETE METABOLIC PANEL WITH GFR
AG Ratio: 1.8 (calc) (ref 1.0–2.5)
ALT: 15 U/L (ref 6–29)
AST: 22 U/L (ref 10–35)
Albumin: 4.6 g/dL (ref 3.6–5.1)
Alkaline phosphatase (APISO): 85 U/L (ref 37–153)
BUN: 16 mg/dL (ref 7–25)
CO2: 29 mmol/L (ref 20–32)
Calcium: 9.9 mg/dL (ref 8.6–10.4)
Chloride: 101 mmol/L (ref 98–110)
Creat: 0.82 mg/dL (ref 0.60–1.00)
Globulin: 2.6 g/dL (ref 1.9–3.7)
Glucose, Bld: 84 mg/dL (ref 65–139)
Potassium: 3.8 mmol/L (ref 3.5–5.3)
Sodium: 139 mmol/L (ref 135–146)
Total Bilirubin: 0.7 mg/dL (ref 0.2–1.2)
Total Protein: 7.2 g/dL (ref 6.1–8.1)
eGFR: 76 mL/min/{1.73_m2} (ref >=60)

## 2023-10-02 LAB — CBC WITH DIFFERENTIAL/PLATELET
Absolute Lymphocytes: 1678 {cells}/uL (ref 850–3900)
Absolute Monocytes: 458 {cells}/uL (ref 200–950)
Basophils Absolute: 49 {cells}/uL (ref 0–200)
Basophils Relative: 0.8 %
Eosinophils Absolute: 122 {cells}/uL (ref 15–500)
Eosinophils Relative: 2 %
HCT: 40.1 % (ref 35.0–45.0)
Hemoglobin: 14.3 g/dL (ref 11.7–15.5)
MCH: 31.4 pg (ref 27.0–33.0)
MCHC: 35.7 g/dL (ref 32.0–36.0)
MCV: 88.1 fL (ref 80.0–100.0)
MPV: 9.1 fL (ref 7.5–12.5)
Monocytes Relative: 7.5 %
Neutro Abs: 3794 {cells}/uL (ref 1500–7800)
Neutrophils Relative %: 62.2 %
Platelets: 272 10*3/uL (ref 140–400)
RBC: 4.55 10*6/uL (ref 3.80–5.10)
RDW: 12.4 % (ref 11.0–15.0)
Total Lymphocyte: 27.5 %
WBC: 6.1 10*3/uL (ref 3.8–10.8)

## 2023-10-02 NOTE — Progress Notes (Signed)
<>*<>*<>*<>*<>*<>*<>*<>*<>*<>*<>*<>*<>*<>*<>*<>*<>*<>*<>*<>*<>*<>*<>*<>*<> <>*<>*<>*<>*<>*<>*<>*<>*<>*<>*<>*<>*<>*<>*<>*<>*<>*<>*<>*<>*<>*<>*<>*<>*<>  -  Test results slightly outside the reference range are not unusual. If there is anything important, I will review this with you,  otherwise it is considered normal test values.  If you have further questions,  please do not hesitate to contact me at the office or via My Chart.   <>*<>*<>*<>*<>*<>*<>*<>*<>*<>*<>*<>*<>*<>*<>*<>*<>*<>*<>*<>*<>*<>*<>*<>*<> <>*<>*<>*<>*<>*<>*<>*<>*<>*<>*<>*<>*<>*<>*<>*<>*<>*<>*<>*<>*<>*<>*<>*<>*<>  -  CBC  is Normal  WBC & Red Cell Count ( Hgb)   <>*<>*<>*<>*<>*<>*<>*<>*<>*<>*<>*<>*<>*<>*<>*<>*<>*<>*<>*<>*<>*<>*<>*<>*<> <>*<>*<>*<>*<>*<>*<>*<>*<>*<>*<>*<>*<>*<>*<>*<>*<>*<>*<>*<>*<>*<>*<>*<>*<>  -  All Else -   Kidneys - Electrolytes - Liver -   all  Normal / OK  <>*<>*<>*<>*<>*<>*<>*<>*<>*<>*<>*<>*<>*<>*<>*<>*<>*<>*<>*<>*<>*<>*<>*<>*<> <>*<>*<>*<>*<>*<>*<>*<>*<>*<>*<>*<>*<>*<>*<>*<>*<>*<>*<>*<>*<>*<>*<>*<>*<>

## 2023-10-06 ENCOUNTER — Encounter: Payer: Self-pay | Admitting: Dermatology

## 2023-10-08 ENCOUNTER — Ambulatory Visit: Payer: PPO | Admitting: Nurse Practitioner

## 2023-10-09 ENCOUNTER — Encounter: Payer: Self-pay | Admitting: Gastroenterology

## 2023-10-09 ENCOUNTER — Ambulatory Visit: Payer: PPO | Admitting: Gastroenterology

## 2023-10-09 VITALS — BP 122/76 | HR 66 | Ht 68.0 in | Wt 146.0 lb

## 2023-10-09 DIAGNOSIS — K219 Gastro-esophageal reflux disease without esophagitis: Secondary | ICD-10-CM | POA: Diagnosis not present

## 2023-10-09 DIAGNOSIS — Z9889 Other specified postprocedural states: Secondary | ICD-10-CM

## 2023-10-09 DIAGNOSIS — Z8719 Personal history of other diseases of the digestive system: Secondary | ICD-10-CM

## 2023-10-09 MED ORDER — PANTOPRAZOLE SODIUM 20 MG PO TBEC: DELAYED_RELEASE_TABLET | ORAL | 1 refills | Status: DC

## 2023-10-09 NOTE — Patient Instructions (Signed)
_______________________________________________________  If your blood pressure at your visit was 140/90 or greater, please contact your primary care physician to follow up on this.  _______________________________________________________  If you are age 72 or older, your body mass index should be between 23-30. Your Body mass index is 22.2 kg/m. If this is out of the aforementioned range listed, please consider follow up with your Primary Care Provider.  If you are age 7 or younger, your body mass index should be between 19-25. Your Body mass index is 22.2 kg/m. If this is out of the aformentioned range listed, please consider follow up with your Primary Care Provider.   ________________________________________________________  The [facility] providers would like to encourage you to use [facility] to communicate with providers for non-urgent requests or questions.  Due to long hold times on the telephone, sending your provider a message by [facility] may be a faster and more efficient way to get a response.  Please allow 48 business hours for a response.  Please remember that this is for non-urgent requests.  _______________________________________________________  We have sent the following medications to your pharmacy for you to pick up at your convenience:  Pantoprazole 20mg  take 2 tablets daily for 2 weeks, then 1 tablet daily for 1 week, then as needed.  You will follow up in our office in 6 -12 months.  It was a pleasure to see you today!  Vito Cirigliano, D.O.

## 2023-10-09 NOTE — Progress Notes (Signed)
Chief Complaint:    Postoperative follow-up  GI History: 72 year old female with a history of HTN, HLD, prediabetes, vitamin D deficiency, gout, initially referred to me by Dr. Ewing Schlein for refractory reflux and discussion of antireflux surgical options.  Reflux symptoms started about 3 years ago.  Started with increased sinus drainage, then developed regurgitation, increased throat clearing, dry cough.  Symptoms occurs throughout the day and nocturnal breakthrough as well.  Has been treated with high-dose PPI (briefly went up to omeprazole 80 mg bid) and added Pepcid 20 mg at nighttime without much relief.  Sleeps with HOB elevated, avoid eating within 3+ hours of bedtime.  Was evaluated by Dr. Pollyann Kennedy in the ENT Clinic.  CT sinuses were normal, exam unremarkable to include normal indirect laryngoscopy, and felt sxs were predominantly reflux related.      GERD history: -Index symptoms: Nocturnal regurgitation, sinus drainage, dry cough, increased throat clearing, waterbrash. No dysphagia.  -Exacerbating features: Supine/night time -Medications trialed: Omeprazole, Pepcid, Carafate -Current medications: Omeprazole 40 mg daily, Pepcid 20 mg daily -Complications: Hiatal hernia   GERD evaluation: - Last EGD: 08/2021 - Barium esophagram: 08/22/2021: Severe gastroesophageal reflux extending to the cervical esophagus.  Otherwise normal - Esophageal Manometry: None - pH/Impedance: None - Bravo: None - GES: 04/06/2023: Normal   Endoscopic History: - 09/21/2021: EGD: Small hiatal hernia, normal stomach and duodenum - 08/06/2023: EGD: Normal esophagus, 1-2 cm sliding hiatal hernia with Hill grade 3 valve.  Normal stomach and duodenum - 09/20/2023: Concomitant laparoscopic hiatal hernia repair and TIF   - 12/19/2021: Colonoscopy: Hemorrhoids, diverticulosis, small adenoma in the sigmoid colon.  Repeat in 5 years.  HPI:     Patient is a 72 y.o. female presenting to the Gastroenterology Clinic for  postoperative follow-up after concomitant laparoscopic hiatal hernia repair and TIF on 09/20/2023.  Was discharged home with Protonix 40 mg bid x 2 weeks, then plan to taper.  Had follow-up with her PCM on 10/01/2023.  Today, she states she is doing well after surgery. Shoulder and neck pain abating, and abdominal incision pain improving. Tolerating post op diet. No breakthorugh reflux sxs and can sleep flat and thorugh the night again for first time in 2+ years!  She is very happy with surgical outcomes.  Has appoint with Dr. Andrey Campanile next week.  Reviewed labs from last week: Normal CBC and CMP.  Review of systems:     No chest pain, no SOB, no fevers, no urinary sx   Past Medical History:  Diagnosis Date   Actinic keratosis    Allergy    Anxiety    Arthritis    Basal cell carcinoma 2011   BCC nose   Bell palsy 02/04/2016   Left   Breast cancer (HCC)    Carpal tunnel syndrome on both sides 01/23/2017   Colon polyps    Complication of anesthesia    GERD (gastroesophageal reflux disease)    Hyperlipidemia    Hypertension 2005   IBS (irritable bowel syndrome)    Plantar fasciitis    Pneumonia    PONV (postoperative nausea and vomiting)    Pre-diabetes    Prediabetes     Patient's surgical history, family medical history, social history, medications and allergies were all reviewed in Epic    Current Outpatient Medications  Medication Sig Dispense Refill   allopurinol (ZYLOPRIM) 300 MG tablet TAKE 1 TABLET BY MOUTH ONCE DAILY FOR  GOUT  PREVENTION 90 tablet 3   Ascorbic Acid (VITAMIN C) 1000 MG tablet  Take 1,000 mg by mouth daily.     aspirin EC 81 MG tablet Take 1 tablet (81 mg total) by mouth daily.     Cholecalciferol (VITAMIN D PO) Take 10,000 Units by mouth daily.     Cyanocobalamin (VITAMIN B-12 PO) Take 1 tablet by mouth daily.     fenofibrate (TRICOR) 145 MG tablet Take  1 tablet  Daily for Triglycerides  (Blood Fats)                                                     /              TAKE                          BY                    MOUTH                   ONCE DAILY 90 tablet 3   metFORMIN (GLUCOPHAGE-XR) 500 MG 24 hr tablet Take 2 tablets 2 x /day with Meals for Diabetes. (Patient taking differently: Take 500 mg by mouth daily.) 360 tablet 3   metoCLOPramide (REGLAN) 10 MG tablet Take 1 tablet (10 mg total) by mouth every 6 (six) hours as needed for nausea. 30 tablet 1   ondansetron (ZOFRAN-ODT) 4 MG disintegrating tablet Take 1 tablet (4 mg total) by mouth every 6 (six) hours as needed for nausea or vomiting. 30 tablet 1   oxyCODONE (OXY IR/ROXICODONE) 5 MG immediate release tablet Take 1 tablet (5 mg total) by mouth every 4 (four) hours as needed for moderate pain (pain score 4-6), severe pain (pain score 7-10) or breakthrough pain. 30 tablet 0   pantoprazole (PROTONIX) 20 MG tablet Take 2 tablets twice a day for 2 weeks, then 2 tablets daily for 2 weeks, then 1 tablet daily for 1 week then take daily as needed 70 tablet 1   pravastatin (PRAVACHOL) 20 MG tablet Take 20 mg by mouth every Monday, Wednesday, and Friday.     pyridOXINE (VITAMIN B-6) 100 MG tablet Take 100 mg by mouth daily.     simethicone (MYLICON) 80 MG chewable tablet Chew 1 tablet (80 mg total) by mouth every 6 (six) hours as needed. 30 tablet 1   triamterene-hydrochlorothiazide (MAXZIDE-25) 37.5-25 MG tablet Take 1/2 to 1 tablet Daily for BP & Fluid Retention (Patient taking differently: Take 0.5 tablets by mouth daily.) 90 tablet 3   zinc gluconate 50 MG tablet Take 50 mg by mouth daily.     No current facility-administered medications for this visit.    Physical Exam:     BP 122/76   Pulse 66   Ht 5\' 8"  (1.727 m)   Wt 146 lb (66.2 kg)   BMI 22.20 kg/m   GENERAL:  Pleasant female in NAD PSYCH: : Cooperative, normal affect Musculoskeletal:  Normal muscle tone, normal strength NEURO: Alert and oriented x 3, no focal neurologic deficits   IMPRESSION and PLAN:    1) GERD- s/p  TIF 2) Hiatal hernia s/p repair 3) Fundoplication 75) LPR 72 year old female with longstanding history of GERD c/b hiatal hernia, now s/p concomitant laparoscopic hiatal hernia pair and TIF on 09/20/2023.  Doing very well since surgery  with resolution of reflux symptoms.  LPR symptoms are improving as well.  - Start weaning PPI - Placed Rx for pantorpazole 20 mg tabs- 2 tabs daily x2 weeks, 1 tab daily x1 week at [facility] in [place] - Continue slowly advancing diet per postoperative protocol.  Discussed dietary advancement today - Continue advancing activity/exercise as tolerated and per postoperative protocol   RTC in 6-12 months or sooner as needed           Shellia Cleverly ,DO, FACG 10/09/2023, 2:49 PM

## 2023-10-12 DIAGNOSIS — J310 Chronic rhinitis: Secondary | ICD-10-CM | POA: Diagnosis not present

## 2023-10-12 DIAGNOSIS — K219 Gastro-esophageal reflux disease without esophagitis: Secondary | ICD-10-CM | POA: Diagnosis not present

## 2023-10-12 DIAGNOSIS — J300 Vasomotor rhinitis: Secondary | ICD-10-CM | POA: Diagnosis not present

## 2023-10-12 DIAGNOSIS — R053 Chronic cough: Secondary | ICD-10-CM | POA: Diagnosis not present

## 2023-10-26 ENCOUNTER — Encounter: Payer: Self-pay | Admitting: Internal Medicine

## 2023-10-26 ENCOUNTER — Other Ambulatory Visit: Payer: Self-pay | Admitting: Internal Medicine

## 2023-10-26 DIAGNOSIS — J329 Chronic sinusitis, unspecified: Secondary | ICD-10-CM

## 2023-10-26 MED ORDER — AMOXICILLIN-POT CLAVULANATE 500-125 MG PO TABS: ORAL_TABLET | ORAL | Status: DC

## 2023-10-26 MED ORDER — AMOXICILLIN-POT CLAVULANATE 500-125 MG PO TABS
ORAL_TABLET | ORAL | 0 refills | Status: DC
Start: 2023-10-26 — End: 2023-11-13

## 2023-10-26 MED ORDER — AMOXICILLIN-POT CLAVULANATE 500-125 MG PO TABS
ORAL_TABLET | ORAL | Status: DC
Start: 2023-10-26 — End: 2023-10-26

## 2023-10-26 MED ORDER — PSEUDOEPHEDRINE HCL ER 120 MG PO TB12: ORAL_TABLET | ORAL | 0 refills | Status: DC

## 2023-11-07 ENCOUNTER — Other Ambulatory Visit: Payer: Self-pay | Admitting: Internal Medicine

## 2023-11-07 DIAGNOSIS — M109 Gout, unspecified: Secondary | ICD-10-CM

## 2023-11-12 NOTE — Progress Notes (Signed)
MEDICARE ANNUAL WELLNESS VISIT AND FOLLOW UP  Assessment:    Diagnoses and all orders for this visit:  Encounter for Medicare annual wellness exam Due 1 year  Hypertension, benign essential, goal below 140/90 Continue medication: Maxzide 37.5/25 mg every day  Monitor blood pressure at home; call if consistently over 130/80 Continue DASH diet.   Reminder to go to the ER if any CP, SOB, nausea, dizziness, severe HA, changes vision/speech, left arm numbness and tingling and jaw pain. CBC, CMP  Fatty liver (2005 by U/S) Maintain healthy weight, avoid alcohol/tylenol, will monitor LFTs - CMP  Gout of big toe Continue allopurinol Diet discussed Check uric acid as needed  Abnormal Glucose Discussed disease and risks Discussed diet/exercise, weight management  A1C q22m, due  Mixed hyperlipidemia Continue medications: pravastatin 20 mg 3 days a week, fenofibrate 145 mg qd Continue low cholesterol diet and exercise.  Check lipid panel.    BMI 22 Continue diet and exercise  GERD Had fundoplication and repair of hiatal hernia 08/2023 Currently off medications  Vitamin D deficiency Continue Vit D supplementation to maintain value in therapeutic level of 60-100   Estrogen deficiency/postmenopausal - DEXA 11/24/22- osteopenia- T score - 1.4 Continue Vit D and weight bearing exercises  Anxiety Stress management techniques discussed, increase water, good sleep hygiene discussed, increase exercise, and increase veggies.    Environmental allergies?allergic rhinitis with post nasal drip Continue OTC allergy pills, Flonase and Neti pot Continue to follow with Allergist Offered ipratropium but pt declined  DCIS of L breast Follows with oncology, q59m follow up S/p lumpectomy, completed radiation, intolerant of tamoxifen  Last mammogram 11/24/22- benign  Elevated LFT - CMP Work on diet, exercise and weight loss.  Limit Tylenol and alcohol.  Will recheck at your next  visit  Medication management -     CBC with Differential/Platelet -     COMPLETE METABOLIC PANEL WITH GFR -     Lipid panel -     TSH       Over 30 minutes of exam, counseling, chart review, and critical decision making was performed  Future Appointments  Date Time Provider Department Center  01/16/2024 11:30 AM Deirdre Evener, MD [facility] None  01/24/2024  8:30 AM Serena Croissant, MD [facility] None  04/01/2024 10:00 AM Lucky Cowboy, MD [facility] None  11/13/2024 11:00 AM Raynelle Dick, NP [facility] None    Plan:   During the course of the visit the patient was educated and counseled about appropriate screening and preventive services including:   Pneumococcal vaccine  Influenza vaccine Td vaccine Prevnar 13 Screening electrocardiogram Screening mammography Bone densitometry screening Colorectal cancer screening Diabetes screening Glaucoma screening Nutrition counseling  Advanced directives: given info/requested copies   Subjective:   [name] is a 72 y.o. female who presents for annual medicare visit and follow on on chronic medical conditions.    She is followed by ortho Dr. Logan Bores for her feet, seeing Durenda Age, PA at [facility] clinic for injections.   She had fundoplication and hiatal hernia repair in 08/2023. Dr. Andrey Campanile and Dr. Barron Alvine did her surgeries. Has not been able to take medications regularly due to throat hurting.   She was diagnosed with DCIS Lt breast on 11/13/2019. Underwent L breast Lumpectomy on Dec 05, 2019 by Dr Ezzard Standing. Was on tamoxifen by Dr. Pamelia Hoit but couldn't tolerate and was d/c'd, close monitoring q38m alternating with surgeon, and completed adjuvent radiation treatments by Dr. Rushie Chestnut. Mammogram 10/2022  was benign repeat 1  year.   She has hx of anxiety, controlled without medication.   BMI is Body mass index is 22.87 kg/m., she is working on diet and exercise..  Continues to exercise Wt Readings from Last 3  Encounters:  11/13/23 150 lb 6.4 oz (68.2 kg)  10/09/23 146 lb (66.2 kg)  10/01/23 148 lb 3.2 oz (67.2 kg)   Her blood pressure has been controlled at home on Maxzide 37.5 mg/25 mg every day , today their BP is BP: 122/68 BP Readings from Last 3 Encounters:  11/13/23 122/68  10/09/23 122/76  10/01/23 130/80  She does workout. She denies chest pain, shortness of breath, dizziness.   She is on cholesterol medication ( pravastatin M-W-F,fenofibrate) and denies myalgias. Her cholesterol is at goal. The cholesterol last visit was:   Lab Results  Component Value Date   CHOL 136 03/22/2023   HDL 44 (L) 03/22/2023   LDLCALC 70 03/22/2023   TRIG 138 03/22/2023   CHOLHDL 3.1 03/22/2023   She has been working on diet and exercise for prediabetes, and denies foot ulcerations, hyperglycemia, hypoglycemia , increased appetite, nausea, paresthesia of the feet, polydipsia, polyuria, visual disturbances, vomiting and weight loss. Last A1C in the office was:  Lab Results  Component Value Date   HGBA1C 5.5 09/20/2023     Last GFR- trying to drink more fluids Lab Results  Component Value Date   EGFR 76 10/01/2023    Patient is on Vitamin D supplement. Lab Results  Component Value Date   VD25OH 104 (H) 03/22/2023     Patient is on allopurinol for gout and does not report a recent flare.  Lab Results  Component Value Date   LABURIC 4.6 03/22/2023   LFTs monitored closely due to hx of fatty liver per [place] in 2005, improved with recent weight loss.  Lab Results  Component Value Date   ALT 15 10/01/2023   AST 22 10/01/2023   ALKPHOS 86 09/04/2023   BILITOT 0.7 10/01/2023     Medication Review Current Outpatient Medications on File Prior to Visit  Medication Sig Dispense Refill   allopurinol (ZYLOPRIM) 300 MG tablet TAKE 1 TABLET BY MOUTH ONCE DAILY FOR GOUT PREVENTION 90 tablet 0   Ascorbic Acid (VITAMIN C) 1000 MG tablet Take 1,000 mg by mouth daily.     Cyanocobalamin (VITAMIN B-12  PO) Take 1 tablet by mouth daily.     fenofibrate (TRICOR) 145 MG tablet Take  1 tablet  Daily for Triglycerides  (Blood Fats)                                                    /              TAKE                          BY                    MOUTH                   ONCE DAILY 90 tablet 3   Fexofenadine HCl (ALLEGRA PO) Take by mouth.     metFORMIN (GLUCOPHAGE-XR) 500 MG 24 hr tablet Take 2 tablets 2 x /day with Meals for Diabetes. (Patient taking differently: Take 500 mg  by mouth daily.) 360 tablet 3   pravastatin (PRAVACHOL) 20 MG tablet Take 20 mg by mouth every Monday, Wednesday, and Friday.     pseudoephedrine (SUDAFED) 120 MG 12 hr tablet Take  1 tablet  2 x /  (every 12 hours)  for Sinus & Chest Congestion 20 tablet 0   pyridOXINE (VITAMIN B-6) 100 MG tablet Take 100 mg by mouth daily.     simethicone (MYLICON) 80 MG chewable tablet Chew 1 tablet (80 mg total) by mouth every 6 (six) hours as needed. 30 tablet 1   triamterene-hydrochlorothiazide (MAXZIDE-25) 37.5-25 MG tablet Take 1/2 to 1 tablet Daily for BP & Fluid Retention (Patient taking differently: Take 0.5 tablets by mouth daily.) 90 tablet 3   zinc gluconate 50 MG tablet Take 50 mg by mouth daily.     amoxicillin-clavulanate (AUGMENTIN) 500-125 MG tablet Take  1 tablet   3 x / day   after meals for Sinus Infection (Patient not taking: Reported on 11/13/2023) 30 tablet 0   aspirin EC 81 MG tablet Take 1 tablet (81 mg total) by mouth daily. (Patient not taking: Reported on 11/13/2023)     Cholecalciferol (VITAMIN D PO) Take 10,000 Units by mouth daily. (Patient not taking: Reported on 11/13/2023)     No current facility-administered medications on file prior to visit.    Allergies: Allergies  Allergen Reactions   Dexamethasone Palpitations and Other (See Comments)    Jittery     Current Problems (verified) has Hypertension, benign essential, goal below 140/90; Hyperlipidemia; Environmental allergies; Anxiety; History of Bell's  palsy; Prediabetes; BMI 23.0-23.9, adult; Gout of big toe; Fatty liver (2005 by U/S); Family history of cardiac disorder in mother; Ductal carcinoma in situ (DCIS) of left breast; Gastroesophageal reflux disease without esophagitis; Hiatal hernia; and Hiatal hernia with GERD on their problem list.  Screening Tests Immunization History  Administered Date(s) Administered   Influenza,inj,quad, With Preservative 08/27/2017   Influenza,trivalent, recombinat, inj, PF 08/17/2023   Influenza-Unspecified 10/02/2018, 10/27/2019   PFIZER Comirnaty(Gray Top)Covid-19 Tri-Sucrose Vaccine 09/05/2020, 06/07/2021   PFIZER(Purple Top)SARS-COV-2 Vaccination 01/03/2020, 01/24/2020   PPD Test 05/14/2014   Pneumococcal Polysaccharide-23 02/08/2017   Unspecified SARS-COV-2 Vaccination 08/17/2023   Health Maintenance  Topic Date Due   COVID-19 Vaccine (6 - 2024-25 season) 11/29/2023 (Originally 10/12/2023)   DTaP/Tdap/Td (1 - Tdap) 11/12/2024 (Originally 12/15/1969)   Pneumonia Vaccine 28+ Years old (2 of 2 - PCV) 11/12/2024 (Originally 02/08/2018)   MAMMOGRAM  11/25/2023   Medicare Annual Wellness (AWV)  11/12/2024   Colonoscopy  12/19/2026   DEXA SCAN  Completed   HPV VACCINES  Aged Out   Zoster Vaccines- Shingrix  Discontinued   Mammogram 11/22/21  Names of Other Physician/Practitioners you currently use: 1. [facility]- here for primary care 2. [employer], appt scheduled 10/2022 3. Dr. Araceli Bouche,  dentist, last visit 10/2023  Patient Care Team: Lucky Cowboy, MD as PCP - General (Internal Medicine) [name], NP as Nurse Practitioner (Obstetrics and Gynecology) Vida Rigger, MD as Consulting Physician (Gastroenterology) Sheran Luz, MD as Consulting Physician (Physical Medicine and Rehabilitation) Elmon Else, MD as Consulting Physician (Dermatology) Serena Croissant, MD as Consulting Physician (Hematology and Oncology) Ovidio Kin, MD as  Consulting Physician (General Surgery) Carmina Miller, MD as Referring Physician (Radiation Oncology)  Surgical: She  has a past surgical history that includes Abdominal hysterectomy (11/27/1988); Tonsillectomy; Tendon repair (Bilateral); basal cell carcinoma (11/27/2009); Breast lumpectomy with radioactive seed localization (Left, 12/05/2019); Mastectomy, partial (Left); Hiatal  hernia repair (N/A, 09/20/2023); Esophagogastroduodenoscopy (N/A, 09/20/2023); Transoral incisionless fundoplication (N/A, 09/20/2023); and Savory dilation (09/20/2023). Family Her family history includes Arthritis in her mother; Cancer in her brother and father; Heart attack in her mother; Heart disease (age of onset: 25) in her mother; Hyperlipidemia in her mother; Hypertension in her brother and mother; Parkinson's disease in her brother; Stroke in her mother. Social history  She reports that she has never smoked. She has never used smokeless tobacco. She reports current alcohol use. She reports that she does not use drugs.  MEDICARE WELLNESS OBJECTIVES: Physical activity:  limited currently due to surgery Cardiac risk factors: Cardiac Risk Factors include: advanced age (>65men, >48 women);dyslipidemia;hypertension Depression/mood screen:      11/13/2023   11:10 AM  Depression screen PHQ 2/9  Decreased Interest 0  Down, Depressed, Hopeless 0  PHQ - 2 Score 0    ADLs:     11/13/2023   11:08 AM 09/21/2023    7:09 AM  In your present state of health, do you have any difficulty performing the following activities:  Hearing? 0 0  Vision? 0 0  Difficulty concentrating or making decisions? 0 0  Walking or climbing stairs? 0   Dressing or bathing? 0   Doing errands, shopping? 0 0  Preparing Food and eating ? N   Using the Toilet? N   In the past six months, have you accidently leaked urine? N   Do you have problems with loss of bowel control? N   Managing your Medications? N   Managing your Finances? N    Housekeeping or managing your Housekeeping? N       Cognitive Testing  Alert? Yes  Normal Appearance?Yes  Oriented to person? Yes  Place? Yes   Time? Yes  Recall of three objects?  Yes  Can perform simple calculations? Yes  Displays appropriate judgment?Yes  Can read the correct time from a watch face?Yes  EOL planning: Does Patient Have a Medical Advance Directive?: Yes Type of Advance Directive: Healthcare Power of Attorney, Living will Does patient want to make changes to medical advance directive?: No - Patient declined Copy of Healthcare Power of Attorney in Chart?: No - copy requested   Objective:   Today's Vitals   11/13/23 1036  BP: 122/68  Pulse: 79  Temp: 97.7 F (36.5 C)  SpO2: 97%  Weight: 150 lb 6.4 oz (68.2 kg)  Height: 5\' 8"  (1.727 m)      Wt Readings from Last 3 Encounters:  11/13/23 150 lb 6.4 oz (68.2 kg)  10/09/23 146 lb (66.2 kg)  10/01/23 148 lb 3.2 oz (67.2 kg)    Body mass index is 22.87 kg/m.  General appearance: alert, no distress, WD/WN,  female HEENT: normocephalic, sclerae anicteric, TMs pearly, nares patent, no discharge or erythema, pharynx normal Oral cavity: MMM, no lesions Neck: supple, no lymphadenopathy, no thyromegaly, no masses Heart: RRR, normal S1, S2, no murmurs Lungs: CTA bilaterally, no wheezes, rhonchi, or rales Abdomen: +bs, soft, non tender, non distended, no masses, no hepatomegaly, no splenomegaly Musculoskeletal: nontender, no swelling, no obvious deformity  Extremities: no edema, no cyanosis, no clubbing Pulses: 2+ symmetric, upper and lower extremities, normal cap refill Neurological: alert, oriented x 3, CN2-12 intact, strength normal upper extremities and lower extremities, sensation normal throughout, DTRs 2+ throughout, no cerebellar signs, gait normal Psychiatric: normal affect, behavior normal, pleasant   A trigger point injection was performed at the site of maximal tenderness using 1% plain Lidocaine  and  dexamethasone. This was well tolerated, and followed by instant relief of pain.    Medicare Attestation I have personally reviewed: The patient's medical and social history Their use of alcohol, tobacco or illicit drugs Their current medications and supplements The patient's functional ability including ADLs,fall risks, home safety risks, cognitive, and hearing and visual impairment Diet and physical activities Evidence for depression or mood disorders  The patient's weight, height, BMI, and visual acuity have been recorded in the chart.  I have made referrals, counseling, and provided education to the patient based on review of the above and I have provided the patient with a written personalized care plan for preventive services.     Revonda Humphrey ANP-C  [facility]  11/13/2023

## 2023-11-13 ENCOUNTER — Encounter: Payer: Self-pay | Admitting: Nurse Practitioner

## 2023-11-13 ENCOUNTER — Ambulatory Visit (INDEPENDENT_AMBULATORY_CARE_PROVIDER_SITE_OTHER): Payer: PPO | Admitting: Nurse Practitioner

## 2023-11-13 VITALS — BP 122/68 | HR 79 | Temp 97.7°F | Ht 68.0 in | Wt 150.4 lb

## 2023-11-13 DIAGNOSIS — D0512 Intraductal carcinoma in situ of left breast: Secondary | ICD-10-CM

## 2023-11-13 DIAGNOSIS — J309 Allergic rhinitis, unspecified: Secondary | ICD-10-CM

## 2023-11-13 DIAGNOSIS — F419 Anxiety disorder, unspecified: Secondary | ICD-10-CM

## 2023-11-13 DIAGNOSIS — Z0000 Encounter for general adult medical examination without abnormal findings: Secondary | ICD-10-CM

## 2023-11-13 DIAGNOSIS — M1A9XX0 Chronic gout, unspecified, without tophus (tophi): Secondary | ICD-10-CM | POA: Diagnosis not present

## 2023-11-13 DIAGNOSIS — E782 Mixed hyperlipidemia: Secondary | ICD-10-CM | POA: Diagnosis not present

## 2023-11-13 DIAGNOSIS — Z0001 Encounter for general adult medical examination with abnormal findings: Secondary | ICD-10-CM

## 2023-11-13 DIAGNOSIS — Z6823 Body mass index (BMI) 23.0-23.9, adult: Secondary | ICD-10-CM

## 2023-11-13 DIAGNOSIS — Z9109 Other allergy status, other than to drugs and biological substances: Secondary | ICD-10-CM

## 2023-11-13 DIAGNOSIS — K219 Gastro-esophageal reflux disease without esophagitis: Secondary | ICD-10-CM | POA: Diagnosis not present

## 2023-11-13 DIAGNOSIS — K760 Fatty (change of) liver, not elsewhere classified: Secondary | ICD-10-CM | POA: Diagnosis not present

## 2023-11-13 DIAGNOSIS — R6889 Other general symptoms and signs: Secondary | ICD-10-CM

## 2023-11-13 DIAGNOSIS — E559 Vitamin D deficiency, unspecified: Secondary | ICD-10-CM | POA: Diagnosis not present

## 2023-11-13 DIAGNOSIS — E2839 Other primary ovarian failure: Secondary | ICD-10-CM | POA: Diagnosis not present

## 2023-11-13 DIAGNOSIS — R0982 Postnasal drip: Secondary | ICD-10-CM

## 2023-11-13 DIAGNOSIS — I10 Essential (primary) hypertension: Secondary | ICD-10-CM

## 2023-11-13 DIAGNOSIS — R7309 Other abnormal glucose: Secondary | ICD-10-CM

## 2023-11-13 DIAGNOSIS — Z79899 Other long term (current) drug therapy: Secondary | ICD-10-CM

## 2023-11-13 NOTE — Patient Instructions (Signed)

## 2023-11-14 ENCOUNTER — Other Ambulatory Visit: Payer: Self-pay | Admitting: Internal Medicine

## 2023-11-14 ENCOUNTER — Encounter: Payer: Self-pay | Admitting: Nurse Practitioner

## 2023-11-14 DIAGNOSIS — E782 Mixed hyperlipidemia: Secondary | ICD-10-CM

## 2023-11-14 LAB — COMPLETE METABOLIC PANEL WITH GFR
AG Ratio: 1.6 (calc) (ref 1.0–2.5)
ALT: 18 U/L (ref 6–29)
AST: 22 U/L (ref 10–35)
Albumin: 4.4 g/dL (ref 3.6–5.1)
Alkaline phosphatase (APISO): 86 U/L (ref 37–153)
BUN: 19 mg/dL (ref 7–25)
CO2: 32 mmol/L (ref 20–32)
Calcium: 10.2 mg/dL (ref 8.6–10.4)
Chloride: 100 mmol/L (ref 98–110)
Creat: 0.79 mg/dL (ref 0.60–1.00)
Globulin: 2.7 g/dL (ref 1.9–3.7)
Glucose, Bld: 79 mg/dL (ref 65–99)
Potassium: 3.9 mmol/L (ref 3.5–5.3)
Sodium: 140 mmol/L (ref 135–146)
Total Bilirubin: 0.6 mg/dL (ref 0.2–1.2)
Total Protein: 7.1 g/dL (ref 6.1–8.1)
eGFR: 79 mL/min/{1.73_m2} (ref >=60)

## 2023-11-14 LAB — CBC WITH DIFFERENTIAL/PLATELET
Absolute Lymphocytes: 1775 {cells}/uL (ref 850–3900)
Absolute Monocytes: 462 {cells}/uL (ref 200–950)
Basophils Absolute: 61 {cells}/uL (ref 0–200)
Basophils Relative: 0.9 %
Eosinophils Absolute: 272 {cells}/uL (ref 15–500)
Eosinophils Relative: 4 %
HCT: 41.1 % (ref 35.0–45.0)
Hemoglobin: 14.7 g/dL (ref 11.7–15.5)
MCH: 31.5 pg (ref 27.0–33.0)
MCHC: 35.8 g/dL (ref 32.0–36.0)
MCV: 88.2 fL (ref 80.0–100.0)
MPV: 9.2 fL (ref 7.5–12.5)
Monocytes Relative: 6.8 %
Neutro Abs: 4230 {cells}/uL (ref 1500–7800)
Neutrophils Relative %: 62.2 %
Platelets: 243 10*3/uL (ref 140–400)
RBC: 4.66 10*6/uL (ref 3.80–5.10)
RDW: 12.9 % (ref 11.0–15.0)
Total Lymphocyte: 26.1 %
WBC: 6.8 10*3/uL (ref 3.8–10.8)

## 2023-11-14 LAB — LIPID PANEL
Cholesterol: 190 mg/dL (ref <200)
HDL: 53 mg/dL (ref >=50)
LDL Cholesterol (Calc): 109 mg/dL — ABNORMAL HIGH
Non-HDL Cholesterol (Calc): 137 mg/dL — ABNORMAL HIGH (ref <130)
Total CHOL/HDL Ratio: 3.6 (calc) (ref <5.0)
Triglycerides: 162 mg/dL — ABNORMAL HIGH (ref <150)

## 2023-11-14 LAB — TSH: TSH: 0.88 m[IU]/L (ref 0.40–4.50)

## 2023-11-14 MED ORDER — PRAVASTATIN SODIUM 20 MG PO TABS: ORAL_TABLET | ORAL | 3 refills | Status: DC

## 2023-11-26 DIAGNOSIS — Z1231 Encounter for screening mammogram for malignant neoplasm of breast: Secondary | ICD-10-CM | POA: Diagnosis not present

## 2023-11-26 LAB — HM MAMMOGRAPHY

## 2023-11-29 ENCOUNTER — Encounter: Payer: Self-pay | Admitting: Hematology and Oncology

## 2023-11-29 ENCOUNTER — Encounter: Payer: Self-pay | Admitting: Internal Medicine

## 2023-12-03 ENCOUNTER — Encounter: Payer: Self-pay | Admitting: Gastroenterology

## 2023-12-03 DIAGNOSIS — R0982 Postnasal drip: Secondary | ICD-10-CM

## 2023-12-05 ENCOUNTER — Encounter (INDEPENDENT_AMBULATORY_CARE_PROVIDER_SITE_OTHER): Payer: Self-pay | Admitting: Otolaryngology

## 2023-12-12 ENCOUNTER — Other Ambulatory Visit: Payer: Self-pay | Admitting: Gastroenterology

## 2024-01-03 ENCOUNTER — Telehealth: Payer: Self-pay | Admitting: Nurse Practitioner

## 2024-01-03 ENCOUNTER — Telehealth: Payer: Self-pay | Admitting: Internal Medicine

## 2024-01-03 ENCOUNTER — Other Ambulatory Visit: Payer: Self-pay | Admitting: Nurse Practitioner

## 2024-01-03 MED ORDER — PHENTERMINE HCL 37.5 MG PO TABS
ORAL_TABLET | ORAL | 0 refills | Status: DC
Start: 2024-01-03 — End: 2024-01-04

## 2024-01-03 NOTE — Telephone Encounter (Signed)
 Patient was called and advised who was taking new patient's at office. Patient stated she would check with insurance and call office back.     Copied from CRM [id number]. Topic: Appointments - Scheduling Inquiry for Clinic >> Jan 03, 2024  1:00 PM Merlynn A wrote: Reason for CRM: New Patient called in wanting to schedule with Dr. Marylynn. Patient was advised that she is not currently seeing new pts at this time. Patient stated she was recommended by her neighbors to see Dr. Tullo for care as her previous physician Dr. Tonita recently passed away. Patient is ok with appointment further out like in May. Please contact patient at [phone] to advise if Dr. Tullo will make exception to see patient.

## 2024-01-03 NOTE — Telephone Encounter (Signed)
 Phentermine  was sent to [facility] but should have went to [facility] instead. Can this be corrected?

## 2024-01-03 NOTE — Telephone Encounter (Signed)
 Pharmacy states that the patient is trying to get her refill on Phentermine  but due to Dr. Ricardo Chamber DEA being de-activated, they cannot fill the rx. Will you send in a new rx for there Phentermine ?

## 2024-01-04 ENCOUNTER — Telehealth (INDEPENDENT_AMBULATORY_CARE_PROVIDER_SITE_OTHER): Payer: Self-pay | Admitting: Otolaryngology

## 2024-01-04 ENCOUNTER — Encounter: Payer: Self-pay | Admitting: Internal Medicine

## 2024-01-04 ENCOUNTER — Other Ambulatory Visit: Payer: Self-pay | Admitting: Family

## 2024-01-04 MED ORDER — PHENTERMINE HCL 37.5 MG PO TABS: ORAL_TABLET | ORAL | 0 refills | Status: DC

## 2024-01-04 NOTE — Telephone Encounter (Signed)
 LVM with appt date, time and address for 01/07/24

## 2024-01-05 ENCOUNTER — Encounter: Payer: Self-pay | Admitting: Internal Medicine

## 2024-01-07 ENCOUNTER — Ambulatory Visit (INDEPENDENT_AMBULATORY_CARE_PROVIDER_SITE_OTHER): Payer: HMO | Admitting: Otolaryngology

## 2024-01-07 VITALS — BP 145/86 | HR 92 | Ht 68.0 in | Wt 152.0 lb

## 2024-01-07 DIAGNOSIS — R0982 Postnasal drip: Secondary | ICD-10-CM | POA: Insufficient documentation

## 2024-01-07 DIAGNOSIS — J310 Chronic rhinitis: Secondary | ICD-10-CM | POA: Diagnosis not present

## 2024-01-07 DIAGNOSIS — J343 Hypertrophy of nasal turbinates: Secondary | ICD-10-CM

## 2024-01-07 DIAGNOSIS — R0981 Nasal congestion: Secondary | ICD-10-CM

## 2024-01-07 HISTORY — DX: Hypertrophy of nasal turbinates: J34.3

## 2024-01-07 MED ORDER — IPRATROPIUM BROMIDE 0.06 % NA SOLN: 2.0000 | Freq: Two times a day (BID) | NASAL | 12 refills | Status: DC | PRN

## 2024-01-07 NOTE — Progress Notes (Signed)
 Patient ID: [name], female   DOB: [birth year], 74 y.o.   MRN: [id number]  CC: Chronic postnasal drainage  HPI:  [name] is a 73 y.o. female who presents today complaining of chronic postnasal drainage for the past 3+ years.  The drainage is constant and severe.  She wakes up frequently at night due to the postnasal drainage.  She has a history of gastroesophageal reflux.  She underwent fundoplication surgery 3 months ago.  She was previously seen by 2 separate ENT physicians.  Her CT scan was negative for chronic sinusitis.  She was previously treated with Flonase  and Atrovent  intermittently.  She has a history of seasonal allergies.  She is currently on Allegra daily.  She has no previous ENT surgery.  She denies any recent sinusitis.  Past Medical History:  Diagnosis Date   Actinic keratosis    Allergy    Anxiety    Arthritis    Basal cell carcinoma 2011   BCC nose   Bell palsy 02/04/2016   Left   Breast cancer (HCC)    Carpal tunnel syndrome on both sides 01/23/2017   Colon polyps    Complication of anesthesia    GERD (gastroesophageal reflux disease)    Hyperlipidemia    Hypertension 2005   IBS (irritable bowel syndrome)    Plantar fasciitis    Pneumonia    PONV (postoperative nausea and vomiting)    Pre-diabetes    Prediabetes     Past Surgical History:  Procedure Laterality Date   ABDOMINAL HYSTERECTOMY  11/27/1988   basal cell carcinoma  11/27/2009   nose   BREAST LUMPECTOMY WITH RADIOACTIVE SEED LOCALIZATION Left 12/05/2019   Procedure: LEFT BREAST LUMPECTOMY WITH RADIOACTIVE SEED LOCALIZATION;  Surgeon: Juanita Norlander, MD;  Location: [facility];  Service: General;  Laterality: Left;   ESOPHAGOGASTRODUODENOSCOPY N/A 09/20/2023   Procedure: ESOPHAGOGASTRODUODENOSCOPY (EGD);  Surgeon: Annis Kinder, DO;  Location: [facility];  Service: Gastroenterology;  Laterality: N/A;   HIATAL HERNIA REPAIR N/A 09/20/2023   Procedure: LAPAROSCOPIC REPAIR OF  SLIDING HIATAL HERNIA;  Surgeon: Aldean Hummingbird, MD;  Location: [facility];  Service: General;  Laterality: N/A;   MASTECTOMY, PARTIAL Left    SAVORY DILATION  09/20/2023   Procedure: SAVORY DILATION;  Surgeon: Annis Kinder, DO;  Location: [facility];  Service: Gastroenterology;;   TENDON REPAIR Bilateral    plantar fasciitis   TONSILLECTOMY     TRANSORAL INCISIONLESS FUNDOPLICATION N/A 09/20/2023   Procedure: TRANSORAL INCISIONLESS FUNDOPLICATION;  Surgeon: Annis Kinder, DO;  Location: [facility];  Service: Gastroenterology;  Laterality: N/A;    Family History  Problem Relation Age of Onset   Hypertension Mother    Heart attack Mother    Hyperlipidemia Mother    Heart disease Mother 73       fatal MI   Stroke Mother    Arthritis Mother        rheumatoid   Cancer Father        lung   Hypertension Brother    Cancer Brother        throat, leukemia   Parkinson's disease Brother     Social History:  reports that she has never smoked. She has never used smokeless tobacco. She reports current alcohol use. She reports that she does not use drugs.  Allergies:  Allergies  Allergen Reactions   Dexamethasone  Palpitations and Other (See Comments)    Jittery     Prior to Admission medications  Medication Sig Start Date End Date Taking? Authorizing Provider  allopurinol  (ZYLOPRIM ) 300 MG tablet TAKE 1 TABLET BY MOUTH ONCE DAILY FOR GOUT PREVENTION 11/07/23  Yes Cranford, Lynnie Saucier, NP  Ascorbic Acid (VITAMIN C) 1000 MG tablet Take 1,000 mg by mouth daily.   Yes [provider]  aspirin  EC 81 MG tablet Take 1 tablet (81 mg total) by mouth daily. 02/05/20  Yes Gudena, Vinay, MD  Cholecalciferol (VITAMIN D  PO) Take 10,000 Units by mouth daily.   Yes [provider]  Cyanocobalamin (VITAMIN B-12 PO) Take 1 tablet by mouth daily.   Yes [provider]  fenofibrate  (TRICOR ) 145 MG tablet Take  1 tablet  Daily  for Triglycerides (Blood Fats)                                            /                                                                   TAKE                                         BY                                                 MOUTH      ? ? ?  ONCE ?? ? ? DAILY? ? ? 11/14/23  Yes Vangie Genet, MD  Fexofenadine HCl (ALLEGRA PO) Take by mouth.   Yes [provider]  ipratropium (ATROVENT ) 0.06 % nasal spray Place 2 sprays into both nostrils 2 (two) times daily as needed (nasal drainage). 01/07/24 02/06/24 Yes Reynold Caves, MD  metFORMIN  (GLUCOPHAGE -XR) 500 MG 24 hr tablet Take 2 tablets 2 x /day with Meals for Diabetes. Patient taking differently: Take 500 mg by mouth daily. 03/22/23  Yes Vangie Genet, MD  phentermine  (ADIPEX-P ) 37.5 MG tablet Take 1/2 to 1 tablet every Morning for Dieting & Weight Loss 01/04/24  Yes Webb, Padonda B, FNP  pravastatin  (PRAVACHOL ) 20 MG tablet Take  1 tablet  3 x / week  Mon Wed Fri for Cholesterol                               /                                                                   TAKE  BY                                                 MOUTH 11/14/23  Yes Vangie Genet, MD  pseudoephedrine  (SUDAFED) 120 MG 12 hr tablet Take  1 tablet  2 x /  (every 12 hours)  for Sinus & Chest Congestion 10/26/23  Yes Vangie Genet, MD  pyridOXINE (VITAMIN B-6) 100 MG tablet Take 100 mg by mouth daily.   Yes [provider]  simethicone  (MYLICON) 80 MG chewable tablet Chew 1 tablet (80 mg total) by mouth every 6 (six) hours as needed. 09/21/23  Yes Faith Homes, MD  triamterene -hydrochlorothiazide  (MAXZIDE -25) 37.5-25 MG tablet Take 1/2 to 1 tablet Daily for BP & Fluid Retention Patient taking differently: Take 0.5 tablets by mouth daily. 01/04/21  Yes Vangie Genet, MD  zinc gluconate 50 MG tablet Take 50 mg by mouth daily.   Yes [provider]    Blood pressure (!) 145/86, pulse 92, height 5\' 8"  (1.727 m), weight 152 lb (68.9 kg). Exam: General:  Communicates without difficulty, well nourished, no acute distress. Head: Normocephalic, no evidence injury, no tenderness, facial buttresses intact without stepoff. Face/sinus: No tenderness to palpation and percussion. Facial movement is normal and symmetric. Eyes: PERRL, EOMI. No scleral icterus, conjunctivae clear. Neuro: CN II exam reveals vision grossly intact.  No nystagmus at any point of gaze. Ears: Auricles well formed without lesions.  Ear canals are intact without mass or lesion.  No erythema or edema is appreciated.  The TMs are intact without fluid. Nose: External evaluation reveals normal support and skin without lesions.  Dorsum is intact.  Anterior rhinoscopy reveals congested mucosa over anterior aspect of inferior turbinates and intact septum.  No purulence noted. Oral:  Oral cavity and oropharynx are intact, symmetric, without erythema or edema.  Mucosa is moist without lesions. Neck: Full range of motion without pain.  There is no significant lymphadenopathy.  No masses palpable.  Thyroid  bed within normal limits to palpation.  Parotid glands and submandibular glands equal bilaterally without mass.  Trachea is midline. Neuro:  CN 2-12 grossly intact.   Procedure:  Flexible Nasal Endoscopy: Description: Risks, benefits, and alternatives of flexible endoscopy were explained to the patient.  Specific mention was made of the risk of throat numbness with difficulty swallowing, possible bleeding from the nose and mouth, and pain from the procedure.  The patient gave oral consent to proceed.  The flexible scope was inserted into the right nasal cavity.  Endoscopy of the interior nasal cavity, superior, inferior, and middle meatus was performed. The sphenoid-ethmoid recess was examined. Edematous mucosa was noted.  No polyp, mass, or lesion was appreciated. Olfactory cleft was clear.  Nasopharynx was clear.  Turbinates were hypertrophied but without mass.  The procedure was repeated on the  contralateral side with similar findings.  The patient tolerated the procedure well.   Assessment: 1.  Chronic rhinitis with nasal mucosal congestion and bilateral inferior turbinate hypertrophy. 2.  Chronic postnasal drainage. 3.  No polyps, mass, lesion, or acute infection is noted today.  Plan: 1.  The physical exam and nasal endoscopy findings are reviewed with the patient. 2.  The patient is reassured that no infection is noted today. 3.  Nasal saline irrigation daily. 4.  Atrovent  nasal spray 2 sprays each nostril twice daily as needed  nasal drainage. 5.  If the patient continues to be symptomatic in 1 to 2 months, she may be a candidate for cryotherapy ablation.  In that case, a referral to a rhinologist will be arranged.  Tifanie Gardiner W Joellyn Grandt 01/07/2024, 1:20 PM

## 2024-01-08 ENCOUNTER — Other Ambulatory Visit: Payer: Self-pay

## 2024-01-08 DIAGNOSIS — E782 Mixed hyperlipidemia: Secondary | ICD-10-CM

## 2024-01-08 MED ORDER — TRIAMTERENE-HCTZ 37.5-25 MG PO TABS: ORAL_TABLET | ORAL | 0 refills | Status: DC

## 2024-01-08 MED ORDER — PRAVASTATIN SODIUM 20 MG PO TABS
ORAL_TABLET | ORAL | 0 refills | Status: DC
Start: 2024-01-08 — End: 2024-04-04

## 2024-01-09 ENCOUNTER — Encounter: Payer: Self-pay | Admitting: Gastroenterology

## 2024-01-16 ENCOUNTER — Ambulatory Visit: Payer: PPO | Admitting: Dermatology

## 2024-01-18 ENCOUNTER — Telehealth: Payer: Self-pay | Admitting: Hematology and Oncology

## 2024-01-18 NOTE — Telephone Encounter (Signed)
Rescheduled appointment per provider [facility]. Patient is aware of the changes made to her upcoming appointment. 

## 2024-01-23 ENCOUNTER — Ambulatory Visit: Payer: Self-pay | Admitting: Hematology and Oncology

## 2024-01-24 ENCOUNTER — Ambulatory Visit: Payer: Medicare Other | Admitting: Hematology and Oncology

## 2024-02-04 ENCOUNTER — Other Ambulatory Visit: Payer: Self-pay

## 2024-02-04 DIAGNOSIS — M109 Gout, unspecified: Secondary | ICD-10-CM

## 2024-02-04 MED ORDER — ALLOPURINOL 300 MG PO TABS: ORAL_TABLET | ORAL | 0 refills | Status: DC

## 2024-02-05 ENCOUNTER — Inpatient Hospital Stay: Payer: Self-pay | Attending: Hematology and Oncology | Admitting: Hematology and Oncology

## 2024-02-05 ENCOUNTER — Other Ambulatory Visit: Payer: Self-pay | Admitting: *Deleted

## 2024-02-05 VITALS — BP 141/74 | HR 95 | Temp 97.8°F | Resp 14 | Ht 68.0 in | Wt 156.6 lb

## 2024-02-05 DIAGNOSIS — D0512 Intraductal carcinoma in situ of left breast: Secondary | ICD-10-CM

## 2024-02-05 DIAGNOSIS — M8580 Other specified disorders of bone density and structure, unspecified site: Secondary | ICD-10-CM | POA: Insufficient documentation

## 2024-02-05 DIAGNOSIS — Z170 Estrogen receptor positive status [ER+]: Secondary | ICD-10-CM | POA: Insufficient documentation

## 2024-02-05 DIAGNOSIS — Z79810 Long term (current) use of selective estrogen receptor modulators (SERMs): Secondary | ICD-10-CM | POA: Insufficient documentation

## 2024-02-05 NOTE — Progress Notes (Signed)
 Patient Care Team: Lucky Cowboy, MD as PCP - General (Internal Medicine) [name], NP as Nurse Practitioner (Obstetrics and Gynecology) Vida Rigger, MD as Consulting Physician (Gastroenterology) Sheran Luz, MD as Consulting Physician (Physical Medicine and Rehabilitation) Elmon Else, MD as Consulting Physician (Dermatology) Serena Croissant, MD as Consulting Physician (Hematology and Oncology) Ovidio Kin, MD as Consulting Physician (General Surgery) Carmina Miller, MD as Referring Physician (Radiation Oncology)  DIAGNOSIS:  Encounter Diagnosis  Name Primary?   Ductal carcinoma in situ (DCIS) of left breast Yes    SUMMARY OF ONCOLOGIC HISTORY: Oncology History  Ductal carcinoma in situ (DCIS) of left breast  11/17/2019 Initial Diagnosis   Screening mammogram detected left breast density UOQ 5 mm at 10 o'clock position.  Stereotactic biopsy revealed intermediate grade DCIS ER/PR positive   11/19/2019 Cancer Staging   Staging form: Breast, AJCC 8th Edition - Clinical stage from 11/19/2019: Stage 0 (cTis (DCIS), cN0, cM0, G2, ER+, PR+, HER2: Not Assessed)   12/05/2019 Surgery   Left lumpectomy Ezzard Standing) [phone]): DCIS, intermediate grade, 0.3cm, clear margins. No regional lymph nodes examined.   12/05/2019 Cancer Staging   Staging form: Breast, AJCC 8th Edition - Pathologic stage from 12/05/2019: Stage 0 (pTis (DCIS), pN0, cM0, ER+, PR+)   12/31/2019 - 02/03/2020 Radiation Therapy   Adjuvant radiation at [facility]   01/2020 - 01/2020 Anti-estrogen oral therapy   Tamoxifen 5 mg daily, discontinued due to severe debilitating side effects which included dizziness, lightheadedness, mental fogginess, irritability, hot flashes, etc.     CHIEF COMPLIANT: Palpable lump in the left breast  HISTORY OF PRESENT ILLNESS:  History of Present Illness The patient, with a history of breast cancer, presents for a routine follow-up. She reports overall good health and an  active lifestyle. She underwent a hernia operation in October, which she describes as 'not fun,' and is still experiencing some issues related to this. She has not noticed any significant weight changes. She recently experienced the sudden loss of her primary care physician, Dr. Gwendolyn Grant, and is currently seeking a new provider.  Regarding her breast health, she reports tenderness and a palpable lump in her breast. She describes the lump as a 'dip' followed by a 'bump,' which she can feel when sitting or lying down. She recently noticed this lump and is unsure if it is a new development.     ALLERGIES:  is allergic to dexamethasone.  MEDICATIONS:  Current Outpatient Medications  Medication Sig Dispense Refill   allopurinol (ZYLOPRIM) 300 MG tablet TAKE 1 TABLET BY MOUTH ONCE DAILY FOR  GOUT  PREVENTION 90 tablet 0   Ascorbic Acid (VITAMIN C) 1000 MG tablet Take 1,000 mg by mouth daily.     aspirin EC 81 MG tablet Take 1 tablet (81 mg total) by mouth daily.     Cholecalciferol (VITAMIN D PO) Take 10,000 Units by mouth daily.     Cyanocobalamin (VITAMIN B-12 PO) Take 1 tablet by mouth daily.     fenofibrate (TRICOR) 145 MG tablet Take  1 tablet  Daily  for Triglycerides (Blood Fats)                                           /  TAKE                                         BY                                                 MOUTH      ? ? ?  ONCE ?? ? ? DAILY? ? ? 90 tablet 3   Fexofenadine HCl (ALLEGRA PO) Take by mouth.     ipratropium (ATROVENT) 0.06 % nasal spray Place 2 sprays into both nostrils 2 (two) times daily as needed (nasal drainage). 15 mL 12   metFORMIN (GLUCOPHAGE-XR) 500 MG 24 hr tablet Take 2 tablets 2 x /day with Meals for Diabetes. (Patient taking differently: Take 500 mg by mouth daily.) 360 tablet 3   phentermine (ADIPEX-P) 37.5 MG tablet Take 1/2 to 1 tablet every Morning for Dieting & Weight Loss 30 tablet 0    pravastatin (PRAVACHOL) 20 MG tablet Take  1 tablet  3 x / week  Mon Wed Fri for Cholesterol                               /                                                                   TAKE                                         BY                                                 MOUTH 39 tablet 0   pseudoephedrine (SUDAFED) 120 MG 12 hr tablet Take  1 tablet  2 x /  (every 12 hours)  for Sinus & Chest Congestion 20 tablet 0   pyridOXINE (VITAMIN B-6) 100 MG tablet Take 100 mg by mouth daily.     simethicone (MYLICON) 80 MG chewable tablet Chew 1 tablet (80 mg total) by mouth every 6 (six) hours as needed. 30 tablet 1   triamterene-hydrochlorothiazide (MAXZIDE-25) 37.5-25 MG tablet Take 1/2 to 1 tablet Daily for BP & Fluid Retention 90 tablet 0   zinc gluconate 50 MG tablet Take 50 mg by mouth daily.     No current facility-administered medications for this visit.    PHYSICAL EXAMINATION: ECOG PERFORMANCE STATUS: 1 - Symptomatic but completely ambulatory  Vitals:   02/05/24 0833  BP: (!) 141/74  Pulse: 95  Resp: 14  Temp: 97.8 F (36.6 C)  SpO2: 99%   Filed Weights   02/05/24 0833  Weight: 156 lb 9.6 oz (71 kg)    Physical Exam BREAST: Palpable dip and bump in the left breast.  (exam  performed in the presence of a chaperone)  LABORATORY DATA:  I have reviewed the data as listed    Latest Ref Rng & Units 11/13/2023   11:17 AM 10/01/2023    3:01 PM 09/04/2023    8:09 AM  CMP  Glucose 65 - 99 mg/dL 79  84  98   BUN 7 - 25 mg/dL 19  16  18    Creatinine 0.60 - 1.00 mg/dL 6.04  5.40  9.81   Sodium 135 - 146 mmol/L 140  139  139   Potassium 3.5 - 5.3 mmol/L 3.9  3.8  3.7   Chloride 98 - 110 mmol/L 100  101  102   CO2 20 - 32 mmol/L 32  29  28   Calcium 8.6 - 10.4 mg/dL 19.1  9.9  9.4   Total Protein 6.1 - 8.1 g/dL 7.1  7.2  7.4   Total Bilirubin 0.2 - 1.2 mg/dL 0.6  0.7  0.6   Alkaline Phos 38 - 126 U/L   86   AST 10 - 35 U/L 22  22  31    ALT 6 - 29 U/L 18  15  27       Lab Results  Component Value Date   WBC 6.8 11/13/2023   HGB 14.7 11/13/2023   HCT 41.1 11/13/2023   MCV 88.2 11/13/2023   PLT 243 11/13/2023   NEUTROABS 4,230 11/13/2023    ASSESSMENT & PLAN:  Ductal carcinoma in situ (DCIS) of left breast 12/05/2019: Left lumpectomy by Dr. Ezzard Standing: Intermediate grade DCIS, 0.3 cm, margins clear, ER 100%, PR 90% Staging: Tis NX stage 0   Treatment plan: 1.  Adjuvant radiation therapy at [facility] completed 02/03/20 2.  Adjuvant antiestrogen therapy with tamoxifen (started 5 mg now taking 5 mg every other day) Discontinued it (dizziness, fogginess and confusion and fatigue  ---------------------------------------------------------------------------------------------------------------------------------------------------  Breast Cancer Surveillance: 1. Breast Exam: 02/05/2024 benign 2. Mammogram: 11/24/2022: Benign Density Cat B at [facility]   DEXA scan 11/24/2022 at [facility]: T-score -1.4: Osteopenia recommend calcium and vitamin D   RTC in 1 year ------------------------------------- Assessment and Plan Assessment & Plan Ductal carcinoma in situ (DCIS) of left breast DCIS diagnosed over four years ago. Reports tenderness and palpable area in left breast, likely benign. Further evaluation needed due to history and inability to continue tamoxifen. - Order ultrasound of left breast at [facility] to evaluate palpable area.  Follow-up Overall well with no significant new concerns apart from breast issue. - Schedule follow-up appointment in one year unless new concerns arise.      Orders Placed This Encounter  Procedures   [place] LIMITED ULTRASOUND INCLUDING AXILLA RIGHT BREAST    Standing Status:   Future    Expected Date:   02/19/2024    Expiration Date:   02/04/2025    Reason for Exam (SYMPTOM  OR DIAGNOSIS REQUIRED):   Palpable lump in left breast             [facility]    Preferred Imaging Location?:   External    Release to patient:   Immediate   The  patient has a good understanding of the overall plan. she agrees with it. she will call with any problems that may develop before the next visit here. Total time spent: 30 mins including face to face time and time spent for planning, charting and co-ordination of care   Tamsen Meek, MD 02/05/24

## 2024-02-05 NOTE — Assessment & Plan Note (Signed)
 12/05/2019: Left lumpectomy by Dr. Ezzard Standing: Intermediate grade DCIS, 0.3 cm, margins clear, ER 100%, PR 90% Staging: Tis NX stage 0   Treatment plan: 1.  Adjuvant radiation therapy at [facility] completed 02/03/20 2.  Adjuvant antiestrogen therapy with tamoxifen (started 5 mg now taking 5 mg every other day) Discontinued it (dizziness, fogginess and confusion and fatigue  ---------------------------------------------------------------------------------------------------------------------------------------------------  Breast Cancer Surveillance: 1. Breast Exam: 02/05/2024 benign 2. Mammogram: 11/24/2022: Benign Density Cat B at [facility]   DEXA scan 11/24/2022 at [facility]: T-score -1.4: Osteopenia recommend calcium and vitamin D   RTC in 1 year

## 2024-02-05 NOTE — Addendum Note (Signed)
 Addended by: Sherlyn Lick on: 02/05/2024 08:50 AM   Modules accepted: Orders

## 2024-02-12 DIAGNOSIS — R92323 Mammographic fibroglandular density, bilateral breasts: Secondary | ICD-10-CM | POA: Diagnosis not present

## 2024-02-12 DIAGNOSIS — N6325 Unspecified lump in the left breast, overlapping quadrants: Secondary | ICD-10-CM | POA: Diagnosis not present

## 2024-02-14 ENCOUNTER — Encounter: Payer: Self-pay | Admitting: Hematology and Oncology

## 2024-02-20 ENCOUNTER — Encounter: Payer: Self-pay | Admitting: Internal Medicine

## 2024-02-27 DIAGNOSIS — M1612 Unilateral primary osteoarthritis, left hip: Secondary | ICD-10-CM | POA: Diagnosis not present

## 2024-02-27 DIAGNOSIS — M7062 Trochanteric bursitis, left hip: Secondary | ICD-10-CM | POA: Diagnosis not present

## 2024-02-27 DIAGNOSIS — M7061 Trochanteric bursitis, right hip: Secondary | ICD-10-CM | POA: Diagnosis not present

## 2024-03-03 DIAGNOSIS — H524 Presbyopia: Secondary | ICD-10-CM | POA: Diagnosis not present

## 2024-03-03 DIAGNOSIS — H2513 Age-related nuclear cataract, bilateral: Secondary | ICD-10-CM | POA: Diagnosis not present

## 2024-03-03 DIAGNOSIS — H16223 Keratoconjunctivitis sicca, not specified as Sjogren's, bilateral: Secondary | ICD-10-CM | POA: Diagnosis not present

## 2024-03-03 DIAGNOSIS — H43813 Vitreous degeneration, bilateral: Secondary | ICD-10-CM | POA: Diagnosis not present

## 2024-03-03 DIAGNOSIS — H40033 Anatomical narrow angle, bilateral: Secondary | ICD-10-CM | POA: Diagnosis not present

## 2024-04-01 ENCOUNTER — Encounter: Payer: PPO | Admitting: Internal Medicine

## 2024-04-04 ENCOUNTER — Ambulatory Visit (INDEPENDENT_AMBULATORY_CARE_PROVIDER_SITE_OTHER)

## 2024-04-04 VITALS — BP 120/74 | HR 88 | Temp 98.5°F | Ht 66.5 in | Wt 155.0 lb

## 2024-04-04 DIAGNOSIS — I8393 Asymptomatic varicose veins of bilateral lower extremities: Secondary | ICD-10-CM | POA: Diagnosis not present

## 2024-04-04 DIAGNOSIS — Z79899 Other long term (current) drug therapy: Secondary | ICD-10-CM

## 2024-04-04 DIAGNOSIS — I10 Essential (primary) hypertension: Secondary | ICD-10-CM | POA: Diagnosis not present

## 2024-04-04 DIAGNOSIS — R7303 Prediabetes: Secondary | ICD-10-CM

## 2024-04-04 DIAGNOSIS — E782 Mixed hyperlipidemia: Secondary | ICD-10-CM

## 2024-04-04 DIAGNOSIS — E673 Hypervitaminosis D: Secondary | ICD-10-CM

## 2024-04-04 DIAGNOSIS — R7309 Other abnormal glucose: Secondary | ICD-10-CM

## 2024-04-04 DIAGNOSIS — M109 Gout, unspecified: Secondary | ICD-10-CM | POA: Diagnosis not present

## 2024-04-04 LAB — VITAMIN B12: Vitamin B-12: 255 pg/mL (ref 211–911)

## 2024-04-04 LAB — CBC WITH DIFFERENTIAL/PLATELET
Basophils Absolute: 0 10*3/uL (ref 0.0–0.1)
Basophils Relative: 0.8 % (ref 0.0–3.0)
Eosinophils Absolute: 0.1 10*3/uL (ref 0.0–0.7)
Eosinophils Relative: 2.5 % (ref 0.0–5.0)
HCT: 43.6 % (ref 36.0–46.0)
Hemoglobin: 14.8 g/dL (ref 12.0–15.0)
Lymphocytes Relative: 33.3 % (ref 12.0–46.0)
Lymphs Abs: 1.6 10*3/uL (ref 0.7–4.0)
MCHC: 34 g/dL (ref 30.0–36.0)
MCV: 88.7 fl (ref 78.0–100.0)
Monocytes Absolute: 0.4 10*3/uL (ref 0.1–1.0)
Monocytes Relative: 8.1 % (ref 3.0–12.0)
Neutro Abs: 2.6 10*3/uL (ref 1.4–7.7)
Neutrophils Relative %: 55.3 % (ref 43.0–77.0)
Platelets: 217 10*3/uL (ref 150.0–400.0)
RBC: 4.92 Mil/uL (ref 3.87–5.11)
RDW: 13.6 % (ref 11.5–15.5)
WBC: 4.7 10*3/uL (ref 4.0–10.5)

## 2024-04-04 LAB — COMPREHENSIVE METABOLIC PANEL WITH GFR
ALT: 16 U/L (ref 0–35)
AST: 22 U/L (ref 0–37)
Albumin: 4.5 g/dL (ref 3.5–5.2)
Alkaline Phosphatase: 74 U/L (ref 39–117)
BUN: 21 mg/dL (ref 6–23)
CO2: 32 meq/L (ref 19–32)
Calcium: 10 mg/dL (ref 8.4–10.5)
Chloride: 101 meq/L (ref 96–112)
Creatinine, Ser: 1.01 mg/dL (ref 0.40–1.20)
GFR: 55.31 mL/min — ABNORMAL LOW (ref >60.00)
Glucose, Bld: 102 mg/dL — ABNORMAL HIGH (ref 70–99)
Potassium: 3.5 meq/L (ref 3.5–5.1)
Sodium: 140 meq/L (ref 135–145)
Total Bilirubin: 0.7 mg/dL (ref 0.2–1.2)
Total Protein: 7.4 g/dL (ref 6.0–8.3)

## 2024-04-04 LAB — LIPID PANEL
Cholesterol: 159 mg/dL (ref 0–200)
HDL: 47.7 mg/dL (ref >39.00)
LDL Cholesterol: 80 mg/dL (ref 0–99)
NonHDL: 111.33
Total CHOL/HDL Ratio: 3
Triglycerides: 155 mg/dL — ABNORMAL HIGH (ref 0.0–149.0)
VLDL: 31 mg/dL (ref 0.0–40.0)

## 2024-04-04 LAB — HEMOGLOBIN A1C: Hgb A1c MFr Bld: 5.8 % (ref 4.6–6.5)

## 2024-04-04 LAB — VITAMIN D 25 HYDROXY (VIT D DEFICIENCY, FRACTURES): VITD: 49.74 ng/mL (ref 30.00–100.00)

## 2024-04-04 NOTE — Assessment & Plan Note (Signed)
 Evident on lab from 03/22/23. Recommend repeat Vitamin D  level today. If within normal range recommend taking daily 1,000 units of vitamin D . Pending result

## 2024-04-04 NOTE — Assessment & Plan Note (Signed)
 Chronic No recent gout attack Stable on Allopurinol  300 mg once a day, continue

## 2024-04-04 NOTE — Assessment & Plan Note (Signed)
 Was on Phentermine  37.5 mg daily for few years.  Has been off for about 3 months, requesting refill today.  Counseled patient on s/e from medication like increased BP, insomnia, only recommended for short course for no more than 12 weeks due to r/o tolorance, dependency etc.  Decision made to discontinue this medication today.

## 2024-04-04 NOTE — Progress Notes (Unsigned)
 New Patient Office Visit   Subjective   Patient ID: [name], female    DOB: 28-Jun-1951  Age: 73 y.o. MRN: [id number]  CC:  Chief Complaint  Patient presents with   Establish Care    HPI [name] presents to establish care, previous provider Dr. Cassondra Cliff who passed away unexpectedly.   She  has a past medical history of Actinic keratosis, Allergy, Anxiety, Arthritis, Basal cell carcinoma (2011), Bell palsy (02/04/2016), Breast cancer (HCC), Carpal tunnel syndrome on both sides (01/23/2017), Colon polyps, Complication of anesthesia, GERD (gastroesophageal reflux disease), Hyperlipidemia, Hypertension (2005), IBS (irritable bowel syndrome), Plantar fasciitis, Pneumonia, PONV (postoperative nausea and vomiting), Pre-diabetes, and Prediabetes.  1) Prediabetes:  - Patient on Metformin  500 mg once a day. She was recommended to take Metformin  BID but did not tolerate this.  - Last A1c from 09/20/23: 5.5%. - Patient believes she can cut down on sweet consumption.  - She walks about 1-1.5 miles, 4 days a week for exercise.  - Mostly eats home cooked meals.   2)  Patient was also prescribed Phentermine  37.5 mg daily by her previous doctor. Patient reports this helped her with weight loss, feeling more energized. She has been off this medication since January 2025. She is worried since being off Phentermine  she may have gained weight. She is also worried this may cause her cholesterol, blood glucose to get worse. Has been on this medication for few years now. Has a h/o HTN.  - Goal weight is 148-150 pounds.  3) Dyslipidemia:  - On Fenofibrate  145 mg once a day and Pravastatin  20 mg, every other day. - Last lipid panel 11/13/23 with LDL of 109.   4) Gout:  - On Allopurinol  300 mg daily.  - Does not recall last gout flare up.  - Does not drink alcohol.  5) Hypertension:  - On Maxzide  37.5-25 mg daily.  - Checks BP at home and reports it has been normal, does not recall home BP  reading.  - She denies leg edema, chest pain.   6) Follows up with [facility] for  R knee pain, left hip pain. Had left trochanteric bursa steroid injection on 02/27/24. Stable.   7) H/O ductal cancer in situ of left breast, 11/17/19. 4 years in remission. Once a year f/u with Dr. Vinay Gudena.   8) Osteopenia:  Evident on DEXA: 11/24/22. Currently not on Vitamin D  and calcium  supplement as she had  elevated vitamin D  in 03/22/23 of 104.   9) Chronic rhinitis: Has seen ENT, Dr. Juel Nutley on 01/07/24, using nasal saline irrigation BID, atorvant nasal spray, BID prn.   Outpatient Encounter Medications as of 04/04/2024  Medication Sig   allopurinol  (ZYLOPRIM ) 300 MG tablet TAKE 1 TABLET BY MOUTH ONCE DAILY FOR  GOUT  PREVENTION   Ascorbic Acid (VITAMIN C) 1000 MG tablet Take 1,000 mg by mouth daily.   Cyanocobalamin (VITAMIN B-12 PO) Take 1 tablet by mouth daily.   fenofibrate  (TRICOR ) 145 MG tablet Take  1 tablet  Daily  for Triglycerides (Blood Fats)                                           /  TAKE                                         BY                                                 MOUTH      ? ? ?  ONCE ?? ? ? DAILY? ? ?   Fexofenadine HCl (ALLEGRA PO) Take by mouth.   metFORMIN  (GLUCOPHAGE -XR) 500 MG 24 hr tablet Take 2 tablets 2 x /day with Meals for Diabetes. (Patient taking differently: Take 500 mg by mouth daily.)   pravastatin  (PRAVACHOL ) 20 MG tablet Take  1 tablet  3 x / week  Mon Wed Fri for Cholesterol                               /                                                                   TAKE                                         BY                                                 MOUTH   pyridOXINE (VITAMIN B-6) 100 MG tablet Take 100 mg by mouth daily.   triamterene -hydrochlorothiazide  (MAXZIDE -25) 37.5-25 MG tablet Take 1/2 to 1 tablet Daily for BP & Fluid Retention   zinc gluconate 50 MG tablet Take 50 mg  by mouth daily.   [DISCONTINUED] phentermine  (ADIPEX-P ) 37.5 MG tablet Take 1/2 to 1 tablet every Morning for Dieting & Weight Loss   [DISCONTINUED] aspirin  EC 81 MG tablet Take 1 tablet (81 mg total) by mouth daily. (Patient not taking: Reported on 04/04/2024)   [DISCONTINUED] Cholecalciferol (VITAMIN D  PO) Take 10,000 Units by mouth daily. (Patient not taking: Reported on 04/04/2024)   [DISCONTINUED] ipratropium (ATROVENT ) 0.06 % nasal spray Place 2 sprays into both nostrils 2 (two) times daily as needed (nasal drainage). (Patient not taking: Reported on 02/05/2024)   No facility-administered encounter medications on file as of 04/04/2024.    Past Surgical History:  Procedure Laterality Date   ABDOMINAL HYSTERECTOMY  11/27/1988   basal cell carcinoma  11/27/2009   nose   BREAST LUMPECTOMY WITH RADIOACTIVE SEED LOCALIZATION Left 12/05/2019   Procedure: LEFT BREAST LUMPECTOMY WITH RADIOACTIVE SEED LOCALIZATION;  Surgeon: Juanita Norlander, MD;  Location: [facility];  Service: General;  Laterality: Left;   ESOPHAGOGASTRODUODENOSCOPY N/A 09/20/2023   Procedure: ESOPHAGOGASTRODUODENOSCOPY (EGD);  Surgeon: Annis Kinder, DO;  Location: [facility];  Service: Gastroenterology;  Laterality: N/A;   HIATAL HERNIA REPAIR N/A 09/20/2023  Procedure: LAPAROSCOPIC REPAIR OF SLIDING HIATAL HERNIA;  Surgeon: Aldean Hummingbird, MD;  Location: [facility];  Service: General;  Laterality: N/A;   MASTECTOMY, PARTIAL Left    SAVORY DILATION  09/20/2023   Procedure: SAVORY DILATION;  Surgeon: Annis Kinder, DO;  Location: [facility];  Service: Gastroenterology;;   TENDON REPAIR Bilateral    plantar fasciitis   TONSILLECTOMY     TRANSORAL INCISIONLESS FUNDOPLICATION N/A 09/20/2023   Procedure: TRANSORAL INCISIONLESS FUNDOPLICATION;  Surgeon: Annis Kinder, DO;  Location: [facility];  Service: Gastroenterology;  Laterality: N/A;    ROS As per HPI    Objective    BP 120/74   Pulse 88   Temp 98.5 F (36.9  C) (Oral)   Ht 5' 6.5" (1.689 m)   Wt 155 lb (70.3 kg)   SpO2 97%   BMI 24.64 kg/m   Physical Exam Constitutional:      Appearance: Normal appearance. She is normal weight.  HENT:     Head: Normocephalic and atraumatic.     Right Ear: Tympanic membrane normal.     Left Ear: Tympanic membrane normal.     Ears:     Comments: Narrow ear canals b/l    Mouth/Throat:     Mouth: Mucous membranes are moist.     Pharynx: No oropharyngeal exudate.  Neck:     Thyroid : No thyroid  mass or thyroid  tenderness.  Cardiovascular:     Rate and Rhythm: Normal rate and regular rhythm.  Pulmonary:     Effort: Pulmonary effort is normal.     Breath sounds: Normal breath sounds.  Abdominal:     General: Bowel sounds are normal.     Palpations: Abdomen is soft.     Tenderness: There is no abdominal tenderness. There is no guarding.  Musculoskeletal:     Cervical back: Neck supple. No rigidity.     Right lower leg: No edema.     Left lower leg: No edema.     Comments: B/L lower extremities: small varicose veins without edema, skin discoloration, ulcerations.   Skin:    General: Skin is warm.  Neurological:     Mental Status: She is alert and oriented to person, place, and time.  Psychiatric:        Mood and Affect: Mood normal.        Behavior: Behavior normal.       Assessment & Plan:  Essential hypertension Assessment & Plan: Chronic. Goal BP <130/80 mmHg BP within goal today Check CMP Continue Triamterene -hctz 37.5-25 mg, once daily, refill to be sent once lab results back.   Orders: -     Comprehensive metabolic panel with GFR -     CBC with Differential/Platelet  Acute gout of foot, unspecified cause, unspecified laterality Assessment & Plan: Chronic No recent gout attack Stable on Allopurinol  300 mg once a day, continue  Orders: -     Comprehensive metabolic panel with GFR  Hyperlipidemia, mixed Assessment & Plan: Chronic On Fenofibrate  145 mg once a day and  Pravastatin  20 mg, every other day Check lipid panel today, continue current treatment   Orders: -     Lipid panel  Medication management Assessment & Plan: Was on Phentermine  37.5 mg daily for few years.  Has been off for about 3 months, requesting refill today.  Counseled patient on s/e from medication like increased BP, insomnia, only recommended for short course for no more than 12 weeks due to r/o tolorance, dependency etc.  Decision  made to discontinue this medication today.   Orders: -     Lipid panel  Prediabetes Assessment & Plan: Chronic. Reviewed patient's previous A1c lab Check A1c today Continue Metformin  500 mg, once a day unless A1c>7%, if A1c above goal recommend Metformin  500 mg BID.  Management pending result.   Orders: -     Hemoglobin A1c -     Vitamin B12  High vitamin D  level Assessment & Plan: Evident on lab from 03/22/23. Recommend repeat Vitamin D  level today. If within normal range recommend taking daily 1,000 units of vitamin D . Pending result  Orders: -     VITAMIN D  25 Hydroxy (Vit-D Deficiency, Fractures)  Asymptomatic varicose veins of bilateral lower extremities Assessment & Plan: Recommend leg elevation when possible, wearing compression stockings. If symptomatic, bothersome will consider vascular surgery referral in the future.    I spent 50 minutes on the day of this face to face encounter reviewing patient's medical history, records, medications, previous PCP, ENT, orthopedic visit, I also reviewed prior relevant surgical and non surgical procedures, recent  labs and imaging studies, counseling on hypertension, prediabetes, hyperlipidemia management, reviewing the assessment and plan with patient, and post visit ordering and reviewing of  diagnostics and therapeutics with patient .    Return in about 3 months (around 07/05/2024) for Chronic visit .   Jacklin Mascot, MD

## 2024-04-04 NOTE — Assessment & Plan Note (Signed)
 Chronic. Goal BP <130/80 mmHg BP within goal today Check CMP Continue Triamterene -hctz 37.5-25 mg, once daily, refill to be sent once lab results back.

## 2024-04-04 NOTE — Assessment & Plan Note (Signed)
 Recommend leg elevation when possible, wearing compression stockings. If symptomatic, bothersome will consider vascular surgery referral in the future.

## 2024-04-04 NOTE — Assessment & Plan Note (Signed)
 Chronic On Fenofibrate  145 mg once a day and Pravastatin  20 mg, every other day Check lipid panel today, continue current treatment

## 2024-04-04 NOTE — Assessment & Plan Note (Signed)
 Chronic. Reviewed patient's previous A1c lab Check A1c today Continue Metformin  500 mg, once a day unless A1c>7%, if A1c above goal recommend Metformin  500 mg BID.  Management pending result.

## 2024-04-06 MED ORDER — ALLOPURINOL 300 MG PO TABS: ORAL_TABLET | ORAL | 3 refills | Status: AC

## 2024-04-06 MED ORDER — METFORMIN HCL ER 500 MG PO TB24
500.0000 mg | ORAL_TABLET | Freq: Every day | ORAL | 3 refills | Status: AC
Start: 2024-04-06 — End: ?

## 2024-04-06 MED ORDER — PRAVASTATIN SODIUM 20 MG PO TABS: ORAL_TABLET | ORAL | 3 refills | Status: AC

## 2024-04-06 MED ORDER — FENOFIBRATE 145 MG PO TABS: ORAL_TABLET | ORAL | 3 refills | Status: AC

## 2024-04-06 MED ORDER — TRIAMTERENE-HCTZ 37.5-25 MG PO TABS
ORAL_TABLET | ORAL | 3 refills | Status: AC
Start: 2024-04-06 — End: ?

## 2024-04-08 NOTE — Telephone Encounter (Signed)
 If patient has further questions, please recommend office visit. I already responded to most recent [employer]. If she has new questions please recommend office visit.  Thank you,  Jacklin Mascot, MD

## 2024-04-14 ENCOUNTER — Ambulatory Visit: Payer: HMO | Admitting: Gastroenterology

## 2024-04-14 ENCOUNTER — Encounter: Payer: Self-pay | Admitting: Gastroenterology

## 2024-04-14 VITALS — BP 114/70 | HR 88 | Ht 65.25 in | Wt 156.1 lb

## 2024-04-14 DIAGNOSIS — K219 Gastro-esophageal reflux disease without esophagitis: Secondary | ICD-10-CM | POA: Diagnosis not present

## 2024-04-14 DIAGNOSIS — K449 Diaphragmatic hernia without obstruction or gangrene: Secondary | ICD-10-CM

## 2024-04-14 DIAGNOSIS — R140 Abdominal distension (gaseous): Secondary | ICD-10-CM | POA: Diagnosis not present

## 2024-04-14 DIAGNOSIS — R0982 Postnasal drip: Secondary | ICD-10-CM | POA: Diagnosis not present

## 2024-04-14 DIAGNOSIS — Z8719 Personal history of other diseases of the digestive system: Secondary | ICD-10-CM | POA: Diagnosis not present

## 2024-04-14 DIAGNOSIS — Z9889 Other specified postprocedural states: Secondary | ICD-10-CM

## 2024-04-14 NOTE — Progress Notes (Signed)
 Chief Complaint:    GERD, bloating, nausea  GI History: 73 year old female with a history of HTN, HLD, prediabetes, vitamin D  deficiency, gout, initially referred to me by Dr. Lavaughn Portland for refractory reflux and discussion of antireflux surgical options.   Reflux symptoms started about 3 years ago.  Started with increased sinus drainage, then developed regurgitation, increased throat clearing, dry cough.  Symptoms occurs throughout the day and nocturnal breakthrough as well.  Has been treated with high-dose PPI (briefly went up to omeprazole  80 mg bid) and added Pepcid  20 mg at nighttime without much relief.  Sleeps with HOB elevated, avoid eating within 3+ hours of bedtime.  Was evaluated by Dr. Donalee Fruits in the ENT Clinic.  CT sinuses were normal, exam unremarkable to include normal indirect laryngoscopy, and felt sxs were predominantly reflux related.    Ultimately, underwent cTIF in 08/2023 as below.     GERD history: -Index symptoms: Nocturnal regurgitation, sinus drainage, dry cough, increased throat clearing, waterbrash. No dysphagia.  -Exacerbating features: Supine/night time -Medications trialed: Omeprazole , Pepcid , Carafate -Current medications: Omeprazole  40 mg daily, Pepcid  20 mg daily -Complications: Hiatal hernia   GERD evaluation: - Last EGD: 08/2021 - Barium esophagram: 08/22/2021: Severe gastroesophageal reflux extending to the cervical esophagus.  Otherwise normal - Esophageal Manometry: None - pH/Impedance: None - Bravo: None - GES: 04/06/2023: Normal   Endoscopic History: - 09/21/2021: EGD: Small hiatal hernia, normal stomach and duodenum - 08/06/2023: EGD: Normal esophagus, 1-2 cm sliding hiatal hernia with Hill grade 3 valve.  Normal stomach and duodenum - 09/20/2023: Concomitant laparoscopic hiatal hernia repair and TIF   - 12/19/2021: Colonoscopy: Hemorrhoids, diverticulosis, small adenoma in the sigmoid colon.  Repeat in 5 years.  HPI:     Patient is a 73 y.o.  female presenting to the Gastroenterology Clinic for follow-up.  Was last seen by me on 10/09/2023 for postoperative follow-up after cTIF on 09/20/2023 as above.  She has since titrated off all acid suppression therapy.  Has been sleeping flat and through the night without issue.  She does continue to have postnasal drip and was referred to Dr.Teoh at ENT, seen on 01/07/2024.  Nasal endoscopy performed and overall reassuring, diagnosed with chronic rhinitis with nasal mucosal congestion and turbinate hypertrophy with chronic postnasal drainage and prescribed Atrovent  nasal spray and nasal saline irrigation daily with consideration for referral to rhinologist for cryotherapy ablation if continued symptoms.  Main issue today is post prandial bloating and gas. Describes a "sloshing noise" after she eats/drinks anything. Has been present for the last 2 months. Typically lasts 1-2 hours after eating. Sxs less pronounced with smaller meals. Worse with heavy food. Rare nausea, no emesis.  Symptoms tend to resolve shortly after BM.  No reflux sxs. No heartburn, regurgitation. No dysphagia.   Reviewed labs from earlier this month: Normal CBC, CMP, A1c, vitamin D .  B12 was 255 and started on B12 supplement.  Review of systems:     No chest pain, no SOB, no fevers, no urinary sx   Past Medical History:  Diagnosis Date   Actinic keratosis    Allergy    Anxiety    Arthritis    Basal cell carcinoma 2011   BCC nose   Bell palsy 02/04/2016   Left   Breast cancer (HCC)    Carpal tunnel syndrome on both sides 01/23/2017   Colon polyps    Complication of anesthesia    GERD (gastroesophageal reflux disease)    Hyperlipidemia    Hypertension 2005  IBS (irritable bowel syndrome)    Plantar fasciitis    Pneumonia    PONV (postoperative nausea and vomiting)    Pre-diabetes    Prediabetes     Patient's surgical history, family medical history, social history, medications and allergies were all reviewed  in Epic    Current Outpatient Medications  Medication Sig Dispense Refill   allopurinol  (ZYLOPRIM ) 300 MG tablet TAKE 1 TABLET BY MOUTH ONCE DAILY FOR  GOUT  PREVENTION 90 tablet 3   Ascorbic Acid (VITAMIN C) 1000 MG tablet Take 1,000 mg by mouth daily.     Cyanocobalamin  (VITAMIN B-12 PO) Take 1,000 mcg by mouth daily.     fenofibrate  (TRICOR ) 145 MG tablet Take  1 tablet  Daily  for Triglycerides (Blood Fats)                                           /                                                                   TAKE                                         BY                                                 MOUTH      ? ? ?  ONCE ?? ? ? DAILY? ? ? 90 tablet 3   metFORMIN  (GLUCOPHAGE -XR) 500 MG 24 hr tablet Take 1 tablet (500 mg total) by mouth daily. 90 tablet 3   pravastatin  (PRAVACHOL ) 20 MG tablet Take  1 tablet  3 x / week  Mon Wed Fri for Cholesterol                               /                                                                   TAKE                                         BY                                                 MOUTH 45 tablet 3   pyridOXINE (VITAMIN B-6) 100 MG tablet Take 100 mg by mouth daily.  triamterene -hydrochlorothiazide  (MAXZIDE -25) 37.5-25 MG tablet Take 1/2 to 1 tablet Daily for BP & Fluid Retention 90 tablet 3   zinc gluconate 50 MG tablet Take 50 mg by mouth daily.     No current facility-administered medications for this visit.    Physical Exam:     BP 114/70 (BP Location: Left Arm, Patient Position: Sitting, Cuff Size: Normal)   Pulse 88   Ht 5' 5.25" (1.657 m)   Wt 156 lb 2 oz (70.8 kg)   BMI 25.78 kg/m   GENERAL:  Pleasant female in NAD PSYCH: : Cooperative, normal affect EENT:  conjunctiva pink, mucous membranes moist, neck supple without masses CARDIAC:  RRR, no murmur heard, no peripheral edema PULM: Normal respiratory effort, lungs CTA bilaterally, no wheezing ABDOMEN:  Nondistended, soft, nontender. No obvious masses,  no hepatomegaly,  normal bowel sounds SKIN:  turgor, no lesions seen Musculoskeletal:  Normal muscle tone, normal strength NEURO: Alert and oriented x 3, no focal neurologic deficits   IMPRESSION and PLAN:    1) GERD- s/p TIF 2) Hiatal hernia s/p repair 3) Fundoplication 73 year old female with longstanding history of GERD c/b hiatal hernia, now s/p concomitant laparoscopic hiatal hernia pair and TIF on 09/20/2023.  Doing very well since surgery with resolution of reflux symptoms.  No longer taking any acid suppression therapy.  Tolerating p.o. intake without issue.  4) Postnasal drip No appreciable improvement in PND despite successful antireflux surgery with resolution of typical reflux symptoms.  Encouraged her to follow-up with the ENT clinic.  5) Abdominal bloating Postprandial bloat over the last 2 months or so without associated change in bowel habits.  Possibly some overlapping IBS.  Otherwise no emesis, early satiety, poor p.o. intake to suggest postoperative gastroparesis.  A bit delayed to be postoperative SIBO and symptoms not entirely reflective of SIBO.  At this time, recommend conservative measures with observation, diet diary.  No role for GES (was normal preoperatively), CT, EGD, SIBO testing at this time.    I spent 32 minutes of time, including in depth chart review, independent review of results as outlined above, communicating results with the patient directly, face-to-face time with the patient, coordinating care, and ordering studies and medications as appropriate, and documentation.       Annis Kinder ,DO, FACG 04/14/2024, 9:14 AM

## 2024-04-14 NOTE — Patient Instructions (Signed)
 _______________________________________________________  If your blood pressure at your visit was 140/90 or greater, please contact your primary care physician to follow up on this. _______________________________________________________  If you are age 73 or older, your body mass index should be between 23-30. Your Body mass index is 25.78 kg/m. If this is out of the aforementioned range listed, please consider follow up with your Primary Care Provider. ________________________________________________________  The [facility] providers would like to encourage you to use MYCHART to communicate with providers for non-urgent requests or questions.  Due to long hold times on the telephone, sending your provider a message by [facility] may be a faster and more efficient way to get a response.  Please allow 48 business hours for a response.  Please remember that this is for non-urgent requests.  _______________________________________________________  Please purchase the following medications over the counter and take as directed:  START: Benefiber daily  It was a pleasure to see you today!  Vito Cirigliano, D.O.

## 2024-05-08 ENCOUNTER — Telehealth: Payer: Self-pay

## 2024-05-08 ENCOUNTER — Ambulatory Visit: Payer: Self-pay

## 2024-05-08 ENCOUNTER — Ambulatory Visit
Admission: EM | Admit: 2024-05-08 | Discharge: 2024-05-08 | Disposition: A | Discharge status: Home / Self Care | Attending: Emergency Medicine | Admitting: Emergency Medicine

## 2024-05-08 DIAGNOSIS — R350 Frequency of micturition: Secondary | ICD-10-CM | POA: Insufficient documentation

## 2024-05-08 LAB — POCT URINALYSIS DIP (MANUAL ENTRY)
Bilirubin, UA: NEGATIVE
Glucose, UA: 100 mg/dL — AB
Ketones, POC UA: NEGATIVE mg/dL
Nitrite, UA: POSITIVE — AB
Protein Ur, POC: NEGATIVE mg/dL
Spec Grav, UA: 1.025 (ref 1.010–1.025)
Urobilinogen, UA: 1 U/dL
pH, UA: 5 (ref 5.0–8.0)

## 2024-05-08 MED ORDER — CEPHALEXIN 500 MG PO CAPS: 500.0000 mg | ORAL_CAPSULE | Freq: Four times a day (QID) | ORAL | 0 refills | Status: DC

## 2024-05-08 NOTE — ED Triage Notes (Signed)
 Urinary frequency, lower abdominal pressure, urinary urgency, burning with urination x 2 days. Taking AZO.

## 2024-05-08 NOTE — Telephone Encounter (Signed)
 Spoke with patient to notify her that she would need an appointment before being treatment with an antibiotics. Patient states she was informed that by representative that she would get a call back and treated for the UTI symptoms. Patient was offer at [facility] office and Urgent Care appointment across the parking lot, as we did not have any openings at our practice. Patient declined and was upset in the misleading information she received. Patient says she will just got to Urgent Care and wait because she needed to be treated today. Patient would need an appointment to give sample and see what symptoms she is experiencing since she has not been seen in office since 04/04/24.

## 2024-05-08 NOTE — Telephone Encounter (Signed)
 Duplicate, see other NT encounter for details.   Copied from CRM [id number]. Topic: Clinical - Medical Advice >> May 08, 2024  9:15 AM Alyse July wrote: Reason for CRM: Patient believes she has a UTI and would like to know if she can come in to provide a urine sample or if medication can be call into the pharmacy. [facility] [address], [place] - [address]   CB# [phone] This encounter was created in error - please disregard.

## 2024-05-08 NOTE — ED Provider Notes (Signed)
 Arlander Bellman    CSN: [id number] Arrival date & time: 05/08/24  1709      History   Chief Complaint Chief Complaint  Patient presents with   Urinary Frequency    HPI [name] is a 73 y.o. female.   Presents for evaluation of urinary frequency, dysuria and lower abdominal pressure beginning 2 days ago.  Progressively worsening, endorses working in the yard causing her to go long periods without urinating and drinking water, believes related, endorses occurs at least once a year during the summer.  Has attempted use of Azo which was somewhat helpful.  Denies hematuria, flank pain, fever or vaginal symptoms.  Past Medical History:  Diagnosis Date   Actinic keratosis    Allergy    Anxiety    Arthritis    Basal cell carcinoma 2011   BCC nose   Bell palsy 02/04/2016   Left   Breast cancer (HCC)    Carpal tunnel syndrome on both sides 01/23/2017   Colon polyps    Complication of anesthesia    GERD (gastroesophageal reflux disease)    Hyperlipidemia    Hypertension 2005   IBS (irritable bowel syndrome)    Plantar fasciitis    Pneumonia    PONV (postoperative nausea and vomiting)    Pre-diabetes    Prediabetes     Patient Active Problem List   Diagnosis Date Noted   Asymptomatic varicose veins of bilateral lower extremities 04/04/2024   High vitamin D  level 04/04/2024   Medication management 04/04/2024   Hyperlipidemia, mixed 04/04/2024   Postnasal drip 01/07/2024   Chronic rhinitis 01/07/2024   Hypertrophy of nasal turbinates 01/07/2024   Gastroesophageal reflux disease without esophagitis 09/20/2023   Hiatal hernia 09/20/2023   Hiatal hernia with GERD 09/20/2023   Ductal carcinoma in situ (DCIS) of left breast 11/17/2019   Family history of cardiac disorder in mother 05/26/2019   Fatty liver (2005 by U/S) 09/23/2017   Prediabetes 05/19/2016   BMI 23.0-23.9, adult 05/19/2016   Acute gout of foot 05/19/2016   History of Bell's palsy 02/04/2016    Environmental allergies    Anxiety    Essential hypertension 11/10/2013    Past Surgical History:  Procedure Laterality Date   ABDOMINAL HYSTERECTOMY  11/27/1988   basal cell carcinoma  11/27/2009   nose   BREAST LUMPECTOMY WITH RADIOACTIVE SEED LOCALIZATION Left 12/05/2019   Procedure: LEFT BREAST LUMPECTOMY WITH RADIOACTIVE SEED LOCALIZATION;  Surgeon: Juanita Norlander, MD;  Location: [facility];  Service: General;  Laterality: Left;   ESOPHAGOGASTRODUODENOSCOPY N/A 09/20/2023   Procedure: ESOPHAGOGASTRODUODENOSCOPY (EGD);  Surgeon: Annis Kinder, DO;  Location: [facility];  Service: Gastroenterology;  Laterality: N/A;   HIATAL HERNIA REPAIR N/A 09/20/2023   Procedure: LAPAROSCOPIC REPAIR OF SLIDING HIATAL HERNIA;  Surgeon: Aldean Hummingbird, MD;  Location: [facility];  Service: General;  Laterality: N/A;   MASTECTOMY, PARTIAL Left    SAVORY DILATION  09/20/2023   Procedure: SAVORY DILATION;  Surgeon: Annis Kinder, DO;  Location: [facility];  Service: Gastroenterology;;   TENDON REPAIR Bilateral    plantar fasciitis   TONSILLECTOMY     TRANSORAL INCISIONLESS FUNDOPLICATION N/A 09/20/2023   Procedure: TRANSORAL INCISIONLESS FUNDOPLICATION;  Surgeon: Annis Kinder, DO;  Location: [facility];  Service: Gastroenterology;  Laterality: N/A;    OB History   No obstetric history on file.      Home Medications    Prior to Admission medications   Medication Sig Start Date End  Date Taking? Authorizing Provider  allopurinol  (ZYLOPRIM ) 300 MG tablet TAKE 1 TABLET BY MOUTH ONCE DAILY FOR  GOUT  PREVENTION 04/06/24  Yes Bair, Kalpana, MD  Ascorbic Acid (VITAMIN C) 1000 MG tablet Take 1,000 mg by mouth daily.   Yes [provider]  cephALEXin  (KEFLEX ) 500 MG capsule Take 1 capsule (500 mg total) by mouth 4 (four) times daily. 05/08/24  Yes Adraine Biffle R, NP  Cyanocobalamin  (VITAMIN B-12 PO) Take 1,000 mcg by mouth daily.   Yes [provider]  fenofibrate  (TRICOR )  145 MG tablet Take  1 tablet  Daily  for Triglycerides (Blood Fats)                                           /                                                                   TAKE                                         BY                                                 MOUTH      ? ? ?  ONCE ?? ? ? DAILY? ? ? 04/06/24  Yes Bair, Kalpana, MD  metFORMIN  (GLUCOPHAGE -XR) 500 MG 24 hr tablet Take 1 tablet (500 mg total) by mouth daily. 04/06/24  Yes Bair, Randa Burton, MD  pravastatin  (PRAVACHOL ) 20 MG tablet Take  1 tablet  3 x / week  Mon Wed Fri for Cholesterol                               /                                                                   TAKE                                         BY                                                 MOUTH 04/06/24  Yes Bair, Kalpana, MD  pyridOXINE (VITAMIN B-6) 100 MG tablet Take 100 mg by mouth daily.   Yes [provider]  triamterene -hydrochlorothiazide  (MAXZIDE -25) 37.5-25 MG tablet Take 1/2 to 1 tablet Daily for  BP & Fluid Retention 04/06/24  Yes Bair, Kalpana, MD  zinc gluconate 50 MG tablet Take 50 mg by mouth daily.   Yes [provider]    Family History Family History  Problem Relation Age of Onset   Hypertension Mother    Heart attack Mother    Hyperlipidemia Mother    Heart disease Mother 48       fatal MI   Stroke Mother    Arthritis Mother        rheumatoid   Cancer Father        lung   Hypertension Brother    Cancer Brother        throat, leukemia   Parkinson's disease Brother     Social History Social History   Tobacco Use   Smoking status: Never   Smokeless tobacco: Never  Vaping Use   Vaping status: Never Used  Substance Use Topics   Alcohol use: Yes    Comment: rare   Drug use: Never     Allergies   Dexamethasone    Review of Systems Review of Systems  Genitourinary:  Positive for frequency.     Physical Exam Triage Vital Signs ED Triage Vitals  Encounter Vitals Group     BP  05/08/24 1720 (!) 152/92     Girls Systolic BP Percentile --      Girls Diastolic BP Percentile --      Boys Systolic BP Percentile --      Boys Diastolic BP Percentile --      Pulse Rate 05/08/24 1720 90     Resp 05/08/24 1720 18     Temp 05/08/24 1720 (!) 97.5 F (36.4 C)     Temp Source 05/08/24 1720 Oral     SpO2 05/08/24 1720 98 %     Weight --      Height --      Head Circumference --      Peak Flow --      Pain Score 05/08/24 1722 0     Pain Loc --      Pain Education --      Exclude from Growth Chart --    No data found.  Updated Vital Signs BP (!) 152/92 (BP Location: Left Arm)   Pulse 90   Temp (!) 97.5 F (36.4 C) (Oral)   Resp 18   SpO2 98%   Visual Acuity Right Eye Distance:   Left Eye Distance:   Bilateral Distance:    Right Eye Near:   Left Eye Near:    Bilateral Near:     Physical Exam Constitutional:      Appearance: Normal appearance.   Eyes:     Extraocular Movements: Extraocular movements intact.   Pulmonary:     Effort: Pulmonary effort is normal.  Abdominal:     Tenderness: There is abdominal tenderness in the suprapubic area. There is no right CVA tenderness or left CVA tenderness.   Neurological:     Mental Status: She is alert.      UC Treatments / Results  Labs (all labs ordered are listed, but only abnormal results are displayed) Labs Reviewed  POCT URINALYSIS DIP (MANUAL ENTRY) - Abnormal; Notable for the following components:      Result Value   Color, UA orange (*)    Glucose, UA =100 (*)    Blood, UA trace-intact (*)    Nitrite, UA Positive (*)    Leukocytes, UA Trace (*)    All  other components within normal limits  URINE CULTURE    EKG   Radiology No results found.  Procedures Procedures (including critical care time)  Medications Ordered in UC Medications - No data to display  Initial Impression / Assessment and Plan / UC Course  I have reviewed the triage vital signs and the nursing  notes.  Pertinent labs & imaging results that were available during my care of the patient were reviewed by me and considered in my medical decision making (see chart for details).  Urinary frequency  Urinalysis shows leukocytes and nitrates, sent for culture, discussed findings, placed on cephalexin  for treatment recommended continued use of Azo for supportive care and recommended additional supportive measures with follow-up with urgent care or PCP as needed Final Clinical Impressions(s) / UC Diagnoses   Final diagnoses:  Urinary frequency     Discharge Instructions      Your urinalysis shows Bretton Tandy blood cells and nitrates which are indicative of infection, your urine will be sent to the lab to determine exactly which bacteria is present, if any changes need to be made to your medications you will be notified  Begin use of cephalexin  every 6 hours for 5 days  You may use over-the-counter Azo to help minimize your symptoms until antibiotic removes bacteria, this medication will turn your urine orange  Increase your fluid intake through use of water  As always practice good hygiene, wiping front to back and avoidance of scented vaginal products to prevent further irritation  If symptoms continue to persist after use of medication or recur please follow-up with urgent care or your primary doctor as needed    ED Prescriptions     Medication Sig Dispense Auth. Provider   cephALEXin  (KEFLEX ) 500 MG capsule Take 1 capsule (500 mg total) by mouth 4 (four) times daily. 20 capsule Tanya Ha R, NP      PDMP not reviewed this encounter.   Tanya Canning, NP 05/08/24 1745

## 2024-05-08 NOTE — Discharge Instructions (Addendum)
 Your urinalysis shows [name] blood cells and nitrates which are indicative of infection, your urine will be sent to the lab to determine exactly which bacteria is present, if any changes need to be made to your medications you will be notified  Begin use of cephalexin  every 6 hours for 5 days  You may use over-the-counter Azo to help minimize your symptoms until antibiotic removes bacteria, this medication will turn your urine orange  Increase your fluid intake through use of water  As always practice good hygiene, wiping front to back and avoidance of scented vaginal products to prevent further irritation  If symptoms continue to persist after use of medication or recur please follow-up with urgent care or your primary doctor as needed

## 2024-05-08 NOTE — Telephone Encounter (Signed)
  FYI Only or Action Required?: Action required by provider  Patient was last seen in primary care on 04/04/2024 by Bair, Kalpana, MD. Called Nurse Triage reporting Urinary Frequency. Symptoms began yesterday. Interventions attempted: OTC medications: Azo. Symptoms are: unchanged.  Triage Disposition: See Physician Within 24 Hours  Patient/caregiver understands and will follow disposition?:                      Summary: UTI   Copied From CRM #[id number]. Reason for Triage: UTI no mention of pain just discomfort. Starting taking AZO and would like to know if she can come in to provide a urine sample or if medication can be call into the pharmacy. [facility] [address] [place], [place] - [address]     Reason for Disposition  Urinating more frequently than usual (i.e., frequency)  Answer Assessment - Initial Assessment Questions 1. SYMPTOM: What's the main symptom you're concerned about? (e.g., frequency, incontinence)     Frequency and discomfort. 2. ONSET: When did the symptoms  start?     X 1 day  3. PAIN: Is there any pain? If Yes, ask: How bad is it? (Scale: 1-10; mild, moderate, severe)     No pain, weird sensation 4. CAUSE: What do you think is causing the symptoms?     UTI chronic 5. OTHER SYMPTOMS: Do you have any other symptoms? (e.g., blood in urine, fever, flank pain, pain with urination)     No   Patient is AZO OTC. She reports she experiences these sympotms around this time of year.  Appointment was offered, however there are no available appointments with any provider in the office. Patient would like a prescription called in or wants to come in and provide a urine sample. Pt. Does not wish to be seen in UC.  Protocols used: Urinary Symptoms-A-AH

## 2024-05-08 NOTE — Telephone Encounter (Signed)
 Copied from CRM [id number]. Topic: General - Other >> May 08, 2024  1:15 PM Howard Macho wrote: Reason for CRM: patient called stating she is checking on getting a UTI medication and no one has called her back. Patient states she can come right now and leave a specimen because she would hate to go to the urgent care CB [phone]

## 2024-05-09 ENCOUNTER — Ambulatory Visit: Payer: Self-pay

## 2024-05-09 LAB — URINE CULTURE: Culture: NO GROWTH

## 2024-05-12 ENCOUNTER — Ambulatory Visit (HOSPITAL_COMMUNITY): Payer: Self-pay

## 2024-05-12 ENCOUNTER — Telehealth: Payer: Self-pay

## 2024-05-12 ENCOUNTER — Telehealth: Payer: Self-pay | Admitting: Nurse Practitioner

## 2024-05-12 DIAGNOSIS — N3090 Cystitis, unspecified without hematuria: Secondary | ICD-10-CM

## 2024-05-12 MED ORDER — PHENAZOPYRIDINE HCL 200 MG PO TABS
200.0000 mg | ORAL_TABLET | Freq: Three times a day (TID) | ORAL | 0 refills | Status: DC | PRN
Start: 2024-05-12 — End: 2024-07-07

## 2024-05-12 NOTE — Telephone Encounter (Signed)
 Patient called regarding urinary symptoms.  She was seen in urgent care on 6/12 for dysuria.  Patient did take Azo.  Point-of-care testing positive and patient was started on Keflex  4 times daily.  Urine culture returned and is negative.  Patient called stating she has had no improvement with the Keflex  and is requesting antibiotic change.  I contacted patient who verified DOB.  Reviewed urine culture results and reinforced that her symptoms are not from a bacterial infection which is why the antibiotic is not working and that switching to a different antibiotic would not be beneficial.  Discussed likely interstitial cystitis.  Will send Rx for Pyridium and have her follow-up with her PCP this week for possible urology referral if symptoms do not improve.  Instructed to drink lots of fluids.  ER precautions reviewed.

## 2024-05-12 NOTE — Telephone Encounter (Signed)
 Pt calls stating prescription that was sent is the same as I was taking over the counter. Provider is notified. Per provider, prescription is higher dose than otc. Pt is notified of this and demonstrates understanding.

## 2024-05-13 ENCOUNTER — Ambulatory Visit: Payer: Self-pay

## 2024-05-15 ENCOUNTER — Ambulatory Visit (INDEPENDENT_AMBULATORY_CARE_PROVIDER_SITE_OTHER)

## 2024-05-15 ENCOUNTER — Other Ambulatory Visit (HOSPITAL_COMMUNITY)
Admission: RE | Admit: 2024-05-15 | Discharge: 2024-05-15 | Disposition: A | Discharge status: Home / Self Care | Source: Ambulatory Visit

## 2024-05-15 VITALS — BP 126/80 | HR 93 | Temp 98.6°F | Ht 66.5 in | Wt 157.8 lb

## 2024-05-15 DIAGNOSIS — G5603 Carpal tunnel syndrome, bilateral upper limbs: Secondary | ICD-10-CM | POA: Insufficient documentation

## 2024-05-15 DIAGNOSIS — N8110 Cystocele, unspecified: Secondary | ICD-10-CM | POA: Diagnosis not present

## 2024-05-15 DIAGNOSIS — R350 Frequency of micturition: Secondary | ICD-10-CM | POA: Insufficient documentation

## 2024-05-15 DIAGNOSIS — N3289 Other specified disorders of bladder: Secondary | ICD-10-CM | POA: Diagnosis not present

## 2024-05-15 HISTORY — DX: Frequency of micturition: R35.0

## 2024-05-15 LAB — URINALYSIS, ROUTINE W REFLEX MICROSCOPIC
Bilirubin Urine: NEGATIVE
Hgb urine dipstick: NEGATIVE
Ketones, ur: NEGATIVE
Leukocytes,Ua: NEGATIVE
Nitrite: NEGATIVE
Specific Gravity, Urine: >=1.03 — AB (ref 1.000–1.030)
Total Protein, Urine: NEGATIVE
Urine Glucose: NEGATIVE
Urobilinogen, UA: 0.2 (ref 0.0–1.0)
pH: 6 (ref 5.0–8.0)

## 2024-05-15 NOTE — Assessment & Plan Note (Signed)
 Discuss pelvic floor exercises (e.g., Kegel exercises) to strengthen pelvic support. Information on how to do kegel exercise at home provided.  Discussed lifestyle modifications to reduce strain on pelvic structures, such as managing constipation.  Consider referral to pelvic floor physical therapy, urology referral if symptoms persist or worsen.

## 2024-05-15 NOTE — Assessment & Plan Note (Signed)
 Plan per urinary frequency plus:  Consider antimuscarinic agent and urology referral if symptoms persists.

## 2024-05-15 NOTE — Progress Notes (Signed)
 Established Patient Office Visit   Subjective  Patient ID: [name], female    DOB: 09-07-1951  Age: 73 y.o. MRN: [id number]  Chief Complaint  Patient presents with   Urinary Frequency    She  has a past medical history of Actinic keratosis, Allergy, Anxiety, Arthritis, Basal cell carcinoma (2011), Bell palsy (02/04/2016), Breast cancer (HCC), Carpal tunnel syndrome on both sides (01/23/2017), Colon polyps, Complication of anesthesia, GERD (gastroesophageal reflux disease), Hyperlipidemia, Hypertension (2005), IBS (irritable bowel syndrome), Plantar fasciitis, Pneumonia, PONV (postoperative nausea and vomiting), Pre-diabetes, and Prediabetes.  HPI 1) UTI follow up:  Was treated for UTI on 05/08/24 with Cephalexin  500 mg q4 times a day for 5 days. Urine culture from 05/08/24 was negative for growth.  Since then she has finished antibiotic course.  She continues to have bladder spasm, which is improved with Pyridium.  Frequency- Has improved.    Urgency- Is still there but has improved.    Hematuria- No   Fever- No   Abd and flank pain- No    Vaginal d/c- No  ROS As per HPI    Objective:     BP 126/80   Pulse 93   Temp 98.6 F (37 C) (Oral)   Ht 5' 6.5 (1.689 m)   Wt 157 lb 12.8 oz (71.6 kg)   SpO2 97%   BMI 25.09 kg/m      05/15/2024    1:15 PM 04/04/2024    9:09 AM 11/13/2023   11:10 AM  Depression screen PHQ 2/9  Decreased Interest 0 0 0  Down, Depressed, Hopeless 0 0 0  PHQ - 2 Score 0 0 0  Altered sleeping 0 0   Tired, decreased energy 0 0   Change in appetite 0 0   Feeling bad or failure about yourself  0 0   Trouble concentrating 0 0   Moving slowly or fidgety/restless 0 0   Suicidal thoughts 0 0   PHQ-9 Score 0 0   Difficult doing work/chores Not difficult at all Not difficult at all       05/15/2024    1:16 PM 04/04/2024    9:09 AM  GAD 7 : Generalized Anxiety Score  Nervous, Anxious, on Edge 0 0  Control/stop worrying 0 0  Worry too much -  different things 0 0  Trouble relaxing 0 0  Restless 0 0  Easily annoyed or irritable 0 0  Afraid - awful might happen 0 0  Total GAD 7 Score 0 0  Anxiety Difficulty Not difficult at all Not difficult at all      05/15/2024    1:15 PM 04/04/2024    9:09 AM 11/13/2023   11:10 AM  Depression screen PHQ 2/9  Decreased Interest 0 0 0  Down, Depressed, Hopeless 0 0 0  PHQ - 2 Score 0 0 0  Altered sleeping 0 0   Tired, decreased energy 0 0   Change in appetite 0 0   Feeling bad or failure about yourself  0 0   Trouble concentrating 0 0   Moving slowly or fidgety/restless 0 0   Suicidal thoughts 0 0   PHQ-9 Score 0 0   Difficult doing work/chores Not difficult at all Not difficult at all       05/15/2024    1:16 PM 04/04/2024    9:09 AM  GAD 7 : Generalized Anxiety Score  Nervous, Anxious, on Edge 0 0  Control/stop worrying 0 0  Worry too  much - different things 0 0  Trouble relaxing 0 0  Restless 0 0  Easily annoyed or irritable 0 0  Afraid - awful might happen 0 0  Total GAD 7 Score 0 0  Anxiety Difficulty Not difficult at all Not difficult at all   SDOH Screenings   Food Insecurity: No Food Insecurity (05/12/2024)  Housing: Low Risk  (05/12/2024)  Transportation Needs: No Transportation Needs (05/12/2024)  Utilities: Not At Risk (09/21/2023)  Alcohol Screen: Low Risk  (05/12/2024)  Depression (PHQ2-9): Low Risk  (05/15/2024)  Financial Resource Strain: Low Risk  (05/12/2024)  Physical Activity: Insufficiently Active (05/12/2024)  Social Connections: Moderately Integrated (05/12/2024)  Stress: No Stress Concern Present (05/12/2024)  Tobacco Use: Low Risk  (05/15/2024)     Physical Exam Exam conducted with a chaperone present.  Constitutional:      Appearance: Normal appearance. She is not ill-appearing.  HENT:     Head: Normocephalic and atraumatic.     Mouth/Throat:     Mouth: Mucous membranes are moist.  Neck:     Thyroid : No thyroid  mass or thyroid  tenderness.    Cardiovascular:     Rate and Rhythm: Normal rate and regular rhythm.  Pulmonary:     Effort: Pulmonary effort is normal.     Breath sounds: Normal breath sounds.  Abdominal:     General: Bowel sounds are normal.     Palpations: Abdomen is soft.     Tenderness: There is no right CVA tenderness, left CVA tenderness, guarding or rebound.  Genitourinary:    Comments: Valsalva maneuver:  mild anterior vaginal wall prolapse is noted, consistent with a mild cystocele. No vaginal discharge.   Musculoskeletal:     Cervical back: Neck supple. No rigidity.     Right lower leg: No edema.     Left lower leg: No edema.   Skin:    General: Skin is warm.   Neurological:     Mental Status: She is alert and oriented to person, place, and time.   Psychiatric:        Mood and Affect: Mood normal.        Behavior: Behavior normal.        No results found for any visits on 05/15/24.  The 10-year ASCVD risk score (Arnett DK, et al., 2019) is: 16.2%     Assessment & Plan:   Urinary frequency Assessment & Plan: Avoiding bladder irritants (caffeine, alcohol, spicy foods).  Behavioral therapies: bladder training (times voiding) bladder-control strategies, pelvic floor muscle training (Kegels) discussed.  Obtain UA, urine culture. Vaginal swab to r/o vaginitis.   Orders: -     Cervicovaginal ancillary only -     Urinalysis, Routine w reflex microscopic -     Urine Culture  Cystocele, unspecified Assessment & Plan: Discuss pelvic floor exercises (e.g., Kegel exercises) to strengthen pelvic support. Information on how to do kegel exercise at home provided.  Discussed lifestyle modifications to reduce strain on pelvic structures, such as managing constipation.  Consider referral to pelvic floor physical therapy, urology referral if symptoms persist or worsen.   Bladder spasm Assessment & Plan: Plan per urinary frequency plus:  Consider antimuscarinic agent and urology referral if  symptoms persists.   I spent 30 minutes on the day of this face-to-face encounter reviewing the patient's concerns, recent urgent care visit notes as well as going through assessment and plan with the patient. Additionally, I spent time post-visit ordering and reviewing diagnostics and therapeutics with the patient.  Return if symptoms worsen or fail to improve.   Jacklin Mascot, MD

## 2024-05-15 NOTE — Patient Instructions (Addendum)
 Avoiding bladder irritants (caffeine, alcohol, spicy foods).  Pelvic floor relaxation and strengthening exercises (e.g., Kegel exercises) may alleviate spasms. Timed voiding and bladder retraining can improve symptoms of urgency and frequency. If your urine and swab results comes back normal I recommend we refer you to urology.  We will reach out to you with results from today's tests.

## 2024-05-15 NOTE — Assessment & Plan Note (Signed)
 Avoiding bladder irritants (caffeine, alcohol, spicy foods).  Behavioral therapies: bladder training (times voiding) bladder-control strategies, pelvic floor muscle training (Kegels) discussed.  Obtain UA, urine culture. Vaginal swab to r/o vaginitis.

## 2024-05-16 ENCOUNTER — Ambulatory Visit: Payer: Self-pay

## 2024-05-16 DIAGNOSIS — R82998 Other abnormal findings in urine: Secondary | ICD-10-CM

## 2024-05-16 LAB — CERVICOVAGINAL ANCILLARY ONLY
Bacterial Vaginitis (gardnerella): NEGATIVE
Comment: NEGATIVE
Comment: NEGATIVE
Trichomonas: NEGATIVE

## 2024-05-16 LAB — URINE CULTURE
MICRO NUMBER:: 16601370
Result:: NO GROWTH
SPECIMEN QUALITY:: ADEQUATE

## 2024-05-19 DIAGNOSIS — R82998 Other abnormal findings in urine: Secondary | ICD-10-CM | POA: Insufficient documentation

## 2024-05-19 NOTE — Telephone Encounter (Signed)
 Noted.  Jacklin Mascot, MD

## 2024-05-19 NOTE — Progress Notes (Signed)
 Please let [name] know her urine culture came back negative for bacterial growth. If patient is still symptomatic and is having urinary symptoms I recommend obtaining CT abdomen/Pelvis to rule out kidney stone. If she is feeling better we can hold off on imaging. If she wants to proceed with it please sign the pended order.   Thank you,  Luke Shade, MD

## 2024-05-28 ENCOUNTER — Ambulatory Visit: Payer: HMO | Admitting: Family Medicine

## 2024-06-25 ENCOUNTER — Telehealth: Payer: Self-pay

## 2024-06-25 DIAGNOSIS — E782 Mixed hyperlipidemia: Secondary | ICD-10-CM

## 2024-06-25 DIAGNOSIS — I10 Essential (primary) hypertension: Secondary | ICD-10-CM

## 2024-06-25 NOTE — Telephone Encounter (Signed)
 Future lab for CMP and fasting lipid panel ordered.   Luke Shade, MD

## 2024-06-25 NOTE — Telephone Encounter (Signed)
 Lab order needed

## 2024-06-26 ENCOUNTER — Ambulatory Visit

## 2024-07-03 ENCOUNTER — Other Ambulatory Visit (INDEPENDENT_AMBULATORY_CARE_PROVIDER_SITE_OTHER)

## 2024-07-03 ENCOUNTER — Ambulatory Visit: Payer: Self-pay

## 2024-07-03 DIAGNOSIS — E782 Mixed hyperlipidemia: Secondary | ICD-10-CM

## 2024-07-03 DIAGNOSIS — I10 Essential (primary) hypertension: Secondary | ICD-10-CM | POA: Diagnosis not present

## 2024-07-03 LAB — LIPID PANEL
Cholesterol: 145 mg/dL (ref 0–200)
HDL: 49.4 mg/dL (ref >39.00)
LDL Cholesterol: 74 mg/dL (ref 0–99)
NonHDL: 96.07
Total CHOL/HDL Ratio: 3
Triglycerides: 109 mg/dL (ref 0.0–149.0)
VLDL: 21.8 mg/dL (ref 0.0–40.0)

## 2024-07-03 LAB — COMPREHENSIVE METABOLIC PANEL WITH GFR
ALT: 23 U/L (ref 0–35)
AST: 35 U/L (ref 0–37)
Albumin: 4.9 g/dL (ref 3.5–5.2)
Alkaline Phosphatase: 64 U/L (ref 39–117)
BUN: 23 mg/dL (ref 6–23)
CO2: 26 meq/L (ref 19–32)
Calcium: 10.2 mg/dL (ref 8.4–10.5)
Chloride: 100 meq/L (ref 96–112)
Creatinine, Ser: 1.09 mg/dL (ref 0.40–1.20)
GFR: 50.38 mL/min — ABNORMAL LOW (ref >60.00)
Glucose, Bld: 115 mg/dL — ABNORMAL HIGH (ref 70–99)
Potassium: 3.9 meq/L (ref 3.5–5.1)
Sodium: 140 meq/L (ref 135–145)
Total Bilirubin: 0.7 mg/dL (ref 0.2–1.2)
Total Protein: 7.8 g/dL (ref 6.0–8.3)

## 2024-07-03 NOTE — Progress Notes (Signed)
 To be discussed during OV on 07/07/24.   Luke Shade, MD

## 2024-07-07 ENCOUNTER — Ambulatory Visit (INDEPENDENT_AMBULATORY_CARE_PROVIDER_SITE_OTHER)

## 2024-07-07 VITALS — BP 120/70 | HR 79 | Temp 98.1°F | Resp 20 | Ht 66.5 in | Wt 158.0 lb

## 2024-07-07 DIAGNOSIS — R7303 Prediabetes: Secondary | ICD-10-CM | POA: Diagnosis not present

## 2024-07-07 DIAGNOSIS — I10 Essential (primary) hypertension: Secondary | ICD-10-CM

## 2024-07-07 DIAGNOSIS — N183 Chronic kidney disease, stage 3 unspecified: Secondary | ICD-10-CM | POA: Insufficient documentation

## 2024-07-07 DIAGNOSIS — E538 Deficiency of other specified B group vitamins: Secondary | ICD-10-CM | POA: Insufficient documentation

## 2024-07-07 DIAGNOSIS — N1831 Chronic kidney disease, stage 3a: Secondary | ICD-10-CM | POA: Insufficient documentation

## 2024-07-07 DIAGNOSIS — Z6825 Body mass index (BMI) 25.0-25.9, adult: Secondary | ICD-10-CM | POA: Diagnosis not present

## 2024-07-07 DIAGNOSIS — E782 Mixed hyperlipidemia: Secondary | ICD-10-CM

## 2024-07-07 NOTE — Assessment & Plan Note (Signed)
 B 12 from 03/2024 on normal but low side. Repeat B 12 in 3 months.

## 2024-07-07 NOTE — Progress Notes (Signed)
 Established Patient Office Visit   Subjective  Patient ID: [name], female    DOB: 05-Feb-1951  Age: 73 y.o. MRN: [id number]  Chief Complaint  Patient presents with   Medical Management of Chronic Issues    3 month follow up    She  has a past medical history of Actinic keratosis, Acute gout of foot (05/19/2016), Allergy, Anxiety (Don't know exact dste), Arthritis, Basal cell carcinoma (2011), Bell palsy (02/04/2016), Breast cancer (HCC), Carpal tunnel syndrome on both sides (01/23/2017), Colon polyps, Complication of anesthesia, GERD (gastroesophageal reflux disease) (3-4 years ago), Hyperlipidemia, Hypertension (2005), IBS (irritable bowel syndrome), Plantar fasciitis, Pneumonia, PONV (postoperative nausea and vomiting), Pre-diabetes, Prediabetes, and Urinary frequency (05/15/2024).  HPI Discussed the use of AI scribe software for clinical note transcription with the patient, who gave verbal consent to proceed.  History of Present Illness [name] is a 73 year old female with prediabetes who presents for follow-up on her glucose levels and kidney function.  She is concerned about her recent lab results, particularly her glucose levels and kidney function. Her hemoglobin A1c was 5.8% in May, which had previously decreased from 6% to 5.5%. She has been actively managing her glucose levels by eliminating sugary drinks and candy from her diet. She identifies as a 'stress eater' and is working on reducing her sugar intake.   She is worried about her kidney function,which showed GFR of 50.38 on CMP from 07/03/24. She is concerned about maintaining her kidney health. She reports daily water intake could be improved.    Weight loss concerns: Her weight in 05/15/24 was 157 lb 12.8 oz. Today's weight is 158 lb.  She has expressed interest in medication to assist with weight loss, as her weight has increased to 158 pounds. She recalls doing well on phentermine  in the past, which helped with  her energy levels and weight management.  She takes Metformin  500 mg XR daily. She is on Allopurinol  300 mg once a day. She is on TMP-hydrochlorothiazide  37.5-25 mg for HTN and takes Pravastatin  20 mg daily for lipid management.   ROS As per HPI    Objective:     BP 120/70   Pulse 79   Temp 98.1 F (36.7 C)   Resp 20   Ht 5' 6.5 (1.689 m)   Wt 158 lb (71.7 kg)   SpO2 98%   BMI 25.12 kg/m      07/07/2024    8:08 AM 05/15/2024    1:15 PM 04/04/2024    9:09 AM  Depression screen PHQ 2/9  Decreased Interest 0 0 0  Down, Depressed, Hopeless 0 0 0  PHQ - 2 Score 0 0 0  Altered sleeping 0 0 0  Tired, decreased energy 0 0 0  Change in appetite 0 0 0  Feeling bad or failure about yourself  0 0 0  Trouble concentrating 0 0 0  Moving slowly or fidgety/restless 0 0 0  Suicidal thoughts 0 0 0  PHQ-9 Score 0 0 0  Difficult doing work/chores Not difficult at all Not difficult at all Not difficult at all      07/07/2024    8:09 AM 05/15/2024    1:16 PM 04/04/2024    9:09 AM  GAD 7 : Generalized Anxiety Score  Nervous, Anxious, on Edge 0 0 0  Control/stop worrying 0 0 0  Worry too much - different things 0 0 0  Trouble relaxing 0 0 0  Restless 0 0 0  Easily annoyed or irritable 0 0 0  Afraid - awful might happen 0 0 0  Total GAD 7 Score 0 0 0  Anxiety Difficulty Not difficult at all Not difficult at all Not difficult at all      07/07/2024    8:08 AM 05/15/2024    1:15 PM 04/04/2024    9:09 AM  Depression screen PHQ 2/9  Decreased Interest 0 0 0  Down, Depressed, Hopeless 0 0 0  PHQ - 2 Score 0 0 0  Altered sleeping 0 0 0  Tired, decreased energy 0 0 0  Change in appetite 0 0 0  Feeling bad or failure about yourself  0 0 0  Trouble concentrating 0 0 0  Moving slowly or fidgety/restless 0 0 0  Suicidal thoughts 0 0 0  PHQ-9 Score 0 0 0  Difficult doing work/chores Not difficult at all Not difficult at all Not difficult at all      07/07/2024    8:09 AM 05/15/2024     1:16 PM 04/04/2024    9:09 AM  GAD 7 : Generalized Anxiety Score  Nervous, Anxious, on Edge 0 0 0  Control/stop worrying 0 0 0  Worry too much - different things 0 0 0  Trouble relaxing 0 0 0  Restless 0 0 0  Easily annoyed or irritable 0 0 0  Afraid - awful might happen 0 0 0  Total GAD 7 Score 0 0 0  Anxiety Difficulty Not difficult at all Not difficult at all Not difficult at all   SDOH Screenings   Food Insecurity: No Food Insecurity (05/12/2024)  Housing: Low Risk  (05/12/2024)  Transportation Needs: No Transportation Needs (05/12/2024)  Utilities: Not At Risk (09/21/2023)  Alcohol Screen: Low Risk  (05/12/2024)  Depression (PHQ2-9): Low Risk  (07/07/2024)  Financial Resource Strain: Low Risk  (05/12/2024)  Physical Activity: Insufficiently Active (05/12/2024)  Social Connections: Moderately Integrated (05/12/2024)  Stress: No Stress Concern Present (05/12/2024)  Tobacco Use: Low Risk  (07/07/2024)     Physical Exam Constitutional:      General: She is not in acute distress.    Appearance: Normal appearance.  HENT:     Head: Normocephalic and atraumatic.     Right Ear: Tympanic membrane normal.     Left Ear: Tympanic membrane normal.     Mouth/Throat:     Mouth: Mucous membranes are moist.  Neck:     Thyroid : No thyroid  mass or thyroid  tenderness.  Cardiovascular:     Rate and Rhythm: Normal rate and regular rhythm.  Pulmonary:     Effort: Pulmonary effort is normal.     Breath sounds: Normal breath sounds.  Abdominal:     General: Bowel sounds are normal.     Palpations: Abdomen is soft.     Tenderness: There is no abdominal tenderness. There is no guarding.  Musculoskeletal:     Cervical back: Neck supple. No rigidity.     Right lower leg: No edema.     Left lower leg: No edema.     Comments: B/L asymptomatic varicose veins noted.   Skin:    General: Skin is warm.  Neurological:     Mental Status: She is alert and oriented to person, place, and time.   Psychiatric:        Mood and Affect: Mood normal.        Behavior: Behavior normal.        No results found for any visits on  07/07/24.  The 10-year ASCVD risk score (Arnett DK, et al., 2019) is: 14.6%    Following lab results discussed:  Component     Latest Ref Rng 07/03/2024  Sodium     135 - 145 mEq/L 140   Potassium     3.5 - 5.1 mEq/L 3.9   Chloride     96 - 112 mEq/L 100   CO2     19 - 32 mEq/L 26   Glucose     70 - 99 mg/dL 884 (H)   BUN     6 - 23 mg/dL 23   Creatinine     9.59 - 1.20 mg/dL 8.90   Total Bilirubin     0.2 - 1.2 mg/dL 0.7   Alkaline Phosphatase     39 - 117 U/L 64   AST     0 - 37 U/L 35   ALT     0 - 35 U/L 23   Total Protein     6.0 - 8.3 g/dL 7.8   Albumin     3.5 - 5.2 g/dL 4.9   GFR     >39.99 mL/min 50.38 (L)   Calcium      8.4 - 10.5 mg/dL 89.7   Cholesterol     0 - 200 mg/dL 854   Triglycerides     0.0 - 149.0 mg/dL 890.9   HDL Cholesterol     >39.00 mg/dL 50.59   VLDL     0.0 - 40.0 mg/dL 78.1   LDL (calc)     0 - 99 mg/dL 74   Total CHOL/HDL Ratio 3   NonHDL 96.07      Assessment & Plan:  Assessment and Plan  Essential hypertension Assessment & Plan: BP within goal, continue MP-hydrochlorothiazide  37.5-25 mg for HTN daily. CMP from 07/03/24 reviewed.   Orders: -     Microalbumin / creatinine urine ratio; Future  Hyperlipidemia, mixed Assessment & Plan: Continue Pravastatin  20 mg daily, reviewed lipid panel from 07/03/24, improved compared to previous labs.   Orders: -     Comprehensive metabolic panel with GFR; Future  Prediabetes Assessment & Plan: Continue Metformin  XR 500 mg daily. Repeat A1c in 3 months, future lab ordered.   Orders: -     Comprehensive metabolic panel with GFR; Future -     Hemoglobin A1c; Future  Low serum vitamin B12 Assessment & Plan:  B 12 from 03/2024 on normal but low side. Repeat B 12 in 3 months.   Orders: -     Vitamin B12; Future  BMI 25.0-25.9,adult Assessment &  Plan: - Patient's ideal goal 148-150 lbs. I counseled the patient that her weight is not concerning to add pharmacological intervention to help with weight loss. I discussed continuing with lifestyle modifications including incorporating moderate intensity exercise for 30 min/day for 5 days or at least 150 min/week with 2 days of strength building.  -  Discussed potential use of medications like phentermine  and GLP-1 agonists, but concerns about side effects and cost noted. Again, emphasized lifestyle changes and calorie deficit. - Advised against starting weight loss medication due to potential side effects and current health status. - Consider 12-week trial of phentermine  15 mg once daily before breakfast for 12 weeks if weight and labs do not improve in 3.5 months.    Stage 3a chronic kidney disease (HCC) Assessment & Plan: Likely hypertensive nephropathy. A1c has been within goal.  Increase water intake to 40-50 ounces per day. Emphasized reducing  daily sodium intake to less than 2 gm/day.  Avoid NSAIDs like ibuprofen, Aleve , Advil. Repeat kidney function, urine microalbumin level in 3 months.      Return in about 3 months (around 10/07/2024) for 3.5 month for chronic f/u, fasting labs 2 days before appointment .   Luke Shade, MD

## 2024-07-07 NOTE — Assessment & Plan Note (Signed)
-   Patient's ideal goal 148-150 lbs. I counseled the patient that her weight is not concerning to add pharmacological intervention to help with weight loss. I discussed continuing with lifestyle modifications including incorporating moderate intensity exercise for 30 min/day for 5 days or at least 150 min/week with 2 days of strength building.  -  Discussed potential use of medications like phentermine  and GLP-1 agonists, but concerns about side effects and cost noted. Again, emphasized lifestyle changes and calorie deficit. - Advised against starting weight loss medication due to potential side effects and current health status. - Consider 12-week trial of phentermine  15 mg once daily before breakfast for 12 weeks if weight and labs do not improve in 3.5 months.

## 2024-07-07 NOTE — Assessment & Plan Note (Signed)
 Likely hypertensive nephropathy. A1c has been within goal.  Increase water intake to 40-50 ounces per day. Emphasized reducing daily sodium intake to less than 2 gm/day.  Avoid NSAIDs like ibuprofen, Aleve , Advil. Repeat kidney function, urine microalbumin level in 3 months.

## 2024-07-07 NOTE — Progress Notes (Signed)
 Established Patient Office Visit   Subjective  Patient ID: [name], female    DOB: 1951-10-04  Age: 73 y.o. MRN: [id number]  Chief Complaint  Patient presents with   Medical Management of Chronic Issues    3 month follow up    She  has a past medical history of Actinic keratosis, Acute gout of foot (05/19/2016), Allergy, Anxiety (Don't know exact dste), Arthritis, Basal cell carcinoma (2011), Bell palsy (02/04/2016), Breast cancer (HCC), Carpal tunnel syndrome on both sides (01/23/2017), Colon polyps, Complication of anesthesia, GERD (gastroesophageal reflux disease) (3-4 years ago), Hyperlipidemia, Hypertension (2005), IBS (irritable bowel syndrome), Plantar fasciitis, Pneumonia, PONV (postoperative nausea and vomiting), Pre-diabetes, Prediabetes, and Urinary frequency (05/15/2024).  HPI Discussed the use of AI scribe software for clinical note transcription with the patient, who gave verbal consent to proceed.  History of Present Illness [name] is a 73 year old female with prediabetes who presents for follow-up on her glucose levels and kidney function.  She is concerned about her recent lab results, particularly her glucose levels and kidney function. Her hemoglobin A1c was 5.8% in May, which had previously decreased from 6% to 5.5%. She has been actively managing her glucose levels by eliminating sugary drinks and candy from her diet. She identifies as a 'stress eater' and is working on reducing her sugar intake.   She is worried about her kidney function,which showed GFR of 50.38 on CMP from 07/03/24. She is concerned about maintaining her kidney health. She reports daily water intake could be improved.    Weight loss concerns: Her weight in 05/15/24 was 157 lb 12.8 oz. Today's weight is 158 lb.  She has expressed interest in medication to assist with weight loss, as her weight has increased to 158 pounds. She recalls doing well on phentermine  in the past, which helped with  her energy levels and weight management.  She takes Metformin  500 mg XR daily. She is on Allopurinol  300 mg once a day. She is on TMP-hydrochlorothiazide  37.5-25 mg for HTN and takes Pravastatin  20 mg daily for lipid management.   ROS As per HPI    Objective:     BP 120/70   Pulse 79   Temp 98.1 F (36.7 C)   Resp 20   Ht 5' 6.5 (1.689 m)   Wt 158 lb (71.7 kg)   SpO2 98%   BMI 25.12 kg/m      07/07/2024    8:08 AM 05/15/2024    1:15 PM 04/04/2024    9:09 AM  Depression screen PHQ 2/9  Decreased Interest 0 0 0  Down, Depressed, Hopeless 0 0 0  PHQ - 2 Score 0 0 0  Altered sleeping 0 0 0  Tired, decreased energy 0 0 0  Change in appetite 0 0 0  Feeling bad or failure about yourself  0 0 0  Trouble concentrating 0 0 0  Moving slowly or fidgety/restless 0 0 0  Suicidal thoughts 0 0 0  PHQ-9 Score 0 0 0  Difficult doing work/chores Not difficult at all Not difficult at all Not difficult at all      07/07/2024    8:09 AM 05/15/2024    1:16 PM 04/04/2024    9:09 AM  GAD 7 : Generalized Anxiety Score  Nervous, Anxious, on Edge 0 0 0  Control/stop worrying 0 0 0  Worry too much - different things 0 0 0  Trouble relaxing 0 0 0  Restless 0 0 0  Easily annoyed or irritable 0 0 0  Afraid - awful might happen 0 0 0  Total GAD 7 Score 0 0 0  Anxiety Difficulty Not difficult at all Not difficult at all Not difficult at all      07/07/2024    8:08 AM 05/15/2024    1:15 PM 04/04/2024    9:09 AM  Depression screen PHQ 2/9  Decreased Interest 0 0 0  Down, Depressed, Hopeless 0 0 0  PHQ - 2 Score 0 0 0  Altered sleeping 0 0 0  Tired, decreased energy 0 0 0  Change in appetite 0 0 0  Feeling bad or failure about yourself  0 0 0  Trouble concentrating 0 0 0  Moving slowly or fidgety/restless 0 0 0  Suicidal thoughts 0 0 0  PHQ-9 Score 0 0 0  Difficult doing work/chores Not difficult at all Not difficult at all Not difficult at all      07/07/2024    8:09 AM 05/15/2024     1:16 PM 04/04/2024    9:09 AM  GAD 7 : Generalized Anxiety Score  Nervous, Anxious, on Edge 0 0 0  Control/stop worrying 0 0 0  Worry too much - different things 0 0 0  Trouble relaxing 0 0 0  Restless 0 0 0  Easily annoyed or irritable 0 0 0  Afraid - awful might happen 0 0 0  Total GAD 7 Score 0 0 0  Anxiety Difficulty Not difficult at all Not difficult at all Not difficult at all   SDOH Screenings   Food Insecurity: No Food Insecurity (05/12/2024)  Housing: Low Risk  (05/12/2024)  Transportation Needs: No Transportation Needs (05/12/2024)  Utilities: Not At Risk (09/21/2023)  Alcohol Screen: Low Risk  (05/12/2024)  Depression (PHQ2-9): Low Risk  (07/07/2024)  Financial Resource Strain: Low Risk  (05/12/2024)  Physical Activity: Insufficiently Active (05/12/2024)  Social Connections: Moderately Integrated (05/12/2024)  Stress: No Stress Concern Present (05/12/2024)  Tobacco Use: Low Risk  (07/07/2024)     Physical Exam Constitutional:      General: She is not in acute distress.    Appearance: Normal appearance.  HENT:     Head: Normocephalic and atraumatic.     Right Ear: Tympanic membrane normal.     Left Ear: Tympanic membrane normal.     Mouth/Throat:     Mouth: Mucous membranes are moist.  Neck:     Thyroid : No thyroid  mass or thyroid  tenderness.  Cardiovascular:     Rate and Rhythm: Normal rate and regular rhythm.  Pulmonary:     Effort: Pulmonary effort is normal.     Breath sounds: Normal breath sounds.  Abdominal:     General: Bowel sounds are normal.     Palpations: Abdomen is soft.     Tenderness: There is no abdominal tenderness. There is no guarding.  Musculoskeletal:     Cervical back: Neck supple. No rigidity.     Right lower leg: No edema.     Left lower leg: No edema.     Comments: B/L asymptomatic varicose veins noted.   Skin:    General: Skin is warm.  Neurological:     Mental Status: She is alert and oriented to person, place, and time.   Psychiatric:        Mood and Affect: Mood normal.        Behavior: Behavior normal.        No results found for any visits on  07/07/24.  The 10-year ASCVD risk score (Arnett DK, et al., 2019) is: 14.6%    Following lab results discussed:  Component     Latest Ref Rng 07/03/2024  Sodium     135 - 145 mEq/L 140   Potassium     3.5 - 5.1 mEq/L 3.9   Chloride     96 - 112 mEq/L 100   CO2     19 - 32 mEq/L 26   Glucose     70 - 99 mg/dL 884 (H)   BUN     6 - 23 mg/dL 23   Creatinine     9.59 - 1.20 mg/dL 8.90   Total Bilirubin     0.2 - 1.2 mg/dL 0.7   Alkaline Phosphatase     39 - 117 U/L 64   AST     0 - 37 U/L 35   ALT     0 - 35 U/L 23   Total Protein     6.0 - 8.3 g/dL 7.8   Albumin     3.5 - 5.2 g/dL 4.9   GFR     >39.99 mL/min 50.38 (L)   Calcium      8.4 - 10.5 mg/dL 89.7   Cholesterol     0 - 200 mg/dL 854   Triglycerides     0.0 - 149.0 mg/dL 890.9   HDL Cholesterol     >39.00 mg/dL 50.59   VLDL     0.0 - 40.0 mg/dL 78.1   LDL (calc)     0 - 99 mg/dL 74   Total CHOL/HDL Ratio 3   NonHDL 96.07      Assessment & Plan:  Assessment and Plan  Essential hypertension Assessment & Plan: BP within goal, continue MP-hydrochlorothiazide  37.5-25 mg for HTN daily. CMP from 07/03/24 reviewed.   Orders: -     Microalbumin / creatinine urine ratio; Future  Hyperlipidemia, mixed Assessment & Plan: Continue Pravastatin  20 mg daily, reviewed lipid panel from 07/03/24, improved compared to previous labs.   Orders: -     Comprehensive metabolic panel with GFR; Future  Prediabetes Assessment & Plan: Continue Metformin  XR 500 mg daily. Repeat A1c in 3 months, future lab ordered.   Orders: -     Comprehensive metabolic panel with GFR; Future -     Hemoglobin A1c; Future  Low serum vitamin B12 Assessment & Plan:  B 12 from 03/2024 on normal but low side. Repeat B 12 in 3 months.   Orders: -     Vitamin B12; Future  BMI 25.0-25.9,adult Assessment &  Plan: - Patient's ideal goal 148-150 lbs. I counseled the patient that her weight is not concerning to add pharmacological intervention to help with weight loss. I discussed continuing with lifestyle modifications including incorporating moderate intensity exercise for 30 min/day for 5 days or at least 150 min/week with 2 days of strength building.  -  Discussed potential use of medications like phentermine  and GLP-1 agonists, but concerns about side effects and cost noted. Again, emphasized lifestyle changes and calorie deficit. - Advised against starting weight loss medication due to potential side effects and current health status. - Consider 12-week trial of phentermine  15 mg once daily before breakfast for 12 weeks if weight and labs do not improve in 3.5 months.    Stage 3a chronic kidney disease (HCC) Assessment & Plan: Likely hypertensive nephropathy. A1c has been within goal.  Increase water intake to 40-50 ounces per day. Emphasized reducing  daily sodium intake to less than 2 gm/day.  Avoid NSAIDs like ibuprofen, Aleve , Advil. Repeat kidney function, urine microalbumin level in 3 months.      Return in about 3 months (around 10/07/2024) for 3.5 month for chronic f/u, fasting labs 2 days before appointment .   Luke Shade, MD

## 2024-07-07 NOTE — Progress Notes (Signed)
 Discussed during office visit.  Luke Shade, MD

## 2024-07-07 NOTE — Assessment & Plan Note (Signed)
 Continue Metformin  XR 500 mg daily. Repeat A1c in 3 months, future lab ordered.

## 2024-07-07 NOTE — Assessment & Plan Note (Signed)
 BP within goal, continue MP-hydrochlorothiazide  37.5-25 mg for HTN daily. CMP from 07/03/24 reviewed.

## 2024-07-07 NOTE — Assessment & Plan Note (Signed)
 Continue Pravastatin  20 mg daily, reviewed lipid panel from 07/03/24, improved compared to previous labs.

## 2024-08-06 ENCOUNTER — Ambulatory Visit: Payer: PPO | Admitting: Dermatology

## 2024-08-30 ENCOUNTER — Ambulatory Visit
Admission: RE | Admit: 2024-08-30 | Discharge: 2024-08-30 | Disposition: A | Discharge status: Home / Self Care | Attending: Emergency Medicine | Admitting: Emergency Medicine

## 2024-08-30 VITALS — BP 126/82 | HR 92 | Temp 97.7°F | Resp 18

## 2024-08-30 DIAGNOSIS — J0100 Acute maxillary sinusitis, unspecified: Secondary | ICD-10-CM

## 2024-08-30 MED ORDER — AMOXICILLIN-POT CLAVULANATE 875-125 MG PO TABS: 1.0000 | ORAL_TABLET | Freq: Two times a day (BID) | ORAL | 0 refills | Status: DC

## 2024-08-30 MED ORDER — PROMETHAZINE-DM 6.25-15 MG/5ML PO SYRP: 5.0000 mL | ORAL_SOLUTION | Freq: Four times a day (QID) | ORAL | 0 refills | Status: DC | PRN

## 2024-08-30 NOTE — ED Triage Notes (Signed)
 Patient to Urgent Care with complaints of green drainage/ mucus/ cough/ facial pain/ nasal congestion.  Symptoms x4 days.   Meds: tylenol  w/ sudafed/ dayquil and nyquil.

## 2024-08-30 NOTE — ED Provider Notes (Signed)
 [name]    CSN: [id number] Arrival date & time: 08/30/24  1318      History   Chief Complaint Chief Complaint  Patient presents with   Nasal Congestion    Symptoms of sinus infection. Greenish drainage. Hurts under and around eyes - Entered by patient    HPI [name] is a 73 y.o. female.  Patient presents with 5-day history of sinus pressure, congestion, postnasal drip, runny nose, sinus pain, cough.  No fever, shortness of breath, chest pain, vomiting, diarrhea.  Multiple OTC cold and sinus medications attempted without relief.  The history is provided by the patient and medical records.    Past Medical History:  Diagnosis Date   Actinic keratosis    Acute gout of foot 05/19/2016   Allergy    Anxiety Don't know exact dste   Arthritis    Basal cell carcinoma 2011   BCC nose   Bell palsy 02/04/2016   Left   Breast cancer (HCC)    Carpal tunnel syndrome on both sides 01/23/2017   Colon polyps    Complication of anesthesia    GERD (gastroesophageal reflux disease) 3-4 years ago   Had surgery October 2024   Hyperlipidemia    Hypertension 2005   On HCTZ dsily   IBS (irritable bowel syndrome)    Plantar fasciitis    Pneumonia    PONV (postoperative nausea and vomiting)    Pre-diabetes    Prediabetes    Urinary frequency 05/15/2024    Patient Active Problem List   Diagnosis Date Noted   Low serum vitamin B12 07/07/2024   Stage 3a chronic kidney disease (HCC) 07/07/2024   Calcium  oxalate crystals in urine 05/19/2024   Carpal tunnel syndrome on both sides 05/15/2024   Cystocele, unspecified 05/15/2024   Bladder spasm 05/15/2024   Asymptomatic varicose veins of bilateral lower extremities 04/04/2024   Medication management 04/04/2024   Hyperlipidemia, mixed 04/04/2024   Postnasal drip 01/07/2024   Chronic rhinitis 01/07/2024   Hypertrophy of nasal turbinates 01/07/2024   Gastroesophageal reflux disease without esophagitis 09/20/2023   Hiatal  hernia 09/20/2023   Hiatal hernia with GERD 09/20/2023   Ductal carcinoma in situ (DCIS) of left breast 11/17/2019   Family history of cardiac disorder in mother 05/26/2019   Degeneration of lumbar intervertebral disc 01/11/2018   Fatty liver (2005 by [facility]) 09/23/2017   Prediabetes 05/19/2016   BMI 25.0-25.9,adult 05/19/2016   History of Bell's palsy 02/04/2016   Carpal tunnel syndrome on right 10/26/2015   Environmental allergies    Anxiety    Essential hypertension 11/10/2013    Past Surgical History:  Procedure Laterality Date   ABDOMINAL HYSTERECTOMY  11/27/1988   basal cell carcinoma  11/27/2009   nose   BREAST LUMPECTOMY WITH RADIOACTIVE SEED LOCALIZATION Left 12/05/2019   Procedure: LEFT BREAST LUMPECTOMY WITH RADIOACTIVE SEED LOCALIZATION;  Surgeon: Ethyl Lenis, MD;  Location: [facility];  Service: General;  Laterality: Left;   BREAST SURGERY  2021   Partial mastectomy   ESOPHAGOGASTRODUODENOSCOPY N/A 09/20/2023   Procedure: ESOPHAGOGASTRODUODENOSCOPY (EGD);  Surgeon: San Sandor GAILS, DO;  Location: [facility];  Service: Gastroenterology;  Laterality: N/A;   HERNIA REPAIR  October 2024   HIATAL HERNIA REPAIR N/A 09/20/2023   Procedure: LAPAROSCOPIC REPAIR OF SLIDING HIATAL HERNIA;  Surgeon: Tanda Locus, MD;  Location: [facility];  Service: General;  Laterality: N/A;   MASTECTOMY, PARTIAL Left    SAVORY DILATION  09/20/2023   Procedure: SAVORY DILATION;  Surgeon: San Sandor GAILS, DO;  Location: [facility];  Service: Gastroenterology;;   TENDON REPAIR Bilateral    plantar fasciitis   TONSILLECTOMY     TRANSORAL INCISIONLESS FUNDOPLICATION N/A 09/20/2023   Procedure: TRANSORAL INCISIONLESS FUNDOPLICATION;  Surgeon: San Sandor GAILS, DO;  Location: [facility];  Service: Gastroenterology;  Laterality: N/A;   TUBAL LIGATION  1999    OB History   No obstetric history on file.      Home Medications    Prior to Admission medications   Medication Sig Start Date  End Date Taking? Authorizing Provider  amoxicillin -clavulanate (AUGMENTIN ) 875-125 MG tablet Take 1 tablet by mouth every 12 (twelve) hours. 08/30/24  Yes Corlis Burnard DEL, NP  promethazine -dextromethorphan (PROMETHAZINE -DM) 6.25-15 MG/5ML syrup Take 5 mLs by mouth 4 (four) times daily as needed. 08/30/24  Yes Corlis Burnard DEL, NP  allopurinol  (ZYLOPRIM ) 300 MG tablet TAKE 1 TABLET BY MOUTH ONCE DAILY FOR  GOUT  PREVENTION 04/06/24   Bair, Kalpana, MD  Ascorbic Acid (VITAMIN C) 1000 MG tablet Take 1,000 mg by mouth daily.    [provider]  Cyanocobalamin  (VITAMIN B-12 PO) Take 1,000 mcg by mouth daily.    [provider]  fenofibrate  (TRICOR ) 145 MG tablet Take  1 tablet  Daily  for Triglycerides (Blood Fats)                                           /                                                                   TAKE                                         BY                                                 MOUTH      ? ? ?  ONCE ?? ? ? DAILY? ? ? 04/06/24   Bair, Kalpana, MD  metFORMIN  (GLUCOPHAGE -XR) 500 MG 24 hr tablet Take 1 tablet (500 mg total) by mouth daily. 04/06/24   Bair, Kalpana, MD  pravastatin  (PRAVACHOL ) 20 MG tablet Take  1 tablet  3 x / week  Mon Wed Fri for Cholesterol                               /                                                                   TAKE  BY                                                 MOUTH 04/06/24   Bair, Kalpana, MD  pyridOXINE (VITAMIN B-6) 100 MG tablet Take 100 mg by mouth daily.    [provider]  triamterene -hydrochlorothiazide  (MAXZIDE -25) 37.5-25 MG tablet Take 1/2 to 1 tablet Daily for BP & Fluid Retention 04/06/24   Bair, Kalpana, MD  zinc gluconate 50 MG tablet Take 50 mg by mouth daily.    [provider]    Family History Family History  Problem Relation Age of Onset   Hypertension Mother    Heart attack Mother    Hyperlipidemia Mother    Heart disease Mother  7       fatal MI   Stroke Mother    Arthritis Mother        rheumatoid   Cancer Father        lung   Arthritis Father    Hypertension Brother    Cancer Brother        throat, leukemia   Parkinson's disease Brother    Arthritis Brother     Social History Social History   Tobacco Use   Smoking status: Never   Smokeless tobacco: Never  Vaping Use   Vaping status: Never Used  Substance Use Topics   Alcohol use: Yes    Comment: rare   Drug use: Never     Allergies   Dexamethasone    Review of Systems Review of Systems  Constitutional:  Negative for chills and fever.  HENT:  Positive for congestion, postnasal drip, rhinorrhea and sinus pressure. Negative for ear pain and sore throat.   Respiratory:  Positive for cough. Negative for shortness of breath.   Cardiovascular:  Negative for chest pain and palpitations.  Gastrointestinal:  Negative for diarrhea and vomiting.     Physical Exam Triage Vital Signs ED Triage Vitals  Encounter Vitals Group     BP 08/30/24 1330 126/82     Girls Systolic BP Percentile --      Girls Diastolic BP Percentile --      Boys Systolic BP Percentile --      Boys Diastolic BP Percentile --      Pulse Rate 08/30/24 1330 92     Resp 08/30/24 1330 18     Temp 08/30/24 1330 97.7 F (36.5 C)     Temp src --      SpO2 08/30/24 1330 95 %     Weight --      Height --      Head Circumference --      Peak Flow --      Pain Score 08/30/24 1332 8     Pain Loc --      Pain Education --      Exclude from Growth Chart --    No data found.  Updated Vital Signs BP 126/82   Pulse 92   Temp 97.7 F (36.5 C)   Resp 18   SpO2 95%   Visual Acuity Right Eye Distance:   Left Eye Distance:   Bilateral Distance:    Right Eye Near:   Left Eye Near:    Bilateral Near:     Physical Exam Constitutional:      General: She is not in acute distress.  HENT:     Right Ear: Tympanic membrane normal.     Left Ear: Tympanic membrane normal.      Nose: Congestion and rhinorrhea present.     Mouth/Throat:     Mouth: Mucous membranes are moist.     Pharynx: Oropharynx is clear.  Cardiovascular:     Rate and Rhythm: Normal rate and regular rhythm.     Heart sounds: Normal heart sounds.  Pulmonary:     Effort: Pulmonary effort is normal. No respiratory distress.     Breath sounds: Normal breath sounds.  Neurological:     Mental Status: She is alert.      UC Treatments / Results  Labs (all labs ordered are listed, but only abnormal results are displayed) Labs Reviewed - No data to display  EKG   Radiology No results found.  Procedures Procedures (including critical care time)  Medications Ordered in UC Medications - No data to display  Initial Impression / Assessment and Plan / UC Course  I have reviewed the triage vital signs and the nursing notes.  Pertinent labs & imaging results that were available during my care of the patient were reviewed by me and considered in my medical decision making (see chart for details).    Acute sinusitis.  Afebrile and vital signs are stable.  Lungs are clear and O2 sat is 95% on room air.  Patient has been symptomatic for 5 days and is not improving despite attempts with multiple OTC cold and sinus medications.  Treating today with Augmentin .  Promethazine  DM also prescribed today as patient reports her symptoms are keeping her awake at night; precautions for drowsiness with this medication discussed.  Education provided on sinus infection.  Instructed patient to follow-up with her PCP if she is not improving.  She agrees to plan of care.  Final Clinical Impressions(s) / UC Diagnoses   Final diagnoses:  Acute non-recurrent maxillary sinusitis     Discharge Instructions      Take the Augmentin  as directed.    Take the promethazine  DM as directed.  Do not drive, operate machinery, drink alcohol, or perform dangerous activities while taking this medication as it may cause  drowsiness.  Follow-up with your primary care provider if your symptoms are not improving.        ED Prescriptions     Medication Sig Dispense Auth. Provider   amoxicillin -clavulanate (AUGMENTIN ) 875-125 MG tablet Take 1 tablet by mouth every 12 (twelve) hours. 14 tablet Corlis Burnard DEL, NP   promethazine -dextromethorphan (PROMETHAZINE -DM) 6.25-15 MG/5ML syrup Take 5 mLs by mouth 4 (four) times daily as needed. 118 mL Corlis Burnard DEL, NP      PDMP not reviewed this encounter.   Corlis Burnard DEL, NP 08/30/24 [phone]

## 2024-08-30 NOTE — Discharge Instructions (Addendum)
 Take the Augmentin  as directed.    Take the promethazine  DM as directed.  Do not drive, operate machinery, drink alcohol, or perform dangerous activities while taking this medication as it may cause drowsiness.  Follow-up with your primary care provider if your symptoms are not improving.

## 2024-09-22 ENCOUNTER — Ambulatory Visit: Admitting: Dermatology

## 2024-09-22 DIAGNOSIS — H00014 Hordeolum externum left upper eyelid: Secondary | ICD-10-CM | POA: Diagnosis not present

## 2024-09-24 ENCOUNTER — Ambulatory Visit: Admitting: Dermatology

## 2024-09-24 DIAGNOSIS — L820 Inflamed seborrheic keratosis: Secondary | ICD-10-CM

## 2024-09-24 DIAGNOSIS — L578 Other skin changes due to chronic exposure to nonionizing radiation: Secondary | ICD-10-CM | POA: Diagnosis not present

## 2024-09-24 DIAGNOSIS — W908XXA Exposure to other nonionizing radiation, initial encounter: Secondary | ICD-10-CM

## 2024-09-24 DIAGNOSIS — L821 Other seborrheic keratosis: Secondary | ICD-10-CM | POA: Diagnosis not present

## 2024-09-24 NOTE — Progress Notes (Signed)
   Follow-Up Visit   Subjective  [name] is a 73 y.o. female who presents for the following: spots at hands and arms.   The following portions of the chart were reviewed this encounter and updated as appropriate: medications, allergies, medical history  Review of Systems:  No other skin or systemic complaints except as noted in HPI or Assessment and Plan.  Objective  Well appearing patient in no apparent distress; mood and affect are within normal limits.  A focused examination was performed of the following areas: Hands, arms  Relevant exam findings are noted in the Assessment and Plan.  hands, arms (30) Erythematous stuck-on, waxy papule or plaque  Assessment & Plan   INFLAMED SEBORRHEIC KERATOSIS (30) hands, arms (30) Symptomatic, irritating, patient would like treated.  Benign-appearing.  Call clinic for new or changing lesions.   Destruction of lesion - hands, arms (30) Complexity: simple   Destruction method: cryotherapy   Informed consent: discussed and consent obtained   Timeout:  patient name, date of birth, surgical site, and procedure verified Lesion destroyed using liquid nitrogen: Yes   Region frozen until ice ball extended beyond lesion: Yes   Outcome: patient tolerated procedure well with no complications   Post-procedure details: wound care instructions given     ACTINIC DAMAGE - chronic, secondary to cumulative UV radiation exposure/sun exposure over time - diffuse scaly erythematous macules with underlying dyspigmentation - Recommend daily broad spectrum sunscreen SPF 30+ to sun-exposed areas, reapply every 2 hours as needed.  - Recommend staying in the shade or wearing long sleeves, sun glasses (UVA+UVB protection) and wide brim hats (4-inch brim around the entire circumference of the hat). - Call for new or changing lesions.  SEBORRHEIC KERATOSIS - Stuck-on, waxy, tan-brown papules and/or plaques  - Benign-appearing - Discussed benign  etiology and prognosis. - Observe - Call for any changes  Return in 6 weeks (on 11/05/2024), or if symptoms worsen or fail to improve, for ISK follow up, with Dr. LOIS LILLETTE Lonell Lorren, RMA, am acting as scribe for Alm Rhyme, MD .   Documentation: I have reviewed the above documentation for accuracy and completeness, and I agree with the above.  Alm Rhyme, MD

## 2024-09-24 NOTE — Patient Instructions (Signed)

## 2024-09-30 ENCOUNTER — Encounter: Payer: Self-pay | Admitting: Dermatology

## 2024-10-06 ENCOUNTER — Ambulatory Visit: Payer: Self-pay

## 2024-10-06 ENCOUNTER — Ambulatory Visit
Admission: EM | Admit: 2024-10-06 | Discharge: 2024-10-06 | Disposition: A | Discharge status: Home / Self Care | Attending: Emergency Medicine | Admitting: Emergency Medicine

## 2024-10-06 DIAGNOSIS — J069 Acute upper respiratory infection, unspecified: Secondary | ICD-10-CM | POA: Diagnosis not present

## 2024-10-06 MED ORDER — ALBUTEROL SULFATE HFA 108 (90 BASE) MCG/ACT IN AERS: 1.0000 | INHALATION_SPRAY | Freq: Four times a day (QID) | RESPIRATORY_TRACT | 0 refills | Status: DC | PRN

## 2024-10-06 MED ORDER — PREDNISONE 10 MG (21) PO TBPK: ORAL_TABLET | Freq: Every day | ORAL | 0 refills | Status: DC

## 2024-10-06 MED ORDER — AZITHROMYCIN 250 MG PO TABS: 250.0000 mg | ORAL_TABLET | Freq: Every day | ORAL | 0 refills | Status: DC

## 2024-10-06 NOTE — Telephone Encounter (Signed)
 FYI Only or Action Required?: FYI only for provider: patient will be going to urgent care today.  Patient was last seen in primary care on 07/07/2024 by Abbey Bruckner, MD.  Called Nurse Triage reporting Cough.  Symptoms began 3 days ago.  Interventions attempted: Rest, hydration, or home remedies.  Symptoms are: unchanged.  Triage Disposition: See HCP Within 4 Hours (Or PCP Triage)  Patient/caregiver understands and will follow disposition?: Yes  Copied from CRM [id number]. Topic: Clinical - Red Word Triage >> Oct 06, 2024  8:46 AM Suzen RAMAN wrote: Red Word that prompted transfer to Nurse Triage: productive cough with discolored mucous ,extremely fatigue, requesting an appt Reason for Disposition  [1] MILD difficulty breathing (e.g., minimal/no SOB at rest, SOB with walking, pulse < 100) AND [2] still present when not coughing  Answer Assessment - Initial Assessment Questions Patient reports productive cough with yellow sputum for the past three days. Reports some shortness of breath at times. Endorses fatigue and congestion. No availability in the office today-appointments tomorrow are with another provider in office. Patient states she will go to urgent care today as she wouldn't be able to see her PCP.   1. ONSET: When did the cough begin?      3 days 2. SEVERITY: How bad is the cough today?      severe 3. SPUTUM: Describe the color of your sputum (e.g., none, dry cough; clear, white, yellow, green)     yellowish 4. HEMOPTYSIS: Are you coughing up any blood? If Yes, ask: How much? (e.g., flecks, streaks, tablespoons, etc.)     no 5. DIFFICULTY BREATHING: Are you having difficulty breathing? If Yes, ask: How bad is it? (e.g., mild, moderate, severe)      Not right now-reports some shortness of breath on Saturday and Sunday 6. FEVER: Do you have a fever? If Yes, ask: What is your temperature, how was it measured, and when did it start?     no 7. CARDIAC HISTORY:  Do you have any history of heart disease? (e.g., heart attack, congestive heart failure)      no 8. LUNG HISTORY: Do you have any history of lung disease?  (e.g., pulmonary embolus, asthma, emphysema)     no 9. PE RISK FACTORS: Do you have a history of blood clots? (or: recent major surgery, recent prolonged travel, bedridden)     no 10. OTHER SYMPTOMS: Do you have any other symptoms? (e.g., runny nose, wheezing, chest pain)       Fatigue, congestion, runny nose 12. TRAVEL: Have you traveled out of the country in the last month? (e.g., travel history, exposures)       no  Protocols used: Cough - Acute Productive-A-AH

## 2024-10-06 NOTE — Telephone Encounter (Signed)
 Recommend follow up with me if symptoms does not improve. I also reviewed urgent care note.   Luke Shade, MD

## 2024-10-06 NOTE — ED Provider Notes (Signed)
 [name]    CSN: [id number] Arrival date & time: 10/06/24  0941      History   Chief Complaint Chief Complaint  Patient presents with   Cough    HPI [name] is a 73 y.o. female.   Patient presents with 3-day history of productive cough with yellow phlegm, chest congestion and tightness.  No fever, shortness of breath, chest pain.  She has been treating her symptoms with OTC cold and sinus medications.  Patient was seen here on 08/30/2024; diagnosed with acute sinusitis; treated with Augmentin .  Patient states she does not feel like she completely recovered from this illness before her current symptoms started.  The history is provided by the patient and medical records.    Past Medical History:  Diagnosis Date   Actinic keratosis    Acute gout of foot 05/19/2016   Allergy    Anxiety Don't know exact dste   Arthritis    Basal cell carcinoma 2011   BCC nose   Bell palsy 02/04/2016   Left   Breast cancer (HCC)    Carpal tunnel syndrome on both sides 01/23/2017   Colon polyps    Complication of anesthesia    GERD (gastroesophageal reflux disease) 3-4 years ago   Had surgery October 2024   Hyperlipidemia    Hypertension 2005   On HCTZ dsily   IBS (irritable bowel syndrome)    Plantar fasciitis    Pneumonia    PONV (postoperative nausea and vomiting)    Pre-diabetes    Prediabetes    Urinary frequency 05/15/2024    Patient Active Problem List   Diagnosis Date Noted   Low serum vitamin B12 07/07/2024   Stage 3a chronic kidney disease (HCC) 07/07/2024   Calcium  oxalate crystals in urine 05/19/2024   Carpal tunnel syndrome on both sides 05/15/2024   Cystocele, unspecified 05/15/2024   Bladder spasm 05/15/2024   Asymptomatic varicose veins of bilateral lower extremities 04/04/2024   Medication management 04/04/2024   Hyperlipidemia, mixed 04/04/2024   Postnasal drip 01/07/2024   Chronic rhinitis 01/07/2024   Hypertrophy of nasal turbinates  01/07/2024   Gastroesophageal reflux disease without esophagitis 09/20/2023   Hiatal hernia 09/20/2023   Hiatal hernia with GERD 09/20/2023   Ductal carcinoma in situ (DCIS) of left breast 11/17/2019   Family history of cardiac disorder in mother 05/26/2019   Degeneration of lumbar intervertebral disc 01/11/2018   Fatty liver (2005 by U/S) 09/23/2017   Prediabetes 05/19/2016   BMI 25.0-25.9,adult 05/19/2016   History of Bell's palsy 02/04/2016   Carpal tunnel syndrome on right 10/26/2015   Environmental allergies    Anxiety    Essential hypertension 11/10/2013    Past Surgical History:  Procedure Laterality Date   ABDOMINAL HYSTERECTOMY  11/27/1988   basal cell carcinoma  11/27/2009   nose   BREAST LUMPECTOMY WITH RADIOACTIVE SEED LOCALIZATION Left 12/05/2019   Procedure: LEFT BREAST LUMPECTOMY WITH RADIOACTIVE SEED LOCALIZATION;  Surgeon: Ethyl Lenis, MD;  Location: [facility];  Service: General;  Laterality: Left;   BREAST SURGERY  2021   Partial mastectomy   ESOPHAGOGASTRODUODENOSCOPY N/A 09/20/2023   Procedure: ESOPHAGOGASTRODUODENOSCOPY (EGD);  Surgeon: San Sandor GAILS, DO;  Location: [facility];  Service: Gastroenterology;  Laterality: N/A;   HERNIA REPAIR  October 2024   HIATAL HERNIA REPAIR N/A 09/20/2023   Procedure: LAPAROSCOPIC REPAIR OF SLIDING HIATAL HERNIA;  Surgeon: Tanda Locus, MD;  Location: [facility];  Service: General;  Laterality: N/A;  MASTECTOMY, PARTIAL Left    SAVORY DILATION  09/20/2023   Procedure: SAVORY DILATION;  Surgeon: San Sandor GAILS, DO;  Location: [facility];  Service: Gastroenterology;;   TENDON REPAIR Bilateral    plantar fasciitis   TONSILLECTOMY     TRANSORAL INCISIONLESS FUNDOPLICATION N/A 09/20/2023   Procedure: TRANSORAL INCISIONLESS FUNDOPLICATION;  Surgeon: San Sandor GAILS, DO;  Location: [facility];  Service: Gastroenterology;  Laterality: N/A;   TUBAL LIGATION  1999    OB History   No obstetric history on file.       Home Medications    Prior to Admission medications   Medication Sig Start Date End Date Taking? Authorizing Provider  albuterol  (VENTOLIN  HFA) 108 (90 Base) MCG/ACT inhaler Inhale 1-2 puffs into the lungs every 6 (six) hours as needed. 10/06/24  Yes Corlis Burnard DEL, NP  azithromycin  (ZITHROMAX ) 250 MG tablet Take 1 tablet (250 mg total) by mouth daily. Take first 2 tablets together, then 1 every day until finished. 10/06/24  Yes Corlis Burnard DEL, NP  predniSONE  (STERAPRED UNI-PAK 21 TAB) 10 MG (21) TBPK tablet Take by mouth daily. As directed 10/06/24  Yes Corlis Burnard DEL, NP  allopurinol  (ZYLOPRIM ) 300 MG tablet TAKE 1 TABLET BY MOUTH ONCE DAILY FOR  GOUT  PREVENTION 04/06/24   Bair, Kalpana, MD  Ascorbic Acid (VITAMIN C) 1000 MG tablet Take 1,000 mg by mouth daily.    [provider]  Cyanocobalamin  (VITAMIN B-12 PO) Take 1,000 mcg by mouth daily.    [provider]  fenofibrate  (TRICOR ) 145 MG tablet Take  1 tablet  Daily  for Triglycerides (Blood Fats)                                           /                                                                   TAKE                                         BY                                                 MOUTH      ? ? ?  ONCE ?? ? ? DAILY? ? ? 04/06/24   Abbey Bruckner, MD  metFORMIN  (GLUCOPHAGE -XR) 500 MG 24 hr tablet Take 1 tablet (500 mg total) by mouth daily. 04/06/24   Bair, Kalpana, MD  pravastatin  (PRAVACHOL ) 20 MG tablet Take  1 tablet  3 x / week  Mon Wed Fri for Cholesterol                               /  TAKE                                         BY                                                 MOUTH 04/06/24   Bair, Kalpana, MD  promethazine -dextromethorphan (PROMETHAZINE -DM) 6.25-15 MG/5ML syrup Take 5 mLs by mouth 4 (four) times daily as needed. Patient not taking: Reported on 10/06/2024 08/30/24   Corlis Burnard DEL, NP  pyridOXINE (VITAMIN B-6) 100 MG  tablet Take 100 mg by mouth daily.    [provider]  triamterene -hydrochlorothiazide  (MAXZIDE -25) 37.5-25 MG tablet Take 1/2 to 1 tablet Daily for BP & Fluid Retention 04/06/24   Bair, Kalpana, MD  zinc gluconate 50 MG tablet Take 50 mg by mouth daily.    [provider]    Family History Family History  Problem Relation Age of Onset   Hypertension Mother    Heart attack Mother    Hyperlipidemia Mother    Heart disease Mother 63       fatal MI   Stroke Mother    Arthritis Mother        rheumatoid   Cancer Father        lung   Arthritis Father    Hypertension Brother    Cancer Brother        throat, leukemia   Parkinson's disease Brother    Arthritis Brother     Social History Social History   Tobacco Use   Smoking status: Never   Smokeless tobacco: Never  Vaping Use   Vaping status: Never Used  Substance Use Topics   Alcohol use: Yes    Comment: rare   Drug use: Never     Allergies   Dexamethasone    Review of Systems Review of Systems  Constitutional:  Negative for chills and fever.  HENT:  Positive for congestion, postnasal drip and rhinorrhea. Negative for ear pain and sore throat.   Respiratory:  Positive for cough. Negative for shortness of breath.   Cardiovascular:  Negative for chest pain and palpitations.     Physical Exam Triage Vital Signs ED Triage Vitals  Encounter Vitals Group     BP 10/06/24 0954 (!) 144/80     Girls Systolic BP Percentile --      Girls Diastolic BP Percentile --      Boys Systolic BP Percentile --      Boys Diastolic BP Percentile --      Pulse Rate 10/06/24 0954 98     Resp 10/06/24 0954 (!) 21     Temp 10/06/24 0954 98 F (36.7 C)     Temp src --      SpO2 10/06/24 0954 97 %     Weight --      Height --      Head Circumference --      Peak Flow --      Pain Score 10/06/24 0952 4     Pain Loc --      Pain Education --      Exclude from Growth Chart --    No data found.  Updated Vital  Signs BP (!) 144/80   Pulse 98  Temp 98 F (36.7 C)   Resp (!) 21   SpO2 97%   Visual Acuity Right Eye Distance:   Left Eye Distance:   Bilateral Distance:    Right Eye Near:   Left Eye Near:    Bilateral Near:     Physical Exam Constitutional:      General: She is not in acute distress. HENT:     Right Ear: Tympanic membrane normal.     Left Ear: Tympanic membrane normal.     Nose: Rhinorrhea present.     Mouth/Throat:     Mouth: Mucous membranes are moist.     Pharynx: Oropharynx is clear.  Cardiovascular:     Rate and Rhythm: Normal rate and regular rhythm.     Heart sounds: Normal heart sounds.  Pulmonary:     Effort: Pulmonary effort is normal. No respiratory distress.     Breath sounds: Normal breath sounds.     Comments: Frequent wet-sounding cough. Neurological:     Mental Status: She is alert.      UC Treatments / Results  Labs (all labs ordered are listed, but only abnormal results are displayed) Labs Reviewed - No data to display  EKG   Radiology No results found.  Procedures Procedures (including critical care time)  Medications Ordered in UC Medications - No data to display  Initial Impression / Assessment and Plan / UC Course  I have reviewed the triage vital signs and the nursing notes.  Pertinent labs & imaging results that were available during my care of the patient were reviewed by me and considered in my medical decision making (see chart for details).    Acute upper respiratory infection.  O2 sat 97% on room air.  No respiratory distress.  Patient does not feel that her symptoms completely cleared from her illness 1 month ago.  Treating today with albuterol  inhaler, prednisone , Zithromax .  Instructed her to follow-up with her PCP tomorrow.  ED precautions given.  Education provided on acute upper respiratory infection.  Patient agrees to plan of care.  Final Clinical Impressions(s) / UC Diagnoses   Final diagnoses:  Acute upper  respiratory infection     Discharge Instructions      Take the prednisone  and Zithromax  as directed.  Use the albuterol  inhaler as directed.  Follow up with your primary care provider tomorrow.  Go to the emergency department if you have worsening symptoms.        ED Prescriptions     Medication Sig Dispense Auth. Provider   albuterol  (VENTOLIN  HFA) 108 (90 Base) MCG/ACT inhaler Inhale 1-2 puffs into the lungs every 6 (six) hours as needed. 18 g Corlis Burnard DEL, NP   predniSONE  (STERAPRED UNI-PAK 21 TAB) 10 MG (21) TBPK tablet Take by mouth daily. As directed 21 tablet Corlis Burnard DEL, NP   azithromycin  (ZITHROMAX ) 250 MG tablet Take 1 tablet (250 mg total) by mouth daily. Take first 2 tablets together, then 1 every day until finished. 6 tablet Corlis Burnard DEL, NP      PDMP not reviewed this encounter.   Corlis Burnard DEL, NP 10/06/24 1053

## 2024-10-06 NOTE — ED Triage Notes (Signed)
 Patient to Urgent Care with complaints of productive cough (yellow/ clear)/ chest congestion and tightness.   Symptoms x3 days.   Meds: nyquil/ dayquil/ sudafed severe cold

## 2024-10-06 NOTE — Discharge Instructions (Addendum)
 Take the prednisone  and Zithromax  as directed.  Use the albuterol  inhaler as directed.  Follow up with your primary care provider tomorrow.  Go to the emergency department if you have worsening symptoms.

## 2024-10-13 ENCOUNTER — Other Ambulatory Visit (INDEPENDENT_AMBULATORY_CARE_PROVIDER_SITE_OTHER)

## 2024-10-13 DIAGNOSIS — E538 Deficiency of other specified B group vitamins: Secondary | ICD-10-CM

## 2024-10-13 DIAGNOSIS — R7303 Prediabetes: Secondary | ICD-10-CM | POA: Diagnosis not present

## 2024-10-13 DIAGNOSIS — E782 Mixed hyperlipidemia: Secondary | ICD-10-CM

## 2024-10-13 DIAGNOSIS — I10 Essential (primary) hypertension: Secondary | ICD-10-CM

## 2024-10-13 LAB — COMPREHENSIVE METABOLIC PANEL WITH GFR
ALT: 33 U/L (ref 0–35)
AST: 28 U/L (ref 0–37)
Albumin: 4.4 g/dL (ref 3.5–5.2)
Alkaline Phosphatase: 71 U/L (ref 39–117)
BUN: 26 mg/dL — ABNORMAL HIGH (ref 6–23)
CO2: 30 meq/L (ref 19–32)
Calcium: 9.6 mg/dL (ref 8.4–10.5)
Chloride: 96 meq/L (ref 96–112)
Creatinine, Ser: 1.01 mg/dL (ref 0.40–1.20)
GFR: 55.1 mL/min — ABNORMAL LOW (ref >60.00)
Glucose, Bld: 102 mg/dL — ABNORMAL HIGH (ref 70–99)
Potassium: 3.8 meq/L (ref 3.5–5.1)
Sodium: 137 meq/L (ref 135–145)
Total Bilirubin: 0.8 mg/dL (ref 0.2–1.2)
Total Protein: 6.9 g/dL (ref 6.0–8.3)

## 2024-10-13 LAB — VITAMIN B12: Vitamin B-12: 389 pg/mL (ref 211–911)

## 2024-10-13 LAB — MICROALBUMIN / CREATININE URINE RATIO
Creatinine,U: 111.4 mg/dL
Microalb Creat Ratio: UNDETERMINED mg/g (ref 0.0–30.0)
Microalb, Ur: <0.7 mg/dL

## 2024-10-14 ENCOUNTER — Ambulatory Visit: Payer: Self-pay

## 2024-10-14 DIAGNOSIS — J300 Vasomotor rhinitis: Secondary | ICD-10-CM | POA: Diagnosis not present

## 2024-10-14 DIAGNOSIS — K219 Gastro-esophageal reflux disease without esophagitis: Secondary | ICD-10-CM | POA: Diagnosis not present

## 2024-10-14 DIAGNOSIS — R053 Chronic cough: Secondary | ICD-10-CM | POA: Diagnosis not present

## 2024-10-14 LAB — HEMOGLOBIN A1C: Hgb A1c MFr Bld: 5.9 % (ref 4.6–6.5)

## 2024-10-14 NOTE — Progress Notes (Signed)
 Hold results to discuss during upcoming office visit.   Luke Shade, MD

## 2024-10-15 ENCOUNTER — Ambulatory Visit (INDEPENDENT_AMBULATORY_CARE_PROVIDER_SITE_OTHER)

## 2024-10-15 VITALS — BP 120/74 | HR 79 | Temp 98.6°F | Ht 67.0 in | Wt 160.4 lb

## 2024-10-15 DIAGNOSIS — I8393 Asymptomatic varicose veins of bilateral lower extremities: Secondary | ICD-10-CM

## 2024-10-15 DIAGNOSIS — E782 Mixed hyperlipidemia: Secondary | ICD-10-CM

## 2024-10-15 DIAGNOSIS — M1A9XX0 Chronic gout, unspecified, without tophus (tophi): Secondary | ICD-10-CM | POA: Diagnosis not present

## 2024-10-15 DIAGNOSIS — I10 Essential (primary) hypertension: Secondary | ICD-10-CM

## 2024-10-15 DIAGNOSIS — R7303 Prediabetes: Secondary | ICD-10-CM | POA: Diagnosis not present

## 2024-10-15 DIAGNOSIS — E538 Deficiency of other specified B group vitamins: Secondary | ICD-10-CM | POA: Diagnosis not present

## 2024-10-15 DIAGNOSIS — N1831 Chronic kidney disease, stage 3a: Secondary | ICD-10-CM

## 2024-10-15 DIAGNOSIS — E663 Overweight: Secondary | ICD-10-CM | POA: Diagnosis not present

## 2024-10-15 MED ORDER — PHENTERMINE HCL 37.5 MG PO CAPS
37.5000 mg | ORAL_CAPSULE | ORAL | 0 refills | Status: AC
Start: 2024-10-15 — End: ?

## 2024-10-15 NOTE — Assessment & Plan Note (Signed)
 Chronic, stable and asymptomatic. Continue to monitor.

## 2024-10-15 NOTE — Assessment & Plan Note (Signed)
 Reviewed recent CMP, renal function stable. Reviewed recent urine microalbumin which was normal. Suspect this to be secondary to hypertension. Discussed importance of hydration and avoiding NSAIDs. Emphasized monitoring kidney function and potential referral to nephrologist if function declines.

## 2024-10-15 NOTE — Assessment & Plan Note (Addendum)
 Stable on Pravastatin  20 mg, 3 days a week, continue. Repeat fasting lipid panel in 4 months. Reviewed hepatic function from recent CMP, which is stable.  Orders:   Lipid panel; Future

## 2024-10-15 NOTE — Progress Notes (Signed)
 Discussed during OV.  Jacklin Mascot, MD

## 2024-10-15 NOTE — Assessment & Plan Note (Addendum)
 Chronic, which is now improved with oral B12 supplement evident on recent B12 lab. Recommend she continues to take B12 1000 mcg daily. Will repeat B12 during her next lab.  Orders:   B12; Future

## 2024-10-15 NOTE — Assessment & Plan Note (Addendum)
 Patient's weight during her last visit was 158 pounds, weight today is 160 lb and 6.4 oz.  Previously she took Phentamine and noted improvement in weight.  Discussed phentermine  for weight management, considering hypertension and hyperlipidemia. Emphasized lifestyle modifications. Discussed risks of phentermine , including increased risk of stroke, heart attack, and heart rhythm problems. Encouraged monitoring of blood pressure and stopping medication if significant changes occur. She is aware of potential s/e and prefers to take this medication.   Prescribed phentermine  37.5 mg, take it daily before breakfast. Recommend this in adjunct with calorie deficit diet, regular exercise.   Instructed to monitor blood pressure daily and stop medication if systolic increases by 20 mmHg or diastolic by 10 mmHg.  Encouraged DASH diet and regular exercise.  Encouraged participation in [employer] program Orders:   phentermine  (ADIPEX-P ) 37.5 MG capsule; Take 1 capsule (37.5 mg total) by mouth every morning.

## 2024-10-15 NOTE — Progress Notes (Signed)
 Established Patient Office Visit   Subjective  Patient ID: [name], female    DOB: May 31, 1951  Age: 73 y.o. MRN: [id number]  Chief Complaint  Patient presents with   Hypertension   Hyperlipidemia    Discussed the use of AI scribe software for clinical note transcription with the patient, who gave verbal consent to proceed.  History of Present Illness [name] is a 73 year old female who presents for a follow-up visit to review lab results and discuss management of her conditions.  She is experiencing confusion regarding the discrepancy between her A1c and blood glucose levels. Her A1c has shown slight improvement. She is currently taking metformin  500 mg once daily.   Her weight has increased by 2 pound which is stressing her and she is considering options to manage this. Previously managed with Phentermine  and is interested in restarting it.   She takes Tylenol  or Aleve  for foot pain due to uncomfortable shoes but generally avoids NSAIDs. She focuses on staying hydrated and has ordered a magnetic chart to remind her of kidney-friendly practices.  Her medication regimen includes allopurinol  300 mg daily for hyperuricemia, vitamin C, B12 supplements, fenofibrate , metformin , pravastatin  three times a week, vitamin B6, and a daily blood pressure medication. She occasionally takes zinc. She has a history of gout affecting her left ankle and right big toe, which was previously severe but is now managed with allopurinol .  No symptoms of diabetic neuropathy, such as numbness or tingling in her extremities. She has a history of elevated blood pressure and monitors it at home, checking it when she feels it might be elevated.  She has a history of low serum B12, which has improved with supplementation. She underwent surgery for stomach reflux last year, which has resolved her symptoms. She is concerned about her weight gain and is considering lifestyle changes to address this, especially  with the upcoming holiday season.    ROS As per HPI    Objective:     BP 120/74 (BP Location: Right Arm, Patient Position: Sitting, Cuff Size: Normal)   Pulse 79   Temp 98.6 F (37 C) (Oral)   Ht 5' 7 (1.702 m)   Wt 160 lb 6.4 oz (72.8 kg)   SpO2 97%   BMI 25.12 kg/m      10/15/2024    8:12 AM 07/07/2024    8:08 AM 05/15/2024    1:15 PM  Depression screen PHQ 2/9  Decreased Interest 0 0 0  Down, Depressed, Hopeless 0 0 0  PHQ - 2 Score 0 0 0  Altered sleeping 0 0 0  Tired, decreased energy 0 0 0  Change in appetite 0 0 0  Feeling bad or failure about yourself  0 0 0  Trouble concentrating 0 0 0  Moving slowly or fidgety/restless 0 0 0  Suicidal thoughts 0 0 0  PHQ-9 Score 0 0  0   Difficult doing work/chores Not difficult at all Not difficult at all Not difficult at all     Data saved with a previous flowsheet row definition      10/15/2024    8:13 AM 07/07/2024    8:09 AM 05/15/2024    1:16 PM 04/04/2024    9:09 AM  GAD 7 : Generalized Anxiety Score  Nervous, Anxious, on Edge 0 0 0 0  Control/stop worrying 0 0 0 0  Worry too much - different things 0 0 0 0  Trouble relaxing 0 0 0  0  Restless 0 0 0 0  Easily annoyed or irritable 0 0 0 0  Afraid - awful might happen 0 0 0 0  Total GAD 7 Score 0 0 0 0  Anxiety Difficulty Not difficult at all Not difficult at all Not difficult at all Not difficult at all      10/15/2024    8:12 AM 07/07/2024    8:08 AM 05/15/2024    1:15 PM  Depression screen PHQ 2/9  Decreased Interest 0 0 0  Down, Depressed, Hopeless 0 0 0  PHQ - 2 Score 0 0 0  Altered sleeping 0 0 0  Tired, decreased energy 0 0 0  Change in appetite 0 0 0  Feeling bad or failure about yourself  0 0 0  Trouble concentrating 0 0 0  Moving slowly or fidgety/restless 0 0 0  Suicidal thoughts 0 0 0  PHQ-9 Score 0 0  0   Difficult doing work/chores Not difficult at all Not difficult at all Not difficult at all     Data saved with a previous flowsheet  row definition      10/15/2024    8:13 AM 07/07/2024    8:09 AM 05/15/2024    1:16 PM 04/04/2024    9:09 AM  GAD 7 : Generalized Anxiety Score  Nervous, Anxious, on Edge 0 0 0 0  Control/stop worrying 0 0 0 0  Worry too much - different things 0 0 0 0  Trouble relaxing 0 0 0 0  Restless 0 0 0 0  Easily annoyed or irritable 0 0 0 0  Afraid - awful might happen 0 0 0 0  Total GAD 7 Score 0 0 0 0  Anxiety Difficulty Not difficult at all Not difficult at all Not difficult at all Not difficult at all   SDOH Screenings   Food Insecurity: No Food Insecurity (10/13/2024)  Housing: Unknown (10/13/2024)  Transportation Needs: No Transportation Needs (10/13/2024)  Utilities: Not At Risk (09/21/2023)  Alcohol Screen: Low Risk  (10/13/2024)  Depression (PHQ2-9): Low Risk  (10/15/2024)  Financial Resource Strain: Low Risk  (10/13/2024)  Physical Activity: Insufficiently Active (10/13/2024)  Social Connections: Socially Integrated (10/13/2024)  Stress: No Stress Concern Present (10/13/2024)  Tobacco Use: Low Risk  (10/15/2024)     Physical Exam Constitutional:      General: She is not in acute distress.    Appearance: Normal appearance. She is not toxic-appearing.  HENT:     Head: Normocephalic and atraumatic.     Left Ear: Tympanic membrane normal.     Mouth/Throat:     Mouth: Mucous membranes are moist.  Neck:     Thyroid : No thyroid  mass or thyroid  tenderness.  Cardiovascular:     Rate and Rhythm: Normal rate and regular rhythm.  Pulmonary:     Effort: Pulmonary effort is normal.     Breath sounds: Normal breath sounds. No wheezing.  Abdominal:     General: Bowel sounds are normal.     Palpations: Abdomen is soft.     Tenderness: There is no abdominal tenderness. There is no guarding.  Musculoskeletal:     Cervical back: Neck supple. No rigidity or tenderness.     Right lower leg: No edema.     Left lower leg: No edema.  Skin:    General: Skin is warm.  Neurological:      Mental Status: She is alert and oriented to person, place, and time.  Psychiatric:  Mood and Affect: Mood normal.        Behavior: Behavior normal.        No results found for any visits on 10/15/24.  The 10-year ASCVD risk score (Arnett DK, et al., 2019) is: 14.6%     Following lab results discussed:  Component     Latest Ref Rng 10/13/2024  Sodium     135 - 145 mEq/L 137   Potassium     3.5 - 5.1 mEq/L 3.8   Chloride     96 - 112 mEq/L 96   CO2     19 - 32 mEq/L 30   Glucose     70 - 99 mg/dL 897 (H)   BUN     6 - 23 mg/dL 26 (H)   Creatinine     0.40 - 1.20 mg/dL 8.98   Total Bilirubin     0.2 - 1.2 mg/dL 0.8   Alkaline Phosphatase     39 - 117 U/L 71   AST     0 - 37 U/L 28   ALT     0 - 35 U/L 33   Total Protein     6.0 - 8.3 g/dL 6.9   Albumin     3.5 - 5.2 g/dL 4.4   GFR     >39.99 mL/min 55.10 (L)   Calcium      8.4 - 10.5 mg/dL 9.6   Microalb, Ur     mg/dL <9.2   Creatinine,U     mg/dL 888.5   MICROALB/CREAT RATIO     0.0 - 30.0 mg/g Unable to calculate   Vitamin B12     211 - 911 pg/mL 389   Hemoglobin A1C     4.6 - 6.5 % 5.9      Assessment & Plan:   Assessment & Plan Overweight (BMI 25.0-29.9) Patient's weight during her last visit was 158 pounds, weight today is 160 lb and 6.4 oz.  Previously she took Phentamine and noted improvement in weight.  Discussed phentermine  for weight management, considering hypertension and hyperlipidemia. Emphasized lifestyle modifications. Discussed risks of phentermine , including increased risk of stroke, heart attack, and heart rhythm problems. Encouraged monitoring of blood pressure and stopping medication if significant changes occur. She is aware of potential s/e and prefers to take this medication.   Prescribed phentermine  37.5 mg, take it daily before breakfast. Recommend this in adjunct with calorie deficit diet, regular exercise.   Instructed to monitor blood pressure daily and stop  medication if systolic increases by 20 mmHg or diastolic by 10 mmHg.  Encouraged DASH diet and regular exercise.  Encouraged participation in [employer] program Orders:   phentermine  (ADIPEX-P ) 37.5 MG capsule; Take 1 capsule (37.5 mg total) by mouth every morning.  Low serum vitamin B12 Chronic, which is now improved with oral B12 supplement evident on recent B12 lab. Recommend she continues to take B12 1000 mcg daily. Will repeat B12 during her next lab.  Orders:   B12; Future  Chronic gout involving toe of right foot without tophus, unspecified cause Previous gout attack involving multiple joints. Stable on allopurinol  300 mg daily, continue. Check uric acid level with next lab. Goal uric acid level <6.5 Orders:   Uric acid; Future  Essential hypertension BP stable today. Continue triamterene -hydrochlorothiazide  37.5-25 mg (1/2 tablet) daily. CMP from recent lab reviewed which is stable. Provided printed information on DASH diet.  Orders:   Comp Met (CMET); Future  Hyperlipidemia, mixed Stable on Pravastatin   20 mg, 3 days a week, continue. Repeat fasting lipid panel in 4 months. Reviewed hepatic function from recent CMP, which is stable.  Orders:   Lipid panel; Future  Prediabetes Recent A1c stable, continue Metformin  500 mg daily. Repeat A1c in 4 months. Encourage continue reduction of sugar intake, regular exercise.  Orders:   HgB A1c; Future  Asymptomatic varicose veins of bilateral lower extremities Chronic, stable and asymptomatic. Continue to monitor.     Stage 3a chronic kidney disease (HCC) Reviewed recent CMP, renal function stable. Reviewed recent urine microalbumin which was normal. Suspect this to be secondary to hypertension. Discussed importance of hydration and avoiding NSAIDs. Emphasized monitoring kidney function and potential referral to nephrologist if function declines.     I personally spent a total of 40 minutes in the care of the patient today  including preparing to see the patient, counseling and educating, placing orders, documenting clinical information in the EHR, independently interpreting results, and communicating results.  Return in about 4 months (around 02/12/2025) for chronic follow up, fasting labs 2 days before apt.   Luke Shade, MD

## 2024-10-15 NOTE — Assessment & Plan Note (Addendum)
 BP stable today. Continue triamterene -hydrochlorothiazide  37.5-25 mg (1/2 tablet) daily. CMP from recent lab reviewed which is stable. Provided printed information on DASH diet.  Orders:   Comp Met (CMET); Future

## 2024-10-15 NOTE — Patient Instructions (Addendum)
-   Start Adipex-p   37.5 mg daily before breakfast for 3 months. Please monitor your BP and stop taking this medication if your top number increases by 20 mmHg or bottom number increases by 10 mmHg. If you develop chest pain, palpitations please stop this medication and seek medical care right away.   - Follow up in 3-4 months, repeat labs couple of days before your appointment.   - Avoid NSAIDs (Ibuprofen, Advil), if you must take them, take it for 2-3 days in a row with food.

## 2024-10-15 NOTE — Assessment & Plan Note (Addendum)
 Recent A1c stable, continue Metformin  500 mg daily. Repeat A1c in 4 months. Encourage continue reduction of sugar intake, regular exercise.  Orders:   HgB A1c; Future

## 2024-10-28 ENCOUNTER — Ambulatory Visit: Admitting: Dermatology

## 2024-11-07 ENCOUNTER — Encounter: Payer: Self-pay | Admitting: Pharmacist

## 2024-11-07 NOTE — Progress Notes (Signed)
 Pharmacy Quality Measure Review  This patient is appearing on a report for being at risk of failing the adherence measure for diabetes medications this calendar year.   Medication: metformin  500 mg  Last fill date: 07/02/24 for 90 day supply  Insurance report was not up to date. No action needed at this time.  Medication refilled as of 09/28/24 x90 ds

## 2024-11-10 ENCOUNTER — Ambulatory Visit: Admitting: Dermatology

## 2024-11-10 DIAGNOSIS — L821 Other seborrheic keratosis: Secondary | ICD-10-CM

## 2024-11-10 DIAGNOSIS — L570 Actinic keratosis: Secondary | ICD-10-CM

## 2024-11-10 DIAGNOSIS — L578 Other skin changes due to chronic exposure to nonionizing radiation: Secondary | ICD-10-CM

## 2024-11-10 DIAGNOSIS — L820 Inflamed seborrheic keratosis: Secondary | ICD-10-CM

## 2024-11-10 NOTE — Patient Instructions (Addendum)
 Seborrheic Keratosis  What causes seborrheic keratoses? Seborrheic keratoses are harmless, common skin growths that first appear during adult life.  As time goes by, more growths appear.  Some people may develop a large number of them.  Seborrheic keratoses appear on both covered and uncovered body parts.  They are not caused by sunlight.  The tendency to develop seborrheic keratoses can be inherited.  They vary in color from skin-colored to gray, brown, or even black.  They can be either smooth or have a rough, warty surface.   Seborrheic keratoses are superficial and look as if they were stuck on the skin.  Under the microscope this type of keratosis looks like layers upon layers of skin.  That is why at times the top layer may seem to fall off, but the rest of the growth remains and re-grows.    Treatment Seborrheic keratoses do not need to be treated, but can easily be removed in the office.  Seborrheic keratoses often cause symptoms when they rub on clothing or jewelry.  Lesions can be in the way of shaving.  If they become inflamed, they can cause itching, soreness, or burning.  Removal of a seborrheic keratosis can be accomplished by freezing, burning, or surgery. If any spot bleeds, scabs, or grows rapidly, please return to have it checked, as these can be an indication of a skin cancer.   Cryotherapy Aftercare  Wash gently with soap and water everyday.   Apply Vaseline and Band-Aid daily until healed.     Due to recent changes in healthcare laws, you may see results of your pathology and/or laboratory studies on MyChart before the doctors have had a chance to review them. We understand that in some cases there may be results that are confusing or concerning to you. Please understand that not all results are received at the same time and often the doctors may need to interpret multiple results in order to provide you with the best plan of care or course of treatment. Therefore, we ask  that you please give us  2 business days to thoroughly review all your results before contacting the office for clarification. Should we see a critical lab result, you will be contacted sooner.   If You Need Anything After Your Visit  If you have any questions or concerns for your doctor, please call our main line at [phone] and press option 4 to reach your doctor's medical assistant. If no one answers, please leave a voicemail as directed and we will return your call as soon as possible. Messages left after 4 pm will be answered the following business day.   You may also send us  a message via MyChart. We typically respond to MyChart messages within 1-2 business days.  For prescription refills, please ask your pharmacy to contact our office. Our fax number is [phone].  If you have an urgent issue when the clinic is closed that cannot wait until the next business day, you can page your doctor at the number below.    Please note that while we do our best to be available for urgent issues outside of office hours, we are not available 24/7.   If you have an urgent issue and are unable to reach us , you may choose to seek medical care at your doctor's office, retail clinic, urgent care center, or emergency room.  If you have a medical emergency, please immediately call 911 or go to the emergency department.  Pager Numbers  - Dr. Hester:  [phone]  - Dr. Jackquline: [phone]  - Dr. Claudene: [phone]   - Dr. Raymund: [phone]  In the event of inclement weather, please call our main line at [phone] for an update on the status of any delays or closures.  Dermatology Medication Tips: Please keep the boxes that topical medications come in in order to help keep track of the instructions about where and how to use these. Pharmacies typically print the medication instructions only on the boxes and not directly on the medication tubes.   If your medication is too expensive,  please contact our office at [phone] option 4 or send us  a message through MyChart.   We are unable to tell what your co-pay for medications will be in advance as this is different depending on your insurance coverage. However, we may be able to find a substitute medication at lower cost or fill out paperwork to get insurance to cover a needed medication.   If a prior authorization is required to get your medication covered by your insurance company, please allow us  1-2 business days to complete this process.  Drug prices often vary depending on where the prescription is filled and some pharmacies may offer cheaper prices.  The website www.goodrx.com contains coupons for medications through different pharmacies. The prices here do not account for what the cost may be with help from insurance (it may be cheaper with your insurance), but the website can give you the price if you did not use any insurance.  - You can print the associated coupon and take it with your prescription to the pharmacy.  - You may also stop by our office during regular business hours and pick up a GoodRx coupon card.  - If you need your prescription sent electronically to a different pharmacy, notify our office through [facility] or by phone at [phone] option 4.     Si Usted Necesita Algo Despus de Su Visita  Tambin puede enviarnos un mensaje a travs de Clinical cytogeneticist. Por lo general respondemos a los mensajes de MyChart en el transcurso de 1 a 2 das hbiles.  Para renovar recetas, por favor pida a su farmacia que se ponga en contacto con nuestra oficina. Randi lakes de fax es [place] [phone].  Si tiene un asunto urgente cuando la clnica est cerrada y que no puede esperar hasta el siguiente da hbil, puede llamar/localizar a su doctor(a) al nmero que aparece a continuacin.   Por favor, tenga en cuenta que aunque hacemos todo lo posible para estar disponibles para asuntos urgentes fuera del  horario de [place], no estamos disponibles las 24 horas del da, los 7 [address]  [address] de la [place].   Si tiene un problema urgente y no puede comunicarse con nosotros, puede optar por buscar atencin mdica  en el consultorio de su doctor(a), en una clnica privada, en un centro de atencin urgente o en una sala de emergencias.  Si tiene Engineer, drilling, por favor llame inmediatamente al 911 o vaya a la sala de emergencias.  Nmeros de bper  - Dr. Hester: [phone]  - Dra. Jackquline: [phone]  - Dr. Claudene: [phone]  - Dra. Kitts: [phone]  En caso de inclemencias del [place], por favor llame a nuestra lnea principal al [phone] para una actualizacin sobre el estado de cualquier retraso o cierre.  Consejos para la medicacin en dermatologa: Por favor, guarde las cajas en las que vienen los medicamentos de uso tpico para ayudarle a seguir las instrucciones sobre dnde y cmo  usarlos. Las farmacias generalmente imprimen las instrucciones del medicamento slo en las cajas y no directamente en los tubos del [place].   Si su medicamento es muy caro, por favor, pngase en contacto con landry rieger llamando al [phone] y presione la opcin 4 o envenos un mensaje a travs de Clinical cytogeneticist.   No podemos decirle cul ser su copago por los medicamentos por adelantado ya que esto es diferente dependiendo de la cobertura de su seguro. Sin embargo, es posible que podamos encontrar un medicamento sustituto a Audiological scientist un formulario para que el seguro cubra el medicamento que se considera necesario.   Si se requiere una autorizacin previa para que su compaa de seguros malta su medicamento, por favor permtanos de 1 a 2 das hbiles para completar este proceso.  Los precios de los medicamentos varan con frecuencia dependiendo del Environmental consultant de dnde se surte la receta y alguna farmacias pueden ofrecer precios ms baratos.  El sitio web www.goodrx.com tiene cupones para  medicamentos de Health and safety inspector. Los precios aqu no tienen en cuenta lo que podra costar con la ayuda del seguro (puede ser ms barato con su seguro), pero el sitio web puede darle el precio si no utiliz Tourist information centre manager.  - Puede imprimir el cupn correspondiente y llevarlo con su receta a la farmacia.  - Tambin puede pasar por nuestra oficina durante el horario de atencin regular y Education officer, museum una tarjeta de cupones de GoodRx.  - Si necesita que su receta se enve electrnicamente a una farmacia diferente, informe a nuestra oficina a travs de MyChart de [place] o por telfono llamando al [phone] y presione la opcin 4.

## 2024-11-10 NOTE — Progress Notes (Unsigned)
 Follow-Up Visit   Subjective  [name] is a 73 y.o. female who presents for the following: 6 week follow up of isks  Patient mentions spot at neck and arms  The patient has spots, moles and lesions to be evaluated, some may be new or changing and the patient may have concern these could be cancer.  The following portions of the chart were reviewed this encounter and updated as appropriate: medications, allergies, medical history  Review of Systems:  No other skin or systemic complaints except as noted in HPI or Assessment and Plan.  Objective  Well appearing patient in no apparent distress; mood and affect are within normal limits.   A focused examination was performed of the following areas: Chest , neck , face   Relevant exam findings are noted in the Assessment and Plan.  hands and arms x 37 (37) Erythematous stuck-on, waxy papule or plaque right chest x 1 Erythematous thin papules/macules with gritty scale.   Assessment & Plan   SEBORRHEIC KERATOSIS - Stuck-on, waxy, tan-brown papules and/or plaques  - Benign-appearing - Discussed benign etiology and prognosis. - Observe - Call for any changes  ACTINIC DAMAGE - chronic, secondary to cumulative UV radiation exposure/sun exposure over time - diffuse scaly erythematous macules with underlying dyspigmentation - Recommend daily broad spectrum sunscreen SPF 30+ to sun-exposed areas, reapply every 2 hours as needed.  - Recommend staying in the shade or wearing long sleeves, sun glasses (UVA+UVB protection) and wide brim hats (4-inch brim around the entire circumference of the hat). - Call for new or changing lesions.  INFLAMED SEBORRHEIC KERATOSIS (37) hands and arms x 37 (37) Symptomatic, irritating, patient would like treated. - Destruction of lesion - hands and arms x 37 (37) Complexity: simple   Destruction method: cryotherapy   Informed consent: discussed and consent obtained   Timeout:  patient name, date  of birth, surgical site, and procedure verified Lesion destroyed using liquid nitrogen: Yes   Region frozen until ice ball extended beyond lesion: Yes   Outcome: patient tolerated procedure well with no complications   Post-procedure details: wound care instructions given    ACTINIC KERATOSIS right chest x 1 Actinic keratoses are precancerous spots that appear secondary to cumulative UV radiation exposure/sun exposure over time. They are chronic with expected duration over 1 year. A portion of actinic keratoses will progress to squamous cell carcinoma of the skin. It is not possible to reliably predict which spots will progress to skin cancer and so treatment is recommended to prevent development of skin cancer.  Recommend daily broad spectrum sunscreen SPF 30+ to sun-exposed areas, reapply every 2 hours as needed.  Recommend staying in the shade or wearing long sleeves, sun glasses (UVA+UVB protection) and wide brim hats (4-inch brim around the entire circumference of the hat). Call for new or changing lesions. - Destruction of lesion - right chest x 1 Complexity: simple   Destruction method: cryotherapy   Informed consent: discussed and consent obtained   Timeout:  patient name, date of birth, surgical site, and procedure verified Lesion destroyed using liquid nitrogen: Yes   Region frozen until ice ball extended beyond lesion: Yes   Outcome: patient tolerated procedure well with no complications   Post-procedure details: wound care instructions given    SEBORRHEIC KERATOSIS   ACTINIC SKIN DAMAGE    Return in about 3 months (around 02/08/2025) for isk and ak followup.  IEleanor Blush, CMA, am acting as scribe for Alm Rhyme,  MD.   Documentation: I have reviewed the above documentation for accuracy and completeness, and I agree with the above.  Alm Rhyme, MD

## 2024-11-11 ENCOUNTER — Encounter: Payer: Self-pay | Admitting: Dermatology

## 2024-11-13 ENCOUNTER — Ambulatory Visit: Payer: PPO | Admitting: Nurse Practitioner

## 2024-11-17 ENCOUNTER — Encounter: Payer: Self-pay | Admitting: Pharmacist

## 2024-11-17 NOTE — Progress Notes (Signed)
 In error

## 2024-11-26 LAB — HM MAMMOGRAPHY

## 2024-11-28 ENCOUNTER — Telehealth: Payer: Self-pay

## 2024-11-28 NOTE — Telephone Encounter (Signed)
 Mammogram report was reviewed and scanned in. No mammographic evidence of malignancy.

## 2024-12-09 ENCOUNTER — Other Ambulatory Visit: Payer: Self-pay | Admitting: Family Medicine

## 2024-12-09 DIAGNOSIS — M25511 Pain in right shoulder: Secondary | ICD-10-CM

## 2024-12-17 ENCOUNTER — Other Ambulatory Visit

## 2024-12-19 ENCOUNTER — Other Ambulatory Visit

## 2024-12-23 ENCOUNTER — Ambulatory Visit
Admission: RE | Admit: 2024-12-23 | Discharge: 2024-12-23 | Disposition: A | Source: Ambulatory Visit | Attending: Family Medicine | Admitting: Family Medicine

## 2024-12-23 DIAGNOSIS — M25511 Pain in right shoulder: Secondary | ICD-10-CM

## 2025-02-04 ENCOUNTER — Ambulatory Visit: Admitting: Hematology and Oncology

## 2025-02-09 ENCOUNTER — Ambulatory Visit: Admitting: Dermatology

## 2025-02-13 ENCOUNTER — Other Ambulatory Visit

## 2025-02-17 ENCOUNTER — Ambulatory Visit
